# Patient Record
Sex: Female | Born: 1945 | Race: White | Hispanic: No | Smoking: Never smoker
Health system: Southern US, Community
[De-identification: ages and names within clinical notes are randomized; demographics above are authoritative.]

## PROBLEM LIST (undated history)

## (undated) DIAGNOSIS — M810 Age-related osteoporosis without current pathological fracture: Secondary | ICD-10-CM

## (undated) DIAGNOSIS — S2239XA Fracture of one rib, unspecified side, initial encounter for closed fracture: Secondary | ICD-10-CM

## (undated) DIAGNOSIS — N200 Calculus of kidney: Secondary | ICD-10-CM

## (undated) DIAGNOSIS — D649 Anemia, unspecified: Secondary | ICD-10-CM

## (undated) DIAGNOSIS — S82891A Other fracture of right lower leg, initial encounter for closed fracture: Secondary | ICD-10-CM

## (undated) DIAGNOSIS — H8110 Benign paroxysmal vertigo, unspecified ear: Secondary | ICD-10-CM

## (undated) DIAGNOSIS — G8220 Paraplegia, unspecified: Secondary | ICD-10-CM

## (undated) DIAGNOSIS — G35 Multiple sclerosis: Secondary | ICD-10-CM

## (undated) DIAGNOSIS — I341 Nonrheumatic mitral (valve) prolapse: Secondary | ICD-10-CM

## (undated) DIAGNOSIS — S2249XA Multiple fractures of ribs, unspecified side, initial encounter for closed fracture: Secondary | ICD-10-CM

## (undated) DIAGNOSIS — S82892A Other fracture of left lower leg, initial encounter for closed fracture: Secondary | ICD-10-CM

## (undated) DIAGNOSIS — S92919A Unspecified fracture of unspecified toe(s), initial encounter for closed fracture: Secondary | ICD-10-CM

## (undated) DIAGNOSIS — IMO0002 Reserved for concepts with insufficient information to code with codable children: Secondary | ICD-10-CM

## (undated) DIAGNOSIS — Z87442 Personal history of urinary calculi: Secondary | ICD-10-CM

## (undated) DIAGNOSIS — M4802 Spinal stenosis, cervical region: Secondary | ICD-10-CM

## (undated) DIAGNOSIS — E785 Hyperlipidemia, unspecified: Secondary | ICD-10-CM

## (undated) HISTORY — DX: Calculus of kidney: N20.0

## (undated) HISTORY — DX: Fracture of one rib, unspecified side, initial encounter for closed fracture: S22.39XA

## (undated) HISTORY — DX: Other fracture of left lower leg, initial encounter for closed fracture: S82.892A

## (undated) HISTORY — DX: Other fracture of right lower leg, initial encounter for closed fracture: S82.891A

## (undated) HISTORY — DX: Multiple sclerosis: G35

## (undated) HISTORY — PX: OTHER SURGICAL HISTORY: SHX169

## (undated) HISTORY — DX: Reserved for concepts with insufficient information to code with codable children: IMO0002

## (undated) HISTORY — DX: Nonrheumatic mitral (valve) prolapse: I34.1

## (undated) HISTORY — DX: Age-related osteoporosis without current pathological fracture: M81.0

## (undated) HISTORY — DX: Multiple fractures of ribs, unspecified side, initial encounter for closed fracture: S22.49XA

## (undated) HISTORY — DX: Unspecified fracture of unspecified toe(s), initial encounter for closed fracture: S92.919A

---
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
## 1977-12-15 DIAGNOSIS — I341 Nonrheumatic mitral (valve) prolapse: Secondary | ICD-10-CM

## 1977-12-15 HISTORY — DX: Nonrheumatic mitral (valve) prolapse: I34.1

## 1980-12-15 HISTORY — PX: TOTAL ABDOMINAL HYSTERECTOMY W/ BILATERAL SALPINGOOPHORECTOMY: SHX83

## 1981-12-15 HISTORY — PX: SEPTOPLASTY: SUR1290

## 1996-12-15 DIAGNOSIS — S92919A Unspecified fracture of unspecified toe(s), initial encounter for closed fracture: Secondary | ICD-10-CM

## 1996-12-15 HISTORY — DX: Unspecified fracture of unspecified toe(s), initial encounter for closed fracture: S92.919A

## 1998-05-04 ENCOUNTER — Encounter: Payer: Self-pay | Admitting: Family Medicine

## 1998-05-04 ENCOUNTER — Other Ambulatory Visit: Admission: RE | Admit: 1998-05-04 | Discharge: 1998-05-04 | Payer: Self-pay | Admitting: *Deleted

## 1998-05-04 LAB — CONVERTED CEMR LAB: Pap Smear: NORMAL

## 1998-05-07 ENCOUNTER — Encounter: Payer: Self-pay | Admitting: Family Medicine

## 1998-05-07 LAB — CONVERTED CEMR LAB
Blood Glucose, Fasting: 87 mg/dL
TSH: 2.56 microintl units/mL

## 2000-02-27 ENCOUNTER — Encounter: Payer: Self-pay | Admitting: Sports Medicine

## 2000-02-27 ENCOUNTER — Encounter: Admission: RE | Admit: 2000-02-27 | Discharge: 2000-02-27 | Payer: Self-pay | Admitting: Sports Medicine

## 2002-05-25 ENCOUNTER — Encounter: Admission: RE | Admit: 2002-05-25 | Discharge: 2002-05-25 | Payer: Self-pay | Admitting: Family Medicine

## 2002-05-25 ENCOUNTER — Encounter: Payer: Self-pay | Admitting: Family Medicine

## 2002-07-15 ENCOUNTER — Encounter: Payer: Self-pay | Admitting: Family Medicine

## 2002-07-15 LAB — CONVERTED CEMR LAB
Blood Glucose, Fasting: 93 mg/dL
RBC count: 4.68 10*6/uL
TSH: 2.39 microintl units/mL
WBC, blood: 5.7 10*3/uL

## 2002-08-27 ENCOUNTER — Emergency Department (HOSPITAL_COMMUNITY): Admission: EM | Admit: 2002-08-27 | Discharge: 2002-08-27 | Payer: Self-pay | Admitting: Emergency Medicine

## 2002-08-27 ENCOUNTER — Encounter: Payer: Self-pay | Admitting: Emergency Medicine

## 2003-07-03 ENCOUNTER — Encounter: Payer: Self-pay | Admitting: Family Medicine

## 2003-07-03 LAB — CONVERTED CEMR LAB
Blood Glucose, Fasting: 92 mg/dL
RBC count: 4.59 10*6/uL
TSH: 3.12 microintl units/mL
WBC, blood: 5.3 10*3/uL

## 2003-07-05 ENCOUNTER — Encounter: Payer: Self-pay | Admitting: Family Medicine

## 2003-07-05 ENCOUNTER — Other Ambulatory Visit: Admission: RE | Admit: 2003-07-05 | Discharge: 2003-07-05 | Payer: Self-pay | Admitting: Family Medicine

## 2003-07-05 LAB — CONVERTED CEMR LAB: Pap Smear: NORMAL

## 2003-07-12 ENCOUNTER — Encounter: Payer: Self-pay | Admitting: Emergency Medicine

## 2003-07-12 ENCOUNTER — Emergency Department (HOSPITAL_COMMUNITY): Admission: EM | Admit: 2003-07-12 | Discharge: 2003-07-12 | Payer: Self-pay | Admitting: Emergency Medicine

## 2003-07-12 ENCOUNTER — Encounter: Payer: Self-pay | Admitting: Family Medicine

## 2003-07-12 LAB — CONVERTED CEMR LAB: Blood Glucose, Fasting: 89 mg/dL

## 2003-07-14 ENCOUNTER — Encounter: Payer: Self-pay | Admitting: Family Medicine

## 2003-07-14 ENCOUNTER — Encounter: Admission: RE | Admit: 2003-07-14 | Discharge: 2003-07-14 | Payer: Self-pay | Admitting: Family Medicine

## 2003-07-25 LAB — FECAL OCCULT BLOOD, GUAIAC: Fecal Occult Blood: NEGATIVE

## 2004-01-23 ENCOUNTER — Encounter: Payer: Self-pay | Admitting: Family Medicine

## 2004-01-23 LAB — CONVERTED CEMR LAB
RBC count: 4.49 10*6/uL
WBC, blood: 7.7 10*3/uL

## 2005-05-05 ENCOUNTER — Encounter: Admission: RE | Admit: 2005-05-05 | Discharge: 2005-05-05 | Payer: Self-pay | Admitting: Family Medicine

## 2005-05-05 ENCOUNTER — Ambulatory Visit: Payer: Self-pay | Admitting: Family Medicine

## 2005-10-23 ENCOUNTER — Emergency Department (HOSPITAL_COMMUNITY): Admission: EM | Admit: 2005-10-23 | Discharge: 2005-10-24 | Payer: Self-pay | Admitting: Emergency Medicine

## 2005-10-30 ENCOUNTER — Inpatient Hospital Stay (HOSPITAL_COMMUNITY): Admission: EM | Admit: 2005-10-30 | Discharge: 2005-11-05 | Payer: Self-pay | Admitting: Emergency Medicine

## 2005-10-30 ENCOUNTER — Ambulatory Visit: Payer: Self-pay | Admitting: Infectious Diseases

## 2005-11-04 ENCOUNTER — Ambulatory Visit: Payer: Self-pay | Admitting: Internal Medicine

## 2006-07-15 DIAGNOSIS — S82892A Other fracture of left lower leg, initial encounter for closed fracture: Secondary | ICD-10-CM

## 2006-07-15 HISTORY — DX: Other fracture of left lower leg, initial encounter for closed fracture: S82.892A

## 2006-07-19 ENCOUNTER — Emergency Department (HOSPITAL_COMMUNITY): Admission: EM | Admit: 2006-07-19 | Discharge: 2006-07-19 | Payer: Self-pay | Admitting: Family Medicine

## 2006-10-15 ENCOUNTER — Ambulatory Visit: Payer: Self-pay | Admitting: Physical Medicine & Rehabilitation

## 2006-10-15 ENCOUNTER — Encounter
Admission: RE | Admit: 2006-10-15 | Discharge: 2007-01-13 | Payer: Self-pay | Admitting: Physical Medicine & Rehabilitation

## 2006-10-23 ENCOUNTER — Ambulatory Visit: Payer: Self-pay | Admitting: Physical Medicine & Rehabilitation

## 2006-10-30 DIAGNOSIS — S82891A Other fracture of right lower leg, initial encounter for closed fracture: Secondary | ICD-10-CM

## 2006-10-30 HISTORY — DX: Other fracture of right lower leg, initial encounter for closed fracture: S82.891A

## 2006-11-04 ENCOUNTER — Ambulatory Visit: Payer: Self-pay | Admitting: Family Medicine

## 2006-11-04 LAB — CONVERTED CEMR LAB: TSH: 2.53 microintl units/mL

## 2007-02-16 ENCOUNTER — Ambulatory Visit (HOSPITAL_COMMUNITY): Admission: RE | Admit: 2007-02-16 | Discharge: 2007-02-16 | Payer: Self-pay | Admitting: Neurology

## 2007-04-15 HISTORY — PX: LUMBAR DISC SURGERY: SHX700

## 2007-04-23 ENCOUNTER — Inpatient Hospital Stay (HOSPITAL_COMMUNITY): Admission: AD | Admit: 2007-04-23 | Discharge: 2007-04-28 | Payer: Self-pay | Admitting: Neurosurgery

## 2007-04-27 ENCOUNTER — Ambulatory Visit: Payer: Self-pay | Admitting: Vascular Surgery

## 2007-04-27 ENCOUNTER — Ambulatory Visit: Payer: Self-pay | Admitting: Physical Medicine & Rehabilitation

## 2007-04-27 ENCOUNTER — Encounter: Payer: Self-pay | Admitting: Vascular Surgery

## 2007-04-28 ENCOUNTER — Ambulatory Visit: Payer: Self-pay | Admitting: Physical Medicine & Rehabilitation

## 2007-04-28 ENCOUNTER — Inpatient Hospital Stay (HOSPITAL_COMMUNITY)
Admission: RE | Admit: 2007-04-28 | Discharge: 2007-05-12 | Payer: Self-pay | Admitting: Physical Medicine & Rehabilitation

## 2007-05-26 ENCOUNTER — Emergency Department (HOSPITAL_COMMUNITY): Admission: EM | Admit: 2007-05-26 | Discharge: 2007-05-27 | Payer: Self-pay | Admitting: Emergency Medicine

## 2007-06-23 ENCOUNTER — Encounter: Admission: RE | Admit: 2007-06-23 | Discharge: 2007-09-21 | Payer: Self-pay | Admitting: Neurology

## 2007-08-18 ENCOUNTER — Ambulatory Visit: Payer: Self-pay

## 2007-09-01 ENCOUNTER — Ambulatory Visit (HOSPITAL_COMMUNITY): Admission: RE | Admit: 2007-09-01 | Discharge: 2007-09-01 | Payer: Self-pay | Admitting: Neurosurgery

## 2007-09-08 ENCOUNTER — Ambulatory Visit (HOSPITAL_COMMUNITY): Admission: RE | Admit: 2007-09-08 | Discharge: 2007-09-08 | Payer: Self-pay | Admitting: Neurosurgery

## 2007-09-22 ENCOUNTER — Encounter: Admission: RE | Admit: 2007-09-22 | Discharge: 2007-10-28 | Payer: Self-pay | Admitting: Neurology

## 2007-10-12 ENCOUNTER — Ambulatory Visit: Payer: Self-pay | Admitting: Family Medicine

## 2007-10-12 DIAGNOSIS — R5383 Other fatigue: Secondary | ICD-10-CM

## 2007-10-12 DIAGNOSIS — R5381 Other malaise: Secondary | ICD-10-CM | POA: Insufficient documentation

## 2007-10-12 DIAGNOSIS — M25559 Pain in unspecified hip: Secondary | ICD-10-CM | POA: Insufficient documentation

## 2007-10-12 LAB — CONVERTED CEMR LAB
Basophils Absolute: 0.1 10*3/uL (ref 0.0–0.1)
Basophils Relative: 0.9 % (ref 0.0–1.0)
Eosinophils Absolute: 0.1 10*3/uL (ref 0.0–0.6)
Eosinophils Relative: 1.4 % (ref 0.0–5.0)
HCT: 41.5 % (ref 36.0–46.0)
Hemoglobin: 14.3 g/dL (ref 12.0–15.0)
Lymphocytes Relative: 25.5 % (ref 12.0–46.0)
MCHC: 34.3 g/dL (ref 30.0–36.0)
MCV: 91.7 fL (ref 78.0–100.0)
Monocytes Absolute: 0.6 10*3/uL (ref 0.2–0.7)
Monocytes Relative: 8.1 % (ref 3.0–11.0)
Neutro Abs: 4.9 10*3/uL (ref 1.4–7.7)
Neutrophils Relative %: 64.1 % (ref 43.0–77.0)
Platelets: 444 10*3/uL — ABNORMAL HIGH (ref 150–400)
RBC: 4.53 M/uL (ref 3.87–5.11)
RDW: 11.7 % (ref 11.5–14.6)
TSH: 1.75 microintl units/mL (ref 0.35–5.50)
WBC: 7.7 10*3/uL (ref 4.5–10.5)

## 2007-10-13 ENCOUNTER — Telehealth: Payer: Self-pay | Admitting: Family Medicine

## 2007-10-27 ENCOUNTER — Ambulatory Visit: Payer: Self-pay | Admitting: Family Medicine

## 2007-11-16 ENCOUNTER — Ambulatory Visit: Payer: Self-pay | Admitting: Family Medicine

## 2007-11-16 DIAGNOSIS — K6289 Other specified diseases of anus and rectum: Secondary | ICD-10-CM | POA: Insufficient documentation

## 2007-11-29 ENCOUNTER — Encounter: Admission: RE | Admit: 2007-11-29 | Discharge: 2007-11-29 | Payer: Self-pay | Admitting: Orthopedic Surgery

## 2008-05-23 ENCOUNTER — Ambulatory Visit: Payer: Self-pay | Admitting: Family Medicine

## 2008-05-24 ENCOUNTER — Encounter: Payer: Self-pay | Admitting: Family Medicine

## 2008-05-24 DIAGNOSIS — G35 Multiple sclerosis: Secondary | ICD-10-CM | POA: Insufficient documentation

## 2008-05-24 DIAGNOSIS — Z8679 Personal history of other diseases of the circulatory system: Secondary | ICD-10-CM | POA: Insufficient documentation

## 2008-05-25 DIAGNOSIS — E785 Hyperlipidemia, unspecified: Secondary | ICD-10-CM | POA: Insufficient documentation

## 2009-04-14 ENCOUNTER — Emergency Department (HOSPITAL_COMMUNITY): Admission: EM | Admit: 2009-04-14 | Discharge: 2009-04-14 | Payer: Self-pay | Admitting: Emergency Medicine

## 2009-07-24 ENCOUNTER — Encounter: Admission: RE | Admit: 2009-07-24 | Discharge: 2009-07-24 | Payer: Self-pay | Admitting: Neurology

## 2009-11-06 ENCOUNTER — Telehealth: Payer: Self-pay | Admitting: Family Medicine

## 2009-12-15 DIAGNOSIS — IMO0002 Reserved for concepts with insufficient information to code with codable children: Secondary | ICD-10-CM

## 2009-12-15 HISTORY — DX: Reserved for concepts with insufficient information to code with codable children: IMO0002

## 2010-04-16 ENCOUNTER — Ambulatory Visit: Payer: Self-pay | Admitting: Family Medicine

## 2010-04-16 LAB — CONVERTED CEMR LAB
Basophils Relative: 0.5 % (ref 0.0–3.0)
Eosinophils Absolute: 0.1 10*3/uL (ref 0.0–0.7)
Eosinophils Relative: 0.9 % (ref 0.0–5.0)
HCT: 41.7 % (ref 36.0–46.0)
Lymphs Abs: 2 10*3/uL (ref 0.7–4.0)
MCHC: 34.2 g/dL (ref 30.0–36.0)
MCV: 91.9 fL (ref 78.0–100.0)
Monocytes Absolute: 0.4 10*3/uL (ref 0.1–1.0)
Neutrophils Relative %: 64.9 % (ref 43.0–77.0)
Platelets: 396 10*3/uL (ref 150.0–400.0)
WBC: 7.3 10*3/uL (ref 4.5–10.5)

## 2010-05-14 ENCOUNTER — Encounter: Admission: RE | Admit: 2010-05-14 | Discharge: 2010-05-14 | Payer: Self-pay | Admitting: Family Medicine

## 2010-05-14 LAB — HM MAMMOGRAPHY: HM Mammogram: NEGATIVE

## 2010-05-15 ENCOUNTER — Telehealth: Payer: Self-pay | Admitting: Family Medicine

## 2010-06-06 ENCOUNTER — Encounter: Payer: Self-pay | Admitting: Family Medicine

## 2010-06-13 ENCOUNTER — Encounter: Payer: Self-pay | Admitting: Family Medicine

## 2010-06-19 DIAGNOSIS — S22009A Unspecified fracture of unspecified thoracic vertebra, initial encounter for closed fracture: Secondary | ICD-10-CM | POA: Insufficient documentation

## 2010-07-01 ENCOUNTER — Encounter: Payer: Self-pay | Admitting: Family Medicine

## 2010-07-18 ENCOUNTER — Encounter (INDEPENDENT_AMBULATORY_CARE_PROVIDER_SITE_OTHER): Payer: Self-pay | Admitting: *Deleted

## 2010-07-22 ENCOUNTER — Encounter: Payer: Self-pay | Admitting: Family Medicine

## 2010-08-06 ENCOUNTER — Ambulatory Visit: Payer: Self-pay | Admitting: Family Medicine

## 2010-08-06 DIAGNOSIS — N644 Mastodynia: Secondary | ICD-10-CM | POA: Insufficient documentation

## 2010-08-27 ENCOUNTER — Telehealth: Payer: Self-pay | Admitting: Family Medicine

## 2010-09-14 ENCOUNTER — Emergency Department (HOSPITAL_COMMUNITY): Admission: EM | Admit: 2010-09-14 | Discharge: 2010-09-14 | Payer: Self-pay | Admitting: Family Medicine

## 2010-09-17 ENCOUNTER — Encounter: Admission: RE | Admit: 2010-09-17 | Discharge: 2010-09-17 | Payer: Self-pay | Admitting: Sports Medicine

## 2011-01-05 ENCOUNTER — Encounter: Payer: Self-pay | Admitting: Family Medicine

## 2011-01-14 NOTE — Letter (Signed)
Summary: [facility]  [facility]   Imported By: Maryln Gottron 06/19/2010 15:11:37  _____________________________________________________________________  External Attachment:    Type:   Image     Comment:   External Document  Appended Document: [facility]    Clinical Lists Changes  Problems: Added new problem of COMPRESSION FRACTURE, THORACIC VERTEBRA (ICD-805.2) - per Dr. Dion Saucier 2011

## 2011-01-14 NOTE — Letter (Signed)
Summary: [facility]  [facility]   Imported By: Lanelle Bal 07/05/2010 10:56:39  _____________________________________________________________________  External Attachment:    Type:   Image     Comment:   External Document  Appended Document: [facility]    Clinical Lists Changes  Observations: Added new observation of PAST MED HX: compression fx per Dr. Dion Saucier with ortho 2011 (07/05/2010 10:58)       Past History:  Past Medical History: compression fx per Dr. Dion Saucier with ortho 2011

## 2011-01-14 NOTE — Progress Notes (Signed)
Summary: regarding gluten free diet  Phone Note Call from Patient Call back at Home Phone [phone]   Caller: Patient Summary of Call: Pt has MS and eats the food that her neighbor cooks for her, since she is unable to cook for herself.  The neighbor prepares gluten free meals, because that is what her husband has to have.  Pt is asking if this is good for her, with having MS, to only eat gluten free foods.  Dr Neil Crouch, on tv, says no.  Please advise. Initial call taken by: Lowella Petties CMA,  August 27, 2010 11:07 AM  Follow-up for Phone Call        I don't think a gluten free diet would be a problem.  If she had concerns otherwise, I would just take a generic/regular multivitamin daily.  That should cover it.  Follow-up by: Crawford Givens MD,  August 27, 2010 1:38 PM  Additional Follow-up for Phone Call Additional follow up Details #1::        Patient Advised.  Additional Follow-up by: Delilah Shan CMA Duncan Dull),  August 27, 2010 3:04 PM

## 2011-01-14 NOTE — Letter (Signed)
Summary: Dr.Joshual Landau,[facility],Note  Dr.Joshual Landau,[facility],Note   Imported By: Beau Fanny 06/10/2010 11:40:45  _____________________________________________________________________  External Attachment:    Type:   Image     Comment:   External Document

## 2011-01-14 NOTE — Consult Note (Signed)
Summary: [facility]  [facility]   Imported By: Lanelle Bal 07/29/2010 11:12:28  _____________________________________________________________________  External Attachment:    Type:   Image     Comment:   External Document  Appended Document: [facility]    Clinical Lists Changes  Observations: Added new observation of PAST MED HX: compression fx per Dr. Dion Saucier with ortho 2011 MS- prev eval by Dr. Sandria Manly (07/29/2010 13:28)       Past History:  Past Medical History: compression fx per Dr. Dion Saucier with ortho 2011 MS- prev eval by Dr. Sandria Manly

## 2011-01-14 NOTE — Assessment & Plan Note (Signed)
Summary: 30 MIN SEVERAL ISSUES/CLE   Vital Signs:  Patient profile:   65 year old female Temp:     98.2 degrees F oral Pulse rate:   60 / minute Pulse rhythm:   regular BP sitting:   118 / 74  (left arm) Cuff size:   regular  Vitals Entered By: Sydell Axon LPN (Apr 16, 4741 10:51 AM) CC: Wants Mammogram and lab work ordered   History of Present Illness: Pt here with aid for several things after not being here for a while. She is now effectively wheelcahair bound as she had back surgery and then sustained a postsurgical injury to the back being assisted into her wheelchair which has lefty her unable to walk. She is able to weight bear and stand at her sink and in front of her washing machine. She needs a mammo as she hasn't had one in several years. She also would like toget some specific labwork done. Is not interested in knowing her chol profile or kidney/liver fctn. Shwe feels her bloodcount might be low.  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 1 Q Hs 2)  Baclofen 20 Mg  Tabs (Baclofen) .Marland Kitchen.. 1 Qid 3)  Bactroban 2 %  Oint (Mupirocin) .... Apply To Area Two Times A Day 4)  Anusol-Hc 25 Mg  Supp (Hydrocortisone Acetate) .... One Supp Per Recum Tid  Allergies: 1)  ! * Novacaine  Family History: Father: A  68 in [place]   Mother: dec 83 STROKE BROTHER A 75  PERIPH NEUROPATHY CV: + GM MI HBP: NEGATIVE DM: NEGATIVE GOUT/ARTHRITIS: PROSTATE CANCER: GRANDFATHER, + LEUKEMIA// +GF CIRRHOSIS/ CANCER OF THE LIVER BREAST/OVARIAN/UTERINE CANCER: NEGATIVE COLON  CANCER: DEPRESSION: NEGATIVE ETOH/DRUG ABUSE: NEGATIVE OTHER: POSITIVE STROKE OF GF AND GM AND MOTHER  Physical Exam  General:  Well-developed,well-nourished,in no acute distress; alert,appropriate and cooperative throughout examination, in wheelchair. Head:  Normocephalic and atraumatic without obvious abnormalities. No apparent alopecia or balding. Eyes:  Conjunctiva clear bilaterally.  Ears:  External ear exam shows no significant lesions or deformities.  Otoscopic examination reveals clear canals, tympanic membranes are intact bilaterally without bulging, retraction, inflammation or discharge. Hearing is grossly normal bilaterally. Mild cerumen on right. Nose:  External nasal examination shows no deformity or inflammation. Nasal mucosa are pink and moist without lesions or exudates. Mouth:  Oral mucosa and oropharynx without lesions or exudates.  Teeth in good repair. Neck:  No deformities, masses, or tenderness noted. Chest Wall:  No deformities, masses, or tenderness noted. Lungs:  Normal respiratory effort, chest expands symmetrically. Lungs are clear to auscultation, no crackles or wheezes. Heart:  Normal rate and regular rhythm. S1 and S2 normal without gallop, murmur, click, rub or other extra sounds. Abdomen:  Bowel sounds positive,abdomen soft and non-tender without masses, organomegaly or hernias noted.   Impression & Recommendations:  Problem # 1:  OTHER SCREENING MAMMOGRAM (ICD-V76.12) Assessment Unchanged Ordered. Has never had abnormal. Orders: Radiology Referral (Radiology)  Problem # 2:  HYPERLIPIDEMIA (ICD-272.4) Assessment: Unchanged Not interested in checking. Doesn't want to be on medication.  Orders: TLB-TSH (Thyroid Stimulating Hormone) (84443-TSH) Venipuncture (16109)  Problem # 3:  MULTIPLE SCLEROSIS (ICD-340) Assessment: Unchanged Stable if there is such thing.  Problem # 4:  WEAKNESS (ICD-780.79) Assessment: Unchanged R/O Thyrioid disease or  anemia. Orders: TLB-TSH (Thyroid Stimulating Hormone) (84443-TSH) TLB-CBC Platelet - w/Differential (85025-CBCD) Venipuncture (60454)  Complete Medication List: 1)  Nitrofurantoin Macrocrystal 50 Mg Caps (Nitrofurantoin macrocrystal) .... Take one by mouth at bedtime 2)  Baclofen 20 Mg Tabs (Baclofen) .... Take one by mouth three times a day  Patient Instructions: 1)  25 mins spent with pt, half that time in counselling.  Current Allergies (reviewed today): ! * NOVACAINE

## 2011-01-14 NOTE — Assessment & Plan Note (Signed)
Summary: CPX  CYD   Vital Signs:  Patient profile:   65 year old female Height:      63 inches Weight:      125 pounds BMI:     22.22 Temp:     98.3 degrees F oral Pulse rate:   84 / minute Pulse rhythm:   regular BP sitting:   108 / 60  (left arm) Cuff size:   regular  Vitals Entered By: Delilah Shan CMA Canaan Holzer Dull) (August 06, 2010 8:32 AM) CC: To Establish, lab work, L. breast pain   History of Present Illness: Not here for CPE per patietnt.  Asking about breast pain.   L breast pain.  Going on intermittently for months.  Was happening at the time of the last mammogram in 04/2010.  No discharge, no mass noted but patient has not been doing exams.    Fu with Cleophas Dunker pending ZO:[id number].  H/o MS.  Has scooter at home.  Lives alone.  Sees Dr. Sandria Manly with neuro.  Allergies: 1)  ! * Novacaine  Past History:  Past Medical History: compression fx per Dr. Dion Saucier with ortho 2011- will see Dr. Cleophas Dunker NW:GNFA/OZ of bone density MS- prev eval by Dr. Lupita Shutter as of 2011 due to BLE weakness (back pain and MS)  Past Surgical History: Tonsillectomy/Adenoidectomy  1960 HOSP Pyelo Stricture Dilation 1969 TAH BSO for Endomertriosis 1982 Septoplasty due to Deviated Septum 1983 ECHO -- MVP:(1979) MULTIPLE SCLEROSIS :(1982) Fractured 5 toes, 2 ribs 1998 PV EVALUATION (DR. GUPTA) LE- ULTRASOUND:(07/1999) ABD. ULTRASOUND -NORMAL:(05/25/2002) Fractured 2 bones in foot, 2 ribs 08/2002 RIB FRACTURE:(06/2003) EXCHO -MILD MVP /TRACE M.R. TRACE T.R.// TRACE P.R.(:07/17/2003) [facility] RIGHT ANKLE FRACTURE:10/30/2006) ABD. ULTRASOUND NORMAL:(11/01/2005) LEFT ANKLE FRACTURE:(07/2006) MRI LARGE EXTRUTED DISC. / HERNIATION L5/S/1 WITH SEV PERSIST NEGATIVE LE RADIO/:(DR. LOVE) (02/16/2007) [facility] L5/S1 DISKECTOMY AND DECEMPR. :(05/14-05/28/2008)  Family History: Reviewed history from 04/16/2010 and no changes required. Father: A [place]   Mother: dec 83 STROKE BROTHER A 75  PERIPH  NEUROPATHY CV: + GM MI HBP: NEGATIVE DM: NEGATIVE GOUT/ARTHRITIS: PROSTATE CANCER: GRANDFATHER, + LEUKEMIA// +GF CIRRHOSIS/ CANCER OF THE LIVER BREAST/OVARIAN/UTERINE CANCER: NEGATIVE COLON CANCER: DEPRESSION: NEGATIVE ETOH/DRUG ABUSE: NEGATIVE OTHER: POSITIVE STROKE OF GF AND GM AND MOTHER  Social History: Reviewed history from 05/24/2008 and no changes required. Marital Status:WIDOW 1991 Children: 2 CHILDREN, one of whom is in [employer] Occupation: LIVES ALONE  no tob no alcohol played piano  Review of Systems       See HPI.  Otherwise negative.    Physical Exam  General:  A&O in NAD NCAT  MMM RRR CTAB abdominal exam: S/+BS EXT w/o edema diffusely weak in bilateral lower extremities.  Strength symm in bilateral upper extremities.  Sens intact x4 Chaperoned exam of L breast- no mass,discharge, LA in axilla or along clavicle.  she is tender to palpation on medial aspect of breast but I don't feel a mass there.   No skin changes.    Impression & Recommendations:  Problem # 1:  BREAST PAIN, LEFT (ICD-611.71) I d/w patient [id number].  I told here that I cannot give a firm dx.  I offered further work up with repeat mammogram.  She declined but offered to let me know if the pain continued/increase.  She had recent labs drawn at neuro.  We have requested these and will notify her NG:EXBMWUX.  I encouraged her to follow up with Dr. Cleophas Dunker re:her back and bone density.   Complete Medication List: 1)  Nitrofurantoin Macrocrystal  50 Mg Caps (Nitrofurantoin macrocrystal) .... Take one by mouth at bedtime 2)  Baclofen 20 Mg Tabs (Baclofen) .... Take one by mouth three times a day 3)  Halcion 0.25 Mg Tabs (Triazolam) .... One tab by mouth at night as needed insomnia 4)  Oxycodone-acetaminophen 10-325 Mg Tabs (Oxycodone-acetaminophen) .Marland Kitchen.. 1 by mouth three times a day as needed pain 5)  Promethazine Hcl 25 Mg Tabs (Promethazine hcl) .Marland Kitchen.. 1 by mouth three times a day as needed for  nav  Patient Instructions: 1)  Please schedule a follow-up appointment as needed.  Let me know if you notice anything else or if the pain gets worse.  Take care.   Current Allergies (reviewed today): ! * NOVACAINE

## 2011-01-14 NOTE — Letter (Signed)
Summary: Nadara Eaton letter  [place] [facility]  [address] [place], [place] [address]   Phone: [phone]  Fax: [phone]       07/18/2010 MRN: [id number]  [facility] [address] [place], [place]  [address]  Dear Ms. [facility] and [facility] announce the retirement of Arta Silence, M.D., from full-time practice at the [facility] office effective June 13, 2010 and his plans of returning part-time.  It is important to Dr. Hetty Ely and to our practice that you understand that [facility] has seven physicians in our office for your health care needs.  We will continue to offer the same exceptional care that you have today.    Dr. Hetty Ely has spoken to many of you about his plans for retirement and returning part-time in the fall.   We will continue to work with you through the transition to schedule appointments for you in the office and meet the high standards that [facility] is committed to.   Again, it is with great pleasure that we share the news that Dr. Hetty Ely will return to [facility] at [facility] in October of 2011 with a reduced schedule.    If you have any questions, or would like to request an appointment with one of our physicians, please call us at [phone] and press the option for Scheduling an appointment.  We take pleasure in providing you with excellent patient care and look forward to seeing you at your next office visit.  Our [facility] Physicians are:  Tillman Abide, M.D. Laurita Quint, M.D. Roxy Manns, M.D. Kerby Nora, M.D. Hannah Beat, M.D. Ruthe Mannan, M.D. We proudly welcomed Raechel Ache, M.D. and Eustaquio Boyden, M.D. to the practice in July/August 2011.  Sincerely,  [facility]

## 2011-01-14 NOTE — Progress Notes (Signed)
Summary: wants halcion for sleep  Phone Note Call from Patient Call back at Home Phone [phone]   Caller: Patient Call For: Shaune Leeks MD Summary of Call: Pt states she had halcion years ago and this helped her sleep.  She is having problems sleeping now and would like to try this again.  Uses [facility].  She says she tried some other sleep med recently and they didnt help.  This can be addressed tomorrow. Initial call taken by: Lowella Petties CMA,  May 15, 2010 4:43 PM  Follow-up for Phone Call        I do not routinely use Halcion because of the poss side effects which, tho rare, are nothing to sneeze at. Pls have the pt research Halcion before getting the script filled. It is for SHORT TERM (7-10days duration, max one month) trmt of insomnia. Thank you. Follow-up by: Shaune Leeks MD,  May 15, 2010 4:56 PM    New/Updated Medications: HALCION 0.25 MG TABS (TRIAZOLAM) one tab by mouth at night as needed insomnia Prescriptions: HALCION 0.25 MG TABS (TRIAZOLAM) one tab by mouth at night as needed insomnia  #30 x 0   Entered and Authorized by:   Shaune Leeks MD   Signed by:   Shaune Leeks MD on 05/15/2010   Method used:   Print then Give to Patient   RxID:   [id number]   Appended Document: wants halcion for sleep Advised pt, medicine called to [facility].

## 2011-03-04 ENCOUNTER — Telehealth: Payer: Self-pay | Admitting: *Deleted

## 2011-03-05 NOTE — Telephone Encounter (Signed)
Entered in error

## 2011-03-06 ENCOUNTER — Telehealth: Payer: Self-pay | Admitting: *Deleted

## 2011-03-06 NOTE — Telephone Encounter (Signed)
Be glad to take her back.

## 2011-03-06 NOTE — Telephone Encounter (Signed)
Pt has called requesting to continue on as your patient.  She says she switched because she thought you were going to work at another practice and wouldn't be back here.  Please advise.

## 2011-03-29 ENCOUNTER — Encounter: Payer: Self-pay | Admitting: Family Medicine

## 2011-04-02 ENCOUNTER — Encounter: Payer: Self-pay | Admitting: Family Medicine

## 2011-04-02 ENCOUNTER — Ambulatory Visit (INDEPENDENT_AMBULATORY_CARE_PROVIDER_SITE_OTHER): Payer: Medicare Other | Admitting: Family Medicine

## 2011-04-02 DIAGNOSIS — R071 Chest pain on breathing: Secondary | ICD-10-CM

## 2011-04-02 DIAGNOSIS — R0789 Other chest pain: Secondary | ICD-10-CM | POA: Insufficient documentation

## 2011-04-02 NOTE — Patient Instructions (Signed)
Use heat and ice as discussed and add Aleve 2 after brfst and supper for approx one month if needed.

## 2011-04-02 NOTE — Progress Notes (Signed)
  Subjective:    Patient ID: [name], female    DOB: 05/29/1946, 65 y.o.   MRN: [id number]  HPI Pt here for pain in the left breast. It is sore to touch. She had mammo last 10/4 and it was sore occas then but now is all the time. She has pain from the mid to anterior axilla on the left approx T4 lvl around to the medial side of the left breast at a level consistent with T5 anteriorally. She remembers no trauma or other problem but had had 'a broken" back, approx the T5 level. She denies pain on the right or any other level. She has not strained or otherwise fallen. She has MS and is essentially wheelchair bound.    Review of SystemsNoncontributory except as above.       Objective:   Physical Exam WD Thin WF NAD. Lungs CTA Heart RRR Chest wall tender to touch ess approx T5 level from post axill line around anteriorally to the medial expanse of the left breast. No overt swelling or erythema noted.        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

## 2011-04-02 NOTE — Assessment & Plan Note (Signed)
Presume this from complications of her "broken back" which reportedly has now healed as she has had this since approximately the same time frame.  Suggested heat and ice and add Aleve after meals if not adequately handled with H&I.

## 2011-04-08 ENCOUNTER — Other Ambulatory Visit: Payer: Self-pay | Admitting: Family Medicine

## 2011-04-08 MED ORDER — TRIMETHOPRIM 100 MG PO TABS: 100.0000 mg | ORAL_TABLET | Freq: Every day | ORAL | Status: AC

## 2011-04-22 ENCOUNTER — Telehealth: Payer: Self-pay | Admitting: *Deleted

## 2011-04-22 DIAGNOSIS — N644 Mastodynia: Secondary | ICD-10-CM

## 2011-04-22 NOTE — Telephone Encounter (Signed)
Patient says that she needs order for mammogram faxed to the breast center for her mammogram. She says that she told them she has been having pain in her breast

## 2011-04-22 NOTE — Telephone Encounter (Signed)
So since she is having pain in her breast, it can not be just a screening diagnosis.

## 2011-04-28 NOTE — Telephone Encounter (Signed)
MMG set up ath the [facility] on 05/20/2011 at 10:50am. [facility]

## 2011-04-29 NOTE — Discharge Summary (Signed)
[name], [name] NO.:  [id number]   MEDICAL RECORD NO.:  [id number]          PATIENT TYPE:  INP   LOCATION:  5151                         FACILITY:  [facility]   PHYSICIAN:  Hilda Lias, M.D.   DATE OF BIRTH:  [birth year]   DATE OF ADMISSION:  04/23/2007  DATE OF DISCHARGE:  04/28/2007                               DISCHARGE SUMMARY   ADMISSION DIAGNOSES:  1. L5-1 herniated disc.  2. Multiple sclerosis.  3. Fracture of both ankles.   FINAL DIAGNOSES:  1. L5-1 herniated disc.  2. Multiple sclerosis.  3. Fracture of both ankles.   CLINICAL HISTORY:  Patient was admitted because after she fell, she  developed back and leg pain.  She also had fracture of both ankles.  X-  rays show large herniated disc at L5-1.  She got a second opinion and  then she wants to proceed with surgery.  Laboratory was normal.   HOSPITAL COURSE:  The patient was taken to surgery and an L5-1  discectomy was done.  The patient did well but after 72 hours she  developed some tenderness in both legs.  DVT work up was negative.  Today she is feeling much better.  She lives by herself and  because of  history of foot fracture as well as multiple sclerosis, she is going to  be transferred to the rehab unit.   CONDITION ON DISCHARGE:  Improved.   MEDICATIONS:  She will continue her same pain medication as in the  hospital.   DIET:  Regular.   ACTIVITY:  It will be up to rehab.   FOLLOWUP:  I will follow her while she is in the rehab otherwise I will  see her in my office in 4-6 weeks.           ______________________________  Hilda Lias, M.D.     EB/MEDQ  D:  04/28/2007  T:  04/28/2007  Job:  [id number]

## 2011-04-29 NOTE — H&P (Signed)
[name], [name] NO.:  [id number]   MEDICAL RECORD NO.:  [id number]          PATIENT TYPE:  IPS   LOCATION:  4009                         FACILITY:  [facility]   PHYSICIAN:  Ellwood Dense, M.D.   DATE OF BIRTH:  1946-01-05   DATE OF ADMISSION:  04/28/2007  DATE OF DISCHARGE:                              HISTORY & PHYSICAL   Primary care physician:  Arta Silence, MD, at [facility].  Neurosurgeon:  Hilda Lias, MD.  Neurologist:  Genene Churn. Love, MD.   HISTORY OF PRESENT ILLNESS:  Ms. Andrus is a 65 year old female with  history of multiple sclerosis since 1982 along with bilateral ankle  fractures and most recent right ankle fracture in 2006.  She was  discharged to [facility], where she stayed for 1 month's  time.   The patient experienced multiple falls and presented Apr 23, 2007, with  radiating low back pain for several months' duration.  X-rays reportedly  showed a herniated disk at L5-S1.  She underwent an L5-S1 diskectomy and  foraminotomy along with decompression of the L5-S1 nerve roots Apr 23, 2007, performed by Dr. Jeral Fruit.  She has had chronic pain prior to this  admission but refused to take pain medicines.  She was started on subcu  Lovenox for DVT prophylaxis with a venous duplex study being negative  Apr 27, 2007.  She has been placed on a slow Decadron taper.  Pain  control has been using Neurontin and Lyrica.   The patient was evaluated by the rehabilitation physicians and felt to  be an appropriate candidate for inpatient rehabilitation.   REVIEW OF SYSTEMS:  Positive for incontinence, reflux, lumbago, and  weakness.   PAST MEDICAL HISTORY:  1. History of multiple sclerosis diagnosed in 1982 with neurogenic      bladder but no ongoing treatment.  2. Bilateral ankle fractures.  3. Prior hysterectomy.  4. Prior nasal surgery.  5. Prior tonsillectomy.   FAMILY HISTORY:  Positive for coronary artery disease.   SOCIAL  HISTORY:  The patient lives with her daughter and a 79 year old  tenant.  The daughter has schizophrenia and is of limited assistance.  There is not much help from the tenant available.  Her son is in the  area but works.  The patient lives in a 1-level home with a ramp to  enter.  She does not use alcohol or tobacco.   FUNCTIONAL HISTORY PRIOR TO ADMISSION:  Rather sedentary, using either a  walker or a transfer wheelchair, with bilateral ankle stirrup orthoses.   ALLERGIES:  NOVOCAIN.   MEDICATIONS PRIOR TO ADMISSION:  1. Multivitamin daily.  2. Caltrate daily.  3. Macrodantin 50 mg p.o. q.h.s.   LABORATORY DATA:  Recent hemoglobin was 11.6, hematocrit of 34.6,  platelet count of 380,000, and white count of 10.4.  recent BUN was 8  with creatinine of 0.5.  A full basic metabolic panel was not done.   PHYSICAL EXAMINATION:  GENERAL:  A reasonably well-appearing middle-aged  adult female lying in bed, in no acute discomfort.  VITAL SIGNS:  Blood pressure 90/52  with pulse of 53, respiratory rate  18, and temperature 98.0.  HEENT:  Normocephalic, atraumatic.  CARDIOVASCULAR:  Regular rate and rhythm, S1, S2, without murmurs.  ABDOMEN:  Soft, nontender, with positive bowel sounds.  LUNGS:  Clear to auscultation bilaterally.  NEUROLOGIC:  Alert and oriented x3.  Cranial nerves II-XII are intact.  Bilateral upper extremity exam showed 4+/5 strength throughout.  Bulk  and tone were normal.  Reflexes were 2+ and symmetrical.  Bilateral  lower extremity exam showed hip flexion and knee extension at 3-/5 to  3+/5.  Sensation was slightly decreased to light touch in the bilateral  lower extremities.  Reflexes were 1+ at the bilateral knees and ankles.  BACK:  Examination of her back showed Steri-Strips in place without  significant drainage.   IMPRESSION:  1. Status post L5-S1 diskectomy/foraminotomy and decompression for      herniated disk with myelopathy.  2. History of multiple  sclerosis with neurogenic bladder, limited      ambulation.  3. History of chronic left leg pain/back pain.   Presently the patient has deficits in ADLs, transfers, and ambulation  secondary to the above-noted disk herniation and subsequent  decompression surgery.   PLAN:  1. Admit to the rehabilitation unit for daily therapies to include      physical therapy for range of motion, strengthening, bed mobility,      transfers, pre-gait training, gait training, and equipment      evaluation.  2. Occupational therapy for range of motion, strengthening, ADLs,      cognitive/perceptual training, splinting, and equipment evaluation.  3. Rehab nursing for skin care, wound care, and bowel and bladder      training as necessary.  4. Case management to assess home environment, assist with discharge      planning, and arrange for appropriate follow-up care.  5. Social work to assess family and social support, consultation      regarding disability issues as necessary, and assist in discharge      planning.  6. Check initial labs including CBC and CMET Thursday, Apr 29, 2007.  7. Regular diet.  8. Vitals b.i.d.  9. Subcu Lovenox 30 mg q.12h. for DVT prophylaxis.  10.Continue Neurontin 300 mg p.o. t.i.d. and Lyrica 50 mg p.o. b.i.d.      for pain control along with oxycodone 5 mg one to two tablets p.o.      q.4-6h. p.r.n.  11.Continue Macrodantin 50 mg q.h.s. for urinary tract infection      prophylaxis/suppression.  12.Senokot S two tablets p.o. q.h.s.  13.Multivitamin one p.o. daily.  14.Old EKG to chart.  15.Discontinue heparin lock if not already done.  16.Routine turning to prevent skin breakdown.  17.Elevate heels off the bed to prevent heel ulcers.  18.Dulcolax suppository one per rectum daily p.r.n.   PROGNOSIS:  Fair/good.   ESTIMATED LENGTH OF STAY:  10-15 days.   GOALS:  Modified independent, ADLs and transfers, with modified independent to minimum assist for short-distance  ambulation using a  device.           ______________________________  Ellwood Dense, M.D.     DC/MEDQ  D:  04/28/2007  T:  04/28/2007  Job:  [id number]

## 2011-04-29 NOTE — Op Note (Signed)
[name], [name]              ACCOUNT NO.:  [id number]   MEDICAL RECORD NO.:  [id number]          PATIENT TYPE:  OIB   LOCATION:  5151                         FACILITY:  [facility]   PHYSICIAN:  Hilda Lias, M.D.   DATE OF BIRTH:  10/31/1946   DATE OF PROCEDURE:  04/23/2007  DATE OF DISCHARGE:                               OPERATIVE REPORT   PREOPERATIVE DIAGNOSIS:  Left L5-S1 herniated disc, multiple sclerosis.   POSTOPERATIVE DIAGNOSIS:  Left L5-S1 herniated disc, multiple sclerosis.   PROCEDURE:  Left L5-S1 discectomy, foraminotomy, and decompression of  the L5-S1 nerve root, microscope.   SURGEON:  Hilda Lias, M.D.   ASSISTANT:  Danae Orleans. Venetia Maxon, M.D.   CLINICAL HISTORY:  The patient was admitted because of back and left leg  pain.  This problem had been going from least five months and getting  worse.  MRI showed a large herniated disc at the level of L5-S1.  She  got a second opinion who advised surgery.   DESCRIPTION OF PROCEDURE:  The patient was taken to the OR and she was  positioned in a prone manner.  The skin was cleaned with DuraPrep.  A  midline incision from L5 to S1 was made and muscles were retracted  laterally. With the microscope and with the drill, we drilled the lower  lamina of L5 and the upper of S1.  The yellow ligament was also excised.  Immediately we found that the thecal sac as well as the S1 nerve root  was displaced posterior and medially. There was a large herniated disc.  It was difficult to mobilize the S1 nerve root.  Incision in the disc  was made and using the Kerrison punch as well as the pituitary rongeur,  we entered disc space and slowly we were able to do a total gross  discectomy with removal of a large fragment going into the body of S1.  At the end, we had good decompression of the L5 and S1 nerve roots.  Valsalva maneuver was negative.  Then, fentanyl and Depo-Medrol were  left in the epidural space and the wound was closed  with Vicryl and  Steri-Strips.           ______________________________  Hilda Lias, M.D.     EB/MEDQ  D:  04/23/2007  T:  04/24/2007  Job:  [id number]   cc:   Genene Churn. Love, M.D.

## 2011-04-29 NOTE — Discharge Summary (Signed)
[name], [name] NO.:  [id number]   MEDICAL RECORD NO.:  [id number]          PATIENT TYPE:  IPS   LOCATION:  4009                         FACILITY:  [facility]   PHYSICIAN:  Ellwood Dense, M.D.   DATE OF BIRTH:  May 10, 1946   DATE OF ADMISSION:  04/28/2007  DATE OF DISCHARGE:  05/12/2007                               DISCHARGE SUMMARY   DISCHARGE DIAGNOSES:  1. Lumbar L5-S1 herniated disk status post lumbar L5-S1 diskectomy and      decompression Apr 23, 2007.  2. Pain control.  3. Multiple sclerosis with neurogenic bladder.  4. History of bilateral ankle fractures.  5. Subcutaneous Lovenox for deep vein thrombosis prophylaxis.   HISTORY OF PRESENT ILLNESS:  This is a 65 year old female with history  of multiple sclerosis and bilateral ankle fractures admitted after  multiple falls.  She presented on Apr 23, 2007 with radiating low back  pain.  X-rays and imaging showed lumbar L5-S1 herniated disk.  She  underwent L5-S1 diskectomy with decompression on Apr 23, 2007 per Dr.  Jeral Fruit.  Subcutaneous Lovenox for deep vein thrombosis prophylaxis.  Venous Doppler studies May 13 were negative.  She did complete a slow  Decadron taper.  She was admitted for comprehensive rehab program.   PAST MEDICAL HISTORY:  See Discharge Diagnoses.   HABITS:  No alcohol or tobacco.   ALLERGIES:  NOVOCAIN.   SOCIAL HISTORY:  Lives with daughter who has schizophrenia as well as a  65 year old tenant with limited assistance.  Son in the area works.  They live in a one-level home with a ramp.   MEDICATIONS PRIOR TO ADMISSION:  Multivitamin, Caltrate daily and  Macrobid 50 mg at bedtime.   REHABILITATION HOSPITAL COURSE:  The patient was admitted to Inpatient  Rehab Services with therapies initiated on a 3-hour daily basis  consisting of physical therapy, occupational therapy and rehabilitation  nursing.  The following issues were addressed during the patient's  rehabilitation  stay.   Pertaining to [name]'s lumbar L5-L1 herniated disk, she had  undergone diskectomy with decompression on Apr 23, 2007 per Dr. Jeral Fruit.  Surgical site is healing nicely.  No signs of infection.   Pain control ongoing with the use of Neurontin as well as Lyrica which  was increased to 75 mg 3 times daily.  She was using oxycodone for  breakthrough pain.   She remained on subcutaneous Lovenox for deep vein thrombosis  prophylaxis throughout her rehab course.  She has a documented history  of multiple sclerosis followed by Dr. Sandria Manly.  She had refused any  medication for her multiple sclerosis in the past.   Neurogenic bladder with routine toileting noted.  She was supervision  with upper body dressing, minimal assist for lower body.  She was  minimal assist for bed mobility.  Total assist stand.  She will be  discharged to home with ongoing home therapies.   Blood pressures were monitored without the use of antihypertensive  medications.   Latest labs showed a sodium 139, potassium 4.1, BUN 17, creatinine 0.7,  hemoglobin 12.4, and hematocrit 36.7.  DISCHARGE MEDICATIONS:  At the time of dictation include a multivitamin  daily; Macrodantin 50 mg at bedtime; Neurontin 300 mg b.i.d.; Lyrica 75  mg t.i.d.; Tofranil  25 mg b.i.d.; oxycodone immediate release 5 mg 1 or  2 tablets every 4 hours as needed for pain, dispense 60 tablets.   ACTIVITY:  As tolerated.   DIET:  Regular.   SPECIAL INSTRUCTIONS:  Continue therapies as advised per [employer].  Follow up with Dr. Jeral Fruit of Neurosurgery, [phone], as well as Dr.  Hetty Ely for medical management.  much      Mariam Dollar, P.A.    ______________________________  Ellwood Dense, M.D.    DA/MEDQ  D:  05/11/2007  T:  05/11/2007  Job:  [id number]   cc:   Ellwood Dense, M.D.  Hilda Lias, M.D.  Arta Silence, MD  Genene Churn. Love, M.D.

## 2011-04-29 NOTE — H&P (Signed)
NAMEARNELL, Collins NO.:  [id number]   MEDICAL RECORD NO.:  [id number]          PATIENT TYPE:  OIB   LOCATION:  5151                         FACILITY:  [facility]   PHYSICIAN:  Hilda Lias, M.D.   DATE OF BIRTH:  May 01, 1946   DATE OF ADMISSION:  04/23/2007  DATE OF DISCHARGE:                              HISTORY & PHYSICAL   [name] who has a history of multiple sclerosis and back in October  of 2007, she fell and ended up having fracture of both feet.  Since  December, she has been complaining of back pain with radiation down to  the left leg, all the way down to the left foot and every time she sits  she feels like radiation is going to the left leg.  She denies any pain  to the right leg.  She came to my office in a wheelchair.  After I saw  her, I offered back surgery.  She had a second opinion about orthopedic  surgery who fully agreed with surgery.  She is being admitted for a left  L5-1 discectomy.   PAST MEDICAL HISTORY:  1. Tonsillectomy.  2. Hysterectomy.  3. Kidney surgery.   SHE IS ALLERGIC TO NOVOCAIN.   SOCIAL HISTORY:  Negative.   FAMILY HISTORY:  Unremarkable.   REVIEW OF SYSTEMS:  Is positive for multiple sclerosis, UTI,  incontinence, neck pain, anemia, chest pain, osteoporosis.   PHYSICAL EXAMINATION:  GENERAL:  Patient came to my office and she came  in a wheelchair.  HEENT:  Normal.  NECK:  Normal.  LUNGS:  Clear.  HEART:  Sounds normal.  ABDOMEN:  Normal.  EXTREMITIES:  There is a splint in both the legs.  There is edema grade  2.  NEURO:  Mental is without trauma.  She has some pain in the left leg up  to the point when the straight leg raising is possibly about 30 degrees.  She is hyperreflexive with clonus on the left side, most likely to the  multiple sclerosis.   The MRI showed that she has a large herniated disc of the L5-1 center  and to the left.   CLINICAL IMPRESSION:  1. Left L5-S1 herniated disc.  2.  Fracture of both ankles.  3. Multiple sclerosis.   RECOMMENDATIONS:  The patient is being admitted for surgery.  The  procedure was explained to her in my office and it would be L5-S1  discectomy.  She knows about the risks, such as infection, CSF leak,  recurrence of the problem, no improvement whatsoever, need for further  surgery, damage to the vessel of the abdomen and all the risks  associated with her multiple sclerosis.           ______________________________  Hilda Lias, M.D.     EB/MEDQ  D:  04/23/2007  T:  04/24/2007  Job:  [id number]

## 2011-05-02 NOTE — Assessment & Plan Note (Signed)
[name] returns to clinic today for followup evaluation. She does come  back to the clinic today with her rolling walker along with the ankle-foot  orthosis that we had discussed previously but did not have present for  evaluation.   The patient continues to wear the ASO on her right ankle especially when she  is outside the home. She reports that inside the home she frequently goes  without the brace. She has walked as much as 60 feet in the recent past but  for the most part in the past few months she has been mostly wheelchair-  bound.   In the office today we did evaluate the patient's walking using the ASO and  another time using the ankle-foot orthosis, and another type using neither  device. She certainly has external rotation when she places the ankle-foot  orthosis on her lower extremity. She has a flat-footed gait whether she is  using any of the devices, but seems to externally rotate when she has the  ankle-foot orthosis in place. That seems to give her less left lateral  support and make it more likely that she would suffer a fracture if she were  to fall in the future. She also reports that she has weakness of her legs  that is caused by her MS. She reports that in the past when she has been  walking there has been no give-away in the ankle but that her whole entire  leg has given way and she has fallen. That continues to be a problem that we  are not going to be able to address with any bracing.   MEDICATIONS:  None.   PHYSICAL EXAMINATION:  Well-appearing, middle-aged elderly adult female in  mild to no acute discomfort. Blood pressure 112/68 with pulse 62,  respiratory rate 16 and O2 saturation 99% on room air. The patient has a  flat-footed gait with no significant toe drag when she is without any brace  or when she uses the ASO brace. Her gait is essentially the same with either  of those but the ASO does provide her with some medial and lateral support.  I  would recommend that be used for any longer distance ambulation.   In terms of the ankle-foot orthosis I do not find any problem with the  manufacturing of that brace. For some reason she seems to externally rotate  when she has that brace on and it does not give her much medial or lateral  support. She does not have significant toe to heel motion with or without  that brace and I think the brace is probably not going to give [place] any  advantage and unfortunately gives [place] several disadvantages. It also causes  her some pain, she reports, on the under surface of her foot.   IMPRESSION:  1. Status post bilateral bimalleolar ankle fractures with healing of the      right fracture and ongoing healing of the left fracture.  2. History of multiple sclerosis with significant lower extremity weakness      along with upper extremity weakness.   PLAN:  At the present time we have discussed the 2 braces along with  ambulation without any brace at all. At this point she remains at risk for  falls both secondary to her ankle situation and the ongoing weakness that  she has more proximally in her right leg. My recommendation is that if she  is up doing any walking that she definitely have  the ASO brace on. If she is  walking more than a few steps I think she needs someone that can give her  support, and that does not appear to be her daughter at the present time.  Daughter seems to have some health issues herself. Otherwise if she is not  having physical assistance I think she needs to limit her weightbearing and  be very safe with her limited distance ambulation using that ASO brace. I do  not feel there are any significant advantages using the ankle-foot orthosis  although I think it is made properly. It just does not seem to give her the  lateral and medial support and seems to be more painful for her in addition  to the external rotation that she develops when she places it on her leg.   I hope  this information is helpful to you in this patient's case. She seemed  satisfied with the answers supplied in the office today.           ______________________________  Ellwood Dense, M.D.     DC/MedQ  D:  10/26/2006 14:31:32  T:  10/26/2006 15:18:20  Job #:  [id number]

## 2011-05-02 NOTE — Consult Note (Signed)
REFERRING PHYSICIAN:  Molly Maduro A. Thurston Hole, M.D.   PURPOSE OF EVALUATION:  Evaluate and treat left healing ankle fracture.   HISTORY OF PRESENT ILLNESS:  [name] is a 65 year old adult female  referred to this office by Dr. Sherene Sires office for evaluation of orthotic  device for her left ankle.  Medical records accompany the patient and those  were reviewed prior to this office visit.  It appears that the patient  suffered a right ankle injury on October 22, 2005, when she let go of her  walker and fell at her home.  She was subsequently found to have a  bimalleolar ankle fracture on the right side.  She is unable to bear weight  secondary to her weakness in her upper extremities related to multiple  sclerosis.  She was at the [facility] during healing between November and  December 2006.  No surgery was done and she was placed in a Cam walker and  allowed eventual weightbearing and eventually the use of an air cast.  She  did not use an ankle foot arthrosis but instead used an ASO on the right  ankle and that eventually healed.  She returned to see Dr. Thurston Hole in  February 2007, and was doing well and continued in therapy at that time.   The patient subsequently fell on July 19, 2006, at her home when she  tripped in the bathroom and her legs gave out on her.  She suffered a left  ankle injury which was specifically a left bimalleolar ankle fracture which  was nondisplaced.  She was again placed in a Cam walker but remained home at  this time with family providing assistance.  She eventually was put in a Cam  walker and allowed partial weightbearing.  She was treated with antibiotics  for some slight cellulitis and that eventually resolved.   On September 08, 2006, the patient was switched to an ASO brace for her left  foot and she reported pain over the top of her left foot while using an  ankle foot arthrosis.  The patient reports that she is tolerating the ASO  much better than  she was the AFO.  She reports that she is allowed at least  50% partial weightbearing on the left ankle and she is using a walker for  ambulation mostly with supervision to contact guard assistance and therapy  and occasionally with her son at home.  She also reports that she takes 3-4  steps occasionally with the walker without any physical assistance.  She  understands that that has not been the recommendation.  She continues to  attend outpatient therapy at Dr. Sherene Sires office two times per week.   Apparently the referral to this office is to determine if the ankle brace  that she is using presently specifically the ASO is sufficient for her or if  we should seek a different type of ankle foot arthrosis.   PAST MEDICAL HISTORY:  1. History of multiple sclerosis diagnosed in 1982.  2. History of recurrent bladder infection.   ALLERGIES:  NOVOCAIN.   FAMILY HISTORY:  Her mother is deceased from a stroke and a brother has a  peripheral nerve damage.  Her father is age 70 and healthy.   SOCIAL HISTORY:  The patient is widowed and has 2 grown children, one a  daughter along with an elderly female who live with the patient at the  present time.  Neither the daughter nor the elderly female can  assist her  when she is walking and she reports that she has a fear of falling now  secondary to leg and knee weakness.  She reports using a rolling walker at  home with partial weightbearing and usually walks only with her son at the  home.  She does walk with therapy short distances.  She does not use alcohol  or tobacco and reports that she has a ramp in place at home.   MEDICATIONS:  None.   REVIEW OF SYSTEMS:  Positive for weight loss along with constipation and  urine retention.   PHYSICAL EXAMINATION:  GENERAL:  Well-appearing middle-aged elderly adult  female seated in a manual wheelchair.  VITAL SIGNS:  Blood pressure 112/67, with a pulse of 65, respiratory rate  16, and O2 saturation  98% on room air.  EXTREMITIES:  Examination of her left lower extremity  shows that she has an  ASO brace in place.  That brace was removed.  She has minimal redness of the  lateral aspect of her left ankle.  She has ankle dorsiflexion of  approximately 3/5 and ankle dorsiflexion of 3+/5.  Ankle range of motion was  slightly decreased in all directions.  She did not complain of any pain with  her ankle range of motion.  Lower extremity strength was generally 3-/5 in  hip flexion and knee extension on the left side.   IMPRESSION:  1. Status post bilateral bimalleolar ankle fractures with healing of the      right fracture and ongoing healing of the left fracture.  2. History of multiple sclerosis with significant lower extremity weakness      along with upper extremity weakness.   Presently, the patient still remains at risk for falls secondary to her  extreme lower extremity weakness related to the multiple sclerosis.  At this  time, I think she does not tolerate and would not benefit from an ankle foot  arthrosis.  Unfortunately, we were unable to see her actually walk in the  office today as we do not have a rolling walker with hand brakes and she  reports that is what she uses at home.  That walker that she has used at  home was left in the car and she did not volunteer to get it out of the car  to bring it to the appointment.  I think at the present time, use of the ASO  brace is adequate for this individual.  She still remains at risk for falls  in the future secondary to the persistent lower extremity weakness, related  to her MS.  She is willing to at least try walking in the future especially  with continued therapy.  I am hopeful that she would be a safe ambulator at  least with contact guard assistance for some period of time in the future.  She still is at significant risk secondary to the osteoporosis, but she is not willing to take any medicines to try to strengthen her  bones.  She is  concerned about development of kidney stones.   We will plan on seeing the patient in followup on an as-needed basis.  She  will be continuing with the ASO brace on her left lower extremity and will  be following up with Dr. Thurston Hole and continuing therapy.           ______________________________  [name], M.D.     DC/MedQ  D:10/16/2006 11:09:02  T:10/16/2006 16:42:58  Job #:  [id number]

## 2011-05-02 NOTE — Discharge Summary (Signed)
NAMEBRITANIA, [name]              ACCOUNT NO.:  [id number]   MEDICAL RECORD NO.:  [id number]          PATIENT TYPE:  INP   LOCATION:  5017                         FACILITY:  [facility]   PHYSICIAN:  Elana Alm. Thurston Collins, M.D. DATE OF BIRTH:  [birth year]   DATE OF ADMISSION:  10/30/2005  DATE OF DISCHARGE:                                 DISCHARGE SUMMARY   ADMITTING DIAGNOSES:  1.  Right ankle fracture with fracture blisters and cellulitis.  2.  Multiple sclerosis.  3.  Chronic urinary tract infections with an acute urinary tract infection.  4.  Urinary incontinence.   DISCHARGE DIAGNOSES:  1.  Right ankle fracture being treated closed, with fracture blisters      getting daily dressing changes.  2.  Multiple sclerosis.  3.  Urinary tract infection - Klebsiella pneumoniae.  4.  Urinary incontinence.   HISTORY OF PRESENT ILLNESS:  The patient is a 65 year old white female with  a history of MS, chronic UTIs, urinary incontinence, a coccyx fracture, who  fell Wednesday, October 22, 2005, suffering a right ankle fracture,  bimalleolar. This was treated closed due to her MS and significant  osteoporosis.   She returned to the office 1 week status post fracture with a significant  fracture blister, significant distal edema, redness about her foot and  ankle, and inability to take care of herself due to her limited  weightbearing status due to her fracture. She was admitted to [facility],  placed on IV antibiotics for cellulitis of her foot and fracture blisters.  Internal medicine was consulted due to her increased liver functions and  nutrition consult was ordered due to her decreased albumin.   HOSPITAL COURSE:  Hospital day #2, the patient's vital signs were stable,  she was afebrile, hemoglobin was 12.2, liver functions were increased. UA  was positive, her culture was pending. Her leg was in her boot. Hospital day  #3, the patient still struggling with physical therapy, still has  significant redness about her foot. Fracture blisters are still intact. She  is afebrile. Ancef was switched to Zosyn due to the continued redness in her  foot. UTI showed gram negative rods. Hospital day #4, the patient still  continues to do well, still struggles with physical therapy due to her  weakness from MS. Her vital signs are stable, she is afebrile. Liver  functions are still increased. She is continued on Zosyn. Hospital day #5,  the patient had some soft stools. Stool cultures were ordered due to her  diarrhea and being on antibiotics. Stool cultures at this time have been  reincubated for better growth. Urine shows Klebsiella pneumoniae. ID was  consulted due to the continued redness in her foot. At that time, ID does  not feel that she has cellulitis; they feel this is just fracture erythema  to go along with her fracture blisters, but was placed on p.o. Ceftin for  her Klebsiella UTI. I&O catheterization did show UTI as clearing and her  foot is less red. Hepatitis A and hepatitis B studies were both negative.  Hospital day #7, the patient is  in stable condition, is being discharged to  [facility]. Limited weightbearing on her right lower extremity. Due to her  weakness from her MS, she is unable to hold her foot off the ground so she  needs to be limited to transfers to the bed and to the commode. She can also  be taught wheelchair transfers. Her weightbearing status will be limited for  6 more weeks as she is 2 weeks out from her fracture. She will need to  follow up in the office in 1 week with Dr. Thurston Collins. Please call [phone] to  make  that appointment. She is on a regular diet. We will progress her ambulation  as her strength improves and her fracture heals. Please call with  temperature greater than 101, increased redness in her foot, increased  swelling, or increased pain.      [name], P.A.      [name], M.D.  Electronically  Signed    KS/MEDQ  D:  11/05/2005  T:  11/05/2005  Job:  [id number]

## 2011-05-20 ENCOUNTER — Ambulatory Visit
Admission: RE | Admit: 2011-05-20 | Discharge: 2011-05-20 | Disposition: A | Discharge status: Home / Self Care | Payer: Medicare Other | Source: Ambulatory Visit | Attending: Family Medicine | Admitting: Family Medicine

## 2011-05-20 DIAGNOSIS — N644 Mastodynia: Secondary | ICD-10-CM

## 2011-09-25 LAB — BUN: BUN: 8

## 2011-09-25 LAB — CREATININE, SERUM
Creatinine, Ser: 0.69
GFR calc Af Amer: >60
GFR calc non Af Amer: >60

## 2011-10-02 LAB — URINALYSIS, ROUTINE W REFLEX MICROSCOPIC
Bilirubin Urine: NEGATIVE
Glucose, UA: NEGATIVE
Hgb urine dipstick: NEGATIVE
Ketones, ur: NEGATIVE
Nitrite: NEGATIVE
Protein, ur: NEGATIVE
Specific Gravity, Urine: 1.026
Urobilinogen, UA: 0.2
pH: 6

## 2012-06-26 ENCOUNTER — Encounter: Payer: Self-pay | Admitting: Family Medicine

## 2012-09-06 ENCOUNTER — Telehealth: Payer: Self-pay | Admitting: Family Medicine

## 2012-09-06 NOTE — Telephone Encounter (Signed)
See note

## 2012-09-06 NOTE — Telephone Encounter (Signed)
Okay to set up appt with me Best would be Medicare wellness visit to really go over everything

## 2012-09-06 NOTE — Telephone Encounter (Signed)
Pt called and says she's a former Dr. Hetty Ely pt who was est w/Dr. Para March, but would like to switch to Dr. Alphonsus Sias for her PCP.

## 2012-09-06 NOTE — Telephone Encounter (Signed)
Okay with me 

## 2012-09-06 NOTE — Telephone Encounter (Signed)
I spoke with patient and she scheduled appointment on 10/18/12.  Dr. Alphonsus Sias changed her appointment from [facility] to a regular visit.

## 2012-10-18 ENCOUNTER — Ambulatory Visit (INDEPENDENT_AMBULATORY_CARE_PROVIDER_SITE_OTHER): Payer: Medicare Other | Admitting: Internal Medicine

## 2012-10-18 ENCOUNTER — Encounter: Payer: Self-pay | Admitting: Internal Medicine

## 2012-10-18 VITALS — BP 106/60 | HR 56 | Temp 98.0°F

## 2012-10-18 DIAGNOSIS — G35 Multiple sclerosis: Secondary | ICD-10-CM

## 2012-10-18 DIAGNOSIS — N200 Calculus of kidney: Secondary | ICD-10-CM | POA: Insufficient documentation

## 2012-10-18 DIAGNOSIS — M810 Age-related osteoporosis without current pathological fracture: Secondary | ICD-10-CM | POA: Insufficient documentation

## 2012-10-18 DIAGNOSIS — Z1211 Encounter for screening for malignant neoplasm of colon: Secondary | ICD-10-CM

## 2012-10-18 DIAGNOSIS — S22009A Unspecified fracture of unspecified thoracic vertebra, initial encounter for closed fracture: Secondary | ICD-10-CM

## 2012-10-18 DIAGNOSIS — Z0000 Encounter for general adult medical examination without abnormal findings: Secondary | ICD-10-CM | POA: Insufficient documentation

## 2012-10-18 DIAGNOSIS — M6250 Muscle wasting and atrophy, not elsewhere classified, unspecified site: Secondary | ICD-10-CM

## 2012-10-18 NOTE — Progress Notes (Signed)
Subjective:    Patient ID: [name], female    DOB: [birth year], 66 y.o.   MRN: [id number]  HPI Here to establish Had been seeing Dr Renae Fickle is here  Has been wheelchair bound since 2011 Transfers on her own Bed bath independently cammode linked to bed Son gets her groceries. She does other instrumental ADLs  History MS No treatment---she is not interested Not active at this point  Compression fracture in past Stopped vitamin D and calcium due to kidney stones  No pain issues from past compression fractures  No current outpatient prescriptions on file prior to visit.    Allergies  Allergen Reactions  . Procaine Hcl     REACTION: UNSPECIFIED    Past Medical History  Diagnosis Date  . Compression fracture 2011    Dr. Dion Saucier   . MS (multiple sclerosis)     evaluated by Dr. Sandria Manly  . Toe fracture 1998    5  . Rib fractures 1998, [id number]    2  . Ankle fracture, right 10/30/06    [facility]  . Ankle fracture, left 07/2006  . Disc herniation     L5, S1  . MVP (mitral valve prolapse) 1979    ECHO. Trace MR, trace TR, trace PR 07/17/03  . Osteoporosis     Past Surgical History  Procedure Date  . Total abdominal hysterectomy w/ bilateral salpingoophorectomy 1982    for endometriosis  . Septoplasty 1983    deviated septum  . Lumbar disc surgery 04/2007    L5, S1- [facility]  . Tonsillectomy and adenoidectomy 1960    Family History  Problem Relation Age of Onset  . Stroke Mother   . Neuropathy Brother   . Hypertension Neg Hx   . Diabetes Neg Hx   . Depression Neg Hx   . Alcohol abuse Neg Hx   . Drug abuse Neg Hx   . Stroke Father     History   Social History  . Marital Status: Widowed    Spouse Name: N/A    Number of Children: 2  . Years of Education: N/A   Occupational History  . Secretarial work---disabled    Social History Main Topics  . Smoking status: Never Smoker   . Smokeless tobacco: Never Used  . Alcohol Use: No  . Drug Use: No  .  Sexually Active: Not on file   Other Topics Concern  . Not on file   Social History Narrative   Widowed 1991.  Lives alone. Played piano. Non ambulatory as of 2011 due to BLE weakness-from injury after back surgery   Review of Systems Sleeps okay most of the time Appetite is good Weight is fairly stable--though she doesn't weigh herself     Objective:   Physical Exam  Constitutional: She appears well-developed and well-nourished. No distress.       In wheelchair Has motorized chair for at home  Neck: Normal range of motion. Neck supple. No thyromegaly present.  Cardiovascular: Normal rate, regular rhythm and normal heart sounds.  Exam reveals no gallop.   No murmur heard. Pulmonary/Chest: Effort normal and breath sounds normal. No respiratory distress. She has no wheezes. She has no rales.  Abdominal: Soft. There is no tenderness.  Musculoskeletal: She exhibits no edema.  Lymphadenopathy:    She has no cervical adenopathy.  Neurological:       3/5 strength in LE Normal in UE  Skin:       Several small seb keratoses  Psychiatric: She has a normal mood and affect. Her behavior is normal.          Assessment & Plan:

## 2012-10-18 NOTE — Assessment & Plan Note (Signed)
Doesn't want any vaccines Mammogram every other year No Paps Will do stool immunoassay

## 2012-10-18 NOTE — Assessment & Plan Note (Signed)
No Rx due to ongoing kidney stones

## 2012-10-18 NOTE — Assessment & Plan Note (Signed)
Doesn't appear to be active No Rx  Sees Dr Sandria Manly every few years

## 2012-10-18 NOTE — Assessment & Plan Note (Signed)
No major pain issues Has been disabled and wheelchair bound since after 2011 surgery

## 2012-10-19 ENCOUNTER — Encounter: Payer: Self-pay | Admitting: *Deleted

## 2012-10-19 LAB — RENAL FUNCTION PANEL
Albumin: 3.8 g/dL (ref 3.5–5.2)
BUN: 13 mg/dL (ref 6–23)
Calcium: 9.4 mg/dL (ref 8.4–10.5)
Creatinine, Ser: 0.7 mg/dL (ref 0.4–1.2)
Glucose, Bld: 97 mg/dL (ref 70–99)

## 2012-10-19 LAB — CBC WITH DIFFERENTIAL/PLATELET
Basophils Relative: 0.3 % (ref 0.0–3.0)
Eosinophils Absolute: 0.1 10*3/uL (ref 0.0–0.7)
Hemoglobin: 13.2 g/dL (ref 12.0–15.0)
MCHC: 32.4 g/dL (ref 30.0–36.0)
MCV: 95.5 fl (ref 78.0–100.0)
Monocytes Absolute: 0.5 10*3/uL (ref 0.1–1.0)
Neutro Abs: 3.8 10*3/uL (ref 1.4–7.7)
RBC: 4.26 Mil/uL (ref 3.87–5.11)
RDW: 12.4 % (ref 11.5–14.6)

## 2012-10-28 ENCOUNTER — Other Ambulatory Visit (INDEPENDENT_AMBULATORY_CARE_PROVIDER_SITE_OTHER): Payer: Medicare Other

## 2012-10-28 DIAGNOSIS — Z1211 Encounter for screening for malignant neoplasm of colon: Secondary | ICD-10-CM

## 2012-11-01 ENCOUNTER — Encounter: Payer: Self-pay | Admitting: *Deleted

## 2013-05-06 ENCOUNTER — Other Ambulatory Visit: Payer: Self-pay | Admitting: Neurosurgery

## 2013-05-06 DIAGNOSIS — M549 Dorsalgia, unspecified: Secondary | ICD-10-CM

## 2013-05-11 ENCOUNTER — Other Ambulatory Visit: Payer: Self-pay | Admitting: Neurosurgery

## 2013-05-11 DIAGNOSIS — M25552 Pain in left hip: Secondary | ICD-10-CM

## 2013-05-11 DIAGNOSIS — M549 Dorsalgia, unspecified: Secondary | ICD-10-CM

## 2013-05-16 ENCOUNTER — Other Ambulatory Visit: Payer: Medicare Other

## 2013-05-25 ENCOUNTER — Ambulatory Visit
Admission: RE | Admit: 2013-05-25 | Discharge: 2013-05-25 | Disposition: A | Discharge status: Home / Self Care | Payer: Medicare Other | Source: Ambulatory Visit | Attending: Neurosurgery | Admitting: Neurosurgery

## 2013-05-25 DIAGNOSIS — M549 Dorsalgia, unspecified: Secondary | ICD-10-CM

## 2013-05-25 DIAGNOSIS — M25552 Pain in left hip: Secondary | ICD-10-CM

## 2013-05-25 MED ORDER — GADOBENATE DIMEGLUMINE 529 MG/ML IV SOLN: 15.0000 mL | Freq: Once | INTRAVENOUS | Status: AC | PRN

## 2013-08-10 ENCOUNTER — Ambulatory Visit (INDEPENDENT_AMBULATORY_CARE_PROVIDER_SITE_OTHER): Payer: Medicare Other | Admitting: Diagnostic Neuroimaging

## 2013-08-10 ENCOUNTER — Encounter: Payer: Self-pay | Admitting: Diagnostic Neuroimaging

## 2013-08-10 VITALS — BP 112/64 | HR 61 | Temp 97.2°F

## 2013-08-10 DIAGNOSIS — G35 Multiple sclerosis: Secondary | ICD-10-CM

## 2013-08-10 NOTE — Progress Notes (Signed)
[facility]  PATIENT: [name] DOB: 1946/12/15  REFERRING CLINICIAN:  HISTORY FROM: patient and neighbor REASON FOR VISIT: follow up (Dr. Sandria Manly transfer)   HISTORICAL  CHIEF COMPLAINT:  Chief Complaint  Patient presents with  . Follow-up    HISTORY OF PRESENT ILLNESS:   UPDATE 08/10/13: Since last visit, no new issues. I reviewed patient's original diagnosis and disposition to defer immunomodulatory treatment. Apparently in 1982 patient developed gait and balance difficulty. She has never had MRI scan of the brain. Apparently at that time she had a lumbar puncture which showed oligoclonal bands. She had some other friends who had also been diagnosed with multiple sclerosis and treated with some medications at the time who had bad side effects (apparently one friend died and another friend had significant worsening of symptoms). Ever since that time she's been extremely reluctant to try MS medications.  Over the years patient has progressively deteriorated. Around 2005 and 2006 patient was using a walker. She then had multiple falls and fractures. By 2007 she was using a wheelchair according to Dr. Imagene Gurney notes. Patient refutes this observation and thinks that she's been using wheelchair only for last couple of years.   PRIOR HPI (06/24/12, Dr. Sandria Manly):  67 year old right-handed white widowed female with multiple sclerosis first seen in approximately 1982 for gait disorder who has never been on immunomodulation therapy and only rarely sees physicians. She been followed by Dr. Salvatore Marvel because of fractures with falls. She broke her right ankle in 2006 and did not walk from November 2006 to May 2007. In May 2007 she fell fracturing her left ankle. She has been using a wheelchair since that time. At one point she told me that she was being lifted by her son 07/24/2006 and developed an acute onset of pain in her left hip region radiating to the back of her left leg.  She also indicates that her pain occurred  when she was being treated with physical therapy and believes that one of the therapist may have injured her.She did not fall but had severe pain 6/28/2011and developed  a new fracture in the thoracic region.She was seen by Dr. Dion Saucier. She was not treated with a brace or corset. She did not have a surgical procedure, kyphoplasty, or vertebroplasty. Her pain improved. She has bladder incontinence but good bowel control .She can dress, feed,  bath herself, and take care of her toilet needs. She lives alone, drives a car, and  cooks except using the oven,.She denies visual loss,  double vision or swallowing problems, Lhermittes sign ,memory problems,  numbness, or tingling. She does have back pain. She denies depression. 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 Jeral Fruit. Last CT scan of the abdomen and  pelvis without contrast 07/24/2009  showed no acute abnormalities and a nonobstructing right renal calculus.She had advanced L5-S1 DJD and previous hysterectomy. She had a chronic right distal ureteral calculus. She uses an Mining engineer wheelchair at home and used to do exercises in bed for physical therapy.  REVIEW OF SYSTEMS: Full 14 system review of systems performed and notable only for gait and balance difficulty, lower extremity weakness, urinary tract infections.  ALLERGIES: Allergies  Allergen Reactions  . Procaine Hcl     REACTION: UNSPECIFIED    HOME MEDICATIONS: Prior to Admission medications   Medication Sig Start Date End Date Taking? Authorizing Provider  triazolam (HALCION) 0.25 MG tablet Take 0.25  mg by mouth at bedtime as needed.   Yes Historical Provider, MD  trimethoprim (TRIMPEX) 100 MG tablet Take 100 mg by mouth daily.   Yes Historical Provider, MD  amoxicillin (AMOXIL) 500 MG capsule Take 1 capsule by mouth as needed. 07/20/13   Historical Provider, MD  ciprofloxacin (CIPRO) 250  MG tablet Take 1 tablet by mouth as needed. 06/29/13   Historical Provider, MD   Outpatient Prescriptions Prior to Visit  Medication Sig Dispense Refill  . triazolam (HALCION) 0.25 MG tablet Take 0.25 mg by mouth at bedtime as needed.      . trimethoprim (TRIMPEX) 100 MG tablet Take 100 mg by mouth daily.      . Cholecalciferol (VITAMIN D) 2000 UNITS CAPS Take 1 capsule by mouth daily.       No facility-administered medications prior to visit.    PAST MEDICAL HISTORY: Past Medical History  Diagnosis Date  . Compression fracture 2011    Dr. Dion Saucier   . MS (multiple sclerosis)     evaluated by Dr. Sandria Manly  . Toe fracture 1998    5  . Rib fractures 1998, [id number]    2  . Ankle fracture, right 10/30/06    [facility]  . Ankle fracture, left 07/2006  . Disc herniation     L5, S1  . MVP (mitral valve prolapse) 1979    ECHO. Trace MR, trace TR, trace PR 07/17/03  . Osteoporosis   . Nephrolithiasis     PAST SURGICAL HISTORY: Past Surgical History  Procedure Laterality Date  . Total abdominal hysterectomy w/ bilateral salpingoophorectomy  1982    for endometriosis  . Septoplasty  1983    deviated septum  . Lumbar disc surgery  04/2007    L5, S1- [facility]  . Tonsillectomy and adenoidectomy  1960    FAMILY HISTORY: Family History  Problem Relation Age of Onset  . Stroke Mother   . Neuropathy Brother   . Hypertension Neg Hx   . Diabetes Neg Hx   . Depression Neg Hx   . Alcohol abuse Neg Hx   . Drug abuse Neg Hx   . Stroke Father     SOCIAL HISTORY:  History   Social History  . Marital Status: Widowed    Spouse Name: N/A    Number of Children: 2  . Years of Education: HS   Occupational History  . Secretarial work---disabled    Social History Main Topics  . Smoking status: Never Smoker   . Smokeless tobacco: Never Used  . Alcohol Use: No  . Drug Use: No  . Sexual Activity: Not on file   Other Topics Concern  . Not on file   Social History Narrative   Widowed 1991.   Lives alone.    Played piano.    Non ambulatory as of 2011 due to BLE weakness-from injury after back surgery   Caffeine Use: rarely     PHYSICAL EXAM  Filed Vitals:   08/10/13 1441  BP: 112/64  Pulse: 61  Temp: 97.2 F (36.2 C)  TempSrc: Oral    Not recorded    Cannot calculate BMI with a height equal to zero.  GENERAL EXAM: Patient is in no distress  CARDIOVASCULAR: Regular rate and rhythm, no murmurs, no carotid bruits  NEUROLOGIC: MENTAL STATUS: awake, alert, language fluent, comprehension intact, naming intact; EUPHORIC. CRANIAL NERVE: no papilledema on fundoscopic exam, pupils equal and reactive to light, visual fields full to confrontation, extraocular muscles intact, no nystagmus, facial  sensation and strength symmetric, uvula midline, shoulder shrug symmetric, tongue midline. MOTOR: BUE 4. RLE INCR TONE, LLE DECR TONE. BLE (R HF 3, L HF 2-3, KNEE EXT 4, KNEE FLEX 3, DF 2).  SENSORY: ABSENT VIB AT TOES.  COORDINATION: BUE DYSMETRIA. REFLEXES: BUE 3, KNEES 2, ANKLES 2 GAIT/STATION: IN WHEELCHAIR, CANNOT STAND UNASSISTED   DIAGNOSTIC DATA (LABS, IMAGING, TESTING) - I reviewed patient records, labs, notes, testing and imaging myself where available.  Lab Results  Component Value Date   WBC 6.6 10/18/2012   HGB 13.2 10/18/2012   HCT 40.7 10/18/2012   MCV 95.5 10/18/2012   PLT 354.0 10/18/2012      Component Value Date/Time   NA 141 10/18/2012 1731   K 4.6 10/18/2012 1731   CL 104 10/18/2012 1731   CO2 29 10/18/2012 1731   GLUCOSE 97 10/18/2012 1731   BUN 13 10/18/2012 1731   CREATININE 0.7 10/18/2012 1731   CALCIUM 9.4 10/18/2012 1731   ALBUMIN 3.8 10/18/2012 1731   GFRNONAA >60 09/01/2007 1050   GFRAA  Value: >60        The eGFR has been calculated using the MDRD equation. This calculation has not been validated in all clinical 09/01/2007 1050   No results found for this basename: CHOL, HDL, LDLCALC, LDLDIRECT, TRIG, CHOLHDL   No results found for this basename:  HGBA1C   No results found for this basename: VITAMINB12   Lab Results  Component Value Date   TSH 1.83 10/18/2012     ASSESSMENT AND PLAN  67 y.o. year old female here with multiple sclerosis diagnosis since 1982, never on immunomodulatory medications. Patient's diagnosis occurred before widespread availability of MRI. Clinically her symptoms do fit with a central nervous system pathology. I discussed options for confirmatory diagnosis and testing, updated her on MS medications, and reviewed contemporary multiple sclerosis management. She kindly declined any multiple sclerosis medications.   PLAN: Patient feels stable at this time. I suggested she followup with me as needed if she has new neurologic symptoms. Also if she decides she would like to pursue disease modifying therapy for multiple sclerosis, I would be happy to see her sooner and review options for diagnosis and treatment.  Return if symptoms worsen or fail to improve, for return to PCP.   Suanne Marker, MD 08/10/2013, 4:25 PM Certified in Neurology, Neurophysiology and Neuroimaging  [facility] [address] [place], [place] [address] [phone]

## 2013-09-28 ENCOUNTER — Telehealth: Payer: Self-pay | Admitting: Internal Medicine

## 2013-09-28 NOTE — Telephone Encounter (Signed)
Patient Information:  Caller Name: Ajah  Phone: [phone]  Patient: [name], [name]  Gender: Female  DOB: 1946/09/14  Age: 67 Years  PCP: Tillman Abide [facility])  Office Follow Up:  Does the office need to follow up with this patient?: No  Instructions For The Office: N/A  RN Note:  Pain below Left Armpit at Breast level.  Pt had hard time taking deep breath over weekend, breathing has improved since onset.  Pt thinks she may have pulled a muscle. Painful with pressure. Pt has appt at 715 on 10-17.  Appt offered on 10-16, Pt will keep 10-17 appt and call back if sxs worsen.  Symptoms  Reason For Call & Symptoms: Pain below Left Armpit at Breast level  Reviewed Health History In EMR: Yes  Reviewed Medications In EMR: Yes  Reviewed Allergies In EMR: Yes  Reviewed Surgeries / Procedures: Yes  Date of Onset of Symptoms: 09/23/2013  Guideline(s) Used:  Chest Injury  Disposition Per Guideline:   See Today or Tomorrow in Office  Reason For Disposition Reached:   High-risk adult (e.g., age > 60, osteoporosis, chronic steroid use)  Advice Given:  N/A  RN Overrode Recommendation:  Make Appointment  PT has appt prior to traige.

## 2013-09-29 NOTE — Telephone Encounter (Signed)
Will evaluate at the appt tomorrow

## 2013-09-30 ENCOUNTER — Encounter: Payer: Self-pay | Admitting: Internal Medicine

## 2013-09-30 ENCOUNTER — Ambulatory Visit (INDEPENDENT_AMBULATORY_CARE_PROVIDER_SITE_OTHER): Payer: Medicare Other | Admitting: Internal Medicine

## 2013-09-30 VITALS — BP 122/68 | HR 60 | Temp 98.2°F

## 2013-09-30 DIAGNOSIS — R071 Chest pain on breathing: Secondary | ICD-10-CM

## 2013-09-30 DIAGNOSIS — R0781 Pleurodynia: Secondary | ICD-10-CM

## 2013-09-30 MED ORDER — HYDROCODONE-ACETAMINOPHEN 5-325 MG PO TABS: 1.0000 | ORAL_TABLET | Freq: Four times a day (QID) | ORAL | Status: DC | PRN

## 2013-09-30 NOTE — Assessment & Plan Note (Signed)
Some anterior rib tenderness but I don't think she has fracture Probably costochondritis and other soft tissue injury from the cleaning No evidence of infection or pericarditis  Will give Rx for hydrocodone Can try heat or ice

## 2013-09-30 NOTE — Progress Notes (Signed)
Subjective:    Patient ID: [name], female    DOB: 12/13/1946, 67 y.o.   MRN: [id number]  HPI Here with son  Started 6-7 days ago Trouble taking a deep breath and didn't feel right Pain was bad in left axilla and into back Some pain/soreness under left shoulder and occasionally into breast  No fever No cough Some breathing problems---just inability to take deep breath Pain also when she moved her left arm across her body  Did spend a long time cleaning house for guests just before this started (vacuuming, bending over, etc)  Current Outpatient Prescriptions on File Prior to Visit  Medication Sig Dispense Refill  . amoxicillin (AMOXIL) 500 MG capsule Take 4 capsules by mouth as needed. 1 hour before dentist appt      . ciprofloxacin (CIPRO) 250 MG tablet Take 1 tablet by mouth 2 (two) times daily. For UTI      . trimethoprim (TRIMPEX) 100 MG tablet Take 100 mg by mouth daily. UTI preventative       No current facility-administered medications on file prior to visit.    Allergies  Allergen Reactions  . Procaine Hcl     REACTION: UNSPECIFIED    Past Medical History  Diagnosis Date  . Compression fracture 2011    Dr. Dion Saucier   . MS (multiple sclerosis)     evaluated by Dr. Sandria Manly  . Toe fracture 1998    5  . Rib fractures 1998, [id number]    2  . Ankle fracture, right 10/30/06    [facility]  . Ankle fracture, left 07/2006  . Disc herniation     L5, S1  . MVP (mitral valve prolapse) 1979    ECHO. Trace MR, trace TR, trace PR 07/17/03  . Osteoporosis   . Nephrolithiasis     Past Surgical History  Procedure Laterality Date  . Total abdominal hysterectomy w/ bilateral salpingoophorectomy  1982    for endometriosis  . Septoplasty  1983    deviated septum  . Lumbar disc surgery  04/2007    L5, S1- [facility]  . Tonsillectomy and adenoidectomy  1960    Family History  Problem Relation Age of Onset  . Stroke Mother   . Neuropathy Brother   . Hypertension Neg Hx   .  Diabetes Neg Hx   . Depression Neg Hx   . Alcohol abuse Neg Hx   . Drug abuse Neg Hx   . Stroke Father     History   Social History  . Marital Status: Widowed    Spouse Name: N/A    Number of Children: 2  . Years of Education: HS   Occupational History  . Secretarial work---disabled    Social History Main Topics  . Smoking status: Never Smoker   . Smokeless tobacco: Never Used  . Alcohol Use: No  . Drug Use: No  . Sexual Activity: Not on file   Other Topics Concern  . Not on file   Social History Narrative   Widowed 1991.  Lives alone.    Played piano.    Non ambulatory as of 2011 due to BLE weakness-from injury after back surgery   Caffeine Use: rarely   Review of Systems Some sleep problems---couldn't lie down without more pain Did have brief rhinorrhea and sneezing last week    Objective:   Physical Exam  Constitutional: She appears well-developed and well-nourished. No distress.  Neck: Normal range of motion. Neck supple. No thyromegaly present.  Cardiovascular:  Normal rate, regular rhythm and normal heart sounds.  Exam reveals no gallop and no friction rub.   No murmur heard. Pulmonary/Chest: Effort normal and breath sounds normal. No respiratory distress. She has no wheezes. She has no rales.  Some tenderness in left axilla and ~3rd costochondral junction  Musculoskeletal:  Left scoliosis No spine tenderness  Lymphadenopathy:    She has no cervical adenopathy.  Psychiatric: She has a normal mood and affect. Her behavior is normal.          Assessment & Plan:

## 2013-10-03 ENCOUNTER — Telehealth: Payer: Self-pay | Admitting: Internal Medicine

## 2013-10-03 NOTE — Telephone Encounter (Signed)
Pt called and is having some issues when she eats, she is "belching up sour taste at times".  She wonders if this is "going along" with the diagnosis she had on Friday.  Please give her a call, best number [phone].

## 2013-10-03 NOTE — Telephone Encounter (Signed)
If she is having heartburn, it could cause some of the pain she has been having  I would recommend she try omeprazole 20mg  (OTC) daily on an empty stomach for 2 weeks. If she injured her food pipe with acid while doing all that cleaning, that should be enough to heal it up.

## 2013-10-04 NOTE — Telephone Encounter (Signed)
Spoke with patient and advised results   

## 2013-10-27 ENCOUNTER — Other Ambulatory Visit: Payer: Self-pay

## 2013-10-27 DIAGNOSIS — Z1231 Encounter for screening mammogram for malignant neoplasm of breast: Secondary | ICD-10-CM

## 2013-11-25 ENCOUNTER — Encounter: Payer: Self-pay | Admitting: *Deleted

## 2013-11-25 ENCOUNTER — Ambulatory Visit
Admission: RE | Admit: 2013-11-25 | Discharge: 2013-11-25 | Disposition: A | Discharge status: Home / Self Care | Payer: Medicare Other | Source: Ambulatory Visit

## 2013-11-25 DIAGNOSIS — Z1231 Encounter for screening mammogram for malignant neoplasm of breast: Secondary | ICD-10-CM

## 2014-04-10 ENCOUNTER — Telehealth: Payer: Self-pay | Admitting: Internal Medicine

## 2014-04-10 NOTE — Telephone Encounter (Signed)
rx faxed and scanned 

## 2014-04-10 NOTE — Telephone Encounter (Signed)
I wrote the prescription but I suspect she will need a face to face visit

## 2014-04-10 NOTE — Telephone Encounter (Signed)
Pt's power wheelchair is broken and her insurance says if we write her an rx she could get a new one. Please advise.

## 2014-04-10 NOTE — Telephone Encounter (Signed)
Pt needs an appt right?

## 2014-04-10 NOTE — Telephone Encounter (Signed)
Pt left v/m; pt has spoken to medicare and pt said on rx for power chair; needs to include pt has need for power chair and state pt condition and why pt needs chair.

## 2014-04-11 NOTE — Telephone Encounter (Signed)
Form signed Have her schedule face to face after the PT eval

## 2014-04-11 NOTE — Telephone Encounter (Signed)
Tonya with [facility] left v/m; Tonya faxed a form to Dr Alphonsus Sias  That pt needs PT evaluation before pt can get w/c due to medicare guidelines; after PT evaluation pt will need to schedule face to face appt with Dr Alphonsus Sias. Faxed form is in Dr Karle Starch in box.

## 2014-05-01 ENCOUNTER — Ambulatory Visit: Payer: Medicare Other | Attending: Internal Medicine | Admitting: Rehabilitative and Restorative Service Providers"

## 2014-05-01 DIAGNOSIS — G35 Multiple sclerosis: Secondary | ICD-10-CM | POA: Insufficient documentation

## 2014-05-01 DIAGNOSIS — M6281 Muscle weakness (generalized): Secondary | ICD-10-CM | POA: Diagnosis not present

## 2014-05-01 DIAGNOSIS — IMO0001 Reserved for inherently not codable concepts without codable children: Secondary | ICD-10-CM | POA: Diagnosis not present

## 2014-06-12 ENCOUNTER — Ambulatory Visit: Payer: Medicare Other | Admitting: Internal Medicine

## 2014-06-19 ENCOUNTER — Ambulatory Visit (INDEPENDENT_AMBULATORY_CARE_PROVIDER_SITE_OTHER): Payer: Medicare Other | Admitting: Internal Medicine

## 2014-06-19 ENCOUNTER — Encounter: Payer: Self-pay | Admitting: Internal Medicine

## 2014-06-19 VITALS — BP 118/60 | HR 63 | Temp 98.6°F

## 2014-06-19 DIAGNOSIS — G35 Multiple sclerosis: Secondary | ICD-10-CM

## 2014-06-19 NOTE — Progress Notes (Signed)
Pre visit review using our clinic review tool, if applicable. No additional management support is needed unless otherwise documented below in the visit note. 

## 2014-06-19 NOTE — Progress Notes (Signed)
   Subjective:    Patient ID: [name], female    DOB: 29-Jul-1946, 68 y.o.   MRN: [id number]  HPI Here with son   She has been wheelchair bound since 2008 due to her multiple sclerosis Reviewed neurology notes Has had power chair since that time Still lives alone  I have reviewed the PT evaluation for the wheelchair and concur with their results She needs to tilt and recline feature to relieve some of the pressure on her buttocks and sacral area while in the chair.  Current Outpatient Prescriptions on File Prior to Visit  Medication Sig Dispense Refill  . trimethoprim (TRIMPEX) 100 MG tablet Take 100 mg by mouth daily. UTI preventative       No current facility-administered medications on file prior to visit.    Allergies  Allergen Reactions  . Procaine Hcl     REACTION: UNSPECIFIED    Past Medical History  Diagnosis Date  . Compression fracture 2011    Dr. Dion Saucier   . MS (multiple sclerosis)     evaluated by Dr. Sandria Manly  . Toe fracture 1998    5  . Rib fractures 1998, [id number]    2  . Ankle fracture, right 10/30/06    [facility]  . Ankle fracture, left 07/2006  . Disc herniation     L5, S1  . MVP (mitral valve prolapse) 1979    ECHO. Trace MR, trace TR, trace PR 07/17/03  . Osteoporosis   . Nephrolithiasis     Past Surgical History  Procedure Laterality Date  . Total abdominal hysterectomy w/ bilateral salpingoophorectomy  1982    for endometriosis  . Septoplasty  1983    deviated septum  . Lumbar disc surgery  04/2007    L5, S1- [facility]  . Tonsillectomy and adenoidectomy  1960    Family History  Problem Relation Age of Onset  . Stroke Mother   . Neuropathy Brother   . Hypertension Neg Hx   . Diabetes Neg Hx   . Depression Neg Hx   . Alcohol abuse Neg Hx   . Drug abuse Neg Hx   . Stroke Father     History   Social History  . Marital Status: Widowed    Spouse Name: N/A    Number of Children: 2  . Years of Education: HS   Occupational History  .  Secretarial work---disabled    Social History Main Topics  . Smoking status: Never Smoker   . Smokeless tobacco: Never Used  . Alcohol Use: No  . Drug Use: No  . Sexual Activity: Not on file   Other Topics Concern  . Not on file   Social History Narrative   Widowed 1991.  Lives alone.    Played piano.    Non ambulatory as of 2011 due to BLE weakness-from injury after back surgery   Caffeine Use: rarely   Review of Systems     Objective:   Physical Exam        Assessment & Plan:

## 2014-06-19 NOTE — Assessment & Plan Note (Signed)
Power wheelchair bound  Her previous one doesn't work anymore Reviewed PT eval which I agree with  Rx done Entire ~15 minute visit in care planning for this

## 2015-02-14 ENCOUNTER — Telehealth: Payer: Self-pay | Admitting: Family Medicine

## 2015-02-14 NOTE — Telephone Encounter (Signed)
Patient Name: [name]  DOB: 10-18-1946    Initial Comment Caller states she is having pain in her breast and under her arm and into her back and her collarbone area and heartburn.    Nurse Assessment  Nurse: Shelva Majestic, RN, Marchelle Folks Date/Time Lamount Cohen Time): 02/14/2015 3:26:48 PM  Confirm and document reason for call. If symptomatic, describe symptoms. ---Caller states she is having pain in her breast and under her arm and into her back and her collarbone area and heartburn. sx are constant and have been present for weeks; pt states she wants an ultrasound of the breast  Has the patient traveled out of the country within the last 30 days? ---Not Applicable  Does the patient require triage? ---Yes  Related visit to physician within the last 2 weeks? ---No  Does the PT have any chronic conditions? (i.e. diabetes, asthma, etc.) ---Yes  List chronic conditions. ---MS     Guidelines    Guideline Title Affirmed Question Affirmed Notes  Breast Symptoms Change in shape or appearance of breast    Final Disposition User   See PCP When Office is Open (within 3 days) Chad, Charity fundraiser, Marchelle Folks    Comments  Pt stated she does not want to see PCP within 3 days because she is afraid another pt will have the flu and spread it to her since she does not get the flu vaccine. Pt stated she has an appt scheduled for 4/28, but would like to schedule an appt sooner if available; Scheduled pt for an appt on 4/14 at 5pm with Dr. Dallas Schimke

## 2015-02-14 NOTE — Telephone Encounter (Signed)
Spoke with Nicki Reaper NP and pt should not wait until 03/2015 for appt. Nicki Reaper np advised needs to be seen 02/15/15; pt said she cannot come before 4:30 pm due to her son bringing her and pt does not want to sit in waiting rm until can be taken to exam room due to fear of contacting the flu. Pt scheduled appt with Dr Alphonsus Sias on 02/15/15 at 4:30 and pts son will stay in waiting room while pt sits in car until can be taken to exam room. If condition changes or worsens prior to appt pt can call [facility] or if at night go to [facility] for eval. Pt voiced understanding.

## 2015-02-15 ENCOUNTER — Encounter: Payer: Self-pay | Admitting: Internal Medicine

## 2015-02-15 ENCOUNTER — Ambulatory Visit (INDEPENDENT_AMBULATORY_CARE_PROVIDER_SITE_OTHER): Payer: Medicare Other | Admitting: Internal Medicine

## 2015-02-15 VITALS — BP 130/72 | HR 61 | Temp 98.1°F

## 2015-02-15 DIAGNOSIS — R079 Chest pain, unspecified: Secondary | ICD-10-CM | POA: Insufficient documentation

## 2015-02-15 DIAGNOSIS — R0789 Other chest pain: Secondary | ICD-10-CM

## 2015-02-15 NOTE — Telephone Encounter (Signed)
Will eval at [facility]

## 2015-02-15 NOTE — Progress Notes (Signed)
Subjective:    Patient ID: [name], female    DOB: 02-12-1946, 69 y.o.   MRN: [id number]  HPI Ongoing problems with left breast pain Goes back before 11/14 Intermittent but can feel it all the time Pain can be bad if she hits it--but sometimes without any provocation Pain into back as well and axilla Also anterior chest  Achy pain No burning or stabbing Not pleuritic now  Hasn't taken anything for this  MS hasn't changed  Current Outpatient Prescriptions on File Prior to Visit  Medication Sig Dispense Refill  . trimethoprim (TRIMPEX) 100 MG tablet Take 100 mg by mouth daily. UTI preventative     No current facility-administered medications on file prior to visit.    Allergies  Allergen Reactions  . Procaine Hcl     REACTION: UNSPECIFIED    Past Medical History  Diagnosis Date  . Compression fracture 2011    Dr. Dion Saucier   . MS (multiple sclerosis)     evaluated by Dr. Sandria Manly  . Toe fracture 1998    5  . Rib fractures 1998, [id number]    2  . Ankle fracture, right 10/30/06    [facility]  . Ankle fracture, left 07/2006  . Disc herniation     L5, S1  . MVP (mitral valve prolapse) 1979    ECHO. Trace MR, trace TR, trace PR 07/17/03  . Osteoporosis   . Nephrolithiasis     Past Surgical History  Procedure Laterality Date  . Total abdominal hysterectomy w/ bilateral salpingoophorectomy  1982    for endometriosis  . Septoplasty  1983    deviated septum  . Lumbar disc surgery  04/2007    L5, S1- [facility]  . Tonsillectomy and adenoidectomy  1960    Family History  Problem Relation Age of Onset  . Stroke Mother   . Neuropathy Brother   . Hypertension Neg Hx   . Diabetes Neg Hx   . Depression Neg Hx   . Alcohol abuse Neg Hx   . Drug abuse Neg Hx   . Stroke Father     History   Social History  . Marital Status: Widowed    Spouse Name: N/A  . Number of Children: 2  . Years of Education: HS   Occupational History  . Secretarial work---disabled    Social  History Main Topics  . Smoking status: Never Smoker   . Smokeless tobacco: Never Used  . Alcohol Use: No  . Drug Use: No  . Sexual Activity: Not on file   Other Topics Concern  . Not on file   Social History Narrative   Widowed 1991.  Lives alone.    Played piano.    Non ambulatory as of 2011 due to BLE weakness-from injury after back surgery   Caffeine Use: rarely   Review of Systems Heartburn and burping in past 2 months---this is new No cough, SOB or fever    Objective:   Physical Exam  Constitutional: She appears well-nourished. No distress.  Cardiovascular: Normal rate, regular rhythm and normal heart sounds.  Exam reveals no gallop.   No murmur heard. Pulmonary/Chest: Effort normal and breath sounds normal. No respiratory distress. She has no wheezes. She has no rales.  Genitourinary:  Mild cystic changes in both breasts Slight tenderness (vague) on left  Musculoskeletal:  No rib pain  Lymphadenopathy:    She has no axillary adenopathy.       Right: No supraclavicular adenopathy present.  Left: No supraclavicular adenopathy present.  Neurological:  Altered sensation over tender area posterior left shoulder ?on chest also Not really in axilla  Psychiatric: She has a normal mood and affect. Her behavior is normal.          Assessment & Plan:

## 2015-02-15 NOTE — Progress Notes (Signed)
Pre visit review using our clinic review tool, if applicable. No additional management support is needed unless otherwise documented below in the visit note. 

## 2015-02-15 NOTE — Assessment & Plan Note (Signed)
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

## 2015-03-29 ENCOUNTER — Ambulatory Visit: Payer: Self-pay | Admitting: Family Medicine

## 2015-04-12 ENCOUNTER — Ambulatory Visit: Payer: Medicare Other | Admitting: Family Medicine

## 2015-07-24 ENCOUNTER — Telehealth: Payer: Self-pay | Admitting: Internal Medicine

## 2015-07-24 NOTE — Telephone Encounter (Signed)
Pt has appt for f/u UTI on 07/25/15 with Nicki Reaper NP.

## 2015-07-24 NOTE — Telephone Encounter (Signed)
Patient Name: [name] DOB: 1946-07-28 Initial Comment Caller states c/o swollen from breast down to navel area, headaches, blurry vision, decreased urination Nurse Assessment Nurse: Elijah Birk, RN, Stark Bray Date/Time (Eastern Time): 07/24/2015 8:42:24 AM Confirm and document reason for call. If symptomatic, describe symptoms. ---Caller states she is swollen from breast down to navel area, headaches, blurry vision, decreased urination. Is on rx for a bladder infection, an antibiotic for 10 days. Symptoms have been going on for a couple weeks. Can hear her heartbeat in her ears, hard to sleep. Not sure if fever. Has the patient traveled out of the country within the last 30 days? ---No Does the patient require triage? ---Yes Related visit to physician within the last 2 weeks? ---Yes Does the PT have any chronic conditions? (i.e. diabetes, asthma, etc.) ---Yes List chronic conditions. ---?MS Guidelines Guideline Title Affirmed Question Affirmed Notes Urinary Tract Infection on Antibiotic Follow-up Call - Female [1] Taking antibiotic > 72 hours (3 days) for UTI AND [2] flank or lower back pain not improved Final Disposition User See Physician within 24 Hours [place], RN, Stark Bray Comments Caller denies headache, vision issues this am. States she has some swelling/distention in area below breast towards navel, but not specific on that symptom. Triaged for follow-up of UTI. Caller feels she may be dehydrated. Wondering how long it would take for them to know her K+ & Na+ levels if she had blood work done. Can not get transportation until tomorrow afternoon. Referrals REFERRED TO PCP OFFICE Disagree/Comply: Comply

## 2015-07-25 ENCOUNTER — Encounter: Payer: Self-pay | Admitting: Internal Medicine

## 2015-07-25 ENCOUNTER — Ambulatory Visit (INDEPENDENT_AMBULATORY_CARE_PROVIDER_SITE_OTHER): Payer: Medicare Other | Admitting: Internal Medicine

## 2015-07-25 VITALS — BP 124/70 | HR 58 | Temp 98.1°F

## 2015-07-25 DIAGNOSIS — H538 Other visual disturbances: Secondary | ICD-10-CM

## 2015-07-25 DIAGNOSIS — R519 Headache, unspecified: Secondary | ICD-10-CM

## 2015-07-25 DIAGNOSIS — M7989 Other specified soft tissue disorders: Secondary | ICD-10-CM

## 2015-07-25 DIAGNOSIS — R339 Retention of urine, unspecified: Secondary | ICD-10-CM

## 2015-07-25 DIAGNOSIS — R140 Abdominal distension (gaseous): Secondary | ICD-10-CM | POA: Diagnosis not present

## 2015-07-25 DIAGNOSIS — R51 Headache: Secondary | ICD-10-CM

## 2015-07-25 LAB — POCT URINALYSIS DIPSTICK
Bilirubin, UA: NEGATIVE
Blood, UA: NEGATIVE
Glucose, UA: NEGATIVE
KETONES UA: NEGATIVE
LEUKOCYTES UA: NEGATIVE
NITRITE UA: NEGATIVE
PH UA: 6
PROTEIN UA: NEGATIVE
Spec Grav, UA: 1.015
UROBILINOGEN UA: NEGATIVE

## 2015-07-25 NOTE — Patient Instructions (Signed)
Bloating Bloating is the feeling of fullness in your belly. You may feel as though your pants are too tight. Often the cause of bloating is overeating, retaining fluids, or having gas in your bowel. It is also caused by swallowing air and eating foods that cause gas. Irritable bowel syndrome is one of the most common causes of bloating. Constipation is also a common cause. Sometimes more serious problems can cause bloating. SYMPTOMS  Usually there is a feeling of fullness, as though your abdomen is bulged out. There may be mild discomfort.  DIAGNOSIS  Usually no particular testing is necessary for most bloating. If the condition persists and seems to become worse, your caregiver may do additional testing.  TREATMENT   There is no direct treatment for bloating.  Do not put gas into the bowel. Avoid chewing gum and sucking on candy. These tend to make you swallow air. Swallowing air can also be a nervous habit. Try to avoid this.  Avoiding high residue diets will help. Eat foods with soluble fibers (examples include root vegetables, apples, or barley) and substitute dairy products with soy and rice products. This helps irritable bowel syndrome.  If constipation is the cause, then a high residue diet with more fiber will help.  Avoid carbonated beverages.  Over-the-counter preparations are available that help reduce gas. Your pharmacist can help you with this. SEEK MEDICAL CARE IF:   Bloating continues and seems to be getting worse.  You notice a weight gain.  You have a weight loss but the bloating is getting worse.  You have changes in your bowel habits or develop nausea or vomiting. SEEK IMMEDIATE MEDICAL CARE IF:   You develop shortness of breath or swelling in your legs.  You have an increase in abdominal pain or develop chest pain. Document Released: 10/01/2006 Document Revised: 02/23/2012 Document Reviewed: 11/19/2007 Exit[employer] Patient Information 2015 [employer]. This  information is not intended to replace advice given to you by your health care provider. Make sure you discuss any questions you have with your health care provider.  

## 2015-07-25 NOTE — Progress Notes (Signed)
Subjective:    Patient ID: [name], female    DOB: 10-May-1946, 69 y.o.   MRN: [id number]  HPI  Pt presents to the clinic today with multiple complaints.  She has been having headaches and blurred vision which started in June. The headaches are located in her forehead and radaite to the top of her head. She describes the pain as throbbing. The headaches usually last all day long. She denies nausea or vomiting. She denies sensitivity to light or sound. She does not take anything OTC for them, they just go away on their own. Her last eye exam was > 10 years ago. She is very concerned because she never gets headaches. She denies any new stressors.  She developed urinary retention that started in July. She was seen somewhere for this but can not remember where. She was advised to stop her Trimpex and start Macrobid for 5 days. She has 2 days left. She does feel better since starting the Macrobid. She is concerned because she has had kidney stones in the past. She denies low back pain or blood in her urine.  She c/o left lower leg swelling. This occurred for a few days this past week. She denies pain or redness. Her symptoms are improved. She denies numbness or tingling. She has never had swelling like this before and is insistent that it is not related to her MS.  She alsoc/o bloating in her stomach. This has been going on for a few weeks. The bloating is intermittent. She has been constipated but denies nausea or vomiting.Marland Kitchen Her last BM was yesterday which was normal. She denies abdominal pain, nausea or blood in her stool. She has not taken anything OTC for this.  Review of Systems  Past Medical History  Diagnosis Date  . Compression fracture 2011    Dr. Mardelle Matte   . MS (multiple sclerosis)     evaluated by Dr. Erling Cruz  . Toe fracture 1998    5  . Rib fractures 1998, [id number]    2  . Ankle fracture, right 10/30/06    [facility]  . Ankle fracture, left 07/2006  . Disc herniation     L5, S1    . MVP (mitral valve prolapse) 1979    ECHO. Trace MR, trace TR, trace PR 07/17/03  . Osteoporosis   . Nephrolithiasis     Current Outpatient Prescriptions  Medication Sig Dispense Refill  . nitrofurantoin, macrocrystal-monohydrate, (MACROBID) 100 MG capsule Take 100 mg by mouth 2 (two) times daily.     . ciprofloxacin (CIPRO) 250 MG tablet Take 1 tablet by mouth 2 (two) times daily.    Marland Kitchen trimethoprim (TRIMPEX) 100 MG tablet Take 100 mg by mouth daily. UTI preventative     No current facility-administered medications for this visit.    Allergies  Allergen Reactions  . Procaine Hcl     REACTION: UNSPECIFIED    Family History  Problem Relation Age of Onset  . Stroke Mother   . Neuropathy Brother   . Hypertension Neg Hx   . Diabetes Neg Hx   . Depression Neg Hx   . Alcohol abuse Neg Hx   . Drug abuse Neg Hx   . Stroke Father     Social History   Social History  . Marital Status: Widowed    Spouse Name: N/A  . Number of Children: 2  . Years of Education: HS   Occupational History  . Secretarial work---disabled    Social History  Main Topics  . Smoking status: Never Smoker   . Smokeless tobacco: Never Used  . Alcohol Use: No  . Drug Use: No  . Sexual Activity: Not on file   Other Topics Concern  . Not on file   Social History Narrative   Widowed 1991.  Lives alone.    Played piano.    Non ambulatory as of 2011 due to BLE weakness-from injury after back surgery   Caffeine Use: rarely     Constitutional: Pt reports headache. Denies fever, malaise, fatigue,  or abrupt weight changes.  HEENT: Denies eye pain, eye redness, ear pain, ringing in the ears, wax buildup, runny nose, nasal congestion, bloody nose, or sore throat. Respiratory: Denies difficulty breathing, shortness of breath, cough or sputum production.   Cardiovascular: Pt reports left leg swelling. Denies chest pain, chest tightness, palpitations or swelling in the hands.  Gastrointestinal: Pt reports  bloating and constipation. Denies abdominal pain, diarrhea or blood in the stool.  GU: Pt reports urinary retention. Denies urgency, frequency, pain with urination, burning sensation, blood in urine, odor or discharge. Musculoskeletal: Denies decrease in range of motion, difficulty with gait, muscle pain or joint pain and swelling.  Skin: Denies redness, rashes, lesions or ulcercations.  Neurological: Denies dizziness, difficulty with memory, difficulty with speech or problems with balance and coordination.  Psych: Denies anxiety, depression, SI/HI.  No other specific complaints in a complete review of systems (except as listed in HPI above).     Objective:   Physical Exam  BP 124/70 mmHg  Pulse 58  Temp(Src) 98.1 F (36.7 C) (Oral)  Wt   SpO2 98% Wt Readings from Last 3 Encounters:  08/06/10 125 lb (56.7 kg)  05/04/98 125 lb (56.7 kg)    General: Appears her stated age, in NAD. Skin: Warm, dry and intact. No rashes, lesions or ulcerations noted. HEENT: Head: normal shape and size; Eyes: sclera white, no icterus, conjunctiva pink, PERRLA and EOMs intact; Neck: No adenopathy noted. Cardiovascular: Normal rate and rhythm. S1,S2 noted.  No murmur, rubs or gallops noted. No LE swelling noted. Pulmonary/Chest: Normal effort and positive vesicular breath sounds. No respiratory distress. No wheezes, rales or ronchi noted.  Abdomen: Soft and nontender. Normal bowel sounds, no bruits noted. No distention or masses noted.  Musculoskeletal: Unable to assess as she can not get out of the wheelchair onto the table. Neurological: Alert and oriented.   BMET    Component Value Date/Time   NA 141 10/18/2012 1731   K 4.6 10/18/2012 1731   CL 104 10/18/2012 1731   CO2 29 10/18/2012 1731   GLUCOSE 97 10/18/2012 1731   BUN 13 10/18/2012 1731   CREATININE 0.7 10/18/2012 1731   CALCIUM 9.4 10/18/2012 1731   GFRNONAA >60 09/01/2007 1050   GFRAA  09/01/2007 1050    >60        The eGFR has been  calculated using the MDRD equation. This calculation has not been validated in all clinical    Lipid Panel  No results found for: CHOL, TRIG, HDL, CHOLHDL, VLDL, LDLCALC  CBC    Component Value Date/Time   WBC 6.6 10/18/2012 1731   RBC 4.26 10/18/2012 1731   HGB 13.2 10/18/2012 1731   HCT 40.7 10/18/2012 1731   PLT 354.0 10/18/2012 1731   MCV 95.5 10/18/2012 1731   MCHC 32.4 10/18/2012 1731   RDW 12.4 10/18/2012 1731   LYMPHSABS 2.2 10/18/2012 1731   MONOABS 0.5 10/18/2012 1731   EOSABS 0.1  10/18/2012 1731   BASOSABS 0.0 10/18/2012 1731    Hgb A1C No results found for: HGBA1C       Assessment & Plan:   Headache and blurred vision:  BP is fine No dizziness Advised her to make an appt for an eye exam  Bloating:  Will check CBC and CMET today Likely just r/t constipation Advised her to start Mirilax daily  Urinary retention:  Urinalysis today normal She will continue Macrobid at this time  LE swelling:  Resolved  Will continue to monitor at this time  RTC as needed or when due for followup with PCP

## 2015-07-26 LAB — CBC
HCT: 39.5 % (ref 36.0–46.0)
Hemoglobin: 13.1 g/dL (ref 12.0–15.0)
MCHC: 33.2 g/dL (ref 30.0–36.0)
MCV: 93.8 fl (ref 78.0–100.0)
PLATELETS: 355 10*3/uL (ref 150.0–400.0)
RBC: 4.22 Mil/uL (ref 3.87–5.11)
RDW: 12.5 % (ref 11.5–15.5)
WBC: 9.6 10*3/uL (ref 4.0–10.5)

## 2015-07-26 LAB — COMPREHENSIVE METABOLIC PANEL
ALBUMIN: 3.9 g/dL (ref 3.5–5.2)
ALK PHOS: 75 U/L (ref 39–117)
ALT: 16 U/L (ref 0–35)
AST: 22 U/L (ref 0–37)
BUN: 16 mg/dL (ref 6–23)
CALCIUM: 9.6 mg/dL (ref 8.4–10.5)
CHLORIDE: 103 meq/L (ref 96–112)
CO2: 31 mEq/L (ref 19–32)
Creatinine, Ser: 0.6 mg/dL (ref 0.40–1.20)
GFR: 105.26 mL/min (ref >60.00)
GLUCOSE: 94 mg/dL (ref 70–99)
POTASSIUM: 4.8 meq/L (ref 3.5–5.1)
SODIUM: 139 meq/L (ref 135–145)
TOTAL PROTEIN: 6.9 g/dL (ref 6.0–8.3)
Total Bilirubin: 0.2 mg/dL (ref 0.2–1.2)

## 2015-09-12 ENCOUNTER — Telehealth: Payer: Self-pay | Admitting: Internal Medicine

## 2015-09-12 NOTE — Telephone Encounter (Signed)
Pt called stating her urology dr dalstead said she could bring a urine sample here to be test for an UTI if she has symptoms  I told pt we normally don't do that .  She would need an appointment  Pt wanted me to check to see if she could bring sample in  She is hurting in back and right side.  She is unable to control her urine

## 2015-09-13 NOTE — Telephone Encounter (Signed)
Spoke with patient and she is going to see her urologist tomorrow.

## 2016-04-30 ENCOUNTER — Other Ambulatory Visit: Payer: Self-pay | Admitting: Orthopedic Surgery

## 2016-04-30 DIAGNOSIS — M542 Cervicalgia: Secondary | ICD-10-CM

## 2016-05-07 ENCOUNTER — Ambulatory Visit
Admission: RE | Admit: 2016-05-07 | Discharge: 2016-05-07 | Disposition: A | Discharge status: Home / Self Care | Payer: Medicare Other | Source: Ambulatory Visit | Attending: Orthopedic Surgery | Admitting: Orthopedic Surgery

## 2016-05-07 DIAGNOSIS — M542 Cervicalgia: Secondary | ICD-10-CM

## 2016-06-12 ENCOUNTER — Encounter: Payer: Self-pay | Admitting: Internal Medicine

## 2016-06-12 ENCOUNTER — Ambulatory Visit (INDEPENDENT_AMBULATORY_CARE_PROVIDER_SITE_OTHER): Payer: Medicare Other | Admitting: Internal Medicine

## 2016-06-12 VITALS — BP 118/60 | HR 60 | Temp 98.1°F

## 2016-06-12 DIAGNOSIS — G35 Multiple sclerosis: Secondary | ICD-10-CM

## 2016-06-12 DIAGNOSIS — M4802 Spinal stenosis, cervical region: Secondary | ICD-10-CM | POA: Diagnosis not present

## 2016-06-12 DIAGNOSIS — M19012 Primary osteoarthritis, left shoulder: Secondary | ICD-10-CM | POA: Diagnosis not present

## 2016-06-12 NOTE — Assessment & Plan Note (Signed)
Findings separate from the neck

## 2016-06-12 NOTE — Assessment & Plan Note (Signed)
Stable Functions okay independently

## 2016-06-12 NOTE — Assessment & Plan Note (Signed)
Reviewed MRI--has spinal stenosis and not really a disc  No arm weakness Would not recommend surgery but she will go to Dr Jeral Fruit Not excited about taking meds

## 2016-06-12 NOTE — Progress Notes (Signed)
   Subjective:    Patient ID: [name], female    DOB: 1946/10/12, 70 y.o.   MRN: [id number]  HPI Here with son [name]--- due to neck pain  Started in January and went to Dr Eulah Pont last month Found "bulging disc" Pain comes around neck to ear and jaw Radiates up the back of her head Goes to left arm and shoulder  Referred to Dr Jeral Fruit  Does use ibuprofen 200mg  --- but rarely Would help a little No arm weakness Trouble turning head to left  No current outpatient prescriptions on file prior to visit.   No current facility-administered medications on file prior to visit.    Allergies  Allergen Reactions  . Procaine Hcl     REACTION: UNSPECIFIED    Past Medical History  Diagnosis Date  . Compression fracture 2011    Dr. Dion Saucier   . MS (multiple sclerosis) [facility])     evaluated by Dr. Sandria Manly  . Toe fracture 1998    5  . Rib fractures 1998, [id number]    2  . Ankle fracture, right 10/30/06    [facility]  . Ankle fracture, left 07/2006  . Disc herniation     L5, S1  . MVP (mitral valve prolapse) 1979    ECHO. Trace MR, trace TR, trace PR 07/17/03  . Osteoporosis   . Nephrolithiasis     Past Surgical History  Procedure Laterality Date  . Total abdominal hysterectomy w/ bilateral salpingoophorectomy  1982    for endometriosis  . Septoplasty  1983    deviated septum  . Lumbar disc surgery  04/2007    L5, S1- [facility]  . Tonsillectomy and adenoidectomy  1960    Family History  Problem Relation Age of Onset  . Stroke Mother   . Neuropathy Brother   . Hypertension Neg Hx   . Diabetes Neg Hx   . Depression Neg Hx   . Alcohol abuse Neg Hx   . Drug abuse Neg Hx   . Stroke Father     Social History   Social History  . Marital Status: Widowed    Spouse Name: N/A  . Number of Children: 2  . Years of Education: HS   Occupational History  . Secretarial work---disabled    Social History Main Topics  . Smoking status: Never Smoker   . Smokeless tobacco: Never Used  .  Alcohol Use: No  . Drug Use: No  . Sexual Activity: Not on file   Other Topics Concern  . Not on file   Social History Narrative   Widowed 1991.  Lives alone.    Played piano.    Non ambulatory as of 2011 due to BLE weakness-from injury after back surgery   Caffeine Use: rarely   Review of Systems No difference in her leg strength Just in wheelchair Gives herself bed bath Independent with bathroom Family helps with shopping--she does simple cooking    Objective:   Physical Exam  Constitutional: She appears well-developed and well-nourished. No distress.  Neck:  Decreased extension and tilt (bilaterally)  Musculoskeletal:  Pain with external rotation of shoulder Crepitus in shoulder  Neurological:  Normal strength in arms          Assessment & Plan:

## 2016-06-12 NOTE — Progress Notes (Signed)
Pre visit review using our clinic review tool, if applicable. No additional management support is needed unless otherwise documented below in the visit note. 

## 2016-06-12 NOTE — Patient Instructions (Signed)
Please try ibuprofen  twice a day--for a while-- to see how it helps. It would probably be safe to continue this indefinitely

## 2016-06-13 LAB — COMPREHENSIVE METABOLIC PANEL
ALT: 26 U/L (ref 0–35)
AST: 28 U/L (ref 0–37)
Albumin: 4.1 g/dL (ref 3.5–5.2)
Alkaline Phosphatase: 96 U/L (ref 39–117)
BILIRUBIN TOTAL: 0.2 mg/dL (ref 0.2–1.2)
BUN: 19 mg/dL (ref 6–23)
CALCIUM: 10.1 mg/dL (ref 8.4–10.5)
CHLORIDE: 104 meq/L (ref 96–112)
CO2: 32 meq/L (ref 19–32)
CREATININE: 0.63 mg/dL (ref 0.40–1.20)
GFR: 99.24 mL/min (ref >60.00)
Glucose, Bld: 100 mg/dL — ABNORMAL HIGH (ref 70–99)
Potassium: 4.5 mEq/L (ref 3.5–5.1)
SODIUM: 140 meq/L (ref 135–145)
Total Protein: 7.3 g/dL (ref 6.0–8.3)

## 2016-06-13 LAB — CBC WITH DIFFERENTIAL/PLATELET
BASOS ABS: 0 10*3/uL (ref 0.0–0.1)
Basophils Relative: 0.4 % (ref 0.0–3.0)
EOS ABS: 0.1 10*3/uL (ref 0.0–0.7)
Eosinophils Relative: 1.6 % (ref 0.0–5.0)
HEMATOCRIT: 40.2 % (ref 36.0–46.0)
HEMOGLOBIN: 13.4 g/dL (ref 12.0–15.0)
LYMPHS PCT: 31.4 % (ref 12.0–46.0)
Lymphs Abs: 2.5 10*3/uL (ref 0.7–4.0)
MCHC: 33.3 g/dL (ref 30.0–36.0)
MCV: 92.1 fl (ref 78.0–100.0)
MONO ABS: 0.7 10*3/uL (ref 0.1–1.0)
Monocytes Relative: 9.3 % (ref 3.0–12.0)
NEUTROS ABS: 4.6 10*3/uL (ref 1.4–7.7)
Neutrophils Relative %: 57.3 % (ref 43.0–77.0)
PLATELETS: 368 10*3/uL (ref 150.0–400.0)
RBC: 4.37 Mil/uL (ref 3.87–5.11)
RDW: 12.6 % (ref 11.5–15.5)
WBC: 8 10*3/uL (ref 4.0–10.5)

## 2016-06-20 ENCOUNTER — Telehealth: Payer: Self-pay | Admitting: Internal Medicine

## 2016-06-20 NOTE — Telephone Encounter (Signed)
Pt is requesting a cb to discuss glucose levels from prev lab results. She is concerned about poss diabetes and plan of action, if nec. Please call [phone].

## 2016-06-20 NOTE — Telephone Encounter (Signed)
Spoke to pt. She said it is hard to exercise in a wheelchair. We discussed cutting back on carbs.

## 2016-06-20 NOTE — Telephone Encounter (Signed)
The normal range used to be 109---but they lowered it to 99 some years ago. I consider 100 to be a normal variant--- so other than a healthy lifestyle (proper eating and regular exercise), no action is indicated

## 2016-06-30 ENCOUNTER — Other Ambulatory Visit: Payer: Self-pay | Admitting: Neurosurgery

## 2016-06-30 DIAGNOSIS — M4802 Spinal stenosis, cervical region: Secondary | ICD-10-CM

## 2016-07-07 ENCOUNTER — Ambulatory Visit
Admission: RE | Admit: 2016-07-07 | Discharge: 2016-07-07 | Disposition: A | Discharge status: Home / Self Care | Payer: Medicare Other | Source: Ambulatory Visit | Attending: Neurosurgery | Admitting: Neurosurgery

## 2016-07-07 DIAGNOSIS — M4802 Spinal stenosis, cervical region: Secondary | ICD-10-CM

## 2016-07-07 MED ORDER — TRIAMCINOLONE ACETONIDE 40 MG/ML IJ SUSP (RADIOLOGY)
60.0000 mg | Freq: Once | INTRAMUSCULAR | Status: AC
  Administered 2016-07-07: 60 mg via EPIDURAL

## 2016-07-07 MED ORDER — IOPAMIDOL (ISOVUE-M 300) INJECTION 61%
1.0000 mL | Freq: Once | INTRAMUSCULAR | Status: AC | PRN
  Administered 2016-07-07: 1 mL via EPIDURAL

## 2016-07-07 NOTE — Discharge Instructions (Signed)

## 2016-08-12 ENCOUNTER — Other Ambulatory Visit: Payer: Self-pay

## 2016-08-26 ENCOUNTER — Encounter: Payer: Self-pay | Admitting: Family Medicine

## 2016-08-26 ENCOUNTER — Ambulatory Visit (INDEPENDENT_AMBULATORY_CARE_PROVIDER_SITE_OTHER): Payer: Medicare Other | Admitting: Family Medicine

## 2016-08-26 VITALS — BP 114/64 | HR 72 | Temp 97.8°F

## 2016-08-26 DIAGNOSIS — M7989 Other specified soft tissue disorders: Secondary | ICD-10-CM

## 2016-08-26 LAB — COMPREHENSIVE METABOLIC PANEL
ALK PHOS: 87 U/L (ref 33–130)
ALT: 18 U/L (ref 6–29)
AST: 22 U/L (ref 10–35)
Albumin: 4.1 g/dL (ref 3.6–5.1)
BUN: 15 mg/dL (ref 7–25)
CALCIUM: 9.8 mg/dL (ref 8.6–10.4)
CHLORIDE: 104 mmol/L (ref 98–110)
CO2: 32 mmol/L — ABNORMAL HIGH (ref 20–31)
Creat: 0.65 mg/dL (ref 0.60–0.93)
Glucose, Bld: 110 mg/dL — ABNORMAL HIGH (ref 65–99)
POTASSIUM: 4.8 mmol/L (ref 3.5–5.3)
Sodium: 141 mmol/L (ref 135–146)
TOTAL PROTEIN: 6.6 g/dL (ref 6.1–8.1)
Total Bilirubin: 0.3 mg/dL (ref 0.2–1.2)

## 2016-08-26 LAB — D-DIMER, QUANTITATIVE (NOT AT ARMC): D DIMER QUANT: 0.67 ug{FEU}/mL — AB (ref <0.50)

## 2016-08-26 NOTE — Patient Instructions (Signed)
Go to the lab on the way out.  We'll contact you with your lab report. Take care. If you have chest pain or get short of breath, then go to the ER.  Take care.  Glad to see you.

## 2016-08-26 NOTE — Progress Notes (Signed)
H/o MS. L ankle swelling for about 7-10 days.  Steady, day and night, ie minimal change at night.  No trauma. Not painful.    Minimal R ankle swelling, only slightly more than baseline.  No CP, not SOB.  No new meds.    Some abd bloating.    In wheelchair at baseline.    Meds, vitals, and allergies reviewed.   ROS: Per HPI unless specifically indicated in ROS section   nad ncat Neck supple, no LA rrr ctab abd soft Ext with trace R ankle edema.  L ankle and foot 2+ BLE, not red or tender.   L calf not puffy but slightly ttp.  Normal L DP pulse.

## 2016-08-26 NOTE — Assessment & Plan Note (Addendum)
Minimal R ankle edema.  The concern is for L leg DVT.  Relatively immobile. No h/o DVT.  Not anticoagulated yet.  Check labs.  If D dimer pos, then will need u/s.  If CP or SOB, to ER.  No CP or SOB now.  She agrees with plan.   ----------------------------- Addendum positive d-dimer. See notes on labs. Ultrasound ordered. If positive would be reasonable to start Xarelto.

## 2016-08-26 NOTE — Progress Notes (Signed)
Pre visit review using our clinic review tool, if applicable. No additional management support is needed unless otherwise documented below in the visit note. 

## 2016-08-27 ENCOUNTER — Telehealth: Payer: Self-pay

## 2016-08-27 ENCOUNTER — Ambulatory Visit (HOSPITAL_COMMUNITY)
Admission: RE | Admit: 2016-08-27 | Discharge: 2016-08-27 | Disposition: A | Discharge status: Home / Self Care | Payer: Medicare Other | Source: Ambulatory Visit | Attending: Cardiovascular Disease | Admitting: Cardiovascular Disease

## 2016-08-27 DIAGNOSIS — E785 Hyperlipidemia, unspecified: Secondary | ICD-10-CM | POA: Insufficient documentation

## 2016-08-27 DIAGNOSIS — M7989 Other specified soft tissue disorders: Secondary | ICD-10-CM | POA: Diagnosis not present

## 2016-08-27 DIAGNOSIS — R791 Abnormal coagulation profile: Secondary | ICD-10-CM | POA: Insufficient documentation

## 2016-08-27 NOTE — Telephone Encounter (Signed)
Vascular lab called report lower extremity DVT was negative; call transferred to Dr Para March.

## 2016-08-27 NOTE — Telephone Encounter (Signed)
Late entry.  Discussed with patient about negative Doppler. No DVT. Discussed with her about options. Reasonable to try compression stockings in the meantime. If no improvement with that, then she can update [place]. She agrees.

## 2016-08-28 ENCOUNTER — Telehealth: Payer: Self-pay | Admitting: Internal Medicine

## 2016-08-28 ENCOUNTER — Encounter: Payer: Self-pay | Admitting: Internal Medicine

## 2016-08-28 ENCOUNTER — Ambulatory Visit (INDEPENDENT_AMBULATORY_CARE_PROVIDER_SITE_OTHER): Payer: Medicare Other | Admitting: Internal Medicine

## 2016-08-28 VITALS — BP 120/70 | HR 79 | Temp 98.1°F

## 2016-08-28 DIAGNOSIS — M7989 Other specified soft tissue disorders: Secondary | ICD-10-CM | POA: Diagnosis not present

## 2016-08-28 LAB — POC URINALSYSI DIPSTICK (AUTOMATED)
BILIRUBIN UA: NEGATIVE
Blood, UA: NEGATIVE
GLUCOSE UA: NEGATIVE
KETONES UA: NEGATIVE
LEUKOCYTES UA: NEGATIVE
Nitrite, UA: NEGATIVE
SPEC GRAV UA: 1.025
Urobilinogen, UA: 0.2
pH, UA: 7

## 2016-08-28 NOTE — Telephone Encounter (Signed)
Will await the evaluation today

## 2016-08-28 NOTE — Progress Notes (Signed)
Subjective:    Patient ID: [name], female    DOB: Jun 11, 1946, 70 y.o.   MRN: [id number]  HPI  Pt presents to the clinic today to follow up left leg swelling. She was seen by Dr. Damita Dunnings 9/12 for the same. Dr. Damita Dunnings was concerned that she may have a left leg DVT. D dimer was slightly positive so he ordered a doppler of the left leg which was negative. She reports the swelling has continued, no worse and a little better with wearing the TED hose. She reports Dr. Damita Dunnings advised her the next step would be to check her urine for protein. She has given a urine sample in the office today. She is concerned about the swelling because she is going to [facility] next week and she has no one to put her TED hose on.  Review of Systems      Past Medical History:  Diagnosis Date  . Ankle fracture, left 07/2006  . Ankle fracture, right 10/30/06   [facility]  . Compression fracture 2011   Dr. Mardelle Matte   . Disc herniation    L5, S1  . MS (multiple sclerosis) ([place])    evaluated by Dr. Erling Cruz  . MVP (mitral valve prolapse) 1979   ECHO. Trace MR, trace TR, trace PR 07/17/03  . Nephrolithiasis   . Osteoporosis   . Rib fractures 1998, [id number]   2  . Toe fracture 1998   5    No current outpatient prescriptions on file.   No current facility-administered medications for this visit.     Allergies  Allergen Reactions  . Procaine Hcl     REACTION: UNSPECIFIED    Family History  Problem Relation Age of Onset  . Stroke Mother   . Neuropathy Brother   . Stroke Father   . Hypertension Neg Hx   . Diabetes Neg Hx   . Depression Neg Hx   . Alcohol abuse Neg Hx   . Drug abuse Neg Hx     Social History   Social History  . Marital status: Widowed    Spouse name: N/A  . Number of children: 2  . Years of education: HS   Occupational History  . Secretarial work---disabled    Social History Main Topics  . Smoking status: Never Smoker  . Smokeless tobacco: Never Used  . Alcohol use No    . Drug use: No  . Sexual activity: Not on file   Other Topics Concern  . Not on file   Social History Narrative   Widowed 1991.  Lives alone.    Played piano.    Non ambulatory as of 2011 due to BLE weakness-from injury after back surgery   Caffeine Use: rarely     Constitutional: Pt reports fatigue. Denies fever, malaise, headache or abrupt weight changes.  Respiratory: Denies difficulty breathing, shortness of breath, cough or sputum production.   Cardiovascular: Pt reports lower extremity edema. Denies chest pain, chest tightness, palpitations or swelling in the hands.  Musculoskeletal: Pt reports generalized weakness. Denies decrease in range of motion, difficulty with gait, or joint pain and swelling.  Skin: Denies redness, rashes, lesions or ulcercations.    No other specific complaints in a complete review of systems (except as listed in HPI above).  Objective:   Physical Exam   BP 120/70 (BP Location: Right Arm, Patient Position: Sitting, Cuff Size: Normal)   Pulse 79   Temp 98.1 F (36.7 C) (Oral)   SpO2 92%  Wt Readings from Last 3 Encounters:  08/06/10 125 lb (56.7 kg)  05/04/98 125 lb (56.7 kg)    General: Appears her stated age, in NAD. Skin: Warm, dry and intact. No rashes, lesions or ulcerations noted.  Cardiovascular: Normal rate and rhythm. S1,S2 noted.  No murmur, rubs or gallops noted. Trace BLE edema, non pitting. Pulmonary/Chest: Normal effort and positive vesicular breath sounds. No respiratory distress. No wheezes, rales or ronchi noted.  Musculoskeletal: In wheelchair, muscle wasting noted of lower extremities.  Neurological: Alert and oriented.   BMET    Component Value Date/Time   NA 141 08/26/2016 1642   K 4.8 08/26/2016 1642   CL 104 08/26/2016 1642   CO2 32 (H) 08/26/2016 1642   GLUCOSE 110 (H) 08/26/2016 1642   BUN 15 08/26/2016 1642   CREATININE 0.65 08/26/2016 1642   CALCIUM 9.8 08/26/2016 1642   GFRNONAA >60 09/01/2007 1050    GFRAA  09/01/2007 1050    >60        The eGFR has been calculated using the MDRD equation. This calculation has not been validated in all clinical    Lipid Panel  No results found for: CHOL, TRIG, HDL, CHOLHDL, VLDL, LDLCALC  CBC    Component Value Date/Time   WBC 8.0 06/12/2016 1728   RBC 4.37 06/12/2016 1728   HGB 13.4 06/12/2016 1728   HCT 40.2 06/12/2016 1728   PLT 368.0 06/12/2016 1728   MCV 92.1 06/12/2016 1728   MCHC 33.3 06/12/2016 1728   RDW 12.6 06/12/2016 1728   LYMPHSABS 2.5 06/12/2016 1728   MONOABS 0.7 06/12/2016 1728   EOSABS 0.1 06/12/2016 1728   BASOSABS 0.0 06/12/2016 1728    Hgb A1C No results found for: HGBA1C      Assessment & Plan:   BLE edema:  Not severe Ultrasound and labs reviewed with Dr. Silvio Pate Improved with TED hose- advised her to continue this, on during the day, off at night Urinalysis- trace protein, Dr. Silvio Pate not concerned about this No further intervention at this time  Follow up with PCP if worse Webb Silversmith, NP

## 2016-08-28 NOTE — Progress Notes (Signed)
Pre visit review using our clinic review tool, if applicable. No additional management support is needed unless otherwise documented below in the visit note. 

## 2016-08-28 NOTE — Patient Instructions (Signed)
Edema °Edema is an abnormal buildup of fluids in your body tissues. Edema is somewhat dependent on gravity to pull the fluid to the lowest place in your body. That makes the condition more common in the legs and thighs (lower extremities). Painless swelling of the feet and ankles is common and becomes more likely as you get older. It is also common in looser tissues, like around your eyes.  °When the affected area is squeezed, the fluid may move out of that spot and leave a dent for a few moments. This dent is called pitting.  °CAUSES  °There are many possible causes of edema. Eating too much salt and being on your feet or sitting for a long time can cause edema in your legs and ankles. Hot weather may make edema worse. Common medical causes of edema include: °· Heart failure. °· Liver disease. °· Kidney disease. °· Weak blood vessels in your legs. °· Cancer. °· An injury. °· Pregnancy. °· Some medications. °· Obesity.  °SYMPTOMS  °Edema is usually painless. Your skin may look swollen or shiny.  °DIAGNOSIS  °Your health care provider may be able to diagnose edema by asking about your medical history and doing a physical exam. You may need to have tests such as X-rays, an electrocardiogram, or blood tests to check for medical conditions that may cause edema.  °TREATMENT  °Edema treatment depends on the cause. If you have heart, liver, or kidney disease, you need the treatment appropriate for these conditions. General treatment may include: °· Elevation of the affected body part above the level of your heart. °· Compression of the affected body part. Pressure from elastic bandages or support stockings squeezes the tissues and forces fluid back into the blood vessels. This keeps fluid from entering the tissues. °· Restriction of fluid and salt intake. °· Use of a water pill (diuretic). These medications are appropriate only for some types of edema. They pull fluid out of your body and make you urinate more often. This  gets rid of fluid and reduces swelling, but diuretics can have side effects. Only use diuretics as directed by your health care provider. °HOME CARE INSTRUCTIONS  °· Keep the affected body part above the level of your heart when you are lying down.   °· Do not sit still or stand for prolonged periods.   °· Do not put anything directly under your knees when lying down. °· Do not wear constricting clothing or garters on your upper legs.   °· Exercise your legs to work the fluid back into your blood vessels. This may help the swelling go down.   °· Wear elastic bandages or support stockings to reduce ankle swelling as directed by your health care provider.   °· Eat a low-salt diet to reduce fluid if your health care provider recommends it.   °· Only take medicines as directed by your health care provider.  °SEEK MEDICAL CARE IF:  °· Your edema is not responding to treatment. °· You have heart, liver, or kidney disease and notice symptoms of edema. °· You have edema in your legs that does not improve after elevating them.   °· You have sudden and unexplained weight gain. °SEEK IMMEDIATE MEDICAL CARE IF:  °· You develop shortness of breath or chest pain.   °· You cannot breathe when you lie down. °· You develop pain, redness, or warmth in the swollen areas.   °· You have heart, liver, or kidney disease and suddenly get edema. °· You have a fever and your symptoms suddenly get worse. °MAKE SURE YOU:  °·   Understand these instructions. °· Will watch your condition. °· Will get help right away if you are not doing well or get worse. °  °This information is not intended to replace advice given to you by your health care provider. Make sure you discuss any questions you have with your health care provider. °  °Document Released: 12/01/2005 Document Revised: 12/22/2014 Document Reviewed: 09/23/2013 °Elsevier Interactive Patient Education ©2016 [employer] ° °

## 2016-08-28 NOTE — Telephone Encounter (Signed)
Pt has appt with Pamala Hurry NP on 08/28/16 at 4:15.

## 2016-08-28 NOTE — Telephone Encounter (Signed)
I had talked to patient about this prev.  May be reasonable to check for protein, ie if nephrotic.  She didn't have dysuria, I didn't suspect UTI.   If swelling continues, it maybe reasonable to check for proximate cause of swelling, ie checking her thigh/pelvis.  Update me if I can be of service.   Thanks.

## 2016-08-28 NOTE — Telephone Encounter (Signed)
[facility] Day - Client TELEPHONE ADVICE RECORD [facility] Patient Name: [name] DOB: 1946-01-21 Initial Comment Caller says, lab work 2 days ago, thought she would have blood clots, but ultra sound did not disclose anything. She has been wearing compression socks for 2 days for swelling in Lt foot. She wants to know if she can bring in a urine sample to be tested Nurse Assessment Nurse: Debera Lat, RN, Tinnie Gens Date/Time Lamount Cohen Time): 08/28/2016 9:32:22 AM Confirm and document reason for call. If symptomatic, describe symptoms. You must click the next button to save text entered. ---Caller says, lab work 2 days ago, thought she would have blood clots, but ultra sound did not disclose anything. She has been wearing compression socks for 2 days for swelling in Lt foot. She wants to know if she can bring in a urine sample to be tested. No fever. Has the patient traveled out of the country within the last 30 days? ---No Does the patient have any new or worsening symptoms? ---Yes Will a triage be completed? ---Yes Related visit to physician within the last 2 weeks? ---Yes Does the PT have any chronic conditions? (i.e. diabetes, asthma, etc.) ---Yes List chronic conditions. ---Multiple sclerosis Is this a behavioral health or substance abuse call? ---No Guidelines Guideline Title Affirmed Question Affirmed Notes Leg Swelling and Edema [1] Thigh, calf, or ankle swelling AND [2] only 1 side Final Disposition User See Physician within 4 Hours (or PCP triage) Debera Lat, RN, Tinnie Gens Comments Caller could not come for appointment in suggested time frame from outcome due to transportation issues so next available appointment was scheduled when she could come in. Referrals REFERRED TO PCP OFFICE Disagree/Comply: Comply

## 2016-08-29 ENCOUNTER — Encounter: Payer: Self-pay | Admitting: Internal Medicine

## 2016-09-09 ENCOUNTER — Telehealth: Payer: Self-pay | Admitting: Internal Medicine

## 2016-09-09 NOTE — Telephone Encounter (Signed)
Pt has appt 09/10/16 at 6pm with Dr Para Marchuncan.

## 2016-09-09 NOTE — Telephone Encounter (Signed)
Noted. Thanks. Will see at OV.  

## 2016-09-09 NOTE — Telephone Encounter (Signed)
[facility] Day - Client TELEPHONE ADVICE RECORD [facility] Patient Name: [name] DOB: 1946-08-28 Initial Comment Caller states she was seen last week, tested for a blood clot. Her left foot and leg is swelled. Right is starting to swell. Starting to get nosebleeds. Nurse Assessment Nurse: Debera Lat, RN, Tinnie Gens Date/Time Lamount Cohen Time): 09/09/2016 8:59:24 AM Confirm and document reason for call. If symptomatic, describe symptoms. You must click the next button to save text entered. ---Caller states she was seen last week, tested for a blood clot. Her left foot and leg is swelled. Right is starting to swell. Starting to get notice blood when she wipes her nose. Has had leg swelling for 3 weeks. Previous US was negative for DVT. Has the patient traveled out of the country within the last 30 days? ---No Does the patient have any new or worsening symptoms? ---Yes Will a triage be completed? ---Yes Related visit to physician within the last 2 weeks? ---Yes Does the PT have any chronic conditions? (i.e. diabetes, asthma, etc.) ---No Is this a behavioral health or substance abuse call? ---No Guidelines Guideline Title Affirmed Question Affirmed Notes Leg Swelling and Edema [1] MODERATE leg swelling (e.g., swelling extends up to knees) AND [2] new onset or worsening Final Disposition User See Physician within 24 Hours Humphrey, RN, Tinnie Gens Comments Caller reports that appointment already scheduled with Dr. Para March for 6:00 p.m. for 09/10/16. No appointment available for today when caller requested. Due to transportation issues she is unable to be seen until after her son gets off work at 4 p.m. Advised caller that she would need to go to ED if symptoms worsen but otherwise keep scheduled appointment with MD. Referrals REFERRED TO PCP OFFICE Disagree/Comply: Comply

## 2016-09-10 ENCOUNTER — Ambulatory Visit (INDEPENDENT_AMBULATORY_CARE_PROVIDER_SITE_OTHER): Payer: Medicare Other | Admitting: Family Medicine

## 2016-09-10 ENCOUNTER — Encounter: Payer: Self-pay | Admitting: Family Medicine

## 2016-09-10 ENCOUNTER — Ambulatory Visit: Payer: Medicare Other | Admitting: Family Medicine

## 2016-09-10 DIAGNOSIS — M7989 Other specified soft tissue disorders: Secondary | ICD-10-CM

## 2016-09-10 NOTE — Progress Notes (Signed)
H/o L ankle swelling with neg u/s, no DVT seen.  Tried stocking in the meantime.  She was able to use it most days in the meantime.  She would still have some swelling with the stocking, but less in general.  If she doesn't wear it, she'll have return of swelling.  Last night it felt warm after taking the stocking off, locally warm. It is still painful, esp getting the stocking off and on.    She was worried about recurrent sx on the L leg.    She has had occasional scant bleeding per either nostril. No other bleeding noted.  Meds, vitals, and allergies reviewed.   ROS: Per HPI unless specifically indicated in ROS section   GEN: nad, alert and oriented HEENT: mucous membranes moist, nasal exam within normal limits. OP exam within normal limits. NECK: supple w/o LA CV: rrr.  PULM: ctab, no inc wob ABD: soft, +bs EXT:  L>R BLE edema, mainly at the L ankle SKIN: no acute rash

## 2016-09-10 NOTE — Progress Notes (Signed)
Pre visit review using our clinic review tool, if applicable. No additional management support is needed unless otherwise documented below in the visit note. 

## 2016-09-10 NOTE — Assessment & Plan Note (Addendum)
She still has left leg swelling. Previous negative for DVT. Continue compression sock for now. I will defer to PCP about how aggressive to be in her workup otherwise. She does not have symmetric swelling. I doubt that she has an ongoing issue in the pelvis that would be causing her symptoms on the left leg. I would appreciate PCP input. Patient did not want to be on a diuretic.  Patient has minimal symptoms with occasional scant amounts of blood per either nostril. Reassured about her normal exam today.

## 2016-09-10 NOTE — Patient Instructions (Signed)
Try to use the stocking in the meantime and let me talk to Dr. Alphonsus SiasLetvak.  Take care.  Glad to see you.

## 2016-09-15 ENCOUNTER — Telehealth: Payer: Self-pay | Admitting: *Deleted

## 2016-09-15 NOTE — Telephone Encounter (Addendum)
-----   Message from Joaquim Nam, MD sent at 09/14/2016  8:48 PM EDT ----- Please call pt.  I d/w Letvak.  Likely needs to continue compression and elevation. Should schedule with Letvak in next couple of weeks to review progress. Thanks.     Left detailed message on voicemail.

## 2016-10-15 ENCOUNTER — Telehealth: Payer: Self-pay | Admitting: Internal Medicine

## 2016-10-15 NOTE — Telephone Encounter (Signed)
Called pt. No answer. No voicemail.  It is time to schedule Medicare Wellness with Dr. Alphonsus Sias.

## 2017-05-13 ENCOUNTER — Telehealth: Payer: Self-pay | Admitting: Internal Medicine

## 2017-05-13 NOTE — Telephone Encounter (Signed)
[name] dropped off form for disability parking placard. Placed in RX tower. Also wanted to know if there's any way for her to get a permanent one.

## 2017-05-14 NOTE — Telephone Encounter (Signed)
Form done No charge  I could do a permanent but it is actually a different form

## 2017-05-14 NOTE — Telephone Encounter (Signed)
I notified [name] form is ready for pick up and Dr.Letvak would need a different form for permanent disability.

## 2017-05-14 NOTE — Telephone Encounter (Signed)
Form placed in Dr Letvak's inbox on his desk 

## 2017-06-04 ENCOUNTER — Telehealth: Payer: Self-pay | Admitting: Internal Medicine

## 2017-06-04 NOTE — Telephone Encounter (Signed)
Spoke to pt. She has to talk with her son about scheduling Medicare Wellness because he is her transportation. Pt will call back.

## 2017-07-02 ENCOUNTER — Encounter: Payer: Self-pay | Admitting: Internal Medicine

## 2017-07-02 ENCOUNTER — Ambulatory Visit (INDEPENDENT_AMBULATORY_CARE_PROVIDER_SITE_OTHER): Payer: Medicare Other | Admitting: Internal Medicine

## 2017-07-02 VITALS — BP 116/66 | HR 55 | Temp 97.8°F | Wt 113.0 lb

## 2017-07-02 DIAGNOSIS — G8220 Paraplegia, unspecified: Secondary | ICD-10-CM

## 2017-07-02 DIAGNOSIS — Z0000 Encounter for general adult medical examination without abnormal findings: Secondary | ICD-10-CM | POA: Diagnosis not present

## 2017-07-02 DIAGNOSIS — G35 Multiple sclerosis: Secondary | ICD-10-CM | POA: Diagnosis not present

## 2017-07-02 DIAGNOSIS — N390 Urinary tract infection, site not specified: Secondary | ICD-10-CM | POA: Diagnosis not present

## 2017-07-02 NOTE — Assessment & Plan Note (Signed)
Doing well Stable in wheelchair

## 2017-07-02 NOTE — Assessment & Plan Note (Signed)
Wheelchair bound but independent at home

## 2017-07-02 NOTE — Assessment & Plan Note (Signed)
Has done better on the methenamine Will give Rx for antibiotic for when she has recurrences (she will call with what she has had most recently--didn't do well with macrodantin and want to avoid cipro)

## 2017-07-02 NOTE — Progress Notes (Signed)
   Subjective:    Patient ID: [name], female    DOB: [birth year], 71 y.o.   MRN: [id number]  HPI Here for recheck of multiple medical issues Has been on methanamine --to prevent her frequent UTIs Dr [name] has prescribed but he retired-- now going to Dr Marlou Porch Has been on antibiotic prophylaxis--but that was stopped Seems to be doing well on this  No change in MS Doesn't walk--- but can transfer Son gets the groceries-- she cooks and does the instrumental ADLs Some incontinence  No major neck pain No arm weakness Did have a cortisone shot in thoracic back 1-2 years ago--this helped  No current outpatient prescriptions on file prior to visit.   No current facility-administered medications on file prior to visit.     Allergies  Allergen Reactions  . Procaine Hcl     REACTION: UNSPECIFIED    Past Medical History:  Diagnosis Date  . Ankle fracture, left 07/2006  . Ankle fracture, right 10/30/06   [facility]  . Compression fracture 2011   Dr. Dion Saucier   . Disc herniation    L5, S1  . MS (multiple sclerosis) (HCC)    evaluated by Dr. Sandria Manly  . MVP (mitral valve prolapse) 1979   ECHO. Trace MR, trace TR, trace PR 07/17/03  . Nephrolithiasis   . Osteoporosis   . Rib fractures 1998, [id number]   2  . Toe fracture 1998   5    Past Surgical History:  Procedure Laterality Date  . LUMBAR DISC SURGERY  04/2007   L5, S1- [facility]  . SEPTOPLASTY  1983   deviated septum  . TONSILLECTOMY AND ADENOIDECTOMY  1960  . TOTAL ABDOMINAL HYSTERECTOMY W/ BILATERAL SALPINGOOPHORECTOMY  1982   for endometriosis    Family History  Problem Relation Age of Onset  . Stroke Mother   . Neuropathy Brother   . Stroke Father   . Hypertension Neg Hx   . Diabetes Neg Hx   . Depression Neg Hx   . Alcohol abuse Neg Hx   . Drug abuse Neg Hx     Social History   Social History  . Marital status: Widowed    Spouse name: N/A  . Number of children: 2  . Years of education: HS    Occupational History  . Secretarial work---disabled    Social History Main Topics  . Smoking status: Never Smoker  . Smokeless tobacco: Never Used  . Alcohol use No  . Drug use: No  . Sexual activity: Not on file   Other Topics Concern  . Not on file   Social History Narrative   Widowed 1991.  Lives alone.    Played piano.    Non ambulatory as of 2011 due to BLE weakness-from injury after back surgery   Caffeine Use: rarely   Review of Systems Appetite is fine Weight is stable Sleeps okay---nocturia x 2    Objective:   Physical Exam  Constitutional: She appears well-nourished. No distress.  Cardiovascular: Normal rate, regular rhythm and normal heart sounds.  Exam reveals no gallop.   No murmur heard. Pulmonary/Chest: No respiratory distress. She has no wheezes. She has no rales.  Abdominal: Soft. There is no tenderness.  Musculoskeletal: She exhibits no edema.  Psychiatric: She has a normal mood and affect. Her behavior is normal.          Assessment & Plan:

## 2017-07-02 NOTE — Assessment & Plan Note (Signed)
She prefers no vaccines or cancer screening

## 2017-07-02 NOTE — Patient Instructions (Signed)
Please call with the name of the antibiotic you have done well with.

## 2017-07-03 ENCOUNTER — Telehealth: Payer: Self-pay

## 2017-07-03 LAB — COMPREHENSIVE METABOLIC PANEL
ALK PHOS: 76 U/L (ref 39–117)
ALT: 20 U/L (ref 0–35)
AST: 17 U/L (ref 0–37)
Albumin: 4.1 g/dL (ref 3.5–5.2)
BILIRUBIN TOTAL: 0.3 mg/dL (ref 0.2–1.2)
BUN: 18 mg/dL (ref 6–23)
CALCIUM: 9.7 mg/dL (ref 8.4–10.5)
CO2: 31 meq/L (ref 19–32)
Chloride: 103 mEq/L (ref 96–112)
Creatinine, Ser: 0.66 mg/dL (ref 0.40–1.20)
GFR: 93.77 mL/min (ref >60.00)
Glucose, Bld: 91 mg/dL (ref 70–99)
POTASSIUM: 4.9 meq/L (ref 3.5–5.1)
Sodium: 139 mEq/L (ref 135–145)
TOTAL PROTEIN: 7 g/dL (ref 6.0–8.3)

## 2017-07-03 LAB — CBC WITH DIFFERENTIAL/PLATELET
BASOS ABS: 0.1 10*3/uL (ref 0.0–0.1)
Basophils Relative: 1.3 % (ref 0.0–3.0)
EOS PCT: 1.2 % (ref 0.0–5.0)
Eosinophils Absolute: 0.1 10*3/uL (ref 0.0–0.7)
HEMATOCRIT: 41.3 % (ref 36.0–46.0)
Hemoglobin: 13.6 g/dL (ref 12.0–15.0)
LYMPHS PCT: 25.4 % (ref 12.0–46.0)
Lymphs Abs: 2.5 10*3/uL (ref 0.7–4.0)
MCHC: 32.9 g/dL (ref 30.0–36.0)
MCV: 94 fl (ref 78.0–100.0)
MONOS PCT: 7.7 % (ref 3.0–12.0)
Monocytes Absolute: 0.7 10*3/uL (ref 0.1–1.0)
Neutro Abs: 6.2 10*3/uL (ref 1.4–7.7)
Neutrophils Relative %: 64.4 % (ref 43.0–77.0)
Platelets: 409 10*3/uL — ABNORMAL HIGH (ref 150.0–400.0)
RBC: 4.39 Mil/uL (ref 3.87–5.11)
RDW: 12.6 % (ref 11.5–15.5)
WBC: 9.7 10*3/uL (ref 4.0–10.5)

## 2017-07-03 MED ORDER — CEPHALEXIN 500 MG PO CAPS: 500.0000 mg | ORAL_CAPSULE | Freq: Three times a day (TID) | ORAL | 1 refills | Status: AC

## 2017-07-03 NOTE — Telephone Encounter (Signed)
Pt was seen 07/02/17 and was to call back with abx did well with. Pt said she has done well with Keflex before. Pt wants to be sure med is sent to [facility].

## 2017-07-03 NOTE — Telephone Encounter (Signed)
Please send Rx for cephalexin 500mg   1 tid x 5 days for urinary infection #15 x 1

## 2017-07-03 NOTE — Telephone Encounter (Signed)
Spoke to pt. Rx sent in. 

## 2017-07-10 NOTE — Telephone Encounter (Signed)
Left pt message asking to call [name] back directly at [phone] to schedule AWV + labs with Virl Axe and CPE with PCP.  Note* Never had AWV before

## 2017-07-13 ENCOUNTER — Telehealth: Payer: Self-pay | Admitting: Internal Medicine

## 2017-07-13 NOTE — Telephone Encounter (Signed)
Pt is concerned with the large jump in the platelet count even though Dr Alphonsus Sias said he was not too concerned with it only being 9 points above normal. Pt would like an explanation on what could make it go up. Pt aware it will be tomorrow before I get back with her.

## 2017-07-13 NOTE — Telephone Encounter (Signed)
There is day to day variability with all lab results I consider a change of 40K in platelets not a big deal. Platelet count can change with acute illness or fever though (with day to day or even hour to hour changes)  It is REALLY not a concern to me--so it is disconcerting to me that she is spending all this time worrying about it

## 2017-07-13 NOTE — Telephone Encounter (Signed)
Patient received her lab results in the mail.  Patient said she has some questions about her lab results.  Please call patient back,

## 2017-07-14 NOTE — Telephone Encounter (Signed)
Spoke to pt. She said she will do her best not to worry.

## 2018-05-06 ENCOUNTER — Telehealth: Payer: Self-pay | Admitting: Internal Medicine

## 2018-05-06 NOTE — Telephone Encounter (Signed)
Copied from CRM [id number]. Topic: Quick Communication - See Telephone Encounter >> May 06, 2018 10:36 AM Cipriano Bunker wrote: CRM for notification. See Telephone encounter for: 05/06/18.  Pt called and is seeing Dr. Alphonsus Sias on 6/3 and will need lab work,  she wants to let you  methenamine (HIPREX) 1 g tablet And pharmacy told her to let dr know before her labs.

## 2018-05-06 NOTE — Telephone Encounter (Signed)
I could not find anything about it interfering with lab work---it would interfere with urine testing ----which I was not planning. She can always hold it that day just in case

## 2018-05-06 NOTE — Telephone Encounter (Signed)
Spoke to pt. She said she may not take it the day of her appt.

## 2018-05-06 NOTE — Telephone Encounter (Signed)
Spoke to [name]. The pharmacist advised her that this medication can interfere with certain lab results. She was told to remind Dr Alphonsus Sias she was on it and if anything needed to be done before her appt about the medication and her labs for that day.

## 2018-05-06 NOTE — Telephone Encounter (Signed)
I don't understand this message.  Please clarify.

## 2018-05-17 ENCOUNTER — Ambulatory Visit (INDEPENDENT_AMBULATORY_CARE_PROVIDER_SITE_OTHER): Payer: Medicare Other | Admitting: Internal Medicine

## 2018-05-17 ENCOUNTER — Encounter: Payer: Self-pay | Admitting: Internal Medicine

## 2018-05-17 VITALS — BP 118/66 | HR 58 | Temp 97.6°F

## 2018-05-17 DIAGNOSIS — Z7189 Other specified counseling: Secondary | ICD-10-CM | POA: Diagnosis not present

## 2018-05-17 DIAGNOSIS — Z0000 Encounter for general adult medical examination without abnormal findings: Secondary | ICD-10-CM | POA: Diagnosis not present

## 2018-05-17 DIAGNOSIS — G35 Multiple sclerosis: Secondary | ICD-10-CM

## 2018-05-17 DIAGNOSIS — G8220 Paraplegia, unspecified: Secondary | ICD-10-CM

## 2018-05-17 DIAGNOSIS — N390 Urinary tract infection, site not specified: Secondary | ICD-10-CM

## 2018-05-17 NOTE — Addendum Note (Signed)
Addended by: Tillman Abide I on: 05/17/2018 05:15 PM   Modules accepted: Orders

## 2018-05-17 NOTE — Progress Notes (Signed)
Subjective:    Patient ID: [name], female    DOB: September 28, 1946, 72 y.o.   MRN: [id number]  HPI Here for Medicare wellness and follow up of chronic health conditions With son Reviewed form and advanced directives Reviewed other doctors Vision and hearing are fine No alcohol or tobacco No depression or anhedonia Memory seems fine  Can stand to transfer--or moves over (or uses slide board) Moves in wheelchair Does her own bed baths She doesn't drive Son shops for her--- often mostly prepared (she uses microwave) Continent--uses bathroom herself  No pain issues  Mild problems that she is concerned about her urine No dysuria or hematuria Some back pain Occasional incontinence  Current Outpatient Medications on File Prior to Visit  Medication Sig Dispense Refill  . methenamine (HIPREX) 1 g tablet Take 1 g by mouth daily.     No current facility-administered medications on file prior to visit.     Allergies  Allergen Reactions  . Procaine Hcl     REACTION: UNSPECIFIED    Past Medical History:  Diagnosis Date  . Ankle fracture, left 07/2006  . Ankle fracture, right 10/30/06   [facility]  . Compression fracture 2011   Dr. Dion Saucier   . Disc herniation    L5, S1  . MS (multiple sclerosis) (HCC)    evaluated by Dr. Sandria Manly  . MVP (mitral valve prolapse) 1979   ECHO. Trace MR, trace TR, trace PR 07/17/03  . Nephrolithiasis   . Osteoporosis   . Rib fractures 1998, [id number]   2  . Toe fracture 1998   5    Past Surgical History:  Procedure Laterality Date  . LUMBAR DISC SURGERY  04/2007   L5, S1- [facility]  . SEPTOPLASTY  1983   deviated septum  . TONSILLECTOMY AND ADENOIDECTOMY  1960  . TOTAL ABDOMINAL HYSTERECTOMY W/ BILATERAL SALPINGOOPHORECTOMY  1982   for endometriosis    Family History  Problem Relation Age of Onset  . Stroke Mother   . Neuropathy Brother   . Stroke Father   . Hypertension Neg Hx   . Diabetes Neg Hx   . Depression Neg Hx   . Alcohol abuse  Neg Hx   . Drug abuse Neg Hx     Social History   Socioeconomic History  . Marital status: Widowed    Spouse name: Not on file  . Number of children: 2  . Years of education: HS  . Highest education level: Not on file  Occupational History  . Occupation: Secretarial work---disabled  Social Needs  . Financial resource strain: Not on file  . Food insecurity:    Worry: Not on file    Inability: Not on file  . Transportation needs:    Medical: Not on file    Non-medical: Not on file  Tobacco Use  . Smoking status: Never Smoker  . Smokeless tobacco: Never Used  Substance and Sexual Activity  . Alcohol use: No  . Drug use: No  . Sexual activity: Not on file  Lifestyle  . Physical activity:    Days per week: Not on file    Minutes per session: Not on file  . Stress: Not on file  Relationships  . Social connections:    Talks on phone: Not on file    Gets together: Not on file    Attends religious service: Not on file    Active member of club or organization: Not on file    Attends meetings of  clubs or organizations: Not on file    Relationship status: Not on file  . Intimate partner violence:    Fear of current or ex partner: Not on file    Emotionally abused: Not on file    Physically abused: Not on file    Forced sexual activity: Not on file  Other Topics Concern  . Not on file  Social History Narrative   Widowed 1991.  Lives alone.    Played piano.          No living will   Son [name] should make decisions for her prn   Would accept resuscitation attempts   No tube feeds if cognitively unaware   Review of Systems Keeps up with dentist Chronic moles---is considering getting dermatologist appt Appetite is fine Weight stable Sleeps well No heartburn or dysphagia No chest pain No SOB Occasional dizziness if she doesn't eat right. No syncope    Objective:   Physical Exam  Constitutional: She is oriented to person, place, and time. She appears  well-developed. No distress.  Wheelchair bound  HENT:  Mouth/Throat: Oropharynx is clear and moist. No oropharyngeal exudate.  Neck: No thyromegaly present.  Cardiovascular: Normal rate, regular rhythm, normal heart sounds and intact distal pulses. Exam reveals no gallop.  No murmur heard. Respiratory: Effort normal and breath sounds normal. No respiratory distress. She has no wheezes. She has no rales.  GI: Soft. There is no tenderness.  Musculoskeletal:  Trace edema in ankles  Lymphadenopathy:    She has no cervical adenopathy.  Neurological: She is alert and oriented to person, place, and time.  Refuses cognitive testing. Son has no concerns about her memory   Skin: No rash noted.  Psychiatric: She has a normal mood and affect. Her behavior is normal.           Assessment & Plan:

## 2018-05-17 NOTE — Progress Notes (Signed)
Hearing Screening   Method: Audiometry   125Hz  250Hz  500Hz  1000Hz  2000Hz  3000Hz  4000Hz  6000Hz  8000Hz   Right ear:   20 20 25   0    Left ear:   20 20 20   40      Visual Acuity Screening   Right eye Left eye Both eyes  Without correction: 20/25 20/25 20/25   With correction:

## 2018-05-17 NOTE — Assessment & Plan Note (Signed)
None recently Urinalysis was negative today

## 2018-05-17 NOTE — Assessment & Plan Note (Signed)
I have personally reviewed the Medicare Annual Wellness questionnaire and have noted 1. The patient's medical and social history 2. Their use of alcohol, tobacco or illicit drugs 3. Their current medications and supplements 4. The patient's functional ability including ADL's, fall risks, home safety risks and hearing or visual             impairment. 5. Diet and physical activities 6. Evidence for depression or mood disorders  The patients weight, height, BMI and visual acuity have been recorded in the chart I have made referrals, counseling and provided education to the patient based review of the above and I have provided the pt with a written personalized care plan for preventive services.  I have provided you with a copy of your personalized plan for preventive services. Please take the time to review along with your updated medication list.  No immunizations No cancer screening by her choice Discussed safety

## 2018-05-17 NOTE — Assessment & Plan Note (Signed)
See social history 

## 2018-05-17 NOTE — Assessment & Plan Note (Signed)
Is able to manage on her own with help Discussed considering hiring an aide occasionally for a good bath/shower Transfers on her own

## 2018-05-17 NOTE — Assessment & Plan Note (Signed)
Has been stable No Rx

## 2018-05-18 LAB — COMPREHENSIVE METABOLIC PANEL
ALBUMIN: 4 g/dL (ref 3.5–5.2)
ALK PHOS: 83 U/L (ref 39–117)
ALT: 44 U/L — AB (ref 0–35)
AST: 33 U/L (ref 0–37)
BILIRUBIN TOTAL: 0.3 mg/dL (ref 0.2–1.2)
BUN: 18 mg/dL (ref 6–23)
CO2: 29 meq/L (ref 19–32)
CREATININE: 0.61 mg/dL (ref 0.40–1.20)
Calcium: 9.7 mg/dL (ref 8.4–10.5)
Chloride: 104 mEq/L (ref 96–112)
GFR: 102.44 mL/min (ref >60.00)
Glucose, Bld: 94 mg/dL (ref 70–99)
Potassium: 4.9 mEq/L (ref 3.5–5.1)
Sodium: 140 mEq/L (ref 135–145)
TOTAL PROTEIN: 6.8 g/dL (ref 6.0–8.3)

## 2018-05-18 LAB — CBC
HCT: 39.6 % (ref 36.0–46.0)
Hemoglobin: 13.4 g/dL (ref 12.0–15.0)
MCHC: 33.9 g/dL (ref 30.0–36.0)
MCV: 92.3 fl (ref 78.0–100.0)
Platelets: 380 10*3/uL (ref 150.0–400.0)
RBC: 4.28 Mil/uL (ref 3.87–5.11)
RDW: 12.7 % (ref 11.5–15.5)
WBC: 7.9 10*3/uL (ref 4.0–10.5)

## 2018-05-19 ENCOUNTER — Telehealth: Payer: Self-pay | Admitting: Internal Medicine

## 2018-05-19 NOTE — Telephone Encounter (Signed)
We discussed it I forgot to write Rx ---I did now Find out where she wants it faxed

## 2018-05-19 NOTE — Telephone Encounter (Signed)
Pt seen 05/17/18.Please advise.

## 2018-05-19 NOTE — Telephone Encounter (Signed)
Spoke to pt. Rx up front for son, [name], to pickup. She will shop around.

## 2018-05-19 NOTE — Telephone Encounter (Signed)
Copied from CRM [id number]. Topic: Quick Communication - Rx Refill/Question >> May 19, 2018  1:31 PM Darletta Moll L wrote: Medication: Potty Chair  Has the patient contacted their pharmacy? No. (Agent: If no, request that the patient contact the pharmacy for the refill.) (Agent: If yes, when and what did the pharmacy advise?)  Preferred Pharmacy (with phone number or street name): n/a  Agent: Please be advised that RX refills may take up to 3 business days. We ask that you follow-up with your pharmacy.  Patient discussed with Dr. Alphonsus Sias in office on 06/03 about getting an order for a "portable potty chair". She is not very mobile at home.

## 2018-07-19 ENCOUNTER — Telehealth: Payer: Self-pay | Admitting: Internal Medicine

## 2018-07-19 NOTE — Telephone Encounter (Signed)
I spoke with [name]; [name] having frequency of urine with low back pain in middle; no fever and no burning upon urination. [name] did not want to schedule appt. [name] will take urine specimen to urologist. [name] changed her mind and has a dental appt today and cannot be seen today. [name] needs appt after 4 PM on 07/20/18 due to transportation. [name] scheduled appt on 07/20/18 at 4:15 with R Baity NP. If [name] condition changes or worsens prior to appt [name] will either go to UC or urologist.

## 2018-07-19 NOTE — Telephone Encounter (Signed)
Copied from CRM [id number]. Topic: Quick Communication - See Telephone Encounter >> Jul 19, 2018  8:12 AM Burchel, Abbi R wrote: CRM for notification. See Telephone encounter for: 07/19/18.  Pt requesting to bring in u/a for testing.  Pt suspects she may have a UTI.  Please call pt to advise.   Pt: [phone]

## 2018-07-20 ENCOUNTER — Encounter: Payer: Self-pay | Admitting: Internal Medicine

## 2018-07-20 ENCOUNTER — Ambulatory Visit (INDEPENDENT_AMBULATORY_CARE_PROVIDER_SITE_OTHER): Payer: Medicare Other | Admitting: Internal Medicine

## 2018-07-20 VITALS — BP 118/74 | HR 60 | Temp 98.2°F

## 2018-07-20 DIAGNOSIS — R350 Frequency of micturition: Secondary | ICD-10-CM | POA: Diagnosis not present

## 2018-07-20 DIAGNOSIS — G8929 Other chronic pain: Secondary | ICD-10-CM

## 2018-07-20 DIAGNOSIS — M545 Low back pain: Secondary | ICD-10-CM | POA: Diagnosis not present

## 2018-07-20 LAB — POC URINALSYSI DIPSTICK (AUTOMATED)
Bilirubin, UA: NEGATIVE
Blood, UA: NEGATIVE
GLUCOSE UA: NEGATIVE
KETONES UA: NEGATIVE
Leukocytes, UA: NEGATIVE
Nitrite, UA: NEGATIVE
PROTEIN UA: NEGATIVE
Urobilinogen, UA: 0.2 E.U./dL
pH, UA: 6 (ref 5.0–8.0)

## 2018-07-21 ENCOUNTER — Encounter: Payer: Self-pay | Admitting: Internal Medicine

## 2018-07-21 LAB — URINE CULTURE
MICRO NUMBER:: 90928846
SPECIMEN QUALITY:: ADEQUATE

## 2018-07-21 NOTE — Patient Instructions (Signed)

## 2018-07-21 NOTE — Progress Notes (Signed)
HPI  Pt presents to the clinic today with c/o urinary frequency and low back pain. She reports this started 1-2 weeks ago, but seems to be improving. She does have some incontinence but denies urgency, dysuria or blood in her urine. She denies vaginal complaints. She reports chronic low back pain which is not any worse. She has increased her water intake but she has not tried anything OTC.   Review of Systems  Past Medical History:  Diagnosis Date  . Ankle fracture, left 07/2006  . Ankle fracture, right 10/30/06   [facility]  . Compression fracture 2011   Dr. Dion Saucier   . Disc herniation    L5, S1  . MS (multiple sclerosis) (HCC)    evaluated by Dr. Sandria Manly  . MVP (mitral valve prolapse) 1979   ECHO. Trace MR, trace TR, trace PR 07/17/03  . Nephrolithiasis   . Osteoporosis   . Rib fractures 1998, [id number]   2  . Toe fracture 1998   5    Family History  Problem Relation Age of Onset  . Stroke Mother   . Neuropathy Brother   . Stroke Father   . Hypertension Neg Hx   . Diabetes Neg Hx   . Depression Neg Hx   . Alcohol abuse Neg Hx   . Drug abuse Neg Hx     Social History   Socioeconomic History  . Marital status: Widowed    Spouse name: Not on file  . Number of children: 2  . Years of education: HS  . Highest education level: Not on file  Occupational History  . Occupation: Secretarial work---disabled  Social Needs  . Financial resource strain: Not on file  . Food insecurity:    Worry: Not on file    Inability: Not on file  . Transportation needs:    Medical: Not on file    Non-medical: Not on file  Tobacco Use  . Smoking status: Never Smoker  . Smokeless tobacco: Never Used  Substance and Sexual Activity  . Alcohol use: No  . Drug use: No  . Sexual activity: Not on file  Lifestyle  . Physical activity:    Days per week: Not on file    Minutes per session: Not on file  . Stress: Not on file  Relationships  . Social connections:    Talks on phone: Not on file   Gets together: Not on file    Attends religious service: Not on file    Active member of club or organization: Not on file    Attends meetings of clubs or organizations: Not on file    Relationship status: Not on file  . Intimate partner violence:    Fear of current or ex partner: Not on file    Emotionally abused: Not on file    Physically abused: Not on file    Forced sexual activity: Not on file  Other Topics Concern  . Not on file  Social History Narrative   Widowed 1991.  Lives alone.    Played piano.          No living will   Son [name] should make decisions for her prn   Would accept resuscitation attempts   No tube feeds if cognitively unaware    Allergies  Allergen Reactions  . Procaine Hcl     REACTION: UNSPECIFIED     Constitutional: Denies fever, malaise, fatigue, headache or abrupt weight changes.   GU: Pt reports frequency and low back pain.  Denies urgency, burning sensation, blood in urine, odor or discharge.   No other specific complaints in a complete review of systems (except as listed in HPI above).    Objective:   Physical Exam  BP 118/74   Pulse 60   Temp 98.2 F (36.8 C) (Oral)   SpO2 98%  Wt Readings from Last 3 Encounters:  07/02/17 113 lb (51.3 kg)  05/04/98 125 lb (56.7 kg)    General: Appears her stated age, well developed, well nourished in NAD. Abdomen: Soft. Normal bowel sounds. No distention or masses noted.  Tender to palpation over the bladder area. No CVA tenderness.        Assessment & Plan:  Frequency, Low Back Pain:  Urinalysis: normal Will send urine culture No abx unless culture positive OK to take AZO OTC Drink plenty of fluids  RTC as needed or if symptoms persist. Nicki Reaper, NP

## 2018-12-16 ENCOUNTER — Telehealth: Payer: Self-pay

## 2018-12-16 NOTE — Telephone Encounter (Signed)
Pt requesting letter with diagnosis or disability to be excused from jury duty on 02/08/19. Pts juror # is [id number]. Pt wants to know if can have a permanent excuse from jury duty.  Pt request letter mailed to her verified home address.

## 2018-12-17 ENCOUNTER — Encounter: Payer: Self-pay | Admitting: Internal Medicine

## 2018-12-17 DIAGNOSIS — Z0279 Encounter for issue of other medical certificate: Secondary | ICD-10-CM

## 2018-12-17 NOTE — Telephone Encounter (Signed)
I spoke to patient and let her know form is ready and $20 charge.  Letter mailed to patient.

## 2018-12-17 NOTE — Telephone Encounter (Signed)
Letter done $20 charge 

## 2018-12-21 NOTE — Telephone Encounter (Signed)
error 

## 2019-10-03 ENCOUNTER — Telehealth: Payer: Self-pay | Admitting: Internal Medicine

## 2019-10-03 NOTE — Telephone Encounter (Signed)
Spoke to pt. She says her left hand is swollen at the thumb joint. There seems to be a knot on that same side just at the wrist. Also, some pain and swelling from the elbow to the shoulder. Some bruising in that area. No fall or injury. This has been going on for about 2 weeks.   Decided to do a virtual visit on 10-22.

## 2019-10-03 NOTE — Telephone Encounter (Signed)
Patient is asking for a call back from you. She would not give much details other than it is in regards to her arm and hand pain. And she really wanted to speak to you.

## 2019-10-03 NOTE — Telephone Encounter (Signed)
Please try to find out what is going on---I don't have much time today (at least until the end of the day)

## 2019-10-06 ENCOUNTER — Ambulatory Visit (INDEPENDENT_AMBULATORY_CARE_PROVIDER_SITE_OTHER): Payer: Medicare HMO | Admitting: Internal Medicine

## 2019-10-06 ENCOUNTER — Encounter: Payer: Self-pay | Admitting: Internal Medicine

## 2019-10-06 DIAGNOSIS — G35 Multiple sclerosis: Secondary | ICD-10-CM

## 2019-10-06 DIAGNOSIS — G8220 Paraplegia, unspecified: Secondary | ICD-10-CM | POA: Diagnosis not present

## 2019-10-06 DIAGNOSIS — Z0000 Encounter for general adult medical examination without abnormal findings: Secondary | ICD-10-CM

## 2019-10-06 DIAGNOSIS — M25532 Pain in left wrist: Secondary | ICD-10-CM | POA: Diagnosis not present

## 2019-10-06 NOTE — Assessment & Plan Note (Signed)
No active changes in condition

## 2019-10-06 NOTE — Assessment & Plan Note (Signed)
Continues to function largely on her own with some assistance

## 2019-10-06 NOTE — Assessment & Plan Note (Signed)
Appears to have some mild tendonitis Likely from pushing herself around in the wheelchair She wants to try hemp oil that son can get---okay to try Ice when painful Suggested padded gloves for when she is moving her wheelchair Discussed it should be self limited Nothing to suggest bony damage

## 2019-10-06 NOTE — Assessment & Plan Note (Signed)
Prefers to defer AMW for now due to COVID

## 2019-10-06 NOTE — Progress Notes (Signed)
Subjective:    Patient ID: [name], female    DOB: 1946/07/15, 73 y.o.   MRN: [id number]  HPI  Virtual visit due to arm and hand pain Identification done Reviewed billing and she gave consent She is in her home and I am in my office Son is there to facilitate the call  Has pain around left thumb and up forearm a little Started 2-3 weeks ago Had swollen some but this is better No known injury---but she does bump into things at times Same wheelchair she self propels Still transfers herself Slight decreased sensation over thumb. Slight weakness  Tried hemp oil No heat or ice No analgesics Not limiting her  Also with swelling in extensor left arm and a bruise below that  Current Outpatient Medications on File Prior to Visit  Medication Sig Dispense Refill  . methenamine (HIPREX) 1 g tablet Take 1 g by mouth daily.     No current facility-administered medications on file prior to visit.     Allergies  Allergen Reactions  . Procaine Hcl     REACTION: UNSPECIFIED    Past Medical History:  Diagnosis Date  . Ankle fracture, left 07/2006  . Ankle fracture, right 10/30/06   [facility]  . Compression fracture 2011   Dr. Dion Saucier   . Disc herniation    L5, S1  . MS (multiple sclerosis) (HCC)    evaluated by Dr. Sandria Manly  . MVP (mitral valve prolapse) 1979   ECHO. Trace MR, trace TR, trace PR 07/17/03  . Nephrolithiasis   . Osteoporosis   . Rib fractures 1998, [id number]   2  . Toe fracture 1998   5    Past Surgical History:  Procedure Laterality Date  . LUMBAR DISC SURGERY  04/2007   L5, S1- [facility]  . SEPTOPLASTY  1983   deviated septum  . TONSILLECTOMY AND ADENOIDECTOMY  1960  . TOTAL ABDOMINAL HYSTERECTOMY W/ BILATERAL SALPINGOOPHORECTOMY  1982   for endometriosis    Family History  Problem Relation Age of Onset  . Stroke Mother   . Neuropathy Brother   . Stroke Father   . Hypertension Neg Hx   . Diabetes Neg Hx   . Depression Neg Hx   . Alcohol abuse Neg Hx    . Drug abuse Neg Hx     Social History   Socioeconomic History  . Marital status: Widowed    Spouse name: Not on file  . Number of children: 2  . Years of education: HS  . Highest education level: Not on file  Occupational History  . Occupation: Secretarial work---disabled  Social Needs  . Financial resource strain: Not on file  . Food insecurity    Worry: Not on file    Inability: Not on file  . Transportation needs    Medical: Not on file    Non-medical: Not on file  Tobacco Use  . Smoking status: Never Smoker  . Smokeless tobacco: Never Used  Substance and Sexual Activity  . Alcohol use: No  . Drug use: No  . Sexual activity: Not on file  Lifestyle  . Physical activity    Days per week: Not on file    Minutes per session: Not on file  . Stress: Not on file  Relationships  . Social Musician on phone: Not on file    Gets together: Not on file    Attends religious service: Not on file    Active member  of club or organization: Not on file    Attends meetings of clubs or organizations: Not on file    Relationship status: Not on file  . Intimate partner violence    Fear of current or ex partner: Not on file    Emotionally abused: Not on file    Physically abused: Not on file    Forced sexual activity: Not on file  Other Topics Concern  . Not on file  Social History Narrative   Widowed 1991.  Lives alone.    Played piano.          No living will   Son Shanon Brow should make decisions for her prn   Would accept resuscitation attempts   No tube feeds if cognitively unaware   Review of Systems  Hasn't been sick  No fever Did have some daily dizziness since about a month ago---better now     Objective:   Physical Exam  Constitutional: No distress.  Musculoskeletal:     Comments: No left hand or wrist swelling Slight tenderness along the radial side of left wrist Slight nodule Normal ROM in hand and fingers  Neurological:  At most only slight  decreased strength in thumb Hand strength seems fairly normal           Assessment & Plan:

## 2019-11-26 ENCOUNTER — Other Ambulatory Visit: Payer: Self-pay

## 2019-11-26 ENCOUNTER — Emergency Department (HOSPITAL_COMMUNITY): Payer: Medicare HMO

## 2019-11-26 ENCOUNTER — Encounter (HOSPITAL_COMMUNITY): Payer: Self-pay | Admitting: Emergency Medicine

## 2019-11-26 ENCOUNTER — Inpatient Hospital Stay (HOSPITAL_COMMUNITY)
Admission: EM | Admit: 2019-11-26 | Discharge: 2019-12-02 | DRG: 060 | Disposition: A | Discharge status: Rehabilitation Facility | Payer: Medicare HMO | Attending: Family Medicine | Admitting: Family Medicine

## 2019-11-26 DIAGNOSIS — H547 Unspecified visual loss: Secondary | ICD-10-CM | POA: Diagnosis not present

## 2019-11-26 DIAGNOSIS — R001 Bradycardia, unspecified: Secondary | ICD-10-CM | POA: Diagnosis not present

## 2019-11-26 DIAGNOSIS — Z888 Allergy status to other drugs, medicaments and biological substances status: Secondary | ICD-10-CM

## 2019-11-26 DIAGNOSIS — Z9079 Acquired absence of other genital organ(s): Secondary | ICD-10-CM

## 2019-11-26 DIAGNOSIS — Z87440 Personal history of urinary (tract) infections: Secondary | ICD-10-CM

## 2019-11-26 DIAGNOSIS — R531 Weakness: Secondary | ICD-10-CM | POA: Diagnosis not present

## 2019-11-26 DIAGNOSIS — R42 Dizziness and giddiness: Secondary | ICD-10-CM | POA: Diagnosis not present

## 2019-11-26 DIAGNOSIS — L89151 Pressure ulcer of sacral region, stage 1: Secondary | ICD-10-CM | POA: Diagnosis not present

## 2019-11-26 DIAGNOSIS — Z90710 Acquired absence of both cervix and uterus: Secondary | ICD-10-CM | POA: Diagnosis not present

## 2019-11-26 DIAGNOSIS — Z20828 Contact with and (suspected) exposure to other viral communicable diseases: Secondary | ICD-10-CM | POA: Diagnosis not present

## 2019-11-26 DIAGNOSIS — E785 Hyperlipidemia, unspecified: Secondary | ICD-10-CM | POA: Diagnosis present

## 2019-11-26 DIAGNOSIS — G8220 Paraplegia, unspecified: Secondary | ICD-10-CM | POA: Diagnosis present

## 2019-11-26 DIAGNOSIS — R110 Nausea: Secondary | ICD-10-CM | POA: Diagnosis not present

## 2019-11-26 DIAGNOSIS — G35 Multiple sclerosis: Secondary | ICD-10-CM

## 2019-11-26 DIAGNOSIS — D649 Anemia, unspecified: Secondary | ICD-10-CM | POA: Diagnosis not present

## 2019-11-26 DIAGNOSIS — L8990 Pressure ulcer of unspecified site, unspecified stage: Secondary | ICD-10-CM | POA: Insufficient documentation

## 2019-11-26 DIAGNOSIS — Z90722 Acquired absence of ovaries, bilateral: Secondary | ICD-10-CM | POA: Diagnosis not present

## 2019-11-26 DIAGNOSIS — I69398 Other sequelae of cerebral infarction: Secondary | ICD-10-CM

## 2019-11-26 DIAGNOSIS — M810 Age-related osteoporosis without current pathological fracture: Secondary | ICD-10-CM | POA: Diagnosis not present

## 2019-11-26 DIAGNOSIS — K592 Neurogenic bowel, not elsewhere classified: Secondary | ICD-10-CM | POA: Diagnosis not present

## 2019-11-26 DIAGNOSIS — R0989 Other specified symptoms and signs involving the circulatory and respiratory systems: Secondary | ICD-10-CM | POA: Diagnosis not present

## 2019-11-26 DIAGNOSIS — R278 Other lack of coordination: Secondary | ICD-10-CM | POA: Diagnosis not present

## 2019-11-26 DIAGNOSIS — D638 Anemia in other chronic diseases classified elsewhere: Secondary | ICD-10-CM | POA: Diagnosis not present

## 2019-11-26 DIAGNOSIS — R030 Elevated blood-pressure reading, without diagnosis of hypertension: Secondary | ICD-10-CM | POA: Diagnosis not present

## 2019-11-26 DIAGNOSIS — Z79899 Other long term (current) drug therapy: Secondary | ICD-10-CM | POA: Diagnosis not present

## 2019-11-26 DIAGNOSIS — D62 Acute posthemorrhagic anemia: Secondary | ICD-10-CM | POA: Diagnosis not present

## 2019-11-26 DIAGNOSIS — I9589 Other hypotension: Secondary | ICD-10-CM | POA: Diagnosis not present

## 2019-11-26 DIAGNOSIS — G35D Multiple sclerosis, unspecified: Secondary | ICD-10-CM | POA: Diagnosis present

## 2019-11-26 DIAGNOSIS — I341 Nonrheumatic mitral (valve) prolapse: Secondary | ICD-10-CM | POA: Diagnosis present

## 2019-11-26 DIAGNOSIS — Z993 Dependence on wheelchair: Secondary | ICD-10-CM | POA: Diagnosis not present

## 2019-11-26 DIAGNOSIS — N319 Neuromuscular dysfunction of bladder, unspecified: Secondary | ICD-10-CM

## 2019-11-26 DIAGNOSIS — R29818 Other symptoms and signs involving the nervous system: Secondary | ICD-10-CM | POA: Diagnosis not present

## 2019-11-26 DIAGNOSIS — R29810 Facial weakness: Secondary | ICD-10-CM | POA: Diagnosis not present

## 2019-11-26 LAB — COMPREHENSIVE METABOLIC PANEL
ALT: 18 U/L (ref 0–44)
AST: 20 U/L (ref 15–41)
Albumin: 3.5 g/dL (ref 3.5–5.0)
Alkaline Phosphatase: 64 U/L (ref 38–126)
Anion gap: 9 (ref 5–15)
BUN: 13 mg/dL (ref 8–23)
CO2: 23 mmol/L (ref 22–32)
Calcium: 8.4 mg/dL — ABNORMAL LOW (ref 8.9–10.3)
Chloride: 107 mmol/L (ref 98–111)
Creatinine, Ser: 0.6 mg/dL (ref 0.44–1.00)
GFR calc Af Amer: >60 mL/min (ref >60)
GFR calc non Af Amer: >60 mL/min (ref >60)
Glucose, Bld: 126 mg/dL — ABNORMAL HIGH (ref 70–99)
Potassium: 3.9 mmol/L (ref 3.5–5.1)
Sodium: 139 mmol/L (ref 135–145)
Total Bilirubin: 0.8 mg/dL (ref 0.3–1.2)
Total Protein: 6.1 g/dL — ABNORMAL LOW (ref 6.5–8.1)

## 2019-11-26 LAB — CBC WITH DIFFERENTIAL/PLATELET
Abs Immature Granulocytes: 0.07 10*3/uL (ref 0.00–0.07)
Basophils Absolute: 0 10*3/uL (ref 0.0–0.1)
Basophils Relative: 0 %
Eosinophils Absolute: 0 10*3/uL (ref 0.0–0.5)
Eosinophils Relative: 0 %
HCT: 40.2 % (ref 36.0–46.0)
Hemoglobin: 13.1 g/dL (ref 12.0–15.0)
Immature Granulocytes: 1 %
Lymphocytes Relative: 5 %
Lymphs Abs: 0.8 10*3/uL (ref 0.7–4.0)
MCH: 31 pg (ref 26.0–34.0)
MCHC: 32.6 g/dL (ref 30.0–36.0)
MCV: 95.3 fL (ref 80.0–100.0)
Monocytes Absolute: 0.3 10*3/uL (ref 0.1–1.0)
Monocytes Relative: 2 %
Neutro Abs: 13.2 10*3/uL — ABNORMAL HIGH (ref 1.7–7.7)
Neutrophils Relative %: 92 %
Platelets: 344 10*3/uL (ref 150–400)
RBC: 4.22 MIL/uL (ref 3.87–5.11)
RDW: 11.6 % (ref 11.5–15.5)
WBC: 14.3 10*3/uL — ABNORMAL HIGH (ref 4.0–10.5)
nRBC: 0 % (ref 0.0–0.2)

## 2019-11-26 LAB — URINALYSIS, ROUTINE W REFLEX MICROSCOPIC
Bilirubin Urine: NEGATIVE
Glucose, UA: 50 mg/dL — AB
Hgb urine dipstick: NEGATIVE
Ketones, ur: 20 mg/dL — AB
Leukocytes,Ua: NEGATIVE
Nitrite: NEGATIVE
Protein, ur: NEGATIVE mg/dL
Specific Gravity, Urine: 1.01 (ref 1.005–1.030)
pH: 7 (ref 5.0–8.0)

## 2019-11-26 LAB — SARS CORONAVIRUS 2 (TAT 6-24 HRS): SARS Coronavirus 2: NEGATIVE

## 2019-11-26 LAB — TROPONIN I (HIGH SENSITIVITY)
Troponin I (High Sensitivity): <2 ng/L (ref <18)
Troponin I (High Sensitivity): <2 ng/L (ref <18)

## 2019-11-26 MED ORDER — PROMETHAZINE HCL 25 MG PO TABS: 25.0000 mg | ORAL_TABLET | Freq: Four times a day (QID) | ORAL | 0 refills | Status: DC | PRN

## 2019-11-26 MED ORDER — MECLIZINE HCL 25 MG PO TABS
25.0000 mg | ORAL_TABLET | Freq: Once | ORAL | Status: AC
  Administered 2019-11-26: 25 mg via ORAL
  Filled 2019-11-26: qty 1

## 2019-11-26 MED ORDER — PROMETHAZINE HCL 25 MG/ML IJ SOLN
12.5000 mg | Freq: Once | INTRAMUSCULAR | Status: AC
  Administered 2019-11-26: 12.5 mg via INTRAVENOUS
  Filled 2019-11-26: qty 1

## 2019-11-26 MED ORDER — SODIUM CHLORIDE 0.9 % IV BOLUS
1000.0000 mL | Freq: Once | INTRAVENOUS | Status: AC
  Administered 2019-11-26: 1000 mL via INTRAVENOUS

## 2019-11-26 MED ORDER — MECLIZINE HCL 25 MG PO TABS: 25.0000 mg | ORAL_TABLET | Freq: Three times a day (TID) | ORAL | 0 refills | Status: DC | PRN

## 2019-11-26 MED ORDER — LORAZEPAM 2 MG/ML IJ SOLN
1.0000 mg | Freq: Once | INTRAMUSCULAR | Status: AC
  Administered 2019-11-26: 1 mg via INTRAVENOUS
  Filled 2019-11-26: qty 1

## 2019-11-26 NOTE — ED Notes (Signed)
Pt returned from MRI °

## 2019-11-26 NOTE — ED Notes (Signed)
Notified EDP that son had noticed pulse variations from 30's -90's and his concern for her weakness. EDP will visit room.

## 2019-11-26 NOTE — ED Notes (Signed)
Patient transported to MRI by this RN  

## 2019-11-26 NOTE — ED Provider Notes (Addendum)
3:57 PM Care assumed from Dr. Gilford Raid.  At time of transfer of care, patient is awaiting results of MRI to rule out stroke as cause of dizziness.  Patient reportedly was feeling better after Antivert however if MRI shows stroke, she will likely admission.  If MRI is reassuring, dissipate discharge home per previous plan.  Patient's MRI did not show acute stroke.  There was evidence of MS but no prior to compare and no convincing MS flare at this time.  Patient's dizziness did change with positions, suspect peripheral vertigo based on imaging results.  Still had some dizziness and requested further fluids.  She was given a liter of fluids and will be p.o. challenge.  After fluids, she was feeling better and had improvement in dizziness.  Patient will be given prescription for meclizine and follow-up with ENT as well as her neurologist.  We discussed that MRI without is not as good at seeing an acute MS flare however we ruled out stroke today.  If symptoms not improve in the next few days, she was recommended to come back to the emergency department for likely MRI with contrast to look for MS flare.  Patient and family are agreeable this plan and she is feeling better.     10:42 PM Patient initially was going to be discharged but then when she tried to transition to the wheelchair, she started having profound dizziness, nausea, and vomiting.  She reports that she still feels terrible and does not feel better than when she got here.  I consulted neurology who will come see her.  He recommended MRI with contrast to rule out acute MS flare as the MRI previously was more focused on ruling out stroke.  Anticipate following up on MRI results and neurology recommendations for further disposition.  11:48 PM Care transferred to Dr. Jeris Penta while awaiting results of MRI and neurology recommendations.  Anticipate admission if her dizziness does not improve and she is still unable to stop vomiting.     [name], Gwenyth Allegra, MD 11/26/19 [phone]

## 2019-11-26 NOTE — ED Notes (Signed)
Called to follow up on MRI. This pt is next.

## 2019-11-26 NOTE — ED Triage Notes (Signed)
Pt in from home via [facility] with c/o dizziness and weakness x 3 wks. States this am at 0700, it became worse. When EMS arrived, pt pale and nauseous. Given 1LNS en route and 4mg  Zofran. BP 140/70 and CBG 143. Hx of MS, pt is wheelchair bound

## 2019-11-26 NOTE — Discharge Instructions (Signed)
Your work-up today was overall reassuring with no evidence of acute stroke on the MRI.  You do have MS on the imaging, please follow-up with your neurologist for this.  We suspect your dizziness is due to vertigo given the lack of stroke on imaging and your improvement with fluids and meclizine and nausea medicine.  Please follow-up with ENT for this.  Please rest and stay hydrated.  If any symptoms change or worsen or do not get better over the next several days, please consider return to the emergency department for further evaluation.

## 2019-11-26 NOTE — ED Provider Notes (Addendum)
[facility]   CSN: [id number] Arrival date & time: 11/26/19  1035     History Chief Complaint  Patient presents with  . Dizziness  . Weakness    [name] is a 73 y.o. female.  Pt presents to the ED today with dizziness and nausea.  The pt said the dizziness has been going on for a few weeks.  The pt has not taken anything for it.  She is wheelchair bound due to MS.  She denies any vomiting, but feels nauseous.  EMS gave her 4 mg of zofran which did not help.  No sob.  No cp.  No fevers.        Past Medical History:  Diagnosis Date  . Ankle fracture, left 07/2006  . Ankle fracture, right 10/30/06   [facility]  . Compression fracture 2011   Dr. Dion SaucierLandau   . Disc herniation    L5, S1  . MS (multiple sclerosis) (HCC)    evaluated by Dr. Sandria ManlyLove  . MVP (mitral valve prolapse) 1979   ECHO. Trace MR, trace TR, trace PR 07/17/03  . Nephrolithiasis   . Osteoporosis   . Rib fractures 1998, [id number]   2  . Toe fracture 1998   5    Patient Active Problem List   Diagnosis Date Noted  . Acute pain of left wrist 10/06/2019  . Advance directive discussed with patient 05/17/2018  . Paraplegia, unspecified (HCC) 07/02/2017  . Recurrent UTI 07/02/2017  . Cervical spinal stenosis 06/12/2016  . 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 Health care maintenance 10/18/2012  . Osteoporosis   . Nephrolithiasis   . HYPERLIPIDEMIA 05/25/2008  . Multiple sclerosis (HCC) 05/24/2008  . MITRAL VALVE PROLAPSE, HX OF 05/24/2008    Past Surgical History:  Procedure Laterality Date  . LUMBAR DISC SURGERY  04/2007   L5, S1- [facility]  . SEPTOPLASTY  1983   deviated septum  . TONSILLECTOMY AND ADENOIDECTOMY  1960  . TOTAL ABDOMINAL HYSTERECTOMY W/ BILATERAL SALPINGOOPHORECTOMY  1982   for endometriosis     OB History   No obstetric history on file.     Family History  Problem Relation Age of Onset  . Stroke Mother   . Neuropathy  Brother   . Stroke Father   . Hypertension Neg Hx   . Diabetes Neg Hx   . Depression Neg Hx   . Alcohol abuse Neg Hx   . Drug abuse Neg Hx     Social History   Tobacco Use  . Smoking status: Never Smoker  . Smokeless tobacco: Never Used  Substance Use Topics  . Alcohol use: No  . Drug use: No    Home Medications Prior to Admission medications   Medication Sig Start Date End Date Taking? Authorizing Provider  methenamine (HIPREX) 1 g tablet Take 1 g by mouth daily.    [provider]    Allergies    Procaine hcl  Review of Systems   Review of Systems  Gastrointestinal: Positive for nausea.  Neurological: Positive for dizziness.  All other systems reviewed and are negative.   Physical Exam Updated Vital Signs BP 126/70   Pulse 76   Temp 98.7 F (37.1 C)   Resp 15   Wt 51.3 kg   SpO2 100%   BMI 20.03 kg/m   Physical Exam Vitals and nursing note reviewed.  Constitutional:      Appearance: Normal appearance.  HENT:  Head: Normocephalic and atraumatic.     Right Ear: External ear normal.     Left Ear: External ear normal.     Nose: Nose normal.     Mouth/Throat:     Mouth: Mucous membranes are moist.     Pharynx: Oropharynx is clear.  Eyes:     Extraocular Movements: Extraocular movements intact.     Conjunctiva/sclera: Conjunctivae normal.     Pupils: Pupils are equal, round, and reactive to light.  Cardiovascular:     Rate and Rhythm: Normal rate and regular rhythm.     Pulses: Normal pulses.     Heart sounds: Normal heart sounds.  Pulmonary:     Effort: Pulmonary effort is normal.     Breath sounds: Normal breath sounds.  Abdominal:     General: Abdomen is flat. Bowel sounds are normal.     Palpations: Abdomen is soft.  Musculoskeletal:        General: Normal range of motion.     Cervical back: Normal range of motion and neck supple.  Skin:    General: Skin is warm.     Capillary Refill: Capillary refill takes less than 2 seconds.   Neurological:     Mental Status: She is alert and oriented to person, place, and time.     Comments: Chronic weakness bilateral LE  Psychiatric:        Mood and Affect: Mood normal.        Behavior: Behavior normal.     ED Results / Procedures / Treatments   Labs (all labs ordered are listed, but only abnormal results are displayed) Labs Reviewed  COMPREHENSIVE METABOLIC PANEL - Abnormal; Notable for the following components:      Result Value   Glucose, Bld 126 (*)    Calcium 8.4 (*)    Total Protein 6.1 (*)    All other components within normal limits  CBC WITH DIFFERENTIAL/PLATELET - Abnormal; Notable for the following components:   WBC 14.3 (*)    Neutro Abs 13.2 (*)    All other components within normal limits  URINALYSIS, ROUTINE W REFLEX MICROSCOPIC - Abnormal; Notable for the following components:   Color, Urine STRAW (*)    Glucose, UA 50 (*)    Ketones, ur 20 (*)    All other components within normal limits  SARS CORONAVIRUS 2 (TAT 6-24 HRS)  TROPONIN I (HIGH SENSITIVITY)  TROPONIN I (HIGH SENSITIVITY)    EKG EKG Interpretation  Date/Time:  Saturday November 26 2019 10:37:39 EST Ventricular Rate:  61 PR Interval:    QRS Duration: 99 QT Interval:  471 QTC Calculation: 475 R Axis:   77 Text Interpretation: Sinus rhythm No significant change since last tracing Confirmed by Jacalyn Lefevre [phone]) on 11/26/2019 3:40:14 PM   Radiology CT Head Wo Contrast  Result Date: 11/26/2019 CLINICAL DATA:  Dizziness and weakness for 3 weeks EXAM: CT HEAD WITHOUT CONTRAST TECHNIQUE: Contiguous axial images were obtained from the base of the skull through the vertex without intravenous contrast. COMPARISON:  None. FINDINGS: Brain: Brain atrophy noted with white matter microvascular ischemic changes about the lateral ventricle throughout both cerebral hemispheres. No acute intracranial hemorrhage definite new infarction mass lesion, midline shift, herniation, hydrocephalus,  extra-axial fluid collection. No focal mass effect or edema. Cisterns are patent. No cerebellar abnormality. Vascular: Intracranial atherosclerosis noted.  No hyperdense vessel. Skull: Normal. Negative for fracture or focal lesion. Sinuses/Orbits: No acute finding. Other: None. IMPRESSION: Brain atrophy and chronic white matter microvascular  changes. No acute intracranial abnormality by noncontrast CT. Electronically Signed   By: Jerilynn Mages.  Shick M.D.   On: 11/26/2019 11:37   DG Chest Portable 1 View  Result Date: 11/26/2019 CLINICAL DATA:  Pt in from home via [facility] with c/o dizziness and weakness x 3 wks. EXAM: PORTABLE CHEST 1 VIEW COMPARISON:  Chest radiograph 04/23/2007 FINDINGS: The heart size and mediastinal contours are within normal limits. Aortic arch calcification. The lungs are clear bilaterally. No pneumothorax or pleural effusion. No acute finding in the visualized skeleton. IMPRESSION: 1. No acute cardiopulmonary process. 2. Aortic atherosclerosis. Electronically Signed   By: Audie Pinto M.D.   On: 11/26/2019 11:38    Procedures Procedures (including critical care time)  Medications Ordered in ED Medications  LORazepam (ATIVAN) injection 1 mg (has no administration in time range)  sodium chloride 0.9 % bolus 1,000 mL (0 mLs Intravenous Stopped 11/26/19 1345)  meclizine (ANTIVERT) tablet 25 mg (25 mg Oral Given 11/26/19 1154)  promethazine (PHENERGAN) injection 12.5 mg (12.5 mg Intravenous Given 11/26/19 1151)    ED Course  I have reviewed the triage vital signs and the nursing notes.  Pertinent labs & imaging results that were available during my care of the patient were reviewed by me and considered in my medical decision making (see chart for details).    MDM Rules/Calculators/A&P     CHA2DS2/VAS Stroke Risk Points      N/A >= 2 Points: High Risk  1 - 1.99 Points: Medium Risk  0 Points: Low Risk    A final score could not be computed because of missing components.: Last   Change: N/A     This score determines the patient's risk of having a stroke if the  patient has atrial fibrillation.      This score is not applicable to this patient. Components are not  calculated.                  Labs are unremarkable.  Pt still dizzy.  MRI ordered and is pending at shift change.  Pt signed out to Dr. Gustavus Messing at shift change.   Final Clinical Impression(s) / ED Diagnoses Final diagnoses:  Dizziness    Rx / DC Orders ED Discharge Orders    None       Isla Pence, MD 11/26/19 [place]    Isla Pence, MD 11/26/19 1540

## 2019-11-26 NOTE — ED Notes (Signed)
Pt reported a small improvement in N and dizziness with medications.

## 2019-11-27 ENCOUNTER — Other Ambulatory Visit: Payer: Self-pay

## 2019-11-27 DIAGNOSIS — Z79899 Other long term (current) drug therapy: Secondary | ICD-10-CM | POA: Diagnosis not present

## 2019-11-27 DIAGNOSIS — D649 Anemia, unspecified: Secondary | ICD-10-CM | POA: Diagnosis not present

## 2019-11-27 DIAGNOSIS — M810 Age-related osteoporosis without current pathological fracture: Secondary | ICD-10-CM | POA: Diagnosis not present

## 2019-11-27 DIAGNOSIS — R0989 Other specified symptoms and signs involving the circulatory and respiratory systems: Secondary | ICD-10-CM | POA: Diagnosis not present

## 2019-11-27 DIAGNOSIS — R42 Dizziness and giddiness: Secondary | ICD-10-CM | POA: Diagnosis not present

## 2019-11-27 DIAGNOSIS — I341 Nonrheumatic mitral (valve) prolapse: Secondary | ICD-10-CM | POA: Diagnosis present

## 2019-11-27 DIAGNOSIS — Z888 Allergy status to other drugs, medicaments and biological substances status: Secondary | ICD-10-CM | POA: Diagnosis not present

## 2019-11-27 DIAGNOSIS — Z87440 Personal history of urinary (tract) infections: Secondary | ICD-10-CM | POA: Diagnosis not present

## 2019-11-27 DIAGNOSIS — Z90722 Acquired absence of ovaries, bilateral: Secondary | ICD-10-CM | POA: Diagnosis not present

## 2019-11-27 DIAGNOSIS — R030 Elevated blood-pressure reading, without diagnosis of hypertension: Secondary | ICD-10-CM | POA: Diagnosis not present

## 2019-11-27 DIAGNOSIS — Z9079 Acquired absence of other genital organ(s): Secondary | ICD-10-CM | POA: Diagnosis not present

## 2019-11-27 DIAGNOSIS — I9589 Other hypotension: Secondary | ICD-10-CM | POA: Diagnosis not present

## 2019-11-27 DIAGNOSIS — Z90710 Acquired absence of both cervix and uterus: Secondary | ICD-10-CM | POA: Diagnosis not present

## 2019-11-27 DIAGNOSIS — E785 Hyperlipidemia, unspecified: Secondary | ICD-10-CM

## 2019-11-27 DIAGNOSIS — Z20828 Contact with and (suspected) exposure to other viral communicable diseases: Secondary | ICD-10-CM | POA: Diagnosis not present

## 2019-11-27 DIAGNOSIS — H547 Unspecified visual loss: Secondary | ICD-10-CM | POA: Diagnosis not present

## 2019-11-27 DIAGNOSIS — G35 Multiple sclerosis: Secondary | ICD-10-CM | POA: Diagnosis not present

## 2019-11-27 DIAGNOSIS — L8990 Pressure ulcer of unspecified site, unspecified stage: Secondary | ICD-10-CM | POA: Insufficient documentation

## 2019-11-27 DIAGNOSIS — D62 Acute posthemorrhagic anemia: Secondary | ICD-10-CM | POA: Diagnosis not present

## 2019-11-27 DIAGNOSIS — G8220 Paraplegia, unspecified: Secondary | ICD-10-CM | POA: Diagnosis not present

## 2019-11-27 DIAGNOSIS — I69398 Other sequelae of cerebral infarction: Secondary | ICD-10-CM | POA: Diagnosis not present

## 2019-11-27 DIAGNOSIS — D638 Anemia in other chronic diseases classified elsewhere: Secondary | ICD-10-CM | POA: Diagnosis not present

## 2019-11-27 DIAGNOSIS — R278 Other lack of coordination: Secondary | ICD-10-CM | POA: Diagnosis not present

## 2019-11-27 DIAGNOSIS — K592 Neurogenic bowel, not elsewhere classified: Secondary | ICD-10-CM | POA: Diagnosis not present

## 2019-11-27 DIAGNOSIS — Z993 Dependence on wheelchair: Secondary | ICD-10-CM | POA: Diagnosis not present

## 2019-11-27 DIAGNOSIS — N319 Neuromuscular dysfunction of bladder, unspecified: Secondary | ICD-10-CM | POA: Diagnosis not present

## 2019-11-27 DIAGNOSIS — L89151 Pressure ulcer of sacral region, stage 1: Secondary | ICD-10-CM | POA: Diagnosis not present

## 2019-11-27 LAB — CBC WITH DIFFERENTIAL/PLATELET
Abs Immature Granulocytes: 0.02 10*3/uL (ref 0.00–0.07)
Basophils Absolute: 0 10*3/uL (ref 0.0–0.1)
Basophils Relative: 0 %
Eosinophils Absolute: 0 10*3/uL (ref 0.0–0.5)
Eosinophils Relative: 0 %
HCT: 38.3 % (ref 36.0–46.0)
Hemoglobin: 12.8 g/dL (ref 12.0–15.0)
Immature Granulocytes: 0 %
Lymphocytes Relative: 9 %
Lymphs Abs: 0.5 10*3/uL — ABNORMAL LOW (ref 0.7–4.0)
MCH: 31.4 pg (ref 26.0–34.0)
MCHC: 33.4 g/dL (ref 30.0–36.0)
MCV: 93.9 fL (ref 80.0–100.0)
Monocytes Absolute: 0 10*3/uL — ABNORMAL LOW (ref 0.1–1.0)
Monocytes Relative: 1 %
Neutro Abs: 5.2 10*3/uL (ref 1.7–7.7)
Neutrophils Relative %: 90 %
Platelets: 328 10*3/uL (ref 150–400)
RBC: 4.08 MIL/uL (ref 3.87–5.11)
RDW: 11.7 % (ref 11.5–15.5)
WBC: 5.8 10*3/uL (ref 4.0–10.5)
nRBC: 0 % (ref 0.0–0.2)

## 2019-11-27 LAB — RENAL FUNCTION PANEL
Albumin: 3.1 g/dL — ABNORMAL LOW (ref 3.5–5.0)
Anion gap: 8 (ref 5–15)
BUN: 7 mg/dL — ABNORMAL LOW (ref 8–23)
CO2: 21 mmol/L — ABNORMAL LOW (ref 22–32)
Calcium: 8.8 mg/dL — ABNORMAL LOW (ref 8.9–10.3)
Chloride: 114 mmol/L — ABNORMAL HIGH (ref 98–111)
Creatinine, Ser: 0.53 mg/dL (ref 0.44–1.00)
GFR calc Af Amer: >60 mL/min (ref >60)
GFR calc non Af Amer: >60 mL/min (ref >60)
Glucose, Bld: 136 mg/dL — ABNORMAL HIGH (ref 70–99)
Phosphorus: 2.5 mg/dL (ref 2.5–4.6)
Potassium: 4 mmol/L (ref 3.5–5.1)
Sodium: 143 mmol/L (ref 135–145)

## 2019-11-27 MED ORDER — SODIUM CHLORIDE 0.9 % IV SOLN
1000.0000 mg | Freq: Every day | INTRAVENOUS | Status: DC
  Administered 2019-11-27 – 2019-11-30 (×4): 1000 mg via INTRAVENOUS
  Filled 2019-11-27 (×6): qty 8

## 2019-11-27 MED ORDER — METHENAMINE MANDELATE 1 G PO TABS
1.0000 g | ORAL_TABLET | Freq: Every day | ORAL | Status: DC
  Administered 2019-11-27 – 2019-12-02 (×4): 1 g via ORAL
  Filled 2019-11-27 (×7): qty 1

## 2019-11-27 MED ORDER — ACETAMINOPHEN 650 MG RE SUPP: 650.0000 mg | Freq: Four times a day (QID) | RECTAL | Status: DC | PRN

## 2019-11-27 MED ORDER — ONDANSETRON HCL 4 MG PO TABS: 4.0000 mg | ORAL_TABLET | Freq: Four times a day (QID) | ORAL | Status: DC | PRN

## 2019-11-27 MED ORDER — GADOBUTROL 1 MMOL/ML IV SOLN
5.0000 mL | Freq: Once | INTRAVENOUS | Status: AC | PRN
  Administered 2019-11-27: 5 mL via INTRAVENOUS

## 2019-11-27 MED ORDER — ENOXAPARIN SODIUM 40 MG/0.4ML ~~LOC~~ SOLN
40.0000 mg | SUBCUTANEOUS | Status: DC
  Administered 2019-11-27 – 2019-12-02 (×6): 40 mg via SUBCUTANEOUS
  Filled 2019-11-27 (×6): qty 0.4

## 2019-11-27 MED ORDER — ACETAMINOPHEN 325 MG PO TABS: 650.0000 mg | ORAL_TABLET | Freq: Four times a day (QID) | ORAL | Status: DC | PRN

## 2019-11-27 MED ORDER — LORAZEPAM 2 MG/ML IJ SOLN
0.5000 mg | Freq: Once | INTRAMUSCULAR | Status: AC
  Administered 2019-11-27: 0.5 mg via INTRAVENOUS
  Filled 2019-11-27: qty 1

## 2019-11-27 MED ORDER — ONDANSETRON HCL 4 MG/2ML IJ SOLN: 4.0000 mg | Freq: Four times a day (QID) | INTRAMUSCULAR | Status: DC | PRN

## 2019-11-27 MED ORDER — SODIUM CHLORIDE 0.9 % IV BOLUS
500.0000 mL | Freq: Once | INTRAVENOUS | Status: AC
  Administered 2019-11-27: 500 mL via INTRAVENOUS

## 2019-11-27 MED ORDER — POLYETHYLENE GLYCOL 3350 17 G PO PACK: 17.0000 g | PACK | Freq: Every day | ORAL | Status: DC | PRN

## 2019-11-27 NOTE — Progress Notes (Signed)
Spoke with pt son, Shanon Brow, about plan of care. Both pt and son have questions about a "vitimin b-12 shot" for energy, they would like to speak with a provider about this. All other questions answered to satisfaction. Rosina Cressler Elon Spanner, RN 11/27/2019 5:44 PM

## 2019-11-27 NOTE — Plan of Care (Signed)

## 2019-11-27 NOTE — Progress Notes (Signed)
73 yo female presenting with MS flare. See H&P for full details. Continue steroids. Appreciate neuro help No acute events this AM. No changes at this time. Continue as per H&P.   General: 73 y.o. female resting in bed in NAD Cardiovascular: RRR, +S1, S2 Respiratory: CTABL, normal WOB GI: BS+, NDNT, soft MSK: No e/c/c Neuro: somnolent   .Jonnie Finner, DO

## 2019-11-27 NOTE — ED Notes (Signed)
ED TO INPATIENT HANDOFF REPORT  ED Nurse Name and Phone #: Mickeal Skinner [phone]  S Name/Age/Gender [name] 73 y.o. female Room/Bed: 028C/028C  Code Status   Code Status: Not on file  Home/SNF/Other Home Patient oriented to: self, place, time and situation Is this baseline? Yes   Triage Complete: Triage complete  Chief Complaint Multiple sclerosis exacerbation (HCC) [G35]  Triage Note Pt in from home via [facility] with c/o dizziness and weakness x 3 wks. States this am at 0700, it became worse. When EMS arrived, pt pale and nauseous. Given 1LNS en route and 4mg  Zofran. BP 140/70 and CBG 143. Hx of MS, pt is wheelchair bound    Allergies Allergies  Allergen Reactions  . Procaine Hcl     REACTION: UNSPECIFIED    Level of Care/Admitting Diagnosis ED Disposition    ED Disposition Condition Comment   Admit  Hospital Area: [facility] [[id number]]  Level of Care: Med-Surg [16]  Covid Evaluation: Asymptomatic Screening Protocol (No Symptoms)  Diagnosis: Multiple sclerosis exacerbation [facility]) [[id number]]  Admitting Physician: Gery Pray [[id number]]  Attending Physician: Gery Pray [[id number]]  Estimated length of stay: past midnight tomorrow  Certification:: I certify this patient will need inpatient services for at least 2 midnights       B Medical/Surgery History Past Medical History:  Diagnosis Date  . Ankle fracture, left 07/2006  . Ankle fracture, right 10/30/06   [facility]  . Compression fracture 2011   Dr. Dion Saucier   . Disc herniation    L5, S1  . MS (multiple sclerosis) (HCC)    evaluated by Dr. Sandria Manly  . MVP (mitral valve prolapse) 1979   ECHO. Trace MR, trace TR, trace PR 07/17/03  . Nephrolithiasis   . Osteoporosis   . Rib fractures 1998, [id number]   2  . Toe fracture 1998   5   Past Surgical History:  Procedure Laterality Date  . LUMBAR DISC SURGERY  04/2007   L5, S1- [facility]  . SEPTOPLASTY  1983   deviated septum  . TONSILLECTOMY AND ADENOIDECTOMY   1960  . TOTAL ABDOMINAL HYSTERECTOMY W/ BILATERAL SALPINGOOPHORECTOMY  1982   for endometriosis     A IV Location/Drains/Wounds Patient Lines/Drains/Airways Status   Active Line/Drains/Airways    Name:   Placement date:   Placement time:   Site:   Days:   Peripheral IV 11/26/19 Left Antecubital   11/26/19    1016    Antecubital   1   Peripheral IV 11/26/19 Right Forearm   11/26/19    2318    Forearm   1          Intake/Output Last 24 hours  Intake/Output Summary (Last 24 hours) at 11/27/2019 0431 Last data filed at 11/27/2019 0403 Gross per 24 hour  Intake 1050 ml  Output --  Net 1050 ml    Labs/Imaging Results for orders placed or performed during the hospital encounter of 11/26/19 (from the past 48 hour(s))  Urinalysis, Routine w reflex microscopic     Status: Abnormal   Collection Time: 11/26/19 10:46 AM  Result Value Ref Range   Color, Urine STRAW (A) YELLOW   APPearance CLEAR CLEAR   Specific Gravity, Urine 1.010 1.005 - 1.030   pH 7.0 5.0 - 8.0   Glucose, UA 50 (A) NEGATIVE mg/dL   Hgb urine dipstick NEGATIVE NEGATIVE   Bilirubin Urine NEGATIVE NEGATIVE   Ketones, ur 20 (A) NEGATIVE mg/dL   Protein, ur NEGATIVE NEGATIVE mg/dL  Nitrite NEGATIVE NEGATIVE   Leukocytes,Ua NEGATIVE NEGATIVE    Comment: Performed at [facility], [address], [place], [place] [address]  Comprehensive metabolic panel     Status: Abnormal   Collection Time: 11/26/19 10:58 AM  Result Value Ref Range   Sodium 139 135 - 145 mmol/L   Potassium 3.9 3.5 - 5.1 mmol/L   Chloride 107 98 - 111 mmol/L   CO2 23 22 - 32 mmol/L   Glucose, Bld 126 (H) 70 - 99 mg/dL   BUN 13 8 - 23 mg/dL   Creatinine, Ser 0.23 0.44 - 1.00 mg/dL   Calcium 8.4 (L) 8.9 - 10.3 mg/dL   Total Protein 6.1 (L) 6.5 - 8.1 g/dL   Albumin 3.5 3.5 - 5.0 g/dL   AST 20 15 - 41 U/L   ALT 18 0 - 44 U/L   Alkaline Phosphatase 64 38 - 126 U/L   Total Bilirubin 0.8 0.3 - 1.2 mg/dL   GFR calc non Af Amer >60 >60  mL/min   GFR calc Af Amer >60 >60 mL/min   Anion gap 9 5 - 15    Comment: Performed at [facility], [address] [place], [place] [address]  CBC with Differential     Status: Abnormal   Collection Time: 11/26/19 10:58 AM  Result Value Ref Range   WBC 14.3 (H) 4.0 - 10.5 K/uL   RBC 4.22 3.87 - 5.11 MIL/uL   Hemoglobin 13.1 12.0 - 15.0 g/dL   HCT 86.1 68.3 - 72.9 %   MCV 95.3 80.0 - 100.0 fL   MCH 31.0 26.0 - 34.0 pg   MCHC 32.6 30.0 - 36.0 g/dL   RDW 02.1 11.5 - 52.0 %   Platelets 344 150 - 400 K/uL   nRBC 0.0 0.0 - 0.2 %   Neutrophils Relative % 92 %   Neutro Abs 13.2 (H) 1.7 - 7.7 K/uL   Lymphocytes Relative 5 %   Lymphs Abs 0.8 0.7 - 4.0 K/uL   Monocytes Relative 2 %   Monocytes Absolute 0.3 0.1 - 1.0 K/uL   Eosinophils Relative 0 %   Eosinophils Absolute 0.0 0.0 - 0.5 K/uL   Basophils Relative 0 %   Basophils Absolute 0.0 0.0 - 0.1 K/uL   Immature Granulocytes 1 %   Abs Immature Granulocytes 0.07 0.00 - 0.07 K/uL    Comment: Performed at [facility], [address], [place], [place] [address]  Troponin I (High Sensitivity)     Status: None   Collection Time: 11/26/19 10:58 AM  Result Value Ref Range   Troponin I (High Sensitivity) <2 <18 ng/L    Comment: Performed at [facility], [address] [place], [place] [address]  SARS CORONAVIRUS 2 (TAT 6-24 HRS) Nasopharyngeal Nasopharyngeal Swab     Status: None   Collection Time: 11/26/19 11:45 AM   Specimen: Nasopharyngeal Swab  Result Value Ref Range   SARS Coronavirus 2 NEGATIVE NEGATIVE    Comment: (NOTE) SARS-CoV-2 target nucleic acids are NOT DETECTED. The SARS-CoV-2 RNA is generally detectable in upper and lower respiratory specimens during the acute phase of infection. Negative results do not preclude SARS-CoV-2 infection, do not rule out co-infections with other pathogens, and should not be used as the sole basis for treatment or other patient management decisions. Negative results must  be combined with clinical observations, patient history, and epidemiological information. The expected result is Negative. Fact Sheet for Patients: HairSlick.no Fact  Sheet for Healthcare Providers: quierodirigir.comhttps://www.fda.gov/media/138095/download This test is not yet approved or cleared by the [place] FDA and  has been authorized for detection and/or diagnosis of SARS-CoV-2 by FDA under an Emergency Use Authorization (EUA). This EUA will remain  in effect (meaning this test can be used) for the duration of the COVID-19 declaration under Section 56 4(b)(1) of the Act, 21 U.S.C. section 360bbb-3(b)(1), unless the authorization is terminated or revoked sooner. Performed at [facility], [address] [place], [place] [address]   Troponin I (High Sensitivity)     Status: None   Collection Time: 11/26/19  1:50 PM  Result Value Ref Range   Troponin I (High Sensitivity) <2 <18 ng/L    Comment: Performed at [facility], [address] [place], [place] [address]   CT Head Wo Contrast  Result Date: 11/26/2019 CLINICAL DATA:  Dizziness and weakness for 3 weeks EXAM: CT HEAD WITHOUT CONTRAST TECHNIQUE: Contiguous axial images were obtained from the base of the skull through the vertex without intravenous contrast. COMPARISON:  None. FINDINGS: Brain: Brain atrophy noted with white matter microvascular ischemic changes about the lateral ventricle throughout both cerebral hemispheres. No acute intracranial hemorrhage definite new infarction mass lesion, midline shift, herniation, hydrocephalus, extra-axial fluid collection. No focal mass effect or edema. Cisterns are patent. No cerebellar abnormality. Vascular: Intracranial atherosclerosis noted.  No hyperdense vessel. Skull: Normal. Negative for fracture or focal lesion. Sinuses/Orbits: No acute finding. Other: None. IMPRESSION: Brain atrophy and chronic white matter microvascular changes. No acute intracranial  abnormality by noncontrast CT. Electronically Signed   By: Judie PetitM.  Shick M.D.   On: 11/26/2019 11:37   MR BRAIN WO CONTRAST  Result Date: 11/26/2019 CLINICAL DATA:  Focal neurologic deficit. Suspect stroke. History of multiple sclerosis. EXAM: MRI HEAD WITHOUT CONTRAST TECHNIQUE: Multiplanar, multiecho pulse sequences of the brain and surrounding structures were obtained without intravenous contrast. COMPARISON:  CT head 11/26/2019.  No prior MRI for comparison. FINDINGS: Brain: Negative for acute infarct. Numerous periventricular white matter hyperintensities. Pattern consistent with multiple sclerosis. Hyperintensities extend into the deep white matter in the parietal lobe bilaterally. Brainstem and cerebellum intact. Cerebral volume mildly diminished but normal for age. Negative for hydrocephalus. Negative for hemorrhage or mass. Vascular: Normal arterial flow voids Skull and upper cervical spine: Negative Sinuses/Orbits: Negative Other: None IMPRESSION: Negative for acute infarct Periventricular deep white matter hyperintensities compatible with multiple sclerosis. No prior MRI for comparison purposes. Electronically Signed   By: Marlan Palauharles  Clark M.D.   On: 11/26/2019 17:15   MR BRAIN W CONTRAST  Result Date: 11/27/2019 CLINICAL DATA:  Peripheral vertigo EXAM: MRI HEAD WITH CONTRAST TECHNIQUE: Multiplanar, multiecho pulse sequences of the brain and surrounding structures were obtained with intravenous contrast. CONTRAST:  5mL GADAVIST GADOBUTROL 1 MMOL/ML IV SOLN COMPARISON:  Brain MRI 11/26/2019 FINDINGS: There are 2 subtle foci of contrast enhancement within the periventricular white matter. The first is on the right, and the periatrial region (series 12, image 9). The second is on the left, near the superior left lateral ventricle (series 13, image 21). No other abnormal contrast enhancement. IMPRESSION: Two small foci of contrast enhancement consistent with active demyelination within the periventricular  white matter. Electronically Signed   By: Deatra RobinsonKevin  Herman M.D.   On: 11/27/2019 01:04   DG Chest Portable 1 View  Result Date: 11/26/2019 CLINICAL DATA:  Pt in from home via [facility] with c/o dizziness and weakness x 3 wks. EXAM: PORTABLE CHEST 1 VIEW COMPARISON:  Chest radiograph 04/23/2007 FINDINGS: The heart size and mediastinal contours are within normal limits. Aortic arch calcification. The lungs are clear bilaterally. No pneumothorax or pleural effusion. No acute finding in the visualized skeleton. IMPRESSION: 1. No acute cardiopulmonary process. 2. Aortic atherosclerosis. Electronically Signed   By: Audie Pinto M.D.   On: 11/26/2019 11:38    Pending Labs Unresulted Labs (From admission, onward)   None      Vitals/Pain Today's Vitals   11/27/19 0300 11/27/19 0330 11/27/19 0400 11/27/19 0430  BP: (!) 95/58 92/66 (!) 97/57 111/62  Pulse: 62 70 68 64  Resp: 15 16 14 15   Temp:      SpO2: 100% 100% 95% 100%  Weight:      PainSc:        Isolation Precautions No active isolations  Medications Medications  methylPREDNISolone sodium succinate (SOLU-MEDROL) 1,000 mg in sodium chloride 0.9 % 50 mL IVPB (0 mg Intravenous Stopped 11/27/19 0403)  sodium chloride 0.9 % bolus 1,000 mL (0 mLs Intravenous Stopped 11/26/19 1345)  meclizine (ANTIVERT) tablet 25 mg (25 mg Oral Given 11/26/19 1154)  promethazine (PHENERGAN) injection 12.5 mg (12.5 mg Intravenous Given 11/26/19 1151)  LORazepam (ATIVAN) injection 1 mg (1 mg Intravenous Given 11/26/19 1605)  sodium chloride 0.9 % bolus 1,000 mL (0 mLs Intravenous Stopped 11/26/19 2235)  meclizine (ANTIVERT) tablet 25 mg (25 mg Oral Given 11/26/19 2328)  LORazepam (ATIVAN) injection 0.5 mg (0.5 mg Intravenous Given 11/27/19 0028)  gadobutrol (GADAVIST) 1 MMOL/ML injection 5 mL (5 mLs Intravenous Contrast Given 11/27/19 0038)  sodium chloride 0.9 % bolus 500 mL (500 mLs Intravenous New Bag/Given 11/27/19 0404)     Mobility non-ambulatory Moderate fall risk   Focused Assessments Neuro Assessment Handoff:  Swallow screen pass? Yes  Cardiac Rhythm: Normal sinus rhythm       Neuro Assessment:   Neuro Checks:      Last Documented NIHSS Modified Score:   Has TPA been given? No If patient is a Neuro Trauma and patient is going to OR before floor call report to [place] nurse: [phone] or [phone]     R Recommendations: See Admitting Provider Note  Report given to:   Additional Notes:

## 2019-11-27 NOTE — Evaluation (Signed)
Physical Therapy Evaluation Patient Details Name: [name] MRN: [id number] DOB: August 04, 1946 Today's Date: 11/27/2019   History of Present Illness  Admitted with balance deficits, dizziness, MS exacerbation;  has a past medical history of Ankle fractures, Compression fracture (2011), Disc herniation, MS (multiple sclerosis) ([place]), MVP (mitral valve prolapse) (1979),  Osteoporosis, Rib fractures (1998, 5284,1324), and Toe fracture (1998).  Clinical Impression   Pt admitted with above diagnosis. Completely independent prior to admission, with an excellent setup to manage well at a wheelchair-transfer functional level (did not need a sliding board); Son assists with IADLs; Presents to PT with vertigo, effecting sitting balance and tolerance; She is motivated to go home, and will participate and work on therex to help with the vertigo; recommend the more intense therapies on CIR to maximize independence and safety with mobility, and allow her to dc home;  Pt currently with functional limitations due to the deficits listed below (see PT Problem List). Pt will benefit from skilled PT to increase their independence and safety with mobility to allow discharge to the venue listed below.       Follow Up Recommendations CIR    Equipment Recommendations  None recommended by PT(quite well-equipped)    Recommendations for Other Services OT consult(ordered per protocol)     Precautions / Restrictions Precautions Precautions: Fall      Mobility  Bed Mobility Overal bed mobility: Needs Assistance Bed Mobility: Supine to Sit     Supine to sit: Mod assist     General bed mobility comments: She initiated push through elbows and hands to get to long sit; Still, in long sit, with significant posterior pelvic tilt, and required mod assist to come to fully upright sitting; then mod assist to turn and square off hips at EOB  Transfers Overall transfer level: Needs assistance Equipment used:  None Transfers: Lateral/Scoot Transfers          Lateral/Scoot Transfers: Mod assist General transfer comment: Simulated lateral scoot transfers with scooting towards [place]; Good push trhough UEs; with today's work, noted she tended to lean backwards and push through UEs to [employer] hips to scoot  Ambulation/Gait                Stairs            Wheelchair Mobility    Modified Rankin (Stroke Patients Only)       Balance Overall balance assessment: Needs assistance Sitting-balance support: Bilateral upper extremity supported Sitting balance-Leahy Scale: Poor Sitting balance - Comments: tendency to posterior pelvic tilt and required mod assist to prevent fall backwards sitting EOB; REported considerable dizziness sitting up to EOB Postural control: Posterior lean                                   Pertinent Vitals/Pain Pain Assessment: No/denies pain    Home Living Family/patient expects to be discharged to:: Private residence Living Arrangements: Alone Available Help at Discharge: Family;Other (Comment)(Son brings food, grocery shops) Type of Home: House Home Access: [place]: One [place]: Wheelchair - power;Bedside commode Additional Comments: She has quite a setup that allows her to be independent at wheelchair transfer level    Prior Function Level of Independence: Independent with assistive device(s)         Comments: Independent at power chair transfer level; drives to sink to sponge bathe; mostly microwave meals; her [facility]  is welded to her bed so she doesn't worry about it sliding     Hand Dominance        Extremity/Trunk Assessment   Upper Extremity Assessment Upper Extremity Assessment: Defer to OT evaluation    Lower Extremity Assessment Lower Extremity Assessment: RLE deficits/detail;LLE deficits/detail RLE Deficits / Details: Did not observe volitional movement in LEs; adequate ROM for  her to pull her leg up, flex her knee to get to her foot to don socks LLE Deficits / Details: Did not observe volitional movement in LEs; adequate ROM for her to pull her leg up, flex her knee to get to her foot to don socks       Communication   Communication: No difficulties  Cognition Arousal/Alertness: Awake/alert Behavior During Therapy: WFL for tasks assessed/performed Overall Cognitive Status: Within Functional Limits for tasks assessed                                        General Comments      Exercises     Assessment/Plan    PT Assessment Patient needs continued PT services  PT Problem List Decreased strength;Decreased activity tolerance;Decreased balance;Decreased mobility;Decreased coordination;Decreased knowledge of use of DME;Decreased safety awareness;Decreased knowledge of precautions;Impaired sensation;Impaired tone       PT Treatment Interventions DME instruction;Functional mobility training;Therapeutic activities;Therapeutic exercise;Balance training;Neuromuscular re-education;Cognitive remediation;Patient/family education    PT Goals (Current goals can be found in the Care Plan section)  Acute Rehab PT Goals Patient Stated Goal: to go home PT Goal Formulation: With patient Time For Goal Achievement: 12/11/19 Potential to Achieve Goals: Good    Frequency Min 4X/week   Barriers to discharge        Co-evaluation               AM-PAC PT "6 Clicks" Mobility  Outcome Measure Help needed turning from your back to your side while in a flat bed without using bedrails?: A Little Help needed moving from lying on your back to sitting on the side of a flat bed without using bedrails?: A Lot Help needed moving to and from a bed to a chair (including a wheelchair)?: A Lot Help needed standing up from a chair using your arms (e.g., wheelchair or bedside chair)?: Total Help needed to walk in hospital room?: Total Help needed climbing 3-5  steps with a railing? : Total 6 Click Score: 10    End of Session   Activity Tolerance: Patient tolerated treatment well Patient left: in bed;with call bell/phone within reach Nurse Communication: Mobility status PT Visit Diagnosis: Other abnormalities of gait and mobility (R26.89);Dizziness and giddiness (R42)    Time: 1610-9604 PT Time Calculation (min) (ACUTE ONLY): 35 min   Charges:   PT Evaluation $PT Eval Moderate Complexity: 1 Mod PT Treatments $Therapeutic Activity: 8-22 mins        [name], PT  Acute Rehabilitation Services Pager [phone] Office [phone]   [name] 11/27/2019, 4:39 PM

## 2019-11-27 NOTE — H&P (Signed)
PCP:   Venia Carbon, MD   Code status: Full Code  Chief Complaint:  Dizziness   HPI: this is a 73 year old female who is wheelchair bound secondary to multiple sclerosis.  She presents with complaint of dizziness. She got up this morning, went to use the bathroom, on completing her ADLs she found she could not sit up because she became very dizzy. Her dizziness lasted all day.  She was also nauseous and gagging but did not vomit. She denies any tinnitus. She denies headaches. Her dizziness continues in the ER. She denies any increasing weakness.  She denies any fevers or chills. Her son came over around 7 and brought her to the ER.  She has a h/o MS. MRI head with contrast shows MS flare. The hospitalist have been asked to admit. Neuro has seen patient in consult in [facility].  History provided by the patient  Review of Systems:  The patient denies anorexia, fever, weight loss,, vision loss, decreased hearing, hoarseness, chest pain, syncope, dyspnea on exertion, peripheral edema, balance deficits, vertigo, hemoptysis, abdominal pain, melena, hematochezia, severe indigestion/heartburn, hematuria, incontinence, genital sores, muscle weakness, suspicious skin lesions, transient blindness, difficulty walking, depression, unusual weight change, abnormal bleeding, enlarged lymph nodes, angioedema, and breast masses.  Past Medical History: Past Medical History:  Diagnosis Date  . Ankle fracture, left 07/2006  . Ankle fracture, right 10/30/06   [facility]  . Compression fracture 2011   Dr. Mardelle Matte   . Disc herniation    L5, S1  . MS (multiple sclerosis) ([place])    evaluated by Dr. Erling Cruz  . MVP (mitral valve prolapse) 1979   ECHO. Trace MR, trace TR, trace PR 07/17/03  . Nephrolithiasis   . Osteoporosis   . Rib fractures 1998, [id number]   2  . Toe fracture 1998   5   Past Surgical History:  Procedure Laterality Date  . LUMBAR DISC SURGERY  04/2007   L5, S1- [facility]  . SEPTOPLASTY  1983   deviated  septum  . TONSILLECTOMY AND ADENOIDECTOMY  1960  . TOTAL ABDOMINAL HYSTERECTOMY W/ BILATERAL SALPINGOOPHORECTOMY  1982   for endometriosis    Medications: Prior to Admission medications   Medication Sig Start Date End Date Taking? Authorizing Provider  methenamine (HIPREX) 1 g tablet Take 1 g by mouth daily.   Yes [provider]  meclizine (ANTIVERT) 25 MG tablet Take 1 tablet (25 mg total) by mouth 3 (three) times daily as needed for dizziness. 11/26/19   Tegeler, Gwenyth Allegra, MD  promethazine (PHENERGAN) 25 MG tablet Take 1 tablet (25 mg total) by mouth every 6 (six) hours as needed for nausea or vomiting. 11/26/19   Tegeler, Gwenyth Allegra, MD    Allergies:   Allergies  Allergen Reactions  . Procaine Hcl     REACTION: UNSPECIFIED    Social History:  reports that she has never smoked. She has never used smokeless tobacco. She reports that she does not drink alcohol or use drugs.  Family History: Family History  Problem Relation Age of Onset  . Stroke Mother   . Neuropathy Brother   . Stroke Father   . Hypertension Neg Hx   . Diabetes Neg Hx   . Depression Neg Hx   . Alcohol abuse Neg Hx   . Drug abuse Neg Hx     Physical Exam: Vitals:   11/26/19 2330 11/27/19 0200 11/27/19 0230 11/27/19 0300  BP: 108/67 (!) 95/51 (!) 105/52 (!) 95/58  Pulse: 62 63  69 62  Resp: 15 14 15 15   Temp:      SpO2: 98% 95% 90% 100%  Weight:        General:  Alert and oriented times three, well developed and nourished, no acute distress Eyes: PERRLA, pink conjunctiva, no scleral icterus ENT: Moist oral mucosa, neck supple, no thyromegaly Lungs: clear to ascultation, no wheeze, no crackles, no use of accessory muscles Cardiovascular: regular rate and rhythm, no regurgitation, no gallops, no murmurs. No carotid bruits, no JVD Abdomen: soft, positive BS, non-tender, non-distended, no organomegaly, not an acute abdomen GU: not examined Neuro: CN II - XII grossly intact,  sensation intact Musculoskeletal: strength 3/5 B/L LE, atrophy, no clubbing, cyanosis or edema Skin: no rash, no subcutaneous crepitation, no decubitus Psych: appropriate patient   Labs on Admission:  Recent Labs    11/26/19 1058  NA 139  K 3.9  CL 107  CO2 23  GLUCOSE 126*  BUN 13  CREATININE 0.60  CALCIUM 8.4*   Recent Labs    11/26/19 1058  AST 20  ALT 18  ALKPHOS 64  BILITOT 0.8  PROT 6.1*  ALBUMIN 3.5   No results for input(s): LIPASE, AMYLASE in the last 72 hours. Recent Labs    11/26/19 1058  WBC 14.3*  NEUTROABS 13.2*  HGB 13.1  HCT 40.2  MCV 95.3  PLT 344   No results for input(s): CKTOTAL, CKMB, CKMBINDEX, TROPONINI in the last 72 hours. Invalid input(s): POCBNP No results for input(s): DDIMER in the last 72 hours. No results for input(s): HGBA1C in the last 72 hours. No results for input(s): CHOL, HDL, LDLCALC, TRIG, CHOLHDL, LDLDIRECT in the last 72 hours. No results for input(s): TSH, T4TOTAL, T3FREE, THYROIDAB in the last 72 hours.  Invalid input(s): FREET3 No results for input(s): VITAMINB12, FOLATE, FERRITIN, TIBC, IRON, RETICCTPCT in the last 72 hours.  Micro Results: Recent Results (from the past 240 hour(s))  SARS CORONAVIRUS 2 (TAT 6-24 HRS) Nasopharyngeal Nasopharyngeal Swab     Status: None   Collection Time: 11/26/19 11:45 AM   Specimen: Nasopharyngeal Swab  Result Value Ref Range Status   SARS Coronavirus 2 NEGATIVE NEGATIVE Final    Comment: (NOTE) SARS-CoV-2 target nucleic acids are NOT DETECTED. The SARS-CoV-2 RNA is generally detectable in upper and lower respiratory specimens during the acute phase of infection. Negative results do not preclude SARS-CoV-2 infection, do not rule out co-infections with other pathogens, and should not be used as the sole basis for treatment or other patient management decisions. Negative results must be combined with clinical observations, patient history, and epidemiological information. The  expected result is Negative. Fact Sheet for Patients: HairSlick.no Fact Sheet for Healthcare Providers: quierodirigir.com This test is not yet approved or cleared by the [place] FDA and  has been authorized for detection and/or diagnosis of SARS-CoV-2 by FDA under an Emergency Use Authorization (EUA). This EUA will remain  in effect (meaning this test can be used) for the duration of the COVID-19 declaration under Section 56 4(b)(1) of the Act, 21 U.S.C. section 360bbb-3(b)(1), unless the authorization is terminated or revoked sooner. Performed at [facility], [address], [place], [place] [address]      Radiological Exams on Admission: CT Head Wo Contrast  Result Date: 11/26/2019 CLINICAL DATA:  Dizziness and weakness for 3 weeks EXAM: CT HEAD WITHOUT CONTRAST TECHNIQUE: Contiguous axial images were obtained from the base of the skull through the vertex without intravenous contrast. COMPARISON:  None.  FINDINGS: Brain: Brain atrophy noted with white matter microvascular ischemic changes about the lateral ventricle throughout both cerebral hemispheres. No acute intracranial hemorrhage definite new infarction mass lesion, midline shift, herniation, hydrocephalus, extra-axial fluid collection. No focal mass effect or edema. Cisterns are patent. No cerebellar abnormality. Vascular: Intracranial atherosclerosis noted.  No hyperdense vessel. Skull: Normal. Negative for fracture or focal lesion. Sinuses/Orbits: No acute finding. Other: None. IMPRESSION: Brain atrophy and chronic white matter microvascular changes. No acute intracranial abnormality by noncontrast CT. Electronically Signed   By: Judie Petit.  Shick M.D.   On: 11/26/2019 11:37   MR BRAIN WO CONTRAST  Result Date: 11/26/2019 CLINICAL DATA:  Focal neurologic deficit. Suspect stroke. History of multiple sclerosis. EXAM: MRI HEAD WITHOUT CONTRAST TECHNIQUE: Multiplanar,  multiecho pulse sequences of the brain and surrounding structures were obtained without intravenous contrast. COMPARISON:  CT head 11/26/2019.  No prior MRI for comparison. FINDINGS: Brain: Negative for acute infarct. Numerous periventricular white matter hyperintensities. Pattern consistent with multiple sclerosis. Hyperintensities extend into the deep white matter in the parietal lobe bilaterally. Brainstem and cerebellum intact. Cerebral volume mildly diminished but normal for age. Negative for hydrocephalus. Negative for hemorrhage or mass. Vascular: Normal arterial flow voids Skull and upper cervical spine: Negative Sinuses/Orbits: Negative Other: None IMPRESSION: Negative for acute infarct Periventricular deep white matter hyperintensities compatible with multiple sclerosis. No prior MRI for comparison purposes. Electronically Signed   By: Marlan Palau M.D.   On: 11/26/2019 17:15   MR BRAIN W CONTRAST  Result Date: 11/27/2019 CLINICAL DATA:  Peripheral vertigo EXAM: MRI HEAD WITH CONTRAST TECHNIQUE: Multiplanar, multiecho pulse sequences of the brain and surrounding structures were obtained with intravenous contrast. CONTRAST:  3mL GADAVIST GADOBUTROL 1 MMOL/ML IV SOLN COMPARISON:  Brain MRI 11/26/2019 FINDINGS: There are 2 subtle foci of contrast enhancement within the periventricular white matter. The first is on the right, and the periatrial region (series 12, image 9). The second is on the left, near the superior left lateral ventricle (series 13, image 21). No other abnormal contrast enhancement. IMPRESSION: Two small foci of contrast enhancement consistent with active demyelination within the periventricular white matter. Electronically Signed   By: Deatra Robinson M.D.   On: 11/27/2019 01:04   DG Chest Portable 1 View  Result Date: 11/26/2019 CLINICAL DATA:  Pt in from home via [facility] with c/o dizziness and weakness x 3 wks. EXAM: PORTABLE CHEST 1 VIEW COMPARISON:  Chest radiograph 04/23/2007  FINDINGS: The heart size and mediastinal contours are within normal limits. Aortic arch calcification. The lungs are clear bilaterally. No pneumothorax or pleural effusion. No acute finding in the visualized skeleton. IMPRESSION: 1. No acute cardiopulmonary process. 2. Aortic atherosclerosis. Electronically Signed   By: Emmaline Kluver M.D.   On: 11/26/2019 11:38    Assessment/Plan Present on Admission: . Multiple sclerosis exacerbation (HCC) . Paraplegia, unspecified (HCC) -Admit to MedSurg -Solu-Medrol 1 g IV daily -PT consult placed -Neuro has seen patient in the ER, continue to monitor with [place] -Gentle IV fluid hydration  Dyslipidemia -Stable, home meds resumed  Anayely Constantine 11/27/2019, 3:53 AM

## 2019-11-27 NOTE — Plan of Care (Signed)

## 2019-11-27 NOTE — ED Provider Notes (Signed)
I assumed care of this patient from Dr. Sherry Ruffing.  Please see their note for further details of Hx, PE.  Briefly patient is a 73 y.o. female who presented vertigo. H/o MS. Pending MRI.  MRI with MS lesions.  Patient evaluated by neurology who recommended admission and 3-day course of IV Solu-Medrol and physical therapy.  We will admit to medicine.      Fatima Blank, MD 11/27/19 [id number]

## 2019-11-27 NOTE — Consult Note (Signed)
Neurology Consultation Reason for Consult: Dizziness Referring Physician: Tegeler, C  CC: Dizziness  History is obtained from: Patient  HPI: [name] is a 73 y.o. female with a history of multiple sclerosis that was diagnosed in 18.  She progressively deteriorated and was not on immune modulating therapy.  She was last seen by Dr. Leta Baptist in 2014.  At that time she was wheelchair-bound with bilateral upper extremity dysmetria.  She has had occasional dizziness spells for the past few weeks.  It has been getting progressively worse and now she was not coping at home and therefore presented to the emergency department.  In the ER she had an MRI which reveals findings consistent with her MS, with 2 enhancing lesions.   ROS: A 14 point ROS was performed and is negative except as noted in the HPI.   Past Medical History:  Diagnosis Date  . Ankle fracture, left 07/2006  . Ankle fracture, right 10/30/06   [facility]  . Compression fracture 2011   Dr. Mardelle Matte   . Disc herniation    L5, S1  . MS (multiple sclerosis) ([place])    evaluated by Dr. Erling Cruz  . MVP (mitral valve prolapse) 1979   ECHO. Trace MR, trace TR, trace PR 07/17/03  . Nephrolithiasis   . Osteoporosis   . Rib fractures 1998, [id number]   2  . Toe fracture 1998   5     Family History  Problem Relation Age of Onset  . Stroke Mother   . Neuropathy Brother   . Stroke Father   . Hypertension Neg Hx   . Diabetes Neg Hx   . Depression Neg Hx   . Alcohol abuse Neg Hx   . Drug abuse Neg Hx      Social History:  reports that she has never smoked. She has never used smokeless tobacco. She reports that she does not drink alcohol or use drugs.   Exam: Current vital signs: BP 108/67   Pulse 62   Temp 98.7 F (37.1 C)   Resp 15   Wt 51.3 kg   SpO2 98%   BMI 20.03 kg/m  Vital signs in last 24 hours: Temp:  [98.7 F (37.1 C)] 98.7 F (37.1 C) (12/12 1040) Pulse Rate:  [52-76] 62 (12/12 2330) Resp:  [12-20] 15  (12/12 2330) BP: (100-132)/(51-76) 108/67 (12/12 2330) SpO2:  [95 %-100 %] 98 % (12/12 2330) Weight:  [51.3 kg] 51.3 kg (12/12 1052)   Physical Exam  Constitutional: Appears well-developed and well-nourished.  Psych: Affect appropriate to situation Eyes: No scleral injection HENT: No OP obstrucion MSK: no joint deformities.  Cardiovascular: Normal rate and regular rhythm.  Respiratory: Effort normal, non-labored breathing GI: Soft.  No distension. There is no tenderness.  Skin: WDI  Neuro: Mental Status: Patient is awake, alert, oriented to person, place, month, year, and situation. Patient is able to give a clear and coherent history. No signs of aphasia or neglect Cranial Nerves: II: Visual Fields are full. Pupils are equal, round, and reactive to light.   III,IV, VI: EOMI without ptosis or diploplia.  She has left beating nystagmus on leftward gaze, right beating nystagmus on rightward gaze and subtle rotary nystagmus with upper gaze. V: Facial sensation is symmetric to temperature VII: Facial movement is symmetric.  VIII: hearing is intact to voice X: Uvula elevates symmetrically XI: Shoulder shrug is symmetric. XII: tongue is midline without atrophy or fasciculations.  Motor: She has 4/5 strength in bilateral upper extremities, 2/5  strength in bilateral lower extremities Sensory: Sensation is symmetric to light touch and temperature in the arms and legs. Deep Tendon Reflexes: 3+ and symmetric in the biceps and ankles Cerebellar: She has mild endpoint tremor on finger-nose-finger bilaterally.   I have reviewed labs in epic and the results pertinent to this consultation are: CMP-unremarkable  I have reviewed the images obtained: MRI brain-very small enhancing lesions in the periventricular white matter.  Impression: 73 year old female with subacute vertigo in the setting of MS.  Though the location of the lesions is not typical for vertigo, her multidirectional  nystagmus on exam is most consistent with a central cause.  Another possibility would be baseline central finding superimposed on a peripheral vertigo, but I do not have any strong reason to suspect this.  At this time, I would favor steroids and physical therapy.  Even if this was peripheral, steroids would likely be prudent for labyrinthitis.  Recommendations: 1) Solu-Medrol 1 g x 3days 2) physical therapy 3) neurology will follow   Ritta Slot, MD [facility] [phone]  If 7pm- 7am, please page neurology on call as listed in AMION.

## 2019-11-27 NOTE — ED Notes (Signed)
Attempted to call report x2

## 2019-11-27 NOTE — ED Notes (Signed)
Attempted to call report x 1  

## 2019-11-27 NOTE — Plan of Care (Signed)

## 2019-11-28 DIAGNOSIS — D649 Anemia, unspecified: Secondary | ICD-10-CM

## 2019-11-28 LAB — RENAL FUNCTION PANEL
Albumin: 2.7 g/dL — ABNORMAL LOW (ref 3.5–5.0)
Anion gap: 8 (ref 5–15)
BUN: 19 mg/dL (ref 8–23)
CO2: 23 mmol/L (ref 22–32)
Calcium: 8.6 mg/dL — ABNORMAL LOW (ref 8.9–10.3)
Chloride: 109 mmol/L (ref 98–111)
Creatinine, Ser: 0.71 mg/dL (ref 0.44–1.00)
GFR calc Af Amer: >60 mL/min (ref >60)
GFR calc non Af Amer: >60 mL/min (ref >60)
Glucose, Bld: 136 mg/dL — ABNORMAL HIGH (ref 70–99)
Phosphorus: 2.2 mg/dL — ABNORMAL LOW (ref 2.5–4.6)
Potassium: 4.3 mmol/L (ref 3.5–5.1)
Sodium: 140 mmol/L (ref 135–145)

## 2019-11-28 LAB — CBC WITH DIFFERENTIAL/PLATELET
Abs Immature Granulocytes: 0.04 10*3/uL (ref 0.00–0.07)
Basophils Absolute: 0 10*3/uL (ref 0.0–0.1)
Basophils Relative: 0 %
Eosinophils Absolute: 0 10*3/uL (ref 0.0–0.5)
Eosinophils Relative: 0 %
HCT: 34.1 % — ABNORMAL LOW (ref 36.0–46.0)
Hemoglobin: 11.4 g/dL — ABNORMAL LOW (ref 12.0–15.0)
Immature Granulocytes: 0 %
Lymphocytes Relative: 12 %
Lymphs Abs: 1.1 10*3/uL (ref 0.7–4.0)
MCH: 31.3 pg (ref 26.0–34.0)
MCHC: 33.4 g/dL (ref 30.0–36.0)
MCV: 93.7 fL (ref 80.0–100.0)
Monocytes Absolute: 1 10*3/uL (ref 0.1–1.0)
Monocytes Relative: 11 %
Neutro Abs: 7.2 10*3/uL (ref 1.7–7.7)
Neutrophils Relative %: 77 %
Platelets: 331 10*3/uL (ref 150–400)
RBC: 3.64 MIL/uL — ABNORMAL LOW (ref 3.87–5.11)
RDW: 11.6 % (ref 11.5–15.5)
WBC: 9.4 10*3/uL (ref 4.0–10.5)
nRBC: 0 % (ref 0.0–0.2)

## 2019-11-28 LAB — IRON AND TIBC
Iron: 130 ug/dL (ref 28–170)
Saturation Ratios: 51 % — ABNORMAL HIGH (ref 10.4–31.8)
TIBC: 255 ug/dL (ref 250–450)
UIBC: 125 ug/dL

## 2019-11-28 LAB — VITAMIN B12: Vitamin B-12: 278 pg/mL (ref 180–914)

## 2019-11-28 LAB — MAGNESIUM: Magnesium: 2 mg/dL (ref 1.7–2.4)

## 2019-11-28 LAB — FERRITIN: Ferritin: 120 ng/mL (ref 11–307)

## 2019-11-28 MED ORDER — CYANOCOBALAMIN 500 MCG PO TABS
250.0000 ug | ORAL_TABLET | Freq: Every day | ORAL | Status: DC
  Administered 2019-11-28 – 2019-12-02 (×5): 250 ug via ORAL
  Filled 2019-11-28 (×6): qty 1

## 2019-11-28 NOTE — Progress Notes (Signed)
[facility]  PROGRESS NOTE    [name]  HWK:[id number] DOB: Sep 30, 1946 DOA: 11/26/2019 PCP: Karie Schwalbe, MD   Brief Narrative:   this is a 73 year old female who is wheelchair bound secondary to multiple sclerosis.  She presents with complaint of dizziness. She got up this morning, went to use the bathroom, on completing her ADLs she found she could not sit up because she became very dizzy. Her dizziness lasted all day.  She was also nauseous and gagging but did not vomit. She denies any tinnitus. She denies headaches. Her dizziness continues in the ER. She denies any increasing weakness.  She denies any fevers or chills. Her son came over around 7 and brought her to the ER.  11/28/19: Reports feeling better today. Says she is less dizzy.   Assessment & Plan:   Principal Problem:   Multiple sclerosis exacerbation (HCC) Active Problems:   Dyslipidemia   Paraplegia, unspecified (HCC)   Pressure injury of skin  Multiple sclerosis exacerbation Paraplegia, unspecified     - Day 2/3 of solumedrol 1g IV     - Neuro has seen; apprecaite assistance     - PT rec CIR     - symptoms are improved; continue as above  Normocytic anemia     - no evidence of bleed     - check iron studies     - monitor  Mild hypophosphatemia     - encourage diet, monitor  DVT prophylaxis: lovenox Code Status: FULL   Disposition Plan: TBD  Consultants:   Neurology  ROS:  Denies CP, N, V, dyspnea, dizziness, dyspnea . Remainder 10-pt ROS is negative for all not previously mentioned.  Subjective: "I don't know if I would want to go there."  Objective: Vitals:   11/27/19 1618 11/27/19 1909 11/28/19 0334 11/28/19 0737  BP: 123/75 (!) 103/56 (!) 103/55 (!) 114/58  Pulse: 97 84 60 63  Resp: 17 18 18 17   Temp: 98.7 F (37.1 C) 99.2 F (37.3 C) 98.3 F (36.8 C) 98.7 F (37.1 C)  TempSrc: Oral Oral Oral Oral  SpO2: 96% 94% 97% 97%  Weight:      Height:        Intake/Output Summary (Last  24 hours) at 11/28/2019 1430 Last data filed at 11/28/2019 0300 Gross per 24 hour  Intake 0 ml  Output 400 ml  Net -400 ml   Filed Weights   11/26/19 1052 11/27/19 0548  Weight: 51.3 kg 51.3 kg    Examination:  General: 73 y.o. female resting in bed in NAD Cardiovascular: RRR, +S1, S2, no m/g/r, equal pulses throughout Respiratory: CTABL, no w/r/r, normal WOB GI: BS+, NDNT, no masses noted, no organomegaly noted MSK: No e/c/c Neuro: Alert to name, follows commands Psyc: Appropriate interaction and affect, calm/cooperative   Data Reviewed: I have personally reviewed following labs and imaging studies.  CBC: Recent Labs  Lab 11/26/19 1058 11/27/19 0827 11/28/19 0309  WBC 14.3* 5.8 9.4  NEUTROABS 13.2* 5.2 7.2  HGB 13.1 12.8 11.4*  HCT 40.2 38.3 34.1*  MCV 95.3 93.9 93.7  PLT 344 328 331   Basic Metabolic Panel: Recent Labs  Lab 11/26/19 1058 11/27/19 0827 11/28/19 0309  NA 139 143 140  K 3.9 4.0 4.3  CL 107 114* 109  CO2 23 21* 23  GLUCOSE 126* 136* 136*  BUN 13 7* 19  CREATININE 0.60 0.53 0.71  CALCIUM 8.4* 8.8* 8.6*  MG  --   --  2.0  PHOS  --  2.5 2.2*   GFR: Estimated Creatinine Clearance: 50.7 mL/min (by C-G formula based on SCr of 0.71 mg/dL). Liver Function Tests: Recent Labs  Lab 11/26/19 1058 11/27/19 0827 11/28/19 0309  AST 20  --   --   ALT 18  --   --   ALKPHOS 64  --   --   BILITOT 0.8  --   --   PROT 6.1*  --   --   ALBUMIN 3.5 3.1* 2.7*   No results for input(s): LIPASE, AMYLASE in the last 168 hours. No results for input(s): AMMONIA in the last 168 hours. Coagulation Profile: No results for input(s): INR, PROTIME in the last 168 hours. Cardiac Enzymes: No results for input(s): CKTOTAL, CKMB, CKMBINDEX, TROPONINI in the last 168 hours. BNP (last 3 results) No results for input(s): PROBNP in the last 8760 hours. HbA1C: No results for input(s): HGBA1C in the last 72 hours. CBG: No results for input(s): GLUCAP in the last 168  hours. Lipid Profile: No results for input(s): CHOL, HDL, LDLCALC, TRIG, CHOLHDL, LDLDIRECT in the last 72 hours. Thyroid Function Tests: No results for input(s): TSH, T4TOTAL, FREET4, T3FREE, THYROIDAB in the last 72 hours. Anemia Panel: Recent Labs    11/28/19 0814  VITAMINB12 278   Sepsis Labs: No results for input(s): PROCALCITON, LATICACIDVEN in the last 168 hours.  Recent Results (from the past 240 hour(s))  SARS CORONAVIRUS 2 (TAT 6-24 HRS) Nasopharyngeal Nasopharyngeal Swab     Status: None   Collection Time: 11/26/19 11:45 AM   Specimen: Nasopharyngeal Swab  Result Value Ref Range Status   SARS Coronavirus 2 NEGATIVE NEGATIVE Final    Comment: (NOTE) SARS-CoV-2 target nucleic acids are NOT DETECTED. The SARS-CoV-2 RNA is generally detectable in upper and lower respiratory specimens during the acute phase of infection. Negative results do not preclude SARS-CoV-2 infection, do not rule out co-infections with other pathogens, and should not be used as the sole basis for treatment or other patient management decisions. Negative results must be combined with clinical observations, patient history, and epidemiological information. The expected result is Negative. Fact Sheet for Patients: HairSlick.no Fact Sheet for Healthcare Providers: quierodirigir.com This test is not yet approved or cleared by the [place] FDA and  has been authorized for detection and/or diagnosis of SARS-CoV-2 by FDA under an Emergency Use Authorization (EUA). This EUA will remain  in effect (meaning this test can be used) for the duration of the COVID-19 declaration under Section 56 4(b)(1) of the Act, 21 U.S.C. section 360bbb-3(b)(1), unless the authorization is terminated or revoked sooner. Performed at [facility], [address] [place], [place] [address]       Radiology Studies: MR BRAIN WO CONTRAST  Result Date:  11/26/2019 CLINICAL DATA:  Focal neurologic deficit. Suspect stroke. History of multiple sclerosis. EXAM: MRI HEAD WITHOUT CONTRAST TECHNIQUE: Multiplanar, multiecho pulse sequences of the brain and surrounding structures were obtained without intravenous contrast. COMPARISON:  CT head 11/26/2019.  No prior MRI for comparison. FINDINGS: Brain: Negative for acute infarct. Numerous periventricular white matter hyperintensities. Pattern consistent with multiple sclerosis. Hyperintensities extend into the deep white matter in the parietal lobe bilaterally. Brainstem and cerebellum intact. Cerebral volume mildly diminished but normal for age. Negative for hydrocephalus. Negative for hemorrhage or mass. Vascular: Normal arterial flow voids Skull and upper cervical spine: Negative Sinuses/Orbits: Negative Other: None IMPRESSION: Negative for acute infarct Periventricular deep white matter hyperintensities compatible with multiple sclerosis. No prior MRI  for comparison purposes. Electronically Signed   By: Franchot Gallo M.D.   On: 11/26/2019 17:15   MR BRAIN W CONTRAST  Result Date: 11/27/2019 CLINICAL DATA:  Peripheral vertigo EXAM: MRI HEAD WITH CONTRAST TECHNIQUE: Multiplanar, multiecho pulse sequences of the brain and surrounding structures were obtained with intravenous contrast. CONTRAST:  62mL GADAVIST GADOBUTROL 1 MMOL/ML IV SOLN COMPARISON:  Brain MRI 11/26/2019 FINDINGS: There are 2 subtle foci of contrast enhancement within the periventricular white matter. The first is on the right, and the periatrial region (series 12, image 9). The second is on the left, near the superior left lateral ventricle (series 13, image 21). No other abnormal contrast enhancement. IMPRESSION: Two small foci of contrast enhancement consistent with active demyelination within the periventricular white matter. Electronically Signed   By: Ulyses Jarred M.D.   On: 11/27/2019 01:04     Scheduled Meds: . enoxaparin (LOVENOX)  injection  40 mg Subcutaneous Q24H  . methenamine  1 g Oral Daily  . vitamin B-12  250 mcg Oral Daily   Continuous Infusions: . methylPREDNISolone (SOLU-MEDROL) injection 1,000 mg (11/28/19 1009)     LOS: 1 day    Time spent: 25 minutes spent in the coordination of care today.   Jonnie Finner, DO [facility] Pager [phone]  If 7PM-7AM, please contact night-coverage [email or web address] Password [email or web address] 11/28/2019, 2:30 PM

## 2019-11-28 NOTE — Progress Notes (Signed)
Physical Therapy Treatment Patient Details Name: [name] MRN: [id number] DOB: 12-28-1945 Today's Date: 11/28/2019    History of Present Illness Admitted with balance deficits, dizziness, MS exacerbation;  has a past medical history of Ankle fractures, Compression fracture (2011), Disc herniation, MS (multiple sclerosis) ([place]), MVP (mitral valve prolapse) (1979),  Osteoporosis, Rib fractures (1998, 6378,5885), and Toe fracture (1998).    PT Comments    Pt admitted with above diagnosis. Pt was able to tolerate vestibular assessment. Has some oculomotor issues as well left BPPV.  Treated with canalith repositioning maneuver for left posterior canal BPPV.  Left pt resting in bed. Pt was able to sit EOB with min guard assist during vestibular assessment for up to 10 minutes.  Difficulty sitting after the canalith repositioning. Hopeful that treatment will decr symptoms and pt can progress to OOB next treatment.   Pt currently with functional limitations due to balance and endurance deficits. Pt will benefit from skilled PT to increase their independence and safety with mobility to allow discharge to the venue listed below.    Follow Up Recommendations  CIR     Equipment Recommendations  None recommended by PT(quite well-equipped)    Recommendations for Other Services OT consult(ordered per protocol)     Precautions / Restrictions Precautions Precautions: Fall Restrictions Weight Bearing Restrictions: No    Mobility  Bed Mobility Overal bed mobility: Needs Assistance Bed Mobility: Supine to Sit     Supine to sit: Mod assist;+2 for physical assistance     General bed mobility comments: used rails, increased time and effort due to dizziness.   Transfers Overall transfer level: Needs assistance Equipment used: None Transfers: Lateral/Scoot Transfers          Lateral/Scoot Transfers: Mod assist General transfer comment: NT due to canalith repositioning  maneuver  Ambulation/Gait             General Gait Details: Pt does not ambulate at baseline   Stairs             Wheelchair Mobility    Modified Rankin (Stroke Patients Only)       Balance Overall balance assessment: Needs assistance Sitting-balance support: Bilateral upper extremity supported Sitting balance-Leahy Scale: Poor Sitting balance - Comments: demos  posterior pelvic tilt and required mod A to prevent fall backwards sitting EOB with worsening balance initially after the canalith repositioning manuever.  can sit with min guard assist when not dizzy but needed max assist when dizzy.  Postural control: Posterior lean                                  Cognition Arousal/Alertness: Awake/alert Behavior During Therapy: WFL for tasks assessed/performed Overall Cognitive Status: Within Functional Limits for tasks assessed                                        Exercises      General Comments General comments (skin integrity, edema, etc.): Pt tested for all canals for BPPV and tested positive for left BPPV.  Treated with canalith repositioning maneuver.     11/28/19 0001  Symptom Behavior  Type of Dizziness  Blurred vision;Unsteady with head/body turns;Vertigo;Imbalance  Frequency of Dizziness intermittent  Duration of Dizziness seconds  Symptom Nature Positional;Motion provoked;Intermittent  Aggravating Factors Turning body quickly;Turning head quickly;Sitting with head tilted back;Supine  to sit  Relieving Factors Head stationary;Lying supine;Rest;Slow movements  Progression of Symptoms Better  History of similar episodes Has hx of vertigo in family  Oculomotor Exam  Oculomotor Alignment Abnormal  Ocular ROM left eye lags behind  Spontaneous Absent  Gaze-induced  Absent  Head shaking Horizontal Absent  Head Shaking Vertical Absent  Smooth Pursuits Saccades  Saccades Undershoots;Dysmetria  Vestibulo-Ocular Reflex  VOR  1 Head Only (x 1 viewing) No nystagmus or dizziness  Positional Testing  Dix-Hallpike Dix-Hallpike Right;Dix-Hallpike Left  Sidelying Test Sidelying Right;Sidelying Left  Dix-Hallpike Right  Dix-Hallpike Right Symptoms No nystagmus  Dix-Hallpike Left  Dix-Hallpike Left Duration seconds  Dix-Hallpike Left Symptoms Upbeat, left rotatory nystagmus  Sidelying Right  Sidelying Right Symptoms No nystagmus  Sidelying Left  Sidelying Left Symptoms No nystagmus  Cognition  Cognition Orientation Level Oriented x 4  Positional Sensitivities  Supine to Right Side 4  Up from Left Hallpike 3  Rolling Left 3       Pertinent Vitals/Pain Pain Assessment: No/denies pain    Home Living Family/patient expects to be discharged to:: Private residence Living Arrangements: Alone Available Help at Discharge: Family;Other (Comment)(son brings groceries) Type of Home: House Home Access: Ramped entrance   Home Layout: One level Home Equipment: Wheelchair - power;Bedside commode Additional Comments: She has quite a setup that allows her to be independent at wheelchair transfer level    Prior Function Level of Independence: Independent with assistive device(s)      Comments: Independent at power chair transfer level; propels to sink to sponge bathe; mostly microwave meals; her BSC is welded to her bed so she doesn't worry about it sliding   PT Goals (current goals can now be found in the care plan section) Acute Rehab PT Goals Patient Stated Goal: to go home Progress towards PT goals: Progressing toward goals    Frequency    Min 4X/week      PT Plan Current plan remains appropriate    Co-evaluation              AM-PAC PT "6 Clicks" Mobility   Outcome Measure  Help needed turning from your back to your side while in a flat bed without using bedrails?: A Little Help needed moving from lying on your back to sitting on the side of a flat bed without using bedrails?: A Lot Help needed  moving to and from a bed to a chair (including a wheelchair)?: A Lot Help needed standing up from a chair using your arms (e.g., wheelchair or bedside chair)?: Total Help needed to walk in hospital room?: Total Help needed climbing 3-5 steps with a railing? : Total 6 Click Score: 10    End of Session   Activity Tolerance: Patient tolerated treatment well Patient left: in bed;with call bell/phone within reach;with bed alarm set Nurse Communication: Mobility status PT Visit Diagnosis: Other abnormalities of gait and mobility (R26.89);Dizziness and giddiness (R42);BPPV BPPV - Right/Left : Left     Time: 7619-5093 PT Time Calculation (min) (ACUTE ONLY): 27 min  Charges:  $Therapeutic Activity: 8-22 mins $Canalith Rep Proc: 8-22 mins                     Yvett Rossel W,PT Acute Rehabilitation Services Pager:  [phone]  Office:  [phone]     Berline Lopes 11/28/2019, 3:09 PM

## 2019-11-28 NOTE — Evaluation (Signed)
Occupational Therapy Evaluation Patient Details Name: [name] MRN: [id number] DOB: 1946-02-02 Today's Date: 11/28/2019    History of Present Illness Admitted with balance deficits, dizziness, MS exacerbation;  has a past medical history of Ankle fractures, Compression fracture (2011), Disc herniation, MS (multiple sclerosis) ([facility]), MVP (mitral valve prolapse) (1979),  Osteoporosis, Rib fractures (1998, 8676,7209), and Toe fracture (1998).   Clinical Impression   Pt with decline in function and safety with ADLs and ADL mobility with impaired strength, balance and endurance. Pt with hx of MS, but lives at home aline, has a power w/c, BSC and  quite a setup that allows her to be independent at wheelchair transfer level. Pt currently requires min A with bed mobility,  mod A with UB ADLs (poor sitting balance with posterior lean), max - total A with LB ADLs/selfcare, total A with toileting and is able to transfer via lateral scoot. Pt would benefit from acute OT services to address impairments to maximize level of function and safety    Follow Up Recommendations  CIR    Equipment Recommendations  Other (comment)(TBD at next venue of care)    Recommendations for Other Services       Precautions / Restrictions Precautions Precautions: Fall Restrictions Weight Bearing Restrictions: No      Mobility Bed Mobility Overal bed mobility: Needs Assistance Bed Mobility: Supine to Sit     Supine to sit: Min assist     General bed mobility comments: used rails, increased time and effort. Was able to scoot sideways toward rail to [facility] with minn guard A  Transfers Overall transfer level: Needs assistance Equipment used: None Transfers: Lateral/Scoot Transfers          Lateral/Scoot Transfers: Mod assist      Balance Overall balance assessment: Needs assistance Sitting-balance support: Bilateral upper extremity supported Sitting balance-Leahy Scale: Poor Sitting balance -  Comments: demos  posterior pelvic tilt and required min A to prevent fall backwards sitting EOB Postural control: Posterior lean                                 ADL either performed or assessed with clinical judgement   ADL Overall ADL's : Needs assistance/impaired Eating/Feeding: Independent;Sitting;Bed level Eating/Feeding Details (indicate cue type and reason): HOB raised Grooming: Wash/dry hands;Wash/dry face;Min guard;Sitting   Upper Body Bathing: Moderate assistance;Sitting Upper Body Bathing Details (indicate cue type and reason): Poor sitting balance Lower Body Bathing: Maximal assistance   Upper Body Dressing : Moderate assistance   Lower Body Dressing: Total assistance   Toilet Transfer: Moderate assistance Toilet Transfer Details (indicate cue type and reason): simulated by lateral scoot Toileting- Clothing Manipulation and Hygiene: Maximal assistance;Sitting/lateral lean               Vision Patient Visual Report: No change from baseline       Perception     Praxis      Pertinent Vitals/Pain Pain Assessment: No/denies pain     Hand Dominance Right   Extremity/Trunk Assessment Upper Extremity Assessment Upper Extremity Assessment: Generalized weakness   Lower Extremity Assessment Lower Extremity Assessment: Defer to PT evaluation       Communication Communication Communication: No difficulties   Cognition Arousal/Alertness: Awake/alert Behavior During Therapy: WFL for tasks assessed/performed Overall Cognitive Status: Within Functional Limits for tasks assessed  General Comments       Exercises     Shoulder Instructions      Home Living Family/patient expects to be discharged to:: Private residence Living Arrangements: Alone Available Help at Discharge: Family;Other (Comment)(son brings groceries) Type of Home: House Home Access: Ramped entrance     Home Layout: One  level     Bathroom Shower/Tub: Other (comment)(sponge bathes)         Home Equipment: Wheelchair - power;Bedside commode   Additional Comments: She has quite a setup that allows her to be independent at wheelchair transfer level      Prior Functioning/Environment Level of Independence: Independent with assistive device(s)        Comments: Independent at power chair transfer level; propels to sink to sponge bathe; mostly microwave meals; her BSC is welded to her bed so she doesn't worry about it sliding        OT Problem List: Decreased strength;Impaired balance (sitting and/or standing);Impaired tone;Decreased range of motion;Decreased activity tolerance;Decreased coordination      OT Treatment/Interventions: Self-care/ADL training;DME and/or AE instruction;Therapeutic activities;Balance training;Therapeutic exercise;Patient/family education    OT Goals(Current goals can be found in the care plan section) Acute Rehab OT Goals Patient Stated Goal: to go home OT Goal Formulation: With patient Time For Goal Achievement: 12/12/19 Potential to Achieve Goals: Good ADL Goals Pt Will Perform Grooming: with set-up;with supervision;sitting Pt Will Perform Upper Body Bathing: with min assist;sitting Pt Will Perform Lower Body Bathing: with mod assist;sitting/lateral leans Pt Will Perform Upper Body Dressing: with min assist;sitting Pt Will Transfer to Toilet: with min assist;bedside commode(lateral scoot) Additional ADL Goal #1: Pt will complete bed mobility min guard A to sit EOB for ADLs and functiona tassk  OT Frequency: Min 2X/week   Barriers to D/C: Decreased caregiver support          Co-evaluation              AM-PAC OT "6 Clicks" Daily Activity     Outcome Measure Help from another person eating meals?: None Help from another person taking care of personal grooming?: A Little Help from another person toileting, which includes using toliet, bedpan, or urinal?:  Total Help from another person bathing (including washing, rinsing, drying)?: A Lot Help from another person to put on and taking off regular upper body clothing?: A Lot Help from another person to put on and taking off regular lower body clothing?: Total 6 Click Score: 2   End of Session    Activity Tolerance: Patient tolerated treatment well Patient left: in bed;with call bell/phone within reach  OT Visit Diagnosis: Other abnormalities of gait and mobility (R26.89);Muscle weakness (generalized) (M62.81)                Time: 1020-1059 OT Time Calculation (min): 39 min Charges:  OT Evaluation $OT Eval Moderate Complexity: 1 Mod OT Treatments $Self Care/Home Management : 8-22 mins    Britt Bottom 11/28/2019, 1:21 PM

## 2019-11-28 NOTE — Progress Notes (Signed)
Rehab Admissions Coordinator Note:  Per PT recommendation, patient was screened by Michel Santee, PT, DPT for appropriateness for an Inpatient Acute Rehab Consult.  I would like to see how pt progresses with therapy today before determining whether, or not, she meets criteria for a consult order.   Michel Santee, PT, DPT 11/28/2019, 8:48 AM  I can be reached at [phone].

## 2019-11-29 DIAGNOSIS — G35 Multiple sclerosis: Principal | ICD-10-CM

## 2019-11-29 MED ORDER — MECLIZINE HCL 25 MG PO TABS
25.0000 mg | ORAL_TABLET | Freq: Three times a day (TID) | ORAL | Status: DC | PRN
  Administered 2019-12-01: 25 mg via ORAL
  Filled 2019-11-29 (×2): qty 1

## 2019-11-29 MED ORDER — K PHOS MONO-SOD PHOS DI & MONO 155-852-130 MG PO TABS
250.0000 mg | ORAL_TABLET | Freq: Three times a day (TID) | ORAL | Status: AC
  Administered 2019-11-29 – 2019-12-01 (×6): 250 mg via ORAL
  Filled 2019-11-29 (×7): qty 1

## 2019-11-29 NOTE — Consult Note (Signed)
Physical Medicine and Rehabilitation Consult Reason for Consult: Decreased functional mobility Referring Physician: [facility]   HPI: [name] is a 73 y.o. right-handed female with supposed history of multiple sclerosis diagnosed 1982/wheelchair-bound (per pt, also had bad reaction to flu shot prior and also got a "Pan-myelogram" that abruptly afterwards was unable to walk) and last seen by neurology service Dr. Leta Baptist, lumbar disc surgery May 2008 mitral valve prolapse.  Per chart review lives alone.  Her son brings her groceries.  1 level home with ramped entrance.  Reportedly independent wheelchair level.  She propels to sink to sponge bathe and mostly microwaves her meals.  Patient presented 11/27/2019 with increasing dizziness over the past few weeks.  Admission chemistries with urinalysis negative nitrite, WBC 14,300, SARS coronavirus negative.  Cranial CT scan/MRI showed negative for acute infarct.  Periventricular deep white matter hyperintensities compatible multiple sclerosis.  There were no prior MRIs for comparison purposes.  Neurology follow-up placed on Solu-Medrol 1 g x 3 days for suspected MS exacerbation.  Subcutaneous Lovenox for DVT prophylaxis.  Tolerating a regular diet.  Therapy evaluation completed with recommendations of physical medicine rehab consult.   Pt feels the only reason she needs to come to rehab  Is to "get rid of the dizziness"- she doesn't want to come if either we try to send her to nursing home, say she can't d/c home on her own, or cannot "fix" her dizziness. If we think we CAN fix her dizziness, she's willing to come, but only with those concerns in mind. Says she denies constipation- goes "regularly"- Usually gets UTI ~ 1x/year- on Methamine or something like that o prevent UTIs.  Slides over for transfer without SB- but uses SB for car transfers lviing by self for 10-12 years minimum in w/c- has power w/c-  Said not sure if the vestibular  therapy they did yesterday was helpful, however less vertigo today. Also notes vision blurry close up and really far away- hasn't seen eye doctor since 1970s.    Review of Systems  Constitutional: Negative for chills and fever.  HENT: Negative for hearing loss.   Eyes: Negative for blurred vision and double vision.  Respiratory: Negative for cough and shortness of breath.   Cardiovascular: Positive for leg swelling. Negative for chest pain and palpitations.  Gastrointestinal: Positive for constipation. Negative for heartburn, nausea and vomiting.  Genitourinary:       Neurogenic bladder as well as bowel  Skin: Negative for rash.  Neurological: Positive for dizziness and weakness.       Spasticity  All other systems reviewed and are negative.  Past Medical History:  Diagnosis Date  . Ankle fracture, left 07/2006  . Ankle fracture, right 10/30/06   [facility]  . Compression fracture 2011   Dr. Mardelle Matte   . Disc herniation    L5, S1  . MS (multiple sclerosis) ([place])    evaluated by Dr. Erling Cruz  . MVP (mitral valve prolapse) 1979   ECHO. Trace MR, trace TR, trace PR 07/17/03  . Nephrolithiasis   . Osteoporosis   . Rib fractures 1998, [id number]   2  . Toe fracture 1998   5   Past Surgical History:  Procedure Laterality Date  . LUMBAR DISC SURGERY  04/2007   L5, S1- [facility]  . SEPTOPLASTY  1983   deviated septum  . TONSILLECTOMY AND ADENOIDECTOMY  1960  . TOTAL ABDOMINAL HYSTERECTOMY W/ BILATERAL SALPINGOOPHORECTOMY  1982   for endometriosis  Family History  Problem Relation Age of Onset  . Stroke Mother   . Neuropathy Brother   . Stroke Father   . Hypertension Neg Hx   . Diabetes Neg Hx   . Depression Neg Hx   . Alcohol abuse Neg Hx   . Drug abuse Neg Hx    Social History:  reports that she has never smoked. She has never used smokeless tobacco. She reports that she does not drink alcohol or use drugs. Allergies:  Allergies  Allergen Reactions  . Procaine Hcl     REACTION:  UNSPECIFIED   Medications Prior to Admission  Medication Sig Dispense Refill  . methenamine (HIPREX) 1 g tablet Take 1 g by mouth daily.      Home: Home Living Family/patient expects to be discharged to:: Private residence Living Arrangements: Alone Available Help at Discharge: Family, Other (Comment)(son brings groceries) Type of Home: House Home Access: Ramped entrance Home Layout: One level Bathroom Shower/Tub: Other (comment)(sponge bathes) Home Equipment: Wheelchair - power, Bedside commode Additional Comments: She has quite a setup that allows her to be independent at wheelchair transfer level  Functional History: Prior Function Level of Independence: Independent with assistive device(s) Comments: Independent at power chair transfer level; propels to sink to sponge bathe; mostly microwave meals; her BSC is welded to her bed so she doesn't worry about it sliding Functional Status:  Mobility: Bed Mobility Overal bed mobility: Needs Assistance Bed Mobility: Supine to Sit Supine to sit: Mod assist, +2 for physical assistance General bed mobility comments: used rails, increased time and effort due to dizziness.  Transfers Overall transfer level: Needs assistance Equipment used: None Transfers: Counselling psychologistAnterior-Posterior Transfer Anterior-Posterior transfers: Total assist, +2 physical assistance  Lateral/Scoot Transfers: Mod assist General transfer comment: Pt states she does not put weight on LEs and scoots to chair and pt does not have a drop arm chair therefore performed Anterior posterior transfer into chair today so that pt could sit up.  Ambulation/Gait General Gait Details: Pt does not ambulate at baseline    ADL: ADL Overall ADL's : Needs assistance/impaired Eating/Feeding: Independent, Sitting, Bed level Eating/Feeding Details (indicate cue type and reason): HOB raised Grooming: Wash/dry hands, Wash/dry face, Min guard, Sitting Upper Body Bathing: Moderate assistance,  Sitting Upper Body Bathing Details (indicate cue type and reason): Poor sitting balance Lower Body Bathing: Maximal assistance Upper Body Dressing : Moderate assistance Lower Body Dressing: Total assistance Toilet Transfer: Moderate assistance Toilet Transfer Details (indicate cue type and reason): simulated by lateral scoot Toileting- Clothing Manipulation and Hygiene: Maximal assistance, Sitting/lateral lean  Cognition: Cognition Overall Cognitive Status: Within Functional Limits for tasks assessed Orientation Level: Oriented X4 Cognition Arousal/Alertness: Awake/alert Behavior During Therapy: WFL for tasks assessed/performed Overall Cognitive Status: Within Functional Limits for tasks assessed  Blood pressure 129/66, pulse (!) 59, temperature 97.8 F (36.6 C), temperature source Oral, resp. rate 15, height 5\' 3"  (1.6 m), weight 51.3 kg, SpO2 99 %. Physical Exam  Nursing note and vitals reviewed. Constitutional: She appears well-developed and well-nourished.  Sitting up in bed in chair at bedside; small elderly female but with big spirit, has purewick, NAD  Notes that she needs to go to bathroom at home every 1 hour- and literally transfers to bed/BSC q 1 HOUR.   HENT:  Head: Normocephalic and atraumatic.  Mouth/Throat: No oropharyngeal exudate.  No facial asymmetry and facial sensation intact  Eyes: Conjunctivae and EOM are normal. No scleral icterus.  Grossly, no nystagmus, however didn't move her around to  trigger it  Neck: No tracheal deviation present.  Cardiovascular:  RRR  Respiratory:  CTA B/L no W/R/R  GI:  Soft, NT, ND,  hypoactive BS  Genitourinary:    Genitourinary Comments: Has purewick with dark amber urine in container.    Musculoskeletal:     Cervical back: Normal range of motion and neck supple.     Comments: UEs at least 4+/5 in deltoids, biceps, triceps, WE, grip and finger abd B/L  LEs- RLE HF- 2-/5, KE 1/5- otherwise was not able to see ANY movement  in LEs B/L- and pt attempted for significant period of time, per her.   Neurological:  Patient is alert in no acute distress oriented x3.  Cooperative with exam.  Follows commands.  Fair medical historian. Foot drop B/L No significant spasticity seen in LEs B/L- which doesn't match most MS patients  Skin: Skin is warm and dry.  IV in R forearm  Psychiatric: She has a normal mood and affect.    No results found for this or any previous visit (from the past 24 hour(s)). No results found.     Assessment/Plan: Diagnosis: severe vertigo in w/c bound patient 1. Does the need for close, 24 hr/day medical supervision in concert with the patient's rehab needs make it unreasonable for this patient to be served in a less intensive setting? Potentially 2. Co-Morbidities requiring supervision/potential complications: w/c bound- questionable MS as cause, no neurogenic bowel and bladder per pt; mild constipation, otolith needs repositioning, voids q1 hour Due to bladder management, safety, skin/wound care, disease management and patient education, does the patient require 24 hr/day rehab nursing? Potentially 3. Does the patient require coordinated care of a physician, rehab nurse, therapy disciplines of PT and OT to address physical and functional deficits in the context of the above medical diagnosis(es)? Yes Addressing deficits in the following areas: strength, transferring, bowel/bladder control, bathing, dressing, grooming and toileting 4. Can the patient actively participate in an intensive therapy program of at least 3 hrs of therapy per day at least 5 days per week? Yes 5. The potential for patient to make measurable gains while on inpatient rehab is good 6. Anticipated functional outcomes upon discharge from inpatient rehab are modified independent  with PT, modified independent with OT, n/a with SLP. 7. Estimated rehab length of stay to reach the above functional goals is: 5-7 days 8. Anticipated  discharge destination: Home 9. Overall Rehab/Functional Prognosis: good  RECOMMENDATIONS: This patient's condition is appropriate for continued rehabilitative care in the following setting: CIR vs home Patient has agreed to participate in recommended program. Potentially Note that insurance prior authorization may be required for reimbursement for recommended care.  Comment:  1. Patient is EMPHATIC that if we cannot help her dizziness/vertigo in rehab, she doesn't want to come to CIR and wants the option to be discharged when [facility] thinks she's ready, not before or after that. I explained the rules and regs of rehab- she wasn't happy with that- will have to come to compromise if she comes to rehab- she's THINKING about it at this time.  2. Suggest having vestibular PT reassess pt and attempt to fix otoliths- pt might be able to go home from acute- she's EMPHATIC that's if she was at home, she's at the level of function that she could do everything herself, just needs her own set-up.  Also, she doesn't want to leave the hospital until "Fixed" since she rarely uses medical care, per pt.   Spent a total  of 50 minutes with pt in direct care, in addition to type up consult.  Mcarthur RossettiDaniel J Angiulli, PA-C 11/29/2019

## 2019-11-29 NOTE — Progress Notes (Deleted)
NEUROLOGY PROGRESS NOTE  Subjective: Patient states that her vertigo is 85% better.  She notes that the vertigo is brought on by movements and does stop when she is not moving.  Currently she is not having any vertigo.  Patient does state that she felt worse vertigo yesterday then she does today.   Exam: Vitals:   11/29/19 0416 11/29/19 0800  BP: 112/65 129/66  Pulse: 60 (!) 59  Resp:  15  Temp: 98.4 F (36.9 C) 97.8 F (36.6 C)  SpO2: 96% 99%    ROS General ROS: negative for - chills, fatigue, fever, night sweats, weight gain or weight loss Psychological ROS: negative for - behavioral disorder, hallucinations, memory difficulties, mood swings or suicidal ideation Ophthalmic ROS: negative for - blurry vision, double vision, eye pain or loss of vision ENT ROS: Positive for -  vertigo Respiratory ROS: negative for - cough, hemoptysis, shortness of breath or wheezing Cardiovascular ROS: negative for - chest pain, dyspnea on exertion, edema or irregular heartbeat Gastrointestinal ROS: negative for - abdominal pain, diarrhea, hematemesis, nausea/vomiting or stool incontinence Genito-Urinary ROS: negative for - dysuria, hematuria, incontinence or urinary frequency/urgency Musculoskeletal ROS: Positive for - muscular weakness Neurological ROS: as noted in HPI Dermatological ROS: negative for rash and skin lesion changes        Physical Exam  Constitutional: Appears well-developed and well-nourished.  Psych: Affect appropriate to situation Eyes: No scleral injection HENT: No OP obstrucion Head: Normocephalic.  Cardiovascular: Normal rate and regular rhythm.  Respiratory: Effort normal, non-labored breathing GI: Soft.  No distension. There is no tenderness.  Skin: WDI   Neuro:  Mental Status: Alert, oriented, thought content appropriate.  Speech fluent without evidence of aphasia.  Able to follow 3 step commands without difficulty. Cranial Nerves: II:  Visual fields grossly  normal,  III,IV, VI: ptosis not present, extra-ocular motions intact with saccadic movements when looking left and right, bilaterally pupils equal, round, reactive to light and accommodation V,VII: smile symmetric, facial light touch sensation normal bilaterally VIII: hearing normal bilaterally  Motor: Right : Upper extremity   5/5    Left:     Upper extremity   5/5  Lower extremity   2/5     Lower extremity   1/5 -Titubation the patient has had Tone and bulk:normal tone throughout; no atrophy noted Sensory: Pinprick and light touch intact throughout, bilaterally Deep Tendon Reflexes: 2+ and symmetric throughout Cerebellar: normal finger-to-nose with action tremor most notable at endpoint,     Medications:  Scheduled: . enoxaparin (LOVENOX) injection  40 mg Subcutaneous Q24H  . methenamine  1 g Oral Daily  . vitamin B-12  250 mcg Oral Daily   Continuous: . methylPREDNISolone (SOLU-MEDROL) injection 1,000 mg (11/29/19 0951)   ION:[id number] **OR** acetaminophen, meclizine, ondansetron **OR** ondansetron (ZOFRAN) IV, polyethylene glycol  Pertinent Labs/Diagnostics:  None    Assessment:  73 year old female with subacute vertigo in the setting of MS.  As noted prior location of lesions is not typical to cause vertigo.  Patient at this point describing vertigo with head motion only.  On exam from looking at previous note patient's nystagmus has improved significantly.  Is still unclear another possibility would be baseline central finding superimposed on peripheral vertigo.  Given that her vertigo is significantly improved and is only with movement at this point there is a good possibility it is peripheral vertigo superimposed on central findings.    Impression: -Small enhancing lesions in the periventricular white matter consistent with MS -Vertigo which  is brought on by motion but resolves when not moving. -Prolonged history of lower extremity weakness bilaterally and  dysmetria.  Since 2014 when she saw Dr. Marjory Lies she was wheelchair-bound at that time.  Recommendations: -At this point today would be the last dose of Solu-Medrol -Physical therapy -Patient has not seen a neurologist in multiple years.  Her last visit was Dr. Marjory Lies in 2014.  Patient will need a follow-up for her MS.  It would be best for her to follow-up with Dr. Epimenio Foot of [facility] neurology as he is a MS specialist.  Neurology will S/O  Felicie Morn PA-C [facility] [phone]  11/29/2019, 11:34 AM

## 2019-11-29 NOTE — Progress Notes (Signed)
NEUROLOGY PROGRESS NOTE  Subjective: Patient states that her vertigo is 85% better.  She notes that the vertigo is brought on by movements and does stop when she is not moving.  Currently she is not having any vertigo.  Patient does state that she felt worse vertigo yesterday then she does today.   Exam: Vitals:   11/29/19 0416 11/29/19 0800  BP: 112/65 129/66  Pulse: 60 (!) 59  Resp:  15  Temp: 98.4 F (36.9 C) 97.8 F (36.6 C)  SpO2: 96% 99%    ROS General ROS: negative for - chills, fatigue, fever, night sweats, weight gain or weight loss Psychological ROS: negative for - behavioral disorder, hallucinations, memory difficulties, mood swings or suicidal ideation Ophthalmic ROS: negative for - blurry vision, double vision, eye pain or loss of vision ENT ROS: Positive for -  vertigo Respiratory ROS: negative for - cough, hemoptysis, shortness of breath or wheezing Cardiovascular ROS: negative for - chest pain, dyspnea on exertion, edema or irregular heartbeat Gastrointestinal ROS: negative for - abdominal pain, diarrhea, hematemesis, nausea/vomiting or stool incontinence Genito-Urinary ROS: negative for - dysuria, hematuria, incontinence or urinary frequency/urgency Musculoskeletal ROS: Positive for - muscular weakness Neurological ROS: as noted in HPI Dermatological ROS: negative for rash and skin lesion changes        Physical Exam  Constitutional: Appears well-developed and well-nourished.  Psych: Affect appropriate to situation Eyes: No scleral injection HENT: No OP obstrucion Head: Normocephalic.  Cardiovascular: Normal rate and regular rhythm.  Respiratory: Effort normal, non-labored breathing GI: Soft.  No distension. There is no tenderness.  Skin: WDI   Neuro:  Mental Status: Alert, oriented, thought content appropriate.  Speech fluent without evidence of aphasia.  Able to follow 3 step commands without difficulty. Cranial Nerves: II:  Visual fields grossly  normal,  III,IV, VI: ptosis not present, extra-ocular motions intact with saccadic movements when looking left and right, bilaterally pupils equal, round, reactive to light and accommodation V,VII: smile symmetric, facial light touch sensation normal bilaterally VIII: hearing normal bilaterally  Motor: Right : Upper extremity   5/5    Left:     Upper extremity   5/5  Lower extremity   2/5     Lower extremity   1/5 -Titubation the patient has had Tone and bulk:normal tone throughout; no atrophy noted Sensory: Pinprick and light touch intact throughout, bilaterally Deep Tendon Reflexes: 2+ and symmetric throughout Cerebellar: normal finger-to-nose with action tremor most notable at endpoint,     Medications:  Scheduled: . enoxaparin (LOVENOX) injection  40 mg Subcutaneous Q24H  . methenamine  1 g Oral Daily  . vitamin B-12  250 mcg Oral Daily   Continuous: . methylPREDNISolone (SOLU-MEDROL) injection 1,000 mg (11/29/19 0951)   ION:[id number] **OR** acetaminophen, meclizine, ondansetron **OR** ondansetron (ZOFRAN) IV, polyethylene glycol  Pertinent Labs/Diagnostics:  None    Assessment:  73 year old female with subacute vertigo in the setting of MS.  As noted prior location of lesions is not typical to cause vertigo.  Patient at this point describing vertigo with head motion only.  On exam from looking at previous note patient's nystagmus has improved significantly.  Is still unclear another possibility would be baseline central finding superimposed on peripheral vertigo.  Given that her vertigo is significantly improved and is only with movement at this point there is a good possibility it is peripheral vertigo superimposed on central findings.    Impression: -Small enhancing lesions in the periventricular white matter consistent with MS -Vertigo which  is brought on by motion but resolves when not moving. -Prolonged history of lower extremity weakness bilaterally and  dysmetria.  Since 2014 when she saw Dr. Marjory Lies she was wheelchair-bound at that time.  Recommendations: -At this point today would be the last dose of Solu-Medrol -Physical therapy -Patient has not seen a neurologist in multiple years.  Her last visit was Dr. Marjory Lies in 2014.  Patient will need a follow-up for her MS.  It would be best for her to follow-up with Dr. Epimenio Foot of [facility] neurology as he is a MS specialist.  Neurology will S/O  Felicie Morn PA-C [facility] [phone]  11/29/2019, 11:34 AM

## 2019-11-29 NOTE — Progress Notes (Addendum)
PROGRESS NOTE    Arsenio LoaderDenzel D Braun  ZOX:[id number] DOB: 02-08-1946 DOA: 11/26/2019 PCP: Karie SchwalbeLetvak, Richard I, MD   Brief Narrative: 73 year old female with past medical history of multiple sclerosis wheelchair-bound, dyslipidemia, anemia presented with complaint of dizziness.  This patient got up in the morning went to the bathroom on completing her ADLs found she could not sit up because she became very dizzy, lasting all day came to the ER also was complaining of nausea and gagging but did not vomit.  subacute vertigo in the setting of MS. Haze JustinLujan was admitted, MRI brain showed 2 small foci of contrast-enhancement consistent with active demyelination within the periventricular white matter. Patient is scheduled to have 3 doses of solumedrol 1gm  Subjective:  On bedside chair, on ra, c/o vertigo/dizziness when moving her head up and down. reports she talked to her regular doctor this am Interested in CIR  Assessment & Plan:   Acute Multiple sclerosis exacerbation: Imaging showed a small enhancing lesions in the periventricular white matter consistent with MS. seen by neurology will be getting her last dose of Solu-Medrol today 3/3 doses.  Neurology input appreciated. Patient is not seen by a neurologist in multiple years and advised to follow-up outpatient-we will need follow-up with Dr. Epimenio FootSater of [facility].  Hypophosphatemia we will replete with K-Phos.  Dizziness/vertigo: Vertigo brought on by motion but resolves when not moving.  Added meclizine.  Anemia of chronic disease: hemoglobin appears stable at 8.4 g.  Ferritin level normal, B12- 278  Dyslipidemia-not on meds  Paraplegia, unspecified -continue PT OT looking into CIR placement  Pressure Ulcer:POA Pressure Injury 11/27/19 Sacrum Right;Posterior Stage I -  Intact skin with non-blanchable redness of a localized area usually over a bony prominence. (Active)  11/27/19 0550  Location: Sacrum  Location Orientation: Right;Posterior    Staging: Stage I -  Intact skin with non-blanchable redness of a localized area usually over a bony prominence.  Wound Description (Comments):   Present on Admission: Yes    Body mass index is 20.03 kg/m.    DVT prophylaxis:lovenox Code Status:FULL  Family Communication: plan of care discussed with patient at bedside.  Declined request to call her son- reports he comes in afternoon and is aware.  Disposition Plan: Remains inpatient, plan on CIR   Consultants: NEUROLOGY Procedures: MRI Brain: Two small foci of contrast enhancement consistent with active demyelination within the periventricular white matter.  Microbiology:NONE  Antimicrobials: Anti-infectives (From admission, onward)    Start     Dose/Rate Route Frequency Ordered Stop   11/27/19 1000  methenamine (MANDELAMINE) tablet 1 g     1 g Oral Daily 11/27/19 0545          Objective: Vitals:   11/28/19 1558 11/28/19 2026 11/29/19 0416 11/29/19 0800  BP: 130/66 125/74 112/65 129/66  Pulse: 69 68 60 (!) 59  Resp: 17   15  Temp: 98.4 F (36.9 C) 98.6 F (37 C) 98.4 F (36.9 C) 97.8 F (36.6 C)  TempSrc: Oral Oral Oral Oral  SpO2: 95% 94% 96% 99%  Weight:      Height:        Intake/Output Summary (Last 24 hours) at 11/29/2019 1033 Last data filed at 11/29/2019 0900 Gross per 24 hour  Intake 100 ml  Output 1200 ml  Net -1100 ml   Filed Weights   11/26/19 1052 11/27/19 0548  Weight: 51.3 kg 51.3 kg   Weight change:   Body mass index is 20.03 kg/m.  Intake/Output from previous day:  12/14 0701 - 12/15 0700 In: -  Out: 1200 [Urine:1200] Intake/Output this shift: Total I/O In: 100 [P.O.:100] Out: -   Examination:  General exam: AAOx3, RA, NAD, Weak appearing. HEENT:Oral mucosa moist, Ear/Nose WNL grossly, dentition normal. Respiratory system: Diminished at the base,no wheezing or crackles,no use of accessory muscle Cardiovascular system: S1 & S2 +, No JVD,. Gastrointestinal system: Abdomen  soft, NT,ND, BS+ Nervous System:Alert, awake, moving extremities and grossly nonfocal Extremities: No edema, distal peripheral pulses palpable.  Skin: No rashes,no icterus. MSK: Normal muscle bulk,tone, power  Medications:  Scheduled Meds:  enoxaparin (LOVENOX) injection  40 mg Subcutaneous Q24H   methenamine  1 g Oral Daily   vitamin B-12  250 mcg Oral Daily   Continuous Infusions:  methylPREDNISolone (SOLU-MEDROL) injection 1,000 mg (11/29/19 0951)    Data Reviewed: I have personally reviewed following labs and imaging studies  CBC: Recent Labs  Lab 11/26/19 1058 11/27/19 0827 11/28/19 0309  WBC 14.3* 5.8 9.4  NEUTROABS 13.2* 5.2 7.2  HGB 13.1 12.8 11.4*  HCT 40.2 38.3 34.1*  MCV 95.3 93.9 93.7  PLT 344 328 127   Basic Metabolic Panel: Recent Labs  Lab 11/26/19 1058 11/27/19 0827 11/28/19 0309  NA 139 143 140  K 3.9 4.0 4.3  CL 107 114* 109  CO2 23 21* 23  GLUCOSE 126* 136* 136*  BUN 13 7* 19  CREATININE 0.60 0.53 0.71  CALCIUM 8.4* 8.8* 8.6*  MG  --   --  2.0  PHOS  --  2.5 2.2*   GFR: Estimated Creatinine Clearance: 50.7 mL/min (by C-G formula based on SCr of 0.71 mg/dL). Liver Function Tests: Recent Labs  Lab 11/26/19 1058 11/27/19 0827 11/28/19 0309  AST 20  --   --   ALT 18  --   --   ALKPHOS 64  --   --   BILITOT 0.8  --   --   PROT 6.1*  --   --   ALBUMIN 3.5 3.1* 2.7*   No results for input(s): LIPASE, AMYLASE in the last 168 hours. No results for input(s): AMMONIA in the last 168 hours. Coagulation Profile: No results for input(s): INR, PROTIME in the last 168 hours. Cardiac Enzymes: No results for input(s): CKTOTAL, CKMB, CKMBINDEX, TROPONINI in the last 168 hours. BNP (last 3 results) No results for input(s): PROBNP in the last 8760 hours. HbA1C: No results for input(s): HGBA1C in the last 72 hours. CBG: No results for input(s): GLUCAP in the last 168 hours. Lipid Profile: No results for input(s): CHOL, HDL, LDLCALC, TRIG,  CHOLHDL, LDLDIRECT in the last 72 hours. Thyroid Function Tests: No results for input(s): TSH, T4TOTAL, FREET4, T3FREE, THYROIDAB in the last 72 hours. Anemia Panel: Recent Labs    11/28/19 0814  VITAMINB12 278  FERRITIN 120  TIBC 255  IRON 130   Sepsis Labs: No results for input(s): PROCALCITON, LATICACIDVEN in the last 168 hours.  Recent Results (from the past 240 hour(s))  SARS CORONAVIRUS 2 (TAT 6-24 HRS) Nasopharyngeal Nasopharyngeal Swab     Status: None   Collection Time: 11/26/19 11:45 AM   Specimen: Nasopharyngeal Swab  Result Value Ref Range Status   SARS Coronavirus 2 NEGATIVE NEGATIVE Final    Comment: (NOTE) SARS-CoV-2 target nucleic acids are NOT DETECTED. The SARS-CoV-2 RNA is generally detectable in upper and lower respiratory specimens during the acute phase of infection. Negative results do not preclude SARS-CoV-2 infection, do not rule out co-infections with other pathogens, and should not be  used as the sole basis for treatment or other patient management decisions. Negative results must be combined with clinical observations, patient history, and epidemiological information. The expected result is Negative. Fact Sheet for Patients: HairSlick.no Fact Sheet for Healthcare Providers: quierodirigir.com This test is not yet approved or cleared by the [place] FDA and  has been authorized for detection and/or diagnosis of SARS-CoV-2 by FDA under an Emergency Use Authorization (EUA). This EUA will remain  in effect (meaning this test can be used) for the duration of the COVID-19 declaration under Section 56 4(b)(1) of the Act, 21 U.S.C. section 360bbb-3(b)(1), unless the authorization is terminated or revoked sooner. Performed at [facility], [address], [place], [place] [address]       Radiology Studies: No results found.    LOS: 2 days   Time spent: More than 50% of that time was  spent in counseling and/or coordination of care.  Lanae Boast, MD [facility]  11/29/2019, 10:33 AM

## 2019-11-29 NOTE — Progress Notes (Signed)
Inpatient Rehab Admissions Coordinator:   At this time we are recommending a CIR consult.  I will place the order.   Shann Medal, PT, DPT Admissions Coordinator [phone] 11/29/19  10:38 AM

## 2019-11-29 NOTE — Progress Notes (Signed)
Pt stable. No c/o pain or nausea throughout the night. Will continue to monitor.

## 2019-11-29 NOTE — Plan of Care (Signed)

## 2019-11-29 NOTE — Progress Notes (Signed)
Physical Therapy Treatment Patient Details Name: [name] MRN: [id number] DOB: 28-Apr-1946 Today's Date: 11/29/2019    History of Present Illness Admitted with balance deficits, dizziness, MS exacerbation;  has a past medical history of Ankle fractures, Compression fracture (2011), Disc herniation, MS (multiple sclerosis) ([facility]), MVP (mitral valve prolapse) (1979),  Osteoporosis, Rib fractures (1998, 8841,6606), and Toe fracture (1998).    PT Comments    Pt admitted with above diagnosis. Overall pt was better today with less dizziness reported.  Checked all canals again and pt more symptomatic when turning to right but pt did not have nystagmus.  Went ahead and treated for right BPPV. Pt very dizzy at end of the canalith repositioning maneuver. Did note saccadic eye movements of left eye as well as pt with direction changing nystagmus with gaze holding.  This PT does feel that pt had some BPPV however may have other reasons for her dizziness as she has other symptoms with her vision and eye movements.   Pt needed total assist of 2 for anterior posterior transfer into recliner. Will continue to progress pt as able.  Pt currently with functional limitations due to balance and endurance deficits. Pt would really benefit from CIR as she cannot go home at current state.  She is very motivated to get better and return to prior functional level. Pt will benefit from skilled PT to increase their independence and safety with mobility to allow discharge to the venue listed below.     Follow Up Recommendations  CIR     Equipment Recommendations  None recommended by PT(quite well-equipped)    Recommendations for Other Services OT consult(ordered per protocol)     Precautions / Restrictions Precautions Precautions: Fall Restrictions Weight Bearing Restrictions: No    Mobility  Bed Mobility Overal bed mobility: Needs Assistance Bed Mobility: Supine to Sit     Supine to sit: Mod assist;+2  for physical assistance     General bed mobility comments: used rails, increased time and effort due to dizziness.   Transfers Overall transfer level: Needs assistance Equipment used: None Transfers: Comptroller transfers: Total assist;+2 physical assistance   General transfer comment: Pt states she does not put weight on LEs and scoots to chair and pt does not have a drop arm chair therefore performed Anterior posterior transfer into chair today so that pt could sit up.   Ambulation/Gait             General Gait Details: Pt does not ambulate at baseline   Stairs             Wheelchair Mobility    Modified Rankin (Stroke Patients Only)       Balance Overall balance assessment: Needs assistance Sitting-balance support: Bilateral upper extremity supported Sitting balance-Leahy Scale: Poor Sitting balance - Comments: demos  posterior pelvic tilt and required mod A to prevent fall backwards sitting EOB with worsening balance initially after the canalith repositioning manuever.  can sit with min guard assist when not dizzy but needed max assist when dizzy.  Postural control: Posterior lean                                  Cognition Arousal/Alertness: Awake/alert Behavior During Therapy: WFL for tasks assessed/performed Overall Cognitive Status: Within Functional Limits for tasks assessed  Exercises      General Comments General comments (skin integrity, edema, etc.): Pt tested for all canals for BPPV and difficult to assess as pt dizzy with right hallpike but could not see nystagmus.   Treated with canalith repositioning maneuver for right BPPV just to be sure that she did not have that side as well as pt stated after treatment yesterdya that she is no longer dizzy when rolling left but she was when rolling right.  Upon sitting, noted pt with saccadic eye  movement of left eye as well as it appeared she had direction changing nystagmus with gaze holding.  Pt does appear to have had BPPV but also possibly more going on.        Pertinent Vitals/Pain Pain Assessment: No/denies pain    Home Living                      Prior Function            PT Goals (current goals can now be found in the care plan section) Acute Rehab PT Goals Patient Stated Goal: to go home Progress towards PT goals: Progressing toward goals    Frequency    Min 4X/week      PT Plan Current plan remains appropriate    Co-evaluation              AM-PAC PT "6 Clicks" Mobility   Outcome Measure  Help needed turning from your back to your side while in a flat bed without using bedrails?: A Lot Help needed moving from lying on your back to sitting on the side of a flat bed without using bedrails?: A Lot Help needed moving to and from a bed to a chair (including a wheelchair)?: Total Help needed standing up from a chair using your arms (e.g., wheelchair or bedside chair)?: Total Help needed to walk in hospital room?: Total Help needed climbing 3-5 steps with a railing? : Total 6 Click Score: 8    End of Session   Activity Tolerance: Patient tolerated treatment well;Patient limited by fatigue Patient left: with call bell/phone within reach;in chair;with chair alarm set Nurse Communication: Mobility status;Need for lift equipment(use Maxi move to get pt back to bed) PT Visit Diagnosis: Other abnormalities of gait and mobility (R26.89);Dizziness and giddiness (R42);BPPV BPPV - Right/Left : Left     Time: 2683-4196 PT Time Calculation (min) (ACUTE ONLY): 20 min  Charges:  $Therapeutic Activity: 8-22 mins                     Blanche Gallien W,PT Acute Rehabilitation Services Pager:  [phone]  Office:  [phone]     Berline Lopes 11/29/2019, 10:32 AM

## 2019-11-30 NOTE — H&P (Signed)
Physical Medicine and Rehabilitation Admission H&P    Chief Complaint  Patient presents with  . Dizziness  . Weakness  : HPI: [name] is a 73 year old right-handed female with history of multiple sclerosis diagnosed 1982 and was last seen by neurology services Dr. Levada Dy in 2014, lumbar disc surgery, mitral valve prolapse.  History taken from chart review and patient.  Patient is maintained only on Mandelamine for recurrent UTIs.  Per chart review patient lives alone.  Her son brings her groceries.  She is essentially wheelchair-bound.  1 level home with ramped entrance.  She propels to sink to sponge bathe and mostly microwaves her meals.  Presented on 11/27/2019 with creasing dizziness.  Admission chemistries with urinalysis negative nitrite, WBC 14,300, SARS coronavirus negative.  Cranial CT scan and MRI were unremarkable for acute intracranial process.  Periventricular deep white matter hyperintensities compatible with multiple sclerosis.  There were no prior MRIs for comparison purposes.  Neurology follow-up placed on Solu-Medrol 1 g x 3 days for suspected MS exacerbation.  Subcutaneous Lovenox for DVT prophylaxis.  Tolerating regular diet.  Patient was admitted for a comprehensive rehab program.  Please see preadmission assessment from earlier today as well.  Review of Systems  Constitutional: Negative for chills and fever.  HENT: Negative for hearing loss.   Eyes: Negative for blurred vision and double vision.  Respiratory: Negative for cough and shortness of breath.   Cardiovascular: Negative for chest pain and palpitations.  Gastrointestinal: Positive for constipation. Negative for heartburn, nausea and vomiting.  Genitourinary: Negative for dysuria and hematuria.       Reported stress incontinence with urgency  Musculoskeletal: Positive for myalgias.  Skin: Negative for rash.  Neurological: Positive for dizziness, focal weakness and weakness.       Spasticity    Past Medical History:  Diagnosis Date  . Ankle fracture, left 07/2006  . Ankle fracture, right 10/30/06   [facility]  . Compression fracture 2011   Dr. Dion Saucier   . Disc herniation    L5, S1  . MS (multiple sclerosis) ([facility])    evaluated by Dr. Sandria Manly  . MVP (mitral valve prolapse) 1979   ECHO. Trace MR, trace TR, trace PR 07/17/03  . Nephrolithiasis   . Osteoporosis   . Rib fractures 1998, [id number]   2  . Toe fracture 1998   5   Past Surgical History:  Procedure Laterality Date  . LUMBAR DISC SURGERY  04/2007   L5, S1- [facility]  . SEPTOPLASTY  1983   deviated septum  . TONSILLECTOMY AND ADENOIDECTOMY  1960  . TOTAL ABDOMINAL HYSTERECTOMY W/ BILATERAL SALPINGOOPHORECTOMY  1982   for endometriosis   Family History  Problem Relation Age of Onset  . Stroke Mother   . Neuropathy Brother   . Stroke Father   . Hypertension Neg Hx   . Diabetes Neg Hx   . Depression Neg Hx   . Alcohol abuse Neg Hx   . Drug abuse Neg Hx    Social History:  reports that she has never smoked. She has never used smokeless tobacco. She reports that she does not drink alcohol or use drugs. Allergies:  Allergies  Allergen Reactions  . Procaine Hcl     REACTION: UNSPECIFIED   Medications Prior to Admission  Medication Sig Dispense Refill  . methenamine (HIPREX) 1 g tablet Take 1 g by mouth daily.      Drug Regimen Review Drug regimen was reviewed and remains appropriate with no significant  issues identified  Home: Home Living Family/patient expects to be discharged to:: Private residence Living Arrangements: Alone Available Help at Discharge: Family, Other (Comment)(son brings groceries) Type of Home: House Home Access: [place]: One level Bathroom Shower/Tub: Other (comment)(sponge bathes) Home Equipment: Wheelchair - power, Bedside commode Additional Comments: She has quite a setup that allows her to be independent at wheelchair transfer level   Functional History: Prior  Function Level of Independence: Independent with assistive device(s) Comments: Independent at power chair transfer level; propels to sink to sponge bathe; mostly microwave meals; her BSC is welded to her bed so she doesn't worry about it sliding  Functional Status:  Mobility: Bed Mobility Overal bed mobility: Needs Assistance Bed Mobility: Supine to Sit Supine to sit: Mod assist, +2 for physical assistance General bed mobility comments: used rails, increased time and effort due to dizziness.  Transfers Overall transfer level: Needs assistance Equipment used: None Transfers: Government social research officer transfers: Total assist, +2 physical assistance  Lateral/Scoot Transfers: Mod assist General transfer comment: Pt states she does not put weight on LEs and scoots to chair and pt does not have a drop arm chair therefore performed Anterior posterior transfer into chair today so that pt could sit up.  Ambulation/Gait General Gait Details: Pt does not ambulate at baseline    ADL: ADL Overall ADL's : Needs assistance/impaired Eating/Feeding: Independent, Sitting, Bed level Eating/Feeding Details (indicate cue type and reason): HOB raised Grooming: Wash/dry hands, Wash/dry face, Min guard, Sitting Upper Body Bathing: Moderate assistance, Sitting Upper Body Bathing Details (indicate cue type and reason): Poor sitting balance Lower Body Bathing: Maximal assistance Upper Body Dressing : Moderate assistance Lower Body Dressing: Total assistance Toilet Transfer: Moderate assistance Toilet Transfer Details (indicate cue type and reason): simulated by lateral scoot Toileting- Clothing Manipulation and Hygiene: Maximal assistance, Sitting/lateral lean  Cognition: Cognition Overall Cognitive Status: Within Functional Limits for tasks assessed Orientation Level: Oriented X4 Cognition Arousal/Alertness: Awake/alert Behavior During Therapy: WFL for tasks  assessed/performed Overall Cognitive Status: Within Functional Limits for tasks assessed  Physical Exam: Blood pressure (!) 102/58, pulse 62, temperature 98.1 F (36.7 C), temperature source Oral, resp. rate 16, height 5\' 3"  (1.6 m), weight 51.3 kg, SpO2 95 %. Physical Exam  Vitals reviewed. Constitutional: She appears well-developed and well-nourished.  HENT:  Head: Normocephalic and atraumatic.  Eyes: EOM are normal. Right eye exhibits no discharge. Left eye exhibits no discharge.  Neck: No tracheal deviation present. No thyromegaly present.  Respiratory: Effort normal. No stridor. No respiratory distress.  GI: She exhibits no distension.  Musculoskeletal:     Comments: No edema or tenderness in extremities  Neurological: She is alert.  Makes good eye contact.   Follows full commands.   [place]. Motor: Bilateral upper extremities: 5/5 proximal distal Right lower extremity: Hip flexion, knee extension 2+/5, ankle dorsiflexion 0/5, wiggles toes Left lower extremity: Hip flexion, knee extension 1/5, ankle dorsiflexion 0/5, wiggles toes Sensation intact light touch  Skin: Skin is warm and dry.  Psychiatric: She has a normal mood and affect. Her behavior is normal.    No results found for this or any previous visit (from the past 48 hour(s)). No results found.     Medical Problem List and Plan: 1.  Decreased functional mobility with severe vertigo secondary to MS exacerbation.  Patient completed 3-day course of Solu-Medrol.  Patient will need to be established with neurology on outpatient as she has not received follow-up since 2014  -patient may  may shower  -ELOS/Goals: Mod I/supervision at wheelchair level/7-12 days.  Admit to CIR 2.  Antithrombotics: -DVT/anticoagulation: Lovenox  -antiplatelet therapy: N/A 3. Pain Management: Tylenol as needed 4. Mood: Provide emotional support  -antipsychotic agents: N/A 5. Neuropsych: This patient is capable of making  decisions on her own behalf. 6. Skin/Wound Care: Routine skin checks 7. Fluids/Electrolytes/Nutrition: Routine in and outs.  CMP ordered. 8.  Vertigo.  Trial of Antivert 9.  Neurogenic bowel/ bladder.  Continue Mandelamine for now.  Adjust bowel program.  PVRs ordered  Charlton Amor, PA-C 12/02/2019  I have personally performed a face to face diagnostic evaluation, including, but not limited to relevant history and physical exam findings, of this patient and developed relevant assessment and plan.  Additionally, I have reviewed and concur with the physician assistant's documentation above.  Maryla Morrow, MD, ABPMR

## 2019-11-30 NOTE — Plan of Care (Signed)

## 2019-11-30 NOTE — Progress Notes (Signed)
PROGRESS NOTE  [name] ZOX:[id number] DOB: [birth year] DOA: 11/26/2019 PCP: Karie Schwalbe, MD  HPI/Recap of past 66 hours: 73 year old female with past medical history of multiple sclerosis wheelchair-bound, dyslipidemia, anemia presented with complaint of dizziness.  This patient got up in the morning went to the bathroom on completing her ADLs found she could not sit up because she became very dizzy, lasting all day came to the ER also was complaining of nausea and gagging but did not vomit.  subacute vertigo in the setting of MS. [name] was admitted, MRI brain showed 2 small foci of contrast-enhancement consistent with active demyelination within the periventricular white matter. Patient is scheduled to have 3 doses of solumedrol 1 gm  11/30/19: Seen and examined. Dizziness is intermittent. No nausea,  Receptive to placement in CIR.   Assessment/Plan: Principal Problem:   Multiple sclerosis exacerbation (HCC) Active Problems:   Dyslipidemia   Paraplegia, unspecified (HCC)   Pressure injury of skin  Multiple sclerosis exacerbation MRI brain 11/27/2019. Seen by neurology Completed 3 doses of IV Solu-Medrol Advised to follow-up with neurology outpatient.  Dr. Epimenio Foot of [facility]  Motion related dizziness/vertigo Continue meclizine Patient is receptive of CIR. CIR consulted for possible admission  Anemia of chronic disease Hemoglobin stable  Paraplegia, wheelchair-bound No acute issues  Recurrent UTI On methenamine, continue  Pressure ulcer, present on admission Continue local wound care  DVT prophylaxis: Lovenox sq daily Code Status:FULL  Family Communication: None at bedside.    Disposition Plan: Plan on CIR   Consultants: NEUROLOGY  Procedures: None   MRI Brain: Two small foci of contrast enhancement consistent with active demyelination within the periventricular white matter.  Microbiology:NONE  Antimicrobials: Anti-infectives (From  admission, onward)      Start     Dose/Rate Route Frequency Ordered Stop   11/27/19 1000  methenamine (MANDELAMINE) tablet 1 g     1 g Oral Daily 11/27/19 0545             Objective: Vitals:   11/29/19 1303 11/29/19 1936 11/30/19 0330 11/30/19 0733  BP: 122/69 117/70 113/62 134/71  Pulse: 68 66 64 (!) 55  Resp: 16   14  Temp: 98.1 F (36.7 C) 98.2 F (36.8 C) 97.9 F (36.6 C) 97.8 F (36.6 C)  TempSrc: Oral Oral Oral Oral  SpO2: 99% 95% 94% 97%  Weight:      Height:        Intake/Output Summary (Last 24 hours) at 11/30/2019 1040 Last data filed at 11/30/2019 0900 Gross per 24 hour  Intake 480 ml  Output 1400 ml  Net -920 ml   Filed Weights   11/26/19 1052 11/27/19 0548  Weight: 51.3 kg 51.3 kg    Exam:  . General: 73 y.o. year-old female well developed well nourished in no acute distress.  Alert and oriented x3. . Cardiovascular: Regular rate and rhythm with no rubs or gallops.  No thyromegaly or JVD noted.   Marland Kitchen Respiratory: Clear to auscultation with no wheezes or rales. Good inspiratory effort. . Abdomen: Soft nontender nondistended with normal bowel sounds x4 quadrants. . Musculoskeletal: Trace lower extremity edema. 2/4 pulses in all 4 extremities. Marland Kitchen Psychiatry: Mood is appropriate for condition and setting   Data Reviewed: CBC: Recent Labs  Lab 11/26/19 1058 11/27/19 0827 11/28/19 0309  WBC 14.3* 5.8 9.4  NEUTROABS 13.2* 5.2 7.2  HGB 13.1 12.8 11.4*  HCT 40.2 38.3 34.1*  MCV 95.3 93.9 93.7  PLT 344 328 331  Basic Metabolic Panel: Recent Labs  Lab 11/26/19 1058 11/27/19 0827 11/28/19 0309  NA 139 143 140  K 3.9 4.0 4.3  CL 107 114* 109  CO2 23 21* 23  GLUCOSE 126* 136* 136*  BUN 13 7* 19  CREATININE 0.60 0.53 0.71  CALCIUM 8.4* 8.8* 8.6*  MG  --   --  2.0  PHOS  --  2.5 2.2*   GFR: Estimated Creatinine Clearance: 50.7 mL/min (by C-G formula based on SCr of 0.71 mg/dL). Liver Function Tests: Recent Labs  Lab 11/26/19  1058 11/27/19 0827 11/28/19 0309  AST 20  --   --   ALT 18  --   --   ALKPHOS 64  --   --   BILITOT 0.8  --   --   PROT 6.1*  --   --   ALBUMIN 3.5 3.1* 2.7*   No results for input(s): LIPASE, AMYLASE in the last 168 hours. No results for input(s): AMMONIA in the last 168 hours. Coagulation Profile: No results for input(s): INR, PROTIME in the last 168 hours. Cardiac Enzymes: No results for input(s): CKTOTAL, CKMB, CKMBINDEX, TROPONINI in the last 168 hours. BNP (last 3 results) No results for input(s): PROBNP in the last 8760 hours. HbA1C: No results for input(s): HGBA1C in the last 72 hours. CBG: No results for input(s): GLUCAP in the last 168 hours. Lipid Profile: No results for input(s): CHOL, HDL, LDLCALC, TRIG, CHOLHDL, LDLDIRECT in the last 72 hours. Thyroid Function Tests: No results for input(s): TSH, T4TOTAL, FREET4, T3FREE, THYROIDAB in the last 72 hours. Anemia Panel: Recent Labs    11/28/19 0814  VITAMINB12 278  FERRITIN 120  TIBC 255  IRON 130   Urine analysis:    Component Value Date/Time   COLORURINE STRAW (A) 11/26/2019 1046   APPEARANCEUR CLEAR 11/26/2019 1046   LABSPEC 1.010 11/26/2019 1046   PHURINE 7.0 11/26/2019 1046   GLUCOSEU 50 (A) 11/26/2019 1046   HGBUR NEGATIVE 11/26/2019 1046   BILIRUBINUR NEGATIVE 11/26/2019 1046   BILIRUBINUR neg 07/20/2018 1608   KETONESUR 20 (A) 11/26/2019 1046   PROTEINUR NEGATIVE 11/26/2019 1046   UROBILINOGEN 0.2 07/20/2018 1608   UROBILINOGEN 0.2 05/26/2007 2307   NITRITE NEGATIVE 11/26/2019 1046   LEUKOCYTESUR NEGATIVE 11/26/2019 1046   Sepsis Labs: @LABRCNTIP (procalcitonin:4,lacticidven:4)  ) Recent Results (from the past 240 hour(s))  SARS CORONAVIRUS 2 (TAT 6-24 HRS) Nasopharyngeal Nasopharyngeal Swab     Status: None   Collection Time: 11/26/19 11:45 AM   Specimen: Nasopharyngeal Swab  Result Value Ref Range Status   SARS Coronavirus 2 NEGATIVE NEGATIVE Final    Comment: (NOTE) SARS-CoV-2  target nucleic acids are NOT DETECTED. The SARS-CoV-2 RNA is generally detectable in upper and lower respiratory specimens during the acute phase of infection. Negative results do not preclude SARS-CoV-2 infection, do not rule out co-infections with other pathogens, and should not be used as the sole basis for treatment or other patient management decisions. Negative results must be combined with clinical observations, patient history, and epidemiological information. The expected result is Negative. Fact Sheet for Patients: HairSlick.no Fact Sheet for Healthcare Providers: quierodirigir.com This test is not yet approved or cleared by the [place] FDA and  has been authorized for detection and/or diagnosis of SARS-CoV-2 by FDA under an Emergency Use Authorization (EUA). This EUA will remain  in effect (meaning this test can be used) for the duration of the COVID-19 declaration under Section 56 4(b)(1) of the Act, 21 U.S.C. section 360bbb-3(b)(1), unless  the authorization is terminated or revoked sooner. Performed at [facility], [place] [address], [place], [place] [address]       Studies: No results found.  Scheduled Meds: . enoxaparin (LOVENOX) injection  40 mg Subcutaneous Q24H  . methenamine  1 g Oral Daily  . phosphorus  250 mg Oral TID  . vitamin B-12  250 mcg Oral Daily    Continuous Infusions: . methylPREDNISolone (SOLU-MEDROL) injection 1,000 mg (11/29/19 0951)     LOS: 3 days     Kayleen Memos, MD [facility] Pager [phone]  If 7PM-7AM, please contact night-coverage [email or web address] Password [email or web address] 11/30/2019, 10:41 AM

## 2019-11-30 NOTE — Progress Notes (Signed)
Inpatient Rehabilitation Admissions Coordinator  I met with patient at bedside to discuss goals and expectations of a possible inpt rehab admit. She is in agreement to CIR, but will not agree to SNF if insurance denies admission to CIR. I will begin insurance approval for a possible inpt rehab admit pending insurance approval.  Danne Baxter, RN, MSN Rehab Admissions Coordinator [phone] 11/30/2019 11:08 AM

## 2019-11-30 NOTE — Plan of Care (Signed)

## 2019-12-01 NOTE — Progress Notes (Signed)
Physical Therapy Treatment Patient Details Name: [name] MRN: [id number] DOB: 06-Feb-1946 Today's Date: 12/01/2019    History of Present Illness Admitted with balance deficits, dizziness, MS exacerbation;  has a past medical history of Ankle fractures, Compression fracture (2011), Disc herniation, MS (multiple sclerosis) ([place]), MVP (mitral valve prolapse) (1979),  Osteoporosis, Rib fractures (1998, 4034,7425), and Toe fracture (1998).    PT Comments    Pt admitted with above diagnosis. Pt still slightly dizzy at times.  Pt retested for BPPV and did appear to have left horizontal cupulolithiasis.   Treated with BBQ roll done as quickly as possible.  PT then assisted pt to the chair via anterior posterior transfer with total assist of 2.  Pt enjoys sitting up.  She did state she has a red area on buttocks per nurse therefore placed a pillow under pt. Also asked Nurse to ask MD for an air mattress overlay for pt.  Pt does urinate frequently and this was premorbid. Pt currently with functional limitations due to the deficits listed below (see PT Problem List). Pt will benefit from skilled PT to increase their independence and safety with mobility to allow discharge to the venue listed below.     Follow Up Recommendations  CIR     Equipment Recommendations  None recommended by PT(quite well-equipped)    Recommendations for Other Services OT consult(ordered per protocol)     Precautions / Restrictions Precautions Precautions: Fall Restrictions Weight Bearing Restrictions: No    Mobility  Bed Mobility Overal bed mobility: Needs Assistance Bed Mobility: Supine to Sit     Supine to sit: Mod assist;+2 for physical assistance     General bed mobility comments: used rails, increased time and effort due to dizziness.   Transfers Overall transfer level: Needs assistance Equipment used: None Transfers: Comptroller transfers: Total  assist;+2 physical assistance   General transfer comment: Pt states she does not put weight on LEs and scoots to chair and pt does not have a drop arm chair therefore performed Anterior posterior transfer into chair today so that pt could sit up.   Ambulation/Gait             General Gait Details: Pt does not ambulate at baseline   Stairs             Wheelchair Mobility    Modified Rankin (Stroke Patients Only)       Balance Overall balance assessment: Needs assistance Sitting-balance support: Bilateral upper extremity supported Sitting balance-Leahy Scale: Poor Sitting balance - Comments: demos  posterior pelvic tilt and required mod A to prevent fall backwards sitting EOB with worsening balance initially after the canalith repositioning manuever.  can sit with min guard assist when not dizzy but needed max assist when dizzy.  Postural control: Posterior lean                                  Cognition Arousal/Alertness: Awake/alert Behavior During Therapy: WFL for tasks assessed/performed Overall Cognitive Status: Within Functional Limits for tasks assessed                                        Exercises      General Comments General comments (skin integrity, edema, etc.): Pt appeared to have some residual nystagmus with  a positive supine head roll for horizontal cupulolithiasis. Treated with left BBQ roll.  Pt reports feeling better once in chair at end of treatment.  Previous sessions have improved pts symptoms therefore PT is hopeful that this will continue.       Pertinent Vitals/Pain Pain Assessment: No/denies pain    Home Living                      Prior Function            PT Goals (current goals can now be found in the care plan section) Acute Rehab PT Goals Patient Stated Goal: to go home Progress towards PT goals: Progressing toward goals    Frequency    Min 4X/week      PT Plan Current plan  remains appropriate    Co-evaluation              AM-PAC PT "6 Clicks" Mobility   Outcome Measure  Help needed turning from your back to your side while in a flat bed without using bedrails?: A Lot Help needed moving from lying on your back to sitting on the side of a flat bed without using bedrails?: A Lot Help needed moving to and from a bed to a chair (including a wheelchair)?: Total Help needed standing up from a chair using your arms (e.g., wheelchair or bedside chair)?: Total Help needed to walk in hospital room?: Total Help needed climbing 3-5 steps with a railing? : Total 6 Click Score: 8    End of Session   Activity Tolerance: Patient tolerated treatment well;Patient limited by fatigue Patient left: with call bell/phone within reach;in chair;with chair alarm set Nurse Communication: Mobility status;Need for lift equipment(use Maxi move to get pt back to bed) PT Visit Diagnosis: Other abnormalities of gait and mobility (R26.89);Dizziness and giddiness (R42);BPPV BPPV - Right/Left : Left     Time: 8099-8338 PT Time Calculation (min) (ACUTE ONLY): 28 min  Charges:  $Therapeutic Activity: 8-22 mins $Canalith Rep Proc: 8-22 mins                     Merie Wulf W,PT Acute Rehabilitation Services Pager:  [phone]  Office:  [phone]     Berline Lopes 12/01/2019, 10:36 AM

## 2019-12-01 NOTE — Progress Notes (Signed)
Inpatient Rehabilitation Admissions Coordinator  I have insurance approval to admit pt to inpt rehab but no bed available today. I met with patient at bedside and she is aware. I will follow up tomorrow to clarify if we will have a bed Friday, Saturday or Sunday to admit. Pt wishes to discuss with her son, for she is torn between admit to CIR vs high risk to develop breakdown on her buttocks .  Danne Baxter, RN, MSN Rehab Admissions Coordinator [phone] 12/01/2019 1:09 PM

## 2019-12-01 NOTE — Plan of Care (Addendum)
Pt requesting alternative to Antivert reporting it is ineffective.   Problem: Education: Goal: Knowledge of General Education information will improve Description: Including pain rating scale, medication(s)/side effects and non-pharmacologic comfort measures Outcome: Progressing   Problem: Safety: Goal: Ability to remain free from injury will improve Outcome: Progressing

## 2019-12-01 NOTE — Plan of Care (Signed)

## 2019-12-01 NOTE — Progress Notes (Signed)
Patient and son had manyquestions regarding CIR, called Danne Baxter and spoke to her whom in turn called patient's room and spoke to son.  Patient had concern that she would develop a PU if she had to stay in that mattress/bed, patient was ordered an air mattress for comfort, also discussed with aptient and son that he could bring in her gel donut for patient to use while here.  Discussed and educated patient on importance of turning side to side as well as allowing staff to use pillows to move her from side to side and for pressure relief on her bottom.  Patient did not like to be side to side because of the purewick but patient relented and allowed me to use pillows to support her.

## 2019-12-01 NOTE — Progress Notes (Signed)
Inpatient Rehabilitation Admissions Coordinator  I spoke with pt's son by phone to answer his questions concerning goals and expectations of an inpt rehab admit. I will follow up tomorrow morning to clarify if bed will be available for pt to admit to over next 2 days and if pt prefers CIR admit vs d/c home. Son in agreement to plan.  Danne Baxter, RN, MSN Rehab Admissions Coordinator [phone] 12/01/2019 8:17 PM

## 2019-12-01 NOTE — Progress Notes (Signed)
PROGRESS NOTE  [name]:[id number] DOB: July 02, 1946 DOA: 11/26/2019 PCP: Karie Schwalbe, MD  HPI/Recap of past 86 hours: 73 year old female with past medical history of multiple sclerosis wheelchair-bound, dyslipidemia, anemia presented with complaint of dizziness.  This patient got up in the morning went to the bathroom on completing her ADLs found she could not sit up because she became very dizzy, lasting all day came to the ER also was complaining of nausea and gagging but did not vomit.  subacute vertigo in the setting of MS. [name] was admitted, MRI brain showed 2 small foci of contrast-enhancement consistent with active demyelination within the periventricular white matter. Patient is scheduled to have 3 doses of solumedrol 1 gm  12/01/19: Continues to have vertigo intermittently. Appetite good. No pain, SOB, CP. Awaiting approval from insurance for CIR.  Assessment/Plan: Principal Problem:   Multiple sclerosis exacerbation (HCC) Active Problems:   Dyslipidemia   Paraplegia, unspecified (HCC)   Pressure injury of skin  Multiple sclerosis exacerbation MRI brain 11/27/2019. Seen by neurology Completed 3 doses of IV Solu-Medrol Advised to follow-up with neurology outpatient.  Dr. Epimenio Foot of [facility]  Motion related dizziness/vertigo Continue meclizine Patient is receptive of CIR. CIR consulted for possible admission Pending approval.  Anemia of chronic disease Hemoglobin stable  Paraplegia, wheelchair-bound No acute issues  Recurrent UTI On methenamine, continue  Pressure ulcer, present on admission Continue local wound care  DVT prophylaxis: Lovenox sq daily Code Status:FULL  Family Communication: None at bedside.    Disposition Plan: Plan on CIR   Consultants: NEUROLOGY  Procedures: None   MRI Brain: Two small foci of contrast enhancement consistent with active demyelination within the periventricular white  matter.  Microbiology:NONE  Antimicrobials: Anti-infectives (From admission, onward)      Start     Dose/Rate Route Frequency Ordered Stop   11/27/19 1000  methenamine (MANDELAMINE) tablet 1 g     1 g Oral Daily 11/27/19 0545             Objective: Vitals:   11/30/19 1339 11/30/19 1900 12/01/19 0406 12/01/19 0903  BP: 120/66 120/66 122/68 119/64  Pulse: 70 70 61 79  Resp: 15 16 18 15   Temp: 98.1 F (36.7 C) 98.3 F (36.8 C) 98.5 F (36.9 C) 97.9 F (36.6 C)  TempSrc: Oral Oral Oral Oral  SpO2: 98% 100% 96% 99%  Weight:      Height:        Intake/Output Summary (Last 24 hours) at 12/01/2019 12/03/2019 Last data filed at 12/01/2019 0900 Gross per 24 hour  Intake 120 ml  Output 1700 ml  Net -1580 ml   Filed Weights   11/26/19 1052 11/27/19 0548  Weight: 51.3 kg 51.3 kg    Exam:  . General: 73 y.o. year-old female well developed well nourished in no acute distress.  Alert and oriented x3. . Cardiovascular: Regular rates. No murmurs. No JVD. 65 Respiratory: Clear to auscultation bilaterally. No wheezes, rales, rhonchi. Normal respiratory effort. . Abdomen: Soft, nontender, nondistended. Appropriate bowel sounds. No organosplenomegaly. . Musculoskeletal: No edema. 2+ pulses. Extremities warm to touch . Psychiatry: Mood is appropriate for condition and setting   Data Reviewed: CBC: Recent Labs  Lab 11/26/19 1058 11/27/19 0827 11/28/19 0309  WBC 14.3* 5.8 9.4  NEUTROABS 13.2* 5.2 7.2  HGB 13.1 12.8 11.4*  HCT 40.2 38.3 34.1*  MCV 95.3 93.9 93.7  PLT 344 328 331   Basic Metabolic Panel: Recent Labs  Lab 11/26/19 1058 11/27/19 0827  11/28/19 0309  NA 139 143 140  K 3.9 4.0 4.3  CL 107 114* 109  CO2 23 21* 23  GLUCOSE 126* 136* 136*  BUN 13 7* 19  CREATININE 0.60 0.53 0.71  CALCIUM 8.4* 8.8* 8.6*  MG  --   --  2.0  PHOS  --  2.5 2.2*   GFR: Estimated Creatinine Clearance: 50.7 mL/min (by C-G formula based on SCr of 0.71 mg/dL). Liver  Function Tests: Recent Labs  Lab 11/26/19 1058 11/27/19 0827 11/28/19 0309  AST 20  --   --   ALT 18  --   --   ALKPHOS 64  --   --   BILITOT 0.8  --   --   PROT 6.1*  --   --   ALBUMIN 3.5 3.1* 2.7*   No results for input(s): LIPASE, AMYLASE in the last 168 hours. No results for input(s): AMMONIA in the last 168 hours. Coagulation Profile: No results for input(s): INR, PROTIME in the last 168 hours. Cardiac Enzymes: No results for input(s): CKTOTAL, CKMB, CKMBINDEX, TROPONINI in the last 168 hours. BNP (last 3 results) No results for input(s): PROBNP in the last 8760 hours. HbA1C: No results for input(s): HGBA1C in the last 72 hours. CBG: No results for input(s): GLUCAP in the last 168 hours. Lipid Profile: No results for input(s): CHOL, HDL, LDLCALC, TRIG, CHOLHDL, LDLDIRECT in the last 72 hours. Thyroid Function Tests: No results for input(s): TSH, T4TOTAL, FREET4, T3FREE, THYROIDAB in the last 72 hours. Anemia Panel: No results for input(s): VITAMINB12, FOLATE, FERRITIN, TIBC, IRON, RETICCTPCT in the last 72 hours. Urine analysis:    Component Value Date/Time   COLORURINE STRAW (A) 11/26/2019 1046   APPEARANCEUR CLEAR 11/26/2019 1046   LABSPEC 1.010 11/26/2019 1046   PHURINE 7.0 11/26/2019 1046   GLUCOSEU 50 (A) 11/26/2019 1046   HGBUR NEGATIVE 11/26/2019 1046   BILIRUBINUR NEGATIVE 11/26/2019 1046   BILIRUBINUR neg 07/20/2018 1608   KETONESUR 20 (A) 11/26/2019 1046   PROTEINUR NEGATIVE 11/26/2019 1046   UROBILINOGEN 0.2 07/20/2018 1608   UROBILINOGEN 0.2 05/26/2007 2307   NITRITE NEGATIVE 11/26/2019 1046   LEUKOCYTESUR NEGATIVE 11/26/2019 1046   Sepsis Labs: @LABRCNTIP (procalcitonin:4,lacticidven:4)  ) Recent Results (from the past 240 hour(s))  SARS CORONAVIRUS 2 (TAT 6-24 HRS) Nasopharyngeal Nasopharyngeal Swab     Status: None   Collection Time: 11/26/19 11:45 AM   Specimen: Nasopharyngeal Swab  Result Value Ref Range Status   SARS Coronavirus 2  NEGATIVE NEGATIVE Final    Comment: (NOTE) SARS-CoV-2 target nucleic acids are NOT DETECTED. The SARS-CoV-2 RNA is generally detectable in upper and lower respiratory specimens during the acute phase of infection. Negative results do not preclude SARS-CoV-2 infection, do not rule out co-infections with other pathogens, and should not be used as the sole basis for treatment or other patient management decisions. Negative results must be combined with clinical observations, patient history, and epidemiological information. The expected result is Negative. Fact Sheet for Patients: HairSlick.no Fact Sheet for Healthcare Providers: quierodirigir.com This test is not yet approved or cleared by the [place] FDA and  has been authorized for detection and/or diagnosis of SARS-CoV-2 by FDA under an Emergency Use Authorization (EUA). This EUA will remain  in effect (meaning this test can be used) for the duration of the COVID-19 declaration under Section 56 4(b)(1) of the Act, 21 U.S.C. section 360bbb-3(b)(1), unless the authorization is terminated or revoked sooner. Performed at [facility], [address],  [place], [place] [address]       Studies: No results found.  Scheduled Meds: . enoxaparin (LOVENOX) injection  40 mg Subcutaneous Q24H  . methenamine  1 g Oral Daily  . phosphorus  250 mg Oral TID  . vitamin B-12  250 mcg Oral Daily    Continuous Infusions: . methylPREDNISolone (SOLU-MEDROL) injection 1,000 mg (11/30/19 1334)     LOS: 4 days     Truett Mainland, DO 12/01/2019, 9:29 AM

## 2019-12-02 ENCOUNTER — Encounter (HOSPITAL_COMMUNITY): Payer: Self-pay | Admitting: Physical Medicine and Rehabilitation

## 2019-12-02 ENCOUNTER — Inpatient Hospital Stay (HOSPITAL_COMMUNITY)
Admission: RE | Admit: 2019-12-02 | Discharge: 2019-12-16 | DRG: 059 | Disposition: A | Discharge status: Home / Self Care | Payer: Medicare HMO | Source: Intra-hospital | Attending: Physical Medicine and Rehabilitation | Admitting: Physical Medicine and Rehabilitation

## 2019-12-02 ENCOUNTER — Other Ambulatory Visit: Payer: Self-pay

## 2019-12-02 DIAGNOSIS — K592 Neurogenic bowel, not elsewhere classified: Secondary | ICD-10-CM | POA: Diagnosis present

## 2019-12-02 DIAGNOSIS — I9589 Other hypotension: Secondary | ICD-10-CM | POA: Diagnosis not present

## 2019-12-02 DIAGNOSIS — R42 Dizziness and giddiness: Secondary | ICD-10-CM

## 2019-12-02 DIAGNOSIS — D62 Acute posthemorrhagic anemia: Secondary | ICD-10-CM | POA: Diagnosis not present

## 2019-12-02 DIAGNOSIS — G8220 Paraplegia, unspecified: Secondary | ICD-10-CM | POA: Diagnosis present

## 2019-12-02 DIAGNOSIS — Z90710 Acquired absence of both cervix and uterus: Secondary | ICD-10-CM | POA: Diagnosis not present

## 2019-12-02 DIAGNOSIS — N319 Neuromuscular dysfunction of bladder, unspecified: Secondary | ICD-10-CM | POA: Diagnosis not present

## 2019-12-02 DIAGNOSIS — R030 Elevated blood-pressure reading, without diagnosis of hypertension: Secondary | ICD-10-CM | POA: Diagnosis not present

## 2019-12-02 DIAGNOSIS — Z888 Allergy status to other drugs, medicaments and biological substances status: Secondary | ICD-10-CM

## 2019-12-02 DIAGNOSIS — D72829 Elevated white blood cell count, unspecified: Secondary | ICD-10-CM | POA: Diagnosis present

## 2019-12-02 DIAGNOSIS — I341 Nonrheumatic mitral (valve) prolapse: Secondary | ICD-10-CM | POA: Diagnosis present

## 2019-12-02 DIAGNOSIS — E875 Hyperkalemia: Secondary | ICD-10-CM | POA: Diagnosis not present

## 2019-12-02 DIAGNOSIS — R0989 Other specified symptoms and signs involving the circulatory and respiratory systems: Secondary | ICD-10-CM | POA: Diagnosis not present

## 2019-12-02 DIAGNOSIS — I69398 Other sequelae of cerebral infarction: Secondary | ICD-10-CM

## 2019-12-02 DIAGNOSIS — I959 Hypotension, unspecified: Secondary | ICD-10-CM | POA: Diagnosis present

## 2019-12-02 DIAGNOSIS — Z87440 Personal history of urinary (tract) infections: Secondary | ICD-10-CM

## 2019-12-02 DIAGNOSIS — Z79899 Other long term (current) drug therapy: Secondary | ICD-10-CM | POA: Diagnosis not present

## 2019-12-02 DIAGNOSIS — Z90722 Acquired absence of ovaries, bilateral: Secondary | ICD-10-CM

## 2019-12-02 DIAGNOSIS — G35 Multiple sclerosis: Secondary | ICD-10-CM | POA: Diagnosis not present

## 2019-12-02 DIAGNOSIS — Z993 Dependence on wheelchair: Secondary | ICD-10-CM | POA: Diagnosis not present

## 2019-12-02 DIAGNOSIS — Z9079 Acquired absence of other genital organ(s): Secondary | ICD-10-CM | POA: Diagnosis not present

## 2019-12-02 MED ORDER — METHENAMINE MANDELATE 1 G PO TABS
1.0000 g | ORAL_TABLET | Freq: Every day | ORAL | Status: DC
  Administered 2019-12-04 – 2019-12-16 (×7): 1 g via ORAL
  Filled 2019-12-02 (×14): qty 1

## 2019-12-02 MED ORDER — ENOXAPARIN SODIUM 40 MG/0.4ML ~~LOC~~ SOLN: 40.0000 mg | SUBCUTANEOUS | Status: DC

## 2019-12-02 MED ORDER — SORBITOL 70 % SOLN: 30.0000 mL | Freq: Every day | Status: DC | PRN

## 2019-12-02 MED ORDER — ACETAMINOPHEN 650 MG RE SUPP: 650.0000 mg | Freq: Four times a day (QID) | RECTAL | Status: DC | PRN

## 2019-12-02 MED ORDER — ACETAMINOPHEN 325 MG PO TABS: 650.0000 mg | ORAL_TABLET | Freq: Four times a day (QID) | ORAL | Status: DC | PRN

## 2019-12-02 MED ORDER — ENOXAPARIN SODIUM 40 MG/0.4ML ~~LOC~~ SOLN
40.0000 mg | SUBCUTANEOUS | Status: DC
  Administered 2019-12-03 – 2019-12-16 (×14): 40 mg via SUBCUTANEOUS
  Filled 2019-12-02 (×14): qty 0.4

## 2019-12-02 MED ORDER — MECLIZINE HCL 25 MG PO TABS
25.0000 mg | ORAL_TABLET | Freq: Three times a day (TID) | ORAL | Status: DC | PRN
  Administered 2019-12-04: 04:00:00 25 mg via ORAL
  Filled 2019-12-02: qty 1

## 2019-12-02 MED ORDER — CYANOCOBALAMIN 500 MCG PO TABS
250.0000 ug | ORAL_TABLET | Freq: Every day | ORAL | Status: DC
  Administered 2019-12-03 – 2019-12-16 (×14): 250 ug via ORAL
  Filled 2019-12-02 (×14): qty 1

## 2019-12-02 MED ORDER — ONDANSETRON HCL 4 MG PO TABS: 4.0000 mg | ORAL_TABLET | Freq: Four times a day (QID) | ORAL | Status: DC | PRN

## 2019-12-02 MED ORDER — ONDANSETRON HCL 4 MG/2ML IJ SOLN: 4.0000 mg | Freq: Four times a day (QID) | INTRAMUSCULAR | Status: DC | PRN

## 2019-12-02 MED ORDER — POLYETHYLENE GLYCOL 3350 17 G PO PACK
17.0000 g | PACK | Freq: Every day | ORAL | Status: DC | PRN
  Filled 2019-12-02: qty 1

## 2019-12-02 NOTE — Progress Notes (Signed)
Inpatient Rehabilitation Admissions Coordinator  Inpatient rehab bed is available for pt to admit to today. I met with patient at bedside with Dr. Nehemiah Settle and pt is in agreement. I will make the arrangements to admit today. SW, [place] made aware.   Danne Baxter, RN, MSN Rehab Admissions Coordinator [phone] 12/02/2019 10:29 AM

## 2019-12-02 NOTE — Progress Notes (Signed)
Physical Therapy Treatment Patient Details Name: [name] MRN: [id number] DOB: [birth year] Today's Date: 12/02/2019    History of Present Illness Admitted with balance deficits, dizziness, MS exacerbation;  has a past medical history of Ankle fractures, Compression fracture (2011), Disc herniation, MS (multiple sclerosis) (HCC), MVP (mitral valve prolapse) (1979),  Osteoporosis, Rib fractures (1998, 1791,5056), and Toe fracture (1998).    PT Comments    Pt admitted with above diagnosis. Pt tested for BPPV with decr symptoms each day after each treatment performed.  Pt with symptoms suggesting left horizontal canalithiasis and treated with left BBQ roll.  Noted pt with BM and notified nursing after PT cleaned pt up as pt appears to be impacted as the stool was very hard and there was some still noted to be in rectum. Pt also reported that she hasnt had a BM in a week.  Notified NT to replace purewick as well.  Pt positioned well in bed.   Pt currently with functional limitations due to the deficits listed below (see PT Problem List). Pt will benefit from skilled PT to increase their independence and safety with mobility to allow discharge to the venue listed below.     Follow Up Recommendations  CIR     Equipment Recommendations  None recommended by PT(quite well-equipped)    Recommendations for Other Services OT consult(ordered per protocol)     Precautions / Restrictions Precautions Precautions: Fall Restrictions Weight Bearing Restrictions: No    Mobility  Bed Mobility Overal bed mobility: Needs Assistance Bed Mobility: Supine to Sit     Supine to sit: Mod assist;+2 for physical assistance     General bed mobility comments: used rails, increased time and effort due to dizziness.   Transfers                    Ambulation/Gait             General Gait Details: Pt does not ambulate at baseline   Stairs             Wheelchair Mobility     Modified Rankin (Stroke Patients Only)       Balance Overall balance assessment: Needs assistance Sitting-balance support: Bilateral upper extremity supported Sitting balance-Leahy Scale: Poor Sitting balance - Comments: demos  posterior pelvic tilt and required mod A to prevent fall backwards sitting EOB with worsening balance initially after the canalith repositioning manuever.  can sit with min guard assist when not dizzy but needed max assist when dizzy.  Postural control: Posterior lean                                  Cognition Arousal/Alertness: Awake/alert Behavior During Therapy: WFL for tasks assessed/performed Overall Cognitive Status: Within Functional Limits for tasks assessed                                        Exercises      General Comments General comments (skin integrity, edema, etc.): Pt appeared to have some residual nystagmus with a positive supine head roll for left horizontal canalithiasis. Treated with left BBQ roll.       Pertinent Vitals/Pain Pain Assessment: No/denies pain    Home Living  Prior Function            PT Goals (current goals can now be found in the care plan section) Acute Rehab PT Goals Patient Stated Goal: to go home Progress towards PT goals: Progressing toward goals    Frequency    Min 4X/week      PT Plan Current plan remains appropriate    Co-evaluation              AM-PAC PT "6 Clicks" Mobility   Outcome Measure  Help needed turning from your back to your side while in a flat bed without using bedrails?: A Lot Help needed moving from lying on your back to sitting on the side of a flat bed without using bedrails?: A Lot Help needed moving to and from a bed to a chair (including a wheelchair)?: Total Help needed standing up from a chair using your arms (e.g., wheelchair or bedside chair)?: Total Help needed to walk in hospital room?:  Total Help needed climbing 3-5 steps with a railing? : Total 6 Click Score: 8    End of Session   Activity Tolerance: Patient tolerated treatment well;Patient limited by fatigue Patient left: with call bell/phone within reach;in bed Nurse Communication: Mobility status;Need for lift equipment(use Maxi move to get pt back to bed) PT Visit Diagnosis: Other abnormalities of gait and mobility (R26.89);Dizziness and giddiness (R42);BPPV BPPV - Right/Left : Left     Time: 1350-1408 PT Time Calculation (min) (ACUTE ONLY): 18 min  Charges:  $Canalith Rep Proc: 8-22 mins                     Daisi Kentner W,PT Acute Rehabilitation Services Pager:  [phone]  Office:  [place] 12/02/2019, 3:12 PM

## 2019-12-02 NOTE — Progress Notes (Signed)
Pt admitted to 4M12. Son at bedside. Pt verablized concerns that she would not be able to manage her bladder as she did at home. RN set up purewik that was previously present and explained that we would re-evaluate with the assistance of OT. Pt alert and oriented and vitals are within normal range. Pt in bed with bed alarm in place and call bell within reach. Continue plan of care.

## 2019-12-02 NOTE — PMR Pre-admission (Addendum)
PMR Admission Coordinator Pre-Admission Assessment  Patient: [name] is an 73 y.o., female MRN: [id number] DOB: 14-Oct-1946 Height: 5\' 3"  (160 cm) Weight: 51.3 kg              Insurance Information HMO:     PPO: yes     PCP:      IPA:      80/20:      OTHER:  PRIMARY: [employer] Medicare      Policy#: [id number]      Subscriber: pt CM Name: Hinton Dyer      Phone#: [phone]     Fax#: portal Pre-Cert#: [id number]      Employer:  Benefits:  Phone #: [phone]     Name: 12/16 Eff. Date: 12/15/2018     Deduct: none      Out of Pocket Max: $4200      Life Max: none CIR: $250 co pay per day days 1 until 6      SNF: no co pay per days days 1 until 20; $178 co pay per day days 21 until 100 Outpatient: $35 per visit     Co-Pay: visits per medical neccesity Home Health: 100%      Co-Pay: visits per medical neccesity DME: 80%     Co-Pay: 20% Providers: in network  SECONDARY: none        Medicaid Application Date:       Case Manager:  Disability Application Date:       Case Worker:   The "Data Collection Information Summary" for patients in Inpatient Rehabilitation Facilities with attached "Privacy Act [place] Records" was provided and verbally reviewed with: Patient  Emergency Contact Information Contact Information    Name Relation Home Work Mobile   [place] Son   [phone]     Current Medical History  Patient Admitting Diagnosis: debility with multiple sclerosis and vertigo  History of Present Illness:72 years old female with history of multiple sclerosis diagnosed 1982 and was last seen by neurology services Dr. Corwin Levins in 2014, lumbar disc surgery, mitral valve prolapse.  Patient is maintained only on Mandelamine for recurrent UTIs. Presented 11/27/2019 with increasing dizziness over the past few days.  Admission chemistries with urinalysis negative nitrite, WBC 14,300, SARS coronavirus negative.  Cranial CT scan and MRI showed negative for acute  infarction.  Periventricular deep white matter hyper intensities compatible with multiple sclerosis.  There were no prior MRIs for comparison purposes.  Neurology follow-up placed on Solu-Medrol 1 g x 3 days for suspected MS exacerbation.  Subcutaneous Lovenox for DVT prophylaxis.  Tolerating regular diet.  Past Medical History  Past Medical History:  Diagnosis Date  . Ankle fracture, left 07/2006  . Ankle fracture, right 10/30/06   [facility]  . Compression fracture 2011   Dr. Mardelle Matte   . Disc herniation    L5, S1  . MS (multiple sclerosis) ([place])    evaluated by Dr. Erling Cruz  . MVP (mitral valve prolapse) 1979   ECHO. Trace MR, trace TR, trace PR 07/17/03  . Nephrolithiasis   . Osteoporosis   . Rib fractures 1998, [id number]   2  . Toe fracture 1998   5    Family History  family history includes Neuropathy in her brother; Stroke in her father and mother.  Prior Rehab/Hospitalizations:  Has the patient had prior rehab or hospitalizations prior to admission? Yes  Has the patient had major surgery during 100 days prior to admission? No  Current Medications   Current Facility-Administered Medications:  .  acetaminophen (TYLENOL) tablet 650 mg, 650 mg, Oral, Q6H PRN **OR** acetaminophen (TYLENOL) suppository 650 mg, 650 mg, Rectal, Q6H PRN, Crosley, Debby, MD .  enoxaparin (LOVENOX) injection 40 mg, 40 mg, Subcutaneous, Q24H, Crosley, Debby, MD, 40 mg at 12/02/19 0511 .  meclizine (ANTIVERT) tablet 25 mg, 25 mg, Oral, TID PRN, Lanae Boast, MD, 25 mg at 12/01/19 0956 .  methenamine (MANDELAMINE) tablet 1 g, 1 g, Oral, Daily, Crosley, Debby, MD, 1 g at 12/02/19 0910 .  ondansetron (ZOFRAN) tablet 4 mg, 4 mg, Oral, Q6H PRN **OR** ondansetron (ZOFRAN) injection 4 mg, 4 mg, Intravenous, Q6H PRN, Crosley, Debby, MD .  polyethylene glycol (MIRALAX / GLYCOLAX) packet 17 g, 17 g, Oral, Daily PRN, Crosley, Debby, MD .  vitamin B-12 (CYANOCOBALAMIN) tablet 250 mcg, 250 mcg, Oral, Daily, Kyle, Tyrone A, DO,  250 mcg at 12/02/19 0909  Patients Current Diet:  Diet Order            Diet - low sodium heart healthy        Diet Heart Room service appropriate? Yes; Fluid consistency: Thin  Diet effective now              Precautions / Restrictions Precautions Precautions: Fall Restrictions Weight Bearing Restrictions: No   Has the patient had 2 or more falls or a fall with injury in the past year?No  Prior Activity Level Limited Community (1-2x/wk): Mod I at wheelchair level; does not drive  Prior Functional Level Prior Function Level of Independence: Independent with assistive device(s) Comments: Independent at power chair transfer level; propels to sink to sponge bathe; mostly microwave meals; her BSC is welded to her bed so she doesn't worry about it sliding  Self Care: Did the patient need help bathing, dressing, using the toilet or eating?  Independent  Indoor Mobility: Did the patient need assistance with walking from room to room (with or without device)? Independent  Stairs: Did the patient need assistance with internal or external stairs (with or without device)? Independent  Functional Cognition: Did the patient need help planning regular tasks such as shopping or remembering to take medications? Independent  Home Banker / Equipment Home Assistive Devices/Equipment: Wheelchair Home Equipment: Wheelchair - power, Bedside commode  Prior Device Use: Indicate devices/aids used by the patient prior to current illness, exacerbation or injury? Motorized wheelchair or scooter  Current Functional Level Cognition  Overall Cognitive Status: Within Functional Limits for tasks assessed Orientation Level: Oriented X4    Extremity Assessment (includes Sensation/Coordination)  Upper Extremity Assessment: Generalized weakness  Lower Extremity Assessment: Defer to PT evaluation RLE Deficits / Details: Did not observe volitional movement in LEs; adequate ROM for her to  pull her leg up, flex her knee to get to her foot to don socks LLE Deficits / Details: Did not observe volitional movement in LEs; adequate ROM for her to pull her leg up, flex her knee to get to her foot to don socks    ADLs  Overall ADL's : Needs assistance/impaired Eating/Feeding: Independent, Sitting, Bed level Eating/Feeding Details (indicate cue type and reason): HOB raised Grooming: Wash/dry hands, Wash/dry face, Min guard, Sitting Upper Body Bathing: Moderate assistance, Sitting Upper Body Bathing Details (indicate cue type and reason): Poor sitting balance Lower Body Bathing: Maximal assistance Upper Body Dressing : Moderate assistance Lower Body Dressing: Total assistance Toilet Transfer: Moderate assistance Toilet Transfer Details (indicate cue type and reason): simulated by lateral scoot Toileting- Clothing Manipulation and Hygiene: Maximal assistance, Sitting/lateral lean  Mobility  Overal bed mobility: Needs Assistance Bed Mobility: Supine to Sit Supine to sit: Mod assist, +2 for physical assistance General bed mobility comments: used rails, increased time and effort due to dizziness.     Transfers  Overall transfer level: Needs assistance Equipment used: None Transfers: Counselling psychologistAnterior-Posterior Transfer Anterior-Posterior transfers: Total assist, +2 physical assistance  Lateral/Scoot Transfers: Mod assist General transfer comment: Pt states she does not put weight on LEs and scoots to chair and pt does not have a drop arm chair therefore performed Anterior posterior transfer into chair today so that pt could sit up.     Ambulation / Gait / Stairs / Wheelchair Mobility  Ambulation/Gait General Gait Details: Pt does not ambulate at baseline    Posture / Balance Dynamic Sitting Balance Sitting balance - Comments: demos  posterior pelvic tilt and required mod A to prevent fall backwards sitting EOB with worsening balance initially after the canalith repositioning manuever.   can sit with min guard assist when not dizzy but needed max assist when dizzy.  Balance Overall balance assessment: Needs assistance Sitting-balance support: Bilateral upper extremity supported Sitting balance-Leahy Scale: Poor Sitting balance - Comments: demos  posterior pelvic tilt and required mod A to prevent fall backwards sitting EOB with worsening balance initially after the canalith repositioning manuever.  can sit with min guard assist when not dizzy but needed max assist when dizzy.  Postural control: Posterior lean    Special needs/care consideration BiPAP/CPAP CPM Continuous Drip IV Dialysis Life Vest Oxygen Special Bed air overlay mattress Trach Size Wound Vac  Skin right sacrum stage 1  Bowel mgmt: LBM 12/14 continent Bladder mgmt: external catheter; urgency at baseline and baseline with [facility] to [facility] hourly at home Diabetic mgmt Behavioral consideration  Chemo/radiation  Designated visitor is son, Onalee HuaDavid   Previous Home Environment  Living Arrangements: Alone  Lives With: Alone Available Help at Discharge: Available PRN/intermittently(son, Onalee HuaDavid intermittent; he gets groceries, runs errands, et) Type of Home: D[employer]House Home Layout: One level Home Access: Ramped entrance Bathroom Shower/Tub: Other (comment) Bathroom Toilet: (BSC welded to her bed which she uses hourly due to urgency) Bathroom Accessibility: No(does not use shower, tub) Home Care Services: No Additional Comments: She has quite a setup that allows her to be independent at wheelchair transfer level  Discharge Living Setting Plans for Discharge Living Setting: Patient's home, Alone Type of Home at Discharge: House Discharge Home Layout: One level Discharge Home Access: Ramped entrance Discharge Bathroom Shower/Tub: (she does not use her bathroom; BSC welded to her bed; sponge) Discharge Bathroom Accessibility: Yes How Accessible: Accessible via wheelchair Does the patient have any problems  obtaining your medications?: No  Social/Family/Support Systems Patient Roles: Parent Contact Information: son, Onalee HuaDavid Anticipated Caregiver: son intermittent only Anticipated Caregiver's Contact Information: see above Ability/Limitations of Caregiver: son prn only; patient has lived alone in this set up for 10 years Caregiver Availability: Intermittent Discharge Plan Discussed with Primary Caregiver: Yes Is Caregiver In Agreement with Plan?: Yes Does Caregiver/Family have Issues with Lodging/Transportation while Pt is in Rehab?: No  Goals/Additional Needs Patient/Family Goal for Rehab: Mod I with PT and OT at wheelchair level Expected length of stay: ELOS 10-14 days Equipment Needs: patietn has very specific DME set up at home Pt/Family Agrees to Admission and willing to participate: Yes Program Orientation Provided & Reviewed with Pt/Caregiver Including Roles  & Responsibilities: Yes  Decrease burden of Care through IP rehab admission:   Possible need for SNF placement upon discharge: patient refuses  Patient Condition: This patient's medical and functional status has changed since the consult dated: 11/29/2019 in which the Rehabilitation Physician determined and documented that the patient's condition is appropriate for intensive rehabilitative care in an inpatient rehabilitation facility. See "History of Present Illness" (above) for medical update. Functional changes are: mod to total assist. Patient's medical and functional status update has been discussed with the Rehabilitation physician and patient remains appropriate for inpatient rehabilitation. Will admit to inpatient rehab today.  Preadmission Screen Completed By:  Clois Dupes, RN, 12/02/2019 10:38 AM ______________________________________________________________________   Discussed status with Dr. Allena Katz on 12/02/2019 at  1050 and received approval for admission today.  Admission Coordinator:  Clois Dupes, time 3846 Date 12/02/2019

## 2019-12-02 NOTE — Plan of Care (Signed)

## 2019-12-02 NOTE — Progress Notes (Signed)
Genice Rouge, MD  Physician  Physical Medicine and Rehabilitation  Consult Note  Signed  Date of Service:  11/29/2019 10:51 AM      Related encounter: ED to Hosp-Admission (Current) from 11/26/2019 in [facility]      Signed      Expand AllCollapse All   Show:Clear all [x] Manual[x] Template[x] Copied  Added by: [x] Angiulli, Mcarthur Rossetti, PA-C[x] Lovorn, Aundra Millet, MD  [] Hover for details          Physical Medicine and Rehabilitation Consult Reason for Consult: Decreased functional mobility Referring Physician: [facility]     HPI: [name] is a 73 y.o. right-handed female with supposed history of multiple sclerosis diagnosed 1982/wheelchair-bound (per pt, also had bad reaction to flu shot prior and also got a "Pan-myelogram" that abruptly afterwards was unable to walk) and last seen by neurology service Dr. Marjory Lies, lumbar disc surgery May 2008 mitral valve prolapse.  Per chart review lives alone.  Her son brings her groceries.  1 level home with ramped entrance.  Reportedly independent wheelchair level.  She propels to sink to sponge bathe and mostly microwaves her meals.  Patient presented 11/27/2019 with increasing dizziness over the past few weeks.  Admission chemistries with urinalysis negative nitrite, WBC 14,300, SARS coronavirus negative.  Cranial CT scan/MRI showed negative for acute infarct.  Periventricular deep white matter hyperintensities compatible multiple sclerosis.  There were no prior MRIs for comparison purposes.  Neurology follow-up placed on Solu-Medrol 1 g x 3 days for suspected MS exacerbation.  Subcutaneous Lovenox for DVT prophylaxis.  Tolerating a regular diet.  Therapy evaluation completed with recommendations of physical medicine rehab consult.     Pt feels the only reason she needs to come to rehab  Is to "get rid of the dizziness"- she doesn't want to come if either we try to send her to nursing home, say she can't d/c  home on her own, or cannot "fix" her dizziness. If we think we CAN fix her dizziness, she's willing to come, but only with those concerns in mind. Says she denies constipation- goes "regularly"- Usually gets UTI ~ 1x/year- on Methamine or something like that o prevent UTIs.  Slides over for transfer without SB- but uses SB for car transfers lviing by self for 10-12 years minimum in w/c- has power w/c-  Said not sure if the vestibular therapy they did yesterday was helpful, however less vertigo today. Also notes vision blurry close up and really far away- hasn't seen eye doctor since 1970s.       Review of Systems  Constitutional: Negative for chills and fever.  HENT: Negative for hearing loss.   Eyes: Negative for blurred vision and double vision.  Respiratory: Negative for cough and shortness of breath.   Cardiovascular: Positive for leg swelling. Negative for chest pain and palpitations.  Gastrointestinal: Positive for constipation. Negative for heartburn, nausea and vomiting.  Genitourinary:       Neurogenic bladder as well as bowel  Skin: Negative for rash.  Neurological: Positive for dizziness and weakness.       Spasticity  All other systems reviewed and are negative.       Past Medical History:  Diagnosis Date  . Ankle fracture, left 07/2006  . Ankle fracture, right 10/30/06    [facility]  . Compression fracture 2011    Dr. Dion Saucier   . Disc herniation      L5, S1  . MS (multiple sclerosis) (HCC)  evaluated by Dr. Erling Cruz  . MVP (mitral valve prolapse) 1979    ECHO. Trace MR, trace TR, trace PR 07/17/03  . Nephrolithiasis    . Osteoporosis    . Rib fractures 1998, [id number]    2  . Toe fracture 1998    5         Past Surgical History:  Procedure Laterality Date  . LUMBAR DISC SURGERY   04/2007    L5, S1- [facility]  . SEPTOPLASTY   1983    deviated septum  . TONSILLECTOMY AND ADENOIDECTOMY   1960  . TOTAL ABDOMINAL HYSTERECTOMY W/ BILATERAL SALPINGOOPHORECTOMY   1982    for  endometriosis         Family History  Problem Relation Age of Onset  . Stroke Mother    . Neuropathy Brother    . Stroke Father    . Hypertension Neg Hx    . Diabetes Neg Hx    . Depression Neg Hx    . Alcohol abuse Neg Hx    . Drug abuse Neg Hx      Social History:  reports that she has never smoked. She has never used smokeless tobacco. She reports that she does not drink alcohol or use drugs. Allergies:       Allergies  Allergen Reactions  . Procaine Hcl        REACTION: UNSPECIFIED          Medications Prior to Admission  Medication Sig Dispense Refill  . methenamine (HIPREX) 1 g tablet Take 1 g by mouth daily.          Home: Home Living Family/patient expects to be discharged to:: Private residence Living Arrangements: Alone Available Help at Discharge: Family, Other (Comment)(son brings groceries) Type of Home: House Home Access: [place]: One level Bathroom Shower/Tub: Other (comment)(sponge bathes) Home Equipment: Wheelchair - power, Bedside commode Additional Comments: She has quite a setup that allows her to be independent at wheelchair transfer level  Functional History: Prior Function Level of Independence: Independent with assistive device(s) Comments: Independent at power chair transfer level; propels to sink to sponge bathe; mostly microwave meals; her BSC is welded to her bed so she doesn't worry about it sliding Functional Status:  Mobility: Bed Mobility Overal bed mobility: Needs Assistance Bed Mobility: Supine to Sit Supine to sit: Mod assist, +2 for physical assistance General bed mobility comments: used rails, increased time and effort due to dizziness.  Transfers Overall transfer level: Needs assistance Equipment used: None Transfers: Government social research officer transfers: Total assist, +2 physical assistance  Lateral/Scoot Transfers: Mod assist General transfer comment: Pt states she does not put  weight on LEs and scoots to chair and pt does not have a drop arm chair therefore performed Anterior posterior transfer into chair today so that pt could sit up.  Ambulation/Gait General Gait Details: Pt does not ambulate at baseline   ADL: ADL Overall ADL's : Needs assistance/impaired Eating/Feeding: Independent, Sitting, Bed level Eating/Feeding Details (indicate cue type and reason): HOB raised Grooming: Wash/dry hands, Wash/dry face, Min guard, Sitting Upper Body Bathing: Moderate assistance, Sitting Upper Body Bathing Details (indicate cue type and reason): Poor sitting balance Lower Body Bathing: Maximal assistance Upper Body Dressing : Moderate assistance Lower Body Dressing: Total assistance Toilet Transfer: Moderate assistance Toilet Transfer Details (indicate cue type and reason): simulated by lateral scoot Toileting- Clothing Manipulation and Hygiene: Maximal assistance, Sitting/lateral lean   Cognition: Cognition Overall Cognitive Status: Within Functional Limits  for tasks assessed Orientation Level: Oriented X4 Cognition Arousal/Alertness: Awake/alert Behavior During Therapy: WFL for tasks assessed/performed Overall Cognitive Status: Within Functional Limits for tasks assessed   Blood pressure 129/66, pulse (!) 59, temperature 97.8 F (36.6 C), temperature source Oral, resp. rate 15, height 5\' 3"  (1.6 m), weight 51.3 kg, SpO2 99 %. Physical Exam  Nursing note and vitals reviewed. Constitutional: She appears well-developed and well-nourished.  Sitting up in bed in chair at bedside; small elderly female but with big spirit, has purewick, NAD  Notes that she needs to go to bathroom at home every 1 hour- and literally transfers to bed/BSC q 1 HOUR.   HENT:  Head: Normocephalic and atraumatic.  Mouth/Throat: No oropharyngeal exudate.  No facial asymmetry and facial sensation intact  Eyes: Conjunctivae and EOM are normal. No scleral icterus.  Grossly, no nystagmus,  however didn't move her around to trigger it  Neck: No tracheal deviation present.  Cardiovascular:  RRR  Respiratory:  CTA B/L no W/R/R  GI:  Soft, NT, ND,  hypoactive BS  Genitourinary:    Genitourinary Comments: Has purewick with dark amber urine in container.    Musculoskeletal:     Cervical back: Normal range of motion and neck supple.     Comments: UEs at least 4+/5 in deltoids, biceps, triceps, WE, grip and finger abd B/L  LEs- RLE HF- 2-/5, KE 1/5- otherwise was not able to see ANY movement in LEs B/L- and pt attempted for significant period of time, per her.   Neurological:  Patient is alert in no acute distress oriented x3.  Cooperative with exam.  Follows commands.  Fair medical historian. Foot drop B/L No significant spasticity seen in LEs B/L- which doesn't match most MS patients  Skin: Skin is warm and dry.  IV in R forearm  Psychiatric: She has a normal mood and affect.      Lab Results Last 24 Hours  No results found for this or any previous visit (from the past 24 hour(s)).   Imaging Results (Last 48 hours)  No results found.           Assessment/Plan: Diagnosis: severe vertigo in w/c bound patient 1. Does the need for close, 24 hr/day medical supervision in concert with the patient's rehab needs make it unreasonable for this patient to be served in a less intensive setting? Potentially 2. Co-Morbidities requiring supervision/potential complications: w/c bound- questionable MS as cause, no neurogenic bowel and bladder per pt; mild constipation, otolith needs repositioning, voids q1 hour Due to bladder management, safety, skin/wound care, disease management and patient education, does the patient require 24 hr/day rehab nursing? Potentially 3. Does the patient require coordinated care of a physician, rehab nurse, therapy disciplines of PT and OT to address physical and functional deficits in the context of the above medical diagnosis(es)? Yes Addressing deficits  in the following areas: strength, transferring, bowel/bladder control, bathing, dressing, grooming and toileting 4. Can the patient actively participate in an intensive therapy program of at least 3 hrs of therapy per day at least 5 days per week? Yes 5. The potential for patient to make measurable gains while on inpatient rehab is good 6. Anticipated functional outcomes upon discharge from inpatient rehab are modified independent  with PT, modified independent with OT, n/a with SLP. 7. Estimated rehab length of stay to reach the above functional goals is: 5-7 days 8. Anticipated discharge destination: Home 9. Overall Rehab/Functional Prognosis: good   RECOMMENDATIONS: This patient's condition is appropriate  for continued rehabilitative care in the following setting: CIR vs home Patient has agreed to participate in recommended program. Potentially Note that insurance prior authorization may be required for reimbursement for recommended care.   Comment:  1. Patient is EMPHATIC that if we cannot help her dizziness/vertigo in rehab, she doesn't want to come to CIR and wants the [facility], not before or after that. I explained the rules and regs of rehab- she wasn't happy with that- will have to come to compromise if she comes to rehab- she's THINKING about it at this time.   2. Suggest having vestibular PT reassess pt and attempt to fix otoliths- pt might be able to go home from acute- she's EMPHATIC that's if she was at home, she's at the level of function that she could do everything herself, just needs her own set-up.  Also, she doesn't want to leave the hospital until "Fixed" since she rarely uses medical care, per pt.    Spent a total of 50 minutes with pt in direct care, in addition to type up consult.  Mcarthur RossettiDaniel J Angiulli, PA-C 11/29/2019        Revision History      Routing History

## 2019-12-02 NOTE — Progress Notes (Signed)
Marcello Fennel, MD  Physician  Physical Medicine and Rehabilitation  PMR Pre-admission  Addendum  Date of Service:  12/02/2019 10:38 AM      Related encounter: ED to Hosp-Admission (Current) from 11/26/2019 in [facility]        Show:Clear all [x] Manual[x] Template[x] Copied  Added by: [x] , RN[x] , MD  [] Hover for details PMR Admission Coordinator Pre-Admission Assessment   Patient: [name] is an 73 y.o., female MRN: DOB: 1946-11-14 Height: 5\' 3"  (160 cm) Weight: 51.3 kg                                                                                                                                                  Insurance Information HMO:     PPO: yes     PCP:      IPA:      80/20:      OTHER:  PRIMARY: [employer] Medicare      Policy#: [id number]      Subscriber: pt CM Name: 65      Phone#: [phone]     Fax#: portal Pre-Cert#: 04/06/1946      Employer:  Benefits:  Phone #: [phone]     Name: 12/16 Eff. Date: 12/15/2018     Deduct: none      Out of Pocket Max: $4200      Life Max: none CIR: $250 co pay per day days 1 until 6      SNF: no co pay per days days 1 until 20; $178 co pay per day days 21 until 100 Outpatient: $35 per visit     Co-Pay: visits per medical neccesity Home Health: 100%      Co-Pay: visits per medical neccesity DME: 80%     Co-Pay: 20% Providers: in network  SECONDARY: none         Medicaid Application Date:       Case Manager:  Disability Application Date:       Case Worker:    The "Data Collection Information Summary" for patients in Inpatient Rehabilitation Facilities with attached "Privacy Act Statement-Health Care Records" was provided and verbally reviewed with: Patient   Emergency Contact Information         Contact Information     Name Relation Home Work Mobile    Argo Son     [phone]       Current Medical History    Patient Admitting Diagnosis: debility with multiple sclerosis and vertigo   History of Present Illness:73 years old female with history of multiple sclerosis diagnosed 1982 and was last seen by neurology services Dr. Marindia in 2014, lumbar disc surgery, mitral valve prolapse.  Patient is maintained only on Mandelamine for recurrent UTIs. Presented 11/27/2019 with increasing dizziness over the past few days.  Admission chemistries with  urinalysis negative nitrite, WBC 14,300, SARS coronavirus negative.  Cranial CT scan and MRI showed negative for acute infarction.  Periventricular deep white matter hyper intensities compatible with multiple sclerosis.  There were no prior MRIs for comparison purposes.  Neurology follow-up placed on Solu-Medrol 1 g x 3 days for suspected MS exacerbation.  Subcutaneous Lovenox for DVT prophylaxis.  Tolerating regular diet.   Past Medical History      Past Medical History:  Diagnosis Date  . Ankle fracture, left 07/2006  . Ankle fracture, right 10/30/06    [facility]  . Compression fracture 2011    Dr. Dion SaucierLandau   . Disc herniation      L5, S1  . MS (multiple sclerosis) (HCC)      evaluated by Dr. Sandria ManlyLove  . MVP (mitral valve prolapse) 1979    ECHO. Trace MR, trace TR, trace PR 07/17/03  . Nephrolithiasis    . Osteoporosis    . Rib fractures 1998, [id number]    2  . Toe fracture 1998    5      Family History  family history includes Neuropathy in her brother; Stroke in her father and mother.   Prior Rehab/Hospitalizations:  Has the patient had prior rehab or hospitalizations prior to admission? Yes   Has the patient had major surgery during 100 days prior to admission? No   Current Medications    Current Facility-Administered Medications:  .  acetaminophen (TYLENOL) tablet 650 mg, 650 mg, Oral, Q6H PRN **OR** acetaminophen (TYLENOL) suppository 650 mg, 650 mg, Rectal, Q6H PRN, Crosley, Debby, MD .  enoxaparin (LOVENOX) injection 40 mg, 40 mg, Subcutaneous,  Q24H, Crosley, Debby, MD, 40 mg at 12/02/19 0511 .  meclizine (ANTIVERT) tablet 25 mg, 25 mg, Oral, TID PRN, Lanae BoastKc, Ramesh, MD, 25 mg at 12/01/19 0956 .  methenamine (MANDELAMINE) tablet 1 g, 1 g, Oral, Daily, Crosley, Debby, MD, 1 g at 12/02/19 0910 .  ondansetron (ZOFRAN) tablet 4 mg, 4 mg, Oral, Q6H PRN **OR** ondansetron (ZOFRAN) injection 4 mg, 4 mg, Intravenous, Q6H PRN, Crosley, Debby, MD .  polyethylene glycol (MIRALAX / GLYCOLAX) packet 17 g, 17 g, Oral, Daily PRN, Crosley, Debby, MD .  vitamin B-12 (CYANOCOBALAMIN) tablet 250 mcg, 250 mcg, Oral, Daily, Kyle, Tyrone A, DO, 250 mcg at 12/02/19 0909   Patients Current Diet:     Diet Order                      Diet - low sodium heart healthy           Diet Heart Room service appropriate? Yes; Fluid consistency: Thin  Diet effective now                   Precautions / Restrictions Precautions Precautions: Fall Restrictions Weight Bearing Restrictions: No    Has the patient had 2 or more falls or a fall with injury in the past year?No   Prior Activity Level Limited Community (1-2x/wk): Mod I at wheelchair level; does not drive   Prior Functional Level Prior Function Level of Independence: Independent with assistive device(s) Comments: Independent at power chair transfer level; propels to sink to sponge bathe; mostly microwave meals; her BSC is welded to her bed so she doesn't worry about it sliding   Self Care: Did the patient need help bathing, dressing, using the toilet or eating?  Independent   Indoor Mobility: Did the patient need assistance with walking from room to room (with or without  device)? Independent   Stairs: Did the patient need assistance with internal or external stairs (with or without device)? Independent   Functional Cognition: Did the patient need help planning regular tasks such as shopping or remembering to take medications? Independent   Home BankerAssistive Devices / Equipment Home Assistive  Devices/Equipment: Wheelchair Home Equipment: Wheelchair - power, Bedside commode   Prior Device Use: Indicate devices/aids used by the patient prior to current illness, exacerbation or injury? Motorized wheelchair or scooter   Current Functional Level Cognition   Overall Cognitive Status: Within Functional Limits for tasks assessed Orientation Level: Oriented X4    Extremity Assessment (includes Sensation/Coordination)   Upper Extremity Assessment: Generalized weakness  Lower Extremity Assessment: Defer to PT evaluation RLE Deficits / Details: Did not observe volitional movement in LEs; adequate ROM for her to pull her leg up, flex her knee to get to her foot to don socks LLE Deficits / Details: Did not observe volitional movement in LEs; adequate ROM for her to pull her leg up, flex her knee to get to her foot to don socks     ADLs   Overall ADL's : Needs assistance/impaired Eating/Feeding: Independent, Sitting, Bed level Eating/Feeding Details (indicate cue type and reason): HOB raised Grooming: Wash/dry hands, Wash/dry face, Min guard, Sitting Upper Body Bathing: Moderate assistance, Sitting Upper Body Bathing Details (indicate cue type and reason): Poor sitting balance Lower Body Bathing: Maximal assistance Upper Body Dressing : Moderate assistance Lower Body Dressing: Total assistance Toilet Transfer: Moderate assistance Toilet Transfer Details (indicate cue type and reason): simulated by lateral scoot Toileting- Clothing Manipulation and Hygiene: Maximal assistance, Sitting/lateral lean     Mobility   Overal bed mobility: Needs Assistance Bed Mobility: Supine to Sit Supine to sit: Mod assist, +2 for physical assistance General bed mobility comments: used rails, increased time and effort due to dizziness.      Transfers   Overall transfer level: Needs assistance Equipment used: None Transfers: Counselling psychologistAnterior-Posterior Transfer Anterior-Posterior transfers: Total assist, +2  physical assistance  Lateral/Scoot Transfers: Mod assist General transfer comment: Pt states she does not put weight on LEs and scoots to chair and pt does not have a drop arm chair therefore performed Anterior posterior transfer into chair today so that pt could sit up.      Ambulation / Gait / Stairs / Wheelchair Mobility   Ambulation/Gait General Gait Details: Pt does not ambulate at baseline     Posture / Balance Dynamic Sitting Balance Sitting balance - Comments: demos  posterior pelvic tilt and required mod A to prevent fall backwards sitting EOB with worsening balance initially after the canalith repositioning manuever.  can sit with min guard assist when not dizzy but needed max assist when dizzy.  Balance Overall balance assessment: Needs assistance Sitting-balance support: Bilateral upper extremity supported Sitting balance-Leahy Scale: Poor Sitting balance - Comments: demos  posterior pelvic tilt and required mod A to prevent fall backwards sitting EOB with worsening balance initially after the canalith repositioning manuever.  can sit with min guard assist when not dizzy but needed max assist when dizzy.  Postural control: Posterior lean     Special needs/care consideration BiPAP/CPAP CPM Continuous Drip IV Dialysis Life Vest Oxygen Special Bed air overlay mattress Trach Size Wound Vac  Skin right sacrum stage 1  Bowel mgmt: LBM 12/14 continent Bladder mgmt: external catheter; urgency at baseline and baseline with transfers to [facility] hourly at home Diabetic mgmt Behavioral consideration  Chemo/radiation  Designated visitor is son, Onalee HuaDavid  Previous Home Environment  Living Arrangements: Alone  Lives With: Alone Available Help at Discharge: Available PRN/intermittently(son, Shanon Brow intermittent; he gets groceries, runs errands, et) Type of Home: [employer] Home Layout: One level Home Access: Ramped entrance Bathroom Shower/Tub: Other (comment) Bathroom Toilet: (BSC welded  to her bed which she uses hourly due to urgency) Bathroom Accessibility: No(does not use shower, tub) Home Care Services: No Additional Comments: She has quite a setup that allows her to be independent at wheelchair transfer level   Discharge Living Setting Plans for Discharge Living Setting: Patient's home, Alone Type of Home at Discharge: House Discharge Home Layout: One level Discharge Home Access: [place] entrance Discharge Bathroom Shower/Tub: (she does not use her bathroom; BSC welded to her bed; sponge) Discharge Bathroom Accessibility: Yes How Accessible: Accessible via wheelchair Does the patient have any problems obtaining your medications?: No   Social/Family/Support Systems Patient Roles: Parent Contact Information: son, Shanon Brow Anticipated Caregiver: son intermittent only Anticipated Caregiver's Contact Information: see above Ability/Limitations of Caregiver: son prn only; patient has lived alone in this set up for 10 years Caregiver Availability: Intermittent Discharge Plan Discussed with Primary Caregiver: Yes Is Caregiver In Agreement with Plan?: Yes Does Caregiver/Family have Issues with Lodging/Transportation while Pt is in Rehab?: No   Goals/Additional Needs Patient/Family Goal for Rehab: Mod I with PT and OT at wheelchair level Expected length of stay: ELOS 10-14 days Equipment Needs: patietn has very specific DME set up at home Pt/Family Agrees to Admission and willing to participate: Yes Program Orientation Provided & Reviewed with Pt/Caregiver Including Roles  & Responsibilities: Yes   Decrease burden of Care through IP rehab admission:    Possible need for SNF placement upon discharge: patient refuses   Patient Condition: This patient's medical and functional status has changed since the consult dated: 11/29/2019 in which the Rehabilitation Physician determined and documented that the patient's condition is appropriate for intensive rehabilitative care in an  inpatient rehabilitation facility. See "History of Present Illness" (above) for medical update. Functional changes are: mod to total assist. Patient's medical and functional status update has been discussed with the Rehabilitation physician and patient remains appropriate for inpatient rehabilitation. Will admit to inpatient rehab today.   Preadmission Screen Completed By:  Cleatrice Burke, RN, 12/02/2019 10:38 AM ______________________________________________________________________   Discussed status with Dr. Posey Pronto on 12/02/2019 at  53 and received approval for admission today.   Admission Coordinator:  Cleatrice Burke, time 2409 Date 12/02/2019     Revision History

## 2019-12-02 NOTE — Discharge Summary (Signed)
Physician Discharge Summary  Arsenio LoaderDenzel D Nicotra OZH:[id number]:[id number] DOB: 1945-12-21 DOA: 11/26/2019  PCP: Karie SchwalbeLetvak, Richard I, MD  Admit date: 11/26/2019 Discharge date: 12/02/2019  Admitted From: home Disposition:  Inpatient rehab  Recommendations for Outpatient Follow-up:  1. Follow up with PCP in 1-2 weeks 2. Please obtain BMP/CBC in one week 3. Please follow up on the following pending results:  Home Health: n/a  Equipment/Devices:n/a   Discharge Condition:stable  CODE STATUS:full code  Diet recommendation: no restrictions  Brief/Interim Summary: HPI/Recap of past 7824 hours: 73 year old female with past medical history of multiple sclerosis wheelchair-bound, dyslipidemia,anemia presented with complaint of dizziness. This patient got up in the morning went to the bathroom on completing her ADLs found she could not sit up because she became very dizzy, lasting all day came to the ER also was complaining of nausea and gagging but did not vomit. subacute vertigo in the setting of MS. Haze JustinLujan was admitted, MRI brain showed 2 small foci of contrast-enhancement consistent with active demyelination within the periventricular white matter. Patient is scheduled to have 3doses of solumedrol 1 gm  12/02/19:  Continues with intermittent vertigo.  Approved for inpatient rehab -patient stable for discharge to inpatient rehab today.  She does have impaired vision and will need appointment with ophthalmologist or optometrist in the outpatient setting.   Multiple sclerosis exacerbation MRI brain 11/27/2019. Seen by neurology Completed 3 doses of IV Solu-Medrol Advised to follow-up with neurology outpatient with Dr. Epimenio FootSater of [facility]  Motion related dizziness/vertigo Continue meclizine To inpatient rehab today  Anemia of chronic disease Hemoglobin stable  Paraplegia, wheelchair-bound No acute issues  Recurrent UTI On methenamine, continue  Pressure ulcer, present on admission Continue  local wound care  Discharge Diagnoses:  Principal Problem:   Multiple sclerosis exacerbation (HCC) Active Problems:   Dyslipidemia   Paraplegia, unspecified (HCC)   Pressure injury of skin    Discharge Instructions  Discharge Instructions    Ambulatory referral to Neurology   Complete by: As directed    An appointment is requested in approximately: 2 weeks   Call MD for:  difficulty breathing, headache or visual disturbances   Complete by: As directed    Call MD for:  extreme fatigue   Complete by: As directed    Call MD for:  hives   Complete by: As directed    Call MD for:  persistant dizziness or light-headedness   Complete by: As directed    Call MD for:  persistant nausea and vomiting   Complete by: As directed    Call MD for:  redness, tenderness, or signs of infection (pain, swelling, redness, odor or green/yellow discharge around incision site)   Complete by: As directed    Call MD for:  severe uncontrolled pain   Complete by: As directed    Call MD for:  temperature >100.4   Complete by: As directed    Diet - low sodium heart healthy   Complete by: As directed    Increase activity slowly   Complete by: As directed      Allergies as of 12/02/2019      Reactions   Procaine Hcl    REACTION: UNSPECIFIED      Medication List    TAKE these medications   meclizine 25 MG tablet Commonly known as: ANTIVERT Take 1 tablet (25 mg total) by mouth 3 (three) times daily as needed for dizziness.   methenamine 1 g tablet Commonly known as: HIPREX Take 1 g by mouth daily.  promethazine 25 MG tablet Commonly known as: PHENERGAN Take 1 tablet (25 mg total) by mouth every 6 (six) hours as needed for nausea or vomiting.            Durable Medical Equipment  (From admission, onward)         Start     Ordered   12/01/19 1103  For home use only DME Air overlay mattress  Once     12/01/19 1102         Follow-up Information    Venia Carbon, MD.    Specialties: Internal Medicine, Pediatrics Contact information: [place] [address] [phone]        [facility].   Specialty: Neurology Why: with neurology for MS Contact information: [address] [place]       Melida Quitter, MD.   Specialty: Otolaryngology Why: with ENT for vertigo Contact information: [address] [place] [address] [phone]          Allergies  Allergen Reactions  . Procaine Hcl     REACTION: UNSPECIFIED    Consultations:  Neurology   Procedures/Studies: CT Head Wo Contrast  Result Date: 11/26/2019 CLINICAL DATA:  Dizziness and weakness for 3 weeks EXAM: CT HEAD WITHOUT CONTRAST TECHNIQUE: Contiguous axial images were obtained from the base of the skull through the vertex without intravenous contrast. COMPARISON:  None. FINDINGS: Brain: Brain atrophy noted with white matter microvascular ischemic changes about the lateral ventricle throughout both cerebral hemispheres. No acute intracranial hemorrhage definite new infarction mass lesion, midline shift, herniation, hydrocephalus, extra-axial fluid collection. No focal mass effect or edema. Cisterns are patent. No cerebellar abnormality. Vascular: Intracranial atherosclerosis noted.  No hyperdense vessel. Skull: Normal. Negative for fracture or focal lesion. Sinuses/Orbits: No acute finding. Other: None. IMPRESSION: Brain atrophy and chronic white matter microvascular changes. No acute intracranial abnormality by noncontrast CT. Electronically Signed   By: Jerilynn Mages.  Shick M.D.   On: 11/26/2019 11:37   MR BRAIN WO CONTRAST  Result Date: 11/26/2019 CLINICAL DATA:  Focal neurologic deficit. Suspect stroke. History of multiple sclerosis. EXAM: MRI HEAD WITHOUT CONTRAST TECHNIQUE: Multiplanar, multiecho pulse sequences of the brain and surrounding structures were obtained without  intravenous contrast. COMPARISON:  CT head 11/26/2019.  No prior MRI for comparison. FINDINGS: Brain: Negative for acute infarct. Numerous periventricular white matter hyperintensities. Pattern consistent with multiple sclerosis. Hyperintensities extend into the deep white matter in the parietal lobe bilaterally. Brainstem and cerebellum intact. Cerebral volume mildly diminished but normal for age. Negative for hydrocephalus. Negative for hemorrhage or mass. Vascular: Normal arterial flow voids Skull and upper cervical spine: Negative Sinuses/Orbits: Negative Other: None IMPRESSION: Negative for acute infarct Periventricular deep white matter hyperintensities compatible with multiple sclerosis. No prior MRI for comparison purposes. Electronically Signed   By: Franchot Gallo M.D.   On: 11/26/2019 17:15   MR BRAIN W CONTRAST  Result Date: 11/27/2019 CLINICAL DATA:  Peripheral vertigo EXAM: MRI HEAD WITH CONTRAST TECHNIQUE: Multiplanar, multiecho pulse sequences of the brain and surrounding structures were obtained with intravenous contrast. CONTRAST:  48mL GADAVIST GADOBUTROL 1 MMOL/ML IV SOLN COMPARISON:  Brain MRI 11/26/2019 FINDINGS: There are 2 subtle foci of contrast enhancement within the periventricular white matter. The first is on the right, and the periatrial region (series 12, image 9). The second is on the left, near the superior left lateral ventricle (series 13, image 21). No other abnormal contrast enhancement.  IMPRESSION: Two small foci of contrast enhancement consistent with active demyelination within the periventricular white matter. Electronically Signed   By: Deatra RobinsonKevin  Herman M.D.   On: 11/27/2019 01:04   DG Chest Portable 1 View  Result Date: 11/26/2019 CLINICAL DATA:  Pt in from home via [facility] with c/o dizziness and weakness x 3 wks. EXAM: PORTABLE CHEST 1 VIEW COMPARISON:  Chest radiograph 04/23/2007 FINDINGS: The heart size and mediastinal contours are within normal limits. Aortic arch  calcification. The lungs are clear bilaterally. No pneumothorax or pleural effusion. No acute finding in the visualized skeleton. IMPRESSION: 1. No acute cardiopulmonary process. 2. Aortic atherosclerosis. Electronically Signed   By: Emmaline KluverNancy  Ballantyne M.D.   On: 11/26/2019 11:38       Subjective:   Discharge Exam: Vitals:   12/02/19 0307 12/02/19 0823  BP: (!) 102/58 114/62  Pulse: 62 66  Resp: 16 18  Temp: 98.1 F (36.7 C) 98.5 F (36.9 C)  SpO2: 95% 97%   Vitals:   12/01/19 1444 12/01/19 1954 12/02/19 0307 12/02/19 0823  BP: 122/66 113/62 (!) 102/58 114/62  Pulse: 64 70 62 66  Resp: 14 17 16 18   Temp: 98.3 F (36.8 C) 98.2 F (36.8 C) 98.1 F (36.7 C) 98.5 F (36.9 C)  TempSrc: Oral Oral Oral Oral  SpO2: 98% 95% 95% 97%  Weight:      Height:        General: Pt is alert, awake, not in acute distress Cardiovascular: RRR, S1/S2 +, no rubs, no gallops Respiratory: CTA bilaterally, no wheezing, no rhonchi Abdominal: Soft, NT, ND, bowel sounds + Extremities: no edema, no cyanosis    The results of significant diagnostics from this hospitalization (including imaging, microbiology, ancillary and laboratory) are listed below for reference.     Microbiology: Recent Results (from the past 240 hour(s))  SARS CORONAVIRUS 2 (TAT 6-24 HRS) Nasopharyngeal Nasopharyngeal Swab     Status: None   Collection Time: 11/26/19 11:45 AM   Specimen: Nasopharyngeal Swab  Result Value Ref Range Status   SARS Coronavirus 2 NEGATIVE NEGATIVE Final    Comment: (NOTE) SARS-CoV-2 target nucleic acids are NOT DETECTED. The SARS-CoV-2 RNA is generally detectable in upper and lower respiratory specimens during the acute phase of infection. Negative results do not preclude SARS-CoV-2 infection, do not rule out co-infections with other pathogens, and should not be used as the sole basis for treatment or other patient management decisions. Negative results must be combined with clinical  observations, patient history, and epidemiological information. The expected result is Negative. Fact Sheet for Patients: HairSlick.nohttps://www.fda.gov/media/138098/download Fact Sheet for Healthcare Providers: quierodirigir.comhttps://www.fda.gov/media/138095/download This test is not yet approved or cleared by the [place] FDA and  has been authorized for detection and/or diagnosis of SARS-CoV-2 by FDA under an Emergency Use Authorization (EUA). This EUA will remain  in effect (meaning this test can be used) for the duration of the COVID-19 declaration under Section 56 4(b)(1) of the Act, 21 U.S.C. section 360bbb-3(b)(1), unless the authorization is terminated or revoked sooner. Performed at [facility], [address] [place], [place] [address]      Labs: BNP (last 3 results) No results for input(s): BNP in the last 8760 hours. Basic Metabolic Panel: Recent Labs  Lab 11/26/19 1058 11/27/19 0827 11/28/19 0309  NA 139 143 140  K 3.9 4.0 4.3  CL 107 114* 109  CO2 23 21* 23  GLUCOSE 126* 136* 136*  BUN 13 7* 19  CREATININE 0.60 0.53 0.71  CALCIUM 8.4* 8.8* 8.6*  MG  --   --  2.0  PHOS  --  2.5 2.2*   Liver Function Tests: Recent Labs  Lab 11/26/19 1058 11/27/19 0827 11/28/19 0309  AST 20  --   --   ALT 18  --   --   ALKPHOS 64  --   --   BILITOT 0.8  --   --   PROT 6.1*  --   --   ALBUMIN 3.5 3.1* 2.7*   No results for input(s): LIPASE, AMYLASE in the last 168 hours. No results for input(s): AMMONIA in the last 168 hours. CBC: Recent Labs  Lab 11/26/19 1058 11/27/19 0827 11/28/19 0309  WBC 14.3* 5.8 9.4  NEUTROABS 13.2* 5.2 7.2  HGB 13.1 12.8 11.4*  HCT 40.2 38.3 34.1*  MCV 95.3 93.9 93.7  PLT 344 328 331   Cardiac Enzymes: No results for input(s): CKTOTAL, CKMB, CKMBINDEX, TROPONINI in the last 168 hours. BNP: Invalid input(s): POCBNP CBG: No results for input(s): GLUCAP in the last 168 hours. D-Dimer No results for input(s): DDIMER in the last 72  hours. Hgb A1c No results for input(s): HGBA1C in the last 72 hours. Lipid Profile No results for input(s): CHOL, HDL, LDLCALC, TRIG, CHOLHDL, LDLDIRECT in the last 72 hours. Thyroid function studies No results for input(s): TSH, T4TOTAL, T3FREE, THYROIDAB in the last 72 hours.  Invalid input(s): FREET3 Anemia work up No results for input(s): VITAMINB12, FOLATE, FERRITIN, TIBC, IRON, RETICCTPCT in the last 72 hours. Urinalysis    Component Value Date/Time   COLORURINE STRAW (A) 11/26/2019 1046   APPEARANCEUR CLEAR 11/26/2019 1046   LABSPEC 1.010 11/26/2019 1046   PHURINE 7.0 11/26/2019 1046   GLUCOSEU 50 (A) 11/26/2019 1046   HGBUR NEGATIVE 11/26/2019 1046   BILIRUBINUR NEGATIVE 11/26/2019 1046   BILIRUBINUR neg 07/20/2018 1608   KETONESUR 20 (A) 11/26/2019 1046   PROTEINUR NEGATIVE 11/26/2019 1046   UROBILINOGEN 0.2 07/20/2018 1608   UROBILINOGEN 0.2 05/26/2007 2307   NITRITE NEGATIVE 11/26/2019 1046   LEUKOCYTESUR NEGATIVE 11/26/2019 1046   Sepsis Labs Invalid input(s): PROCALCITONIN,  WBC,  LACTICIDVEN Microbiology Recent Results (from the past 240 hour(s))  SARS CORONAVIRUS 2 (TAT 6-24 HRS) Nasopharyngeal Nasopharyngeal Swab     Status: None   Collection Time: 11/26/19 11:45 AM   Specimen: Nasopharyngeal Swab  Result Value Ref Range Status   SARS Coronavirus 2 NEGATIVE NEGATIVE Final    Comment: (NOTE) SARS-CoV-2 target nucleic acids are NOT DETECTED. The SARS-CoV-2 RNA is generally detectable in upper and lower respiratory specimens during the acute phase of infection. Negative results do not preclude SARS-CoV-2 infection, do not rule out co-infections with other pathogens, and should not be used as the sole basis for treatment or other patient management decisions. Negative results must be combined with clinical observations, patient history, and epidemiological information. The expected result is Negative. Fact Sheet for  Patients: HairSlick.no Fact Sheet for Healthcare Providers: quierodirigir.com This test is not yet approved or cleared by the [place] FDA and  has been authorized for detection and/or diagnosis of SARS-CoV-2 by FDA under an Emergency Use Authorization (EUA). This EUA will remain  in effect (meaning this test can be used) for the duration of the COVID-19 declaration under Section 56 4(b)(1) of the Act, 21 U.S.C. section 360bbb-3(b)(1), unless the authorization is terminated or revoked sooner. Performed at [facility], [address], [place], [place] [address]      Time coordinating discharge: Over 30  minutes  SIGNED:   Levie Heritage, DO [facility] 12/02/2019, 10:31 AM

## 2019-12-02 NOTE — Care Management Important Message (Signed)
Important Message  Patient Details  Name: [name] MRN: [id number] Date of Birth: 1946-02-05   Medicare Important Message Given:  Yes     Memory Argue 12/02/2019, 12:55 PM

## 2019-12-02 NOTE — Progress Notes (Signed)
Report given to [facility], RN, Pt not in distress, to transfer to 4MW rm#[id number].

## 2019-12-02 NOTE — H&P (Signed)
Physical Medicine and Rehabilitation Admission H&P    Chief Complaint  Patient presents with  . Dizziness  . Weakness  : HPI: [name] is a 73 year old right-handed female with history of multiple sclerosis diagnosed 1982 and was last seen by neurology services Dr. Levada Dy in 2014, lumbar disc surgery, mitral valve prolapse.  History taken from chart review and patient.  Patient is maintained only on Mandelamine for recurrent UTIs.  Per chart review patient lives alone.  Her son brings her groceries.  She is essentially wheelchair-bound.  1 level home with ramped entrance.  She propels to sink to sponge bathe and mostly microwaves her meals.  Presented on 11/27/2019 with creasing dizziness.  Admission chemistries with urinalysis negative nitrite, WBC 14,300, SARS coronavirus negative.  Cranial CT scan and MRI were unremarkable for acute intracranial process.  Periventricular deep white matter hyperintensities compatible with multiple sclerosis.  There were no prior MRIs for comparison purposes.  Neurology follow-up placed on Solu-Medrol 1 g x 3 days for suspected MS exacerbation.  Subcutaneous Lovenox for DVT prophylaxis.  Tolerating regular diet.  Patient was admitted for a comprehensive rehab program.  Please see preadmission assessment from earlier today as well.  Review of Systems  Constitutional: Negative for chills and fever.  HENT: Negative for hearing loss.   Eyes: Negative for blurred vision and double vision.  Respiratory: Negative for cough and shortness of breath.   Cardiovascular: Negative for chest pain and palpitations.  Gastrointestinal: Positive for constipation. Negative for heartburn, nausea and vomiting.  Genitourinary: Negative for dysuria and hematuria.       Reported stress incontinence with urgency  Musculoskeletal: Positive for myalgias.  Skin: Negative for rash.  Neurological: Positive for dizziness, focal weakness and weakness.       Spasticity    Past Medical History:  Diagnosis Date  . Ankle fracture, left 07/2006  . Ankle fracture, right 10/30/06   [facility]  . Compression fracture 2011   Dr. Dion Saucier   . Disc herniation    L5, S1  . MS (multiple sclerosis) (HCC)    evaluated by Dr. Sandria Manly  . MVP (mitral valve prolapse) 1979   ECHO. Trace MR, trace TR, trace PR 07/17/03  . Nephrolithiasis   . Osteoporosis   . Rib fractures 1998, [id number]   2  . Toe fracture 1998   5   Past Surgical History:  Procedure Laterality Date  . LUMBAR DISC SURGERY  04/2007   L5, S1- [facility]  . SEPTOPLASTY  1983   deviated septum  . TONSILLECTOMY AND ADENOIDECTOMY  1960  . TOTAL ABDOMINAL HYSTERECTOMY W/ BILATERAL SALPINGOOPHORECTOMY  1982   for endometriosis   Family History  Problem Relation Age of Onset  . Stroke Mother   . Neuropathy Brother   . Stroke Father   . Hypertension Neg Hx   . Diabetes Neg Hx   . Depression Neg Hx   . Alcohol abuse Neg Hx   . Drug abuse Neg Hx    Social History:  reports that she has never smoked. She has never used smokeless tobacco. She reports that she does not drink alcohol or use drugs. Allergies:  Allergies  Allergen Reactions  . Procaine Hcl     REACTION: UNSPECIFIED   Medications Prior to Admission  Medication Sig Dispense Refill  . methenamine (HIPREX) 1 g tablet Take 1 g by mouth daily.      Drug Regimen Review Drug regimen was reviewed and remains appropriate with no significant  issues identified  Home: Home Living Family/patient expects to be discharged to:: Private residence Living Arrangements: Alone Available Help at Discharge: Family, Other (Comment)(son brings groceries) Type of Home: House Home Access: [place]: One level Bathroom Shower/Tub: Other (comment)(sponge bathes) Home Equipment: Wheelchair - power, Bedside commode Additional Comments: She has quite a setup that allows her to be independent at wheelchair transfer level   Functional History: Prior  Function Level of Independence: Independent with assistive device(s) Comments: Independent at power chair transfer level; propels to sink to sponge bathe; mostly microwave meals; her BSC is welded to her bed so she doesn't worry about it sliding  Functional Status:  Mobility: Bed Mobility Overal bed mobility: Needs Assistance Bed Mobility: Supine to Sit Supine to sit: Mod assist, +2 for physical assistance General bed mobility comments: used rails, increased time and effort due to dizziness.  Transfers Overall transfer level: Needs assistance Equipment used: None Transfers: Government social research officer transfers: Total assist, +2 physical assistance  Lateral/Scoot Transfers: Mod assist General transfer comment: Pt states she does not put weight on LEs and scoots to chair and pt does not have a drop arm chair therefore performed Anterior posterior transfer into chair today so that pt could sit up.  Ambulation/Gait General Gait Details: Pt does not ambulate at baseline    ADL: ADL Overall ADL's : Needs assistance/impaired Eating/Feeding: Independent, Sitting, Bed level Eating/Feeding Details (indicate cue type and reason): HOB raised Grooming: Wash/dry hands, Wash/dry face, Min guard, Sitting Upper Body Bathing: Moderate assistance, Sitting Upper Body Bathing Details (indicate cue type and reason): Poor sitting balance Lower Body Bathing: Maximal assistance Upper Body Dressing : Moderate assistance Lower Body Dressing: Total assistance Toilet Transfer: Moderate assistance Toilet Transfer Details (indicate cue type and reason): simulated by lateral scoot Toileting- Clothing Manipulation and Hygiene: Maximal assistance, Sitting/lateral lean  Cognition: Cognition Overall Cognitive Status: Within Functional Limits for tasks assessed Orientation Level: Oriented X4 Cognition Arousal/Alertness: Awake/alert Behavior During Therapy: WFL for tasks  assessed/performed Overall Cognitive Status: Within Functional Limits for tasks assessed  Physical Exam: Blood pressure (!) 102/58, pulse 62, temperature 98.1 F (36.7 C), temperature source Oral, resp. rate 16, height 5\' 3"  (1.6 m), weight 51.3 kg, SpO2 95 %. Physical Exam  Vitals reviewed. Constitutional: She appears well-developed and well-nourished.  HENT:  Head: Normocephalic and atraumatic.  Eyes: EOM are normal. Right eye exhibits no discharge. Left eye exhibits no discharge.  Neck: No tracheal deviation present. No thyromegaly present.  Respiratory: Effort normal. No stridor. No respiratory distress.  GI: She exhibits no distension.  Musculoskeletal:     Comments: No edema or tenderness in extremities  Neurological: She is alert.  Makes good eye contact.   Follows full commands.   [place]. Motor: Bilateral upper extremities: 5/5 proximal distal Right lower extremity: Hip flexion, knee extension 2+/5, ankle dorsiflexion 0/5, wiggles toes Left lower extremity: Hip flexion, knee extension 1/5, ankle dorsiflexion 0/5, wiggles toes Sensation intact light touch  Skin: Skin is warm and dry.  Psychiatric: She has a normal mood and affect. Her behavior is normal.    No results found for this or any previous visit (from the past 48 hour(s)). No results found.     Medical Problem List and Plan: 1.  Decreased functional mobility with severe vertigo secondary to MS exacerbation.  Patient completed 3-day course of Solu-Medrol.  Patient will need to be established with neurology on outpatient as she has not received follow-up since 2014  -patient may  may shower  -ELOS/Goals: Mod I/supervision at wheelchair level/7-12 days.  Admit to CIR 2.  Antithrombotics: -DVT/anticoagulation: Lovenox  -antiplatelet therapy: N/A 3. Pain Management: Tylenol as needed 4. Mood: Provide emotional support  -antipsychotic agents: N/A 5. Neuropsych: This patient is capable of making  decisions on her own behalf. 6. Skin/Wound Care: Routine skin checks 7. Fluids/Electrolytes/Nutrition: Routine in and outs.  CMP ordered. 8.  Vertigo.  Trial of Antivert 9.  Neurogenic bowel/ bladder.  Continue Mandelamine for now.  Adjust bowel program.  PVRs ordered  Kimberly Parsons, PA-C 12/02/2019  I have personally performed a face to face diagnostic evaluation, including, but not limited to relevant history and physical exam findings, of this patient and developed relevant assessment and plan.  Additionally, I have reviewed and concur with the physician assistant's documentation above.  Delice Lesch, MD, ABPMR  The patient's status has not changed. The original post admission physician evaluation remains appropriate, and any changes from the pre-admission screening or documentation from the acute chart are noted above.   Delice Lesch, MD, ABPMR

## 2019-12-03 ENCOUNTER — Inpatient Hospital Stay (HOSPITAL_COMMUNITY): Payer: Medicare HMO | Admitting: Speech Pathology

## 2019-12-03 ENCOUNTER — Inpatient Hospital Stay (HOSPITAL_COMMUNITY): Payer: Medicare HMO | Admitting: Physical Therapy

## 2019-12-03 ENCOUNTER — Inpatient Hospital Stay (HOSPITAL_COMMUNITY): Payer: Medicare HMO

## 2019-12-03 DIAGNOSIS — D62 Acute posthemorrhagic anemia: Secondary | ICD-10-CM | POA: Diagnosis present

## 2019-12-03 DIAGNOSIS — R42 Dizziness and giddiness: Secondary | ICD-10-CM

## 2019-12-03 DIAGNOSIS — G35 Multiple sclerosis: Principal | ICD-10-CM

## 2019-12-03 NOTE — Progress Notes (Signed)
Pt slept well during night. Still having some dizziness with movement. Feels her debility is related to the dizziness and not muscular.  It is noted this am that her SBP was 91 while laying on her left side and when rolled to flat moved to 108SBP

## 2019-12-03 NOTE — Evaluation (Signed)
Occupational Therapy Assessment and Plan  Patient Details  Name: [name] MRN: [id number] Date of Birth: [birth year]  OT Diagnosis: abnormal posture and muscle weakness (generalized) Rehab Potential: Rehab Potential (ACUTE ONLY): Fair ELOS: 14-18 days   Today's Date: 12/03/2019 OT Individual Time: 0900-1010 OT Individual Time Calculation (min): 70 min     Problem List:  Patient Active Problem List   Diagnosis Date Noted  . Vertigo as late effect of stroke   . Neurogenic bowel   . Neurogenic bladder   . Multiple sclerosis exacerbation ([place]) 11/27/2019  . Pressure injury of skin 11/27/2019  . Acute pain of left wrist 10/06/2019  . Advance directive discussed with patient 05/17/2018  . Paraplegia, unspecified ([place]) 07/02/2017  . Recurrent UTI 07/02/2017  . Cervical spinal stenosis 06/12/2016  . 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 Health care maintenance 10/18/2012  . Osteoporosis   . Nephrolithiasis   . Dyslipidemia 05/25/2008  . Multiple sclerosis ([place]) 05/24/2008  . MITRAL VALVE PROLAPSE, HX OF 05/24/2008    Past Medical History:  Past Medical History:  Diagnosis Date  . Ankle fracture, left 07/2006  . Ankle fracture, right 10/30/06   [facility]  . Compression fracture 2011   Dr. Mardelle Matte   . Disc herniation    L5, S1  . MS (multiple sclerosis) ([place])    evaluated by Dr. Erling Cruz  . MVP (mitral valve prolapse) 1979   ECHO. Trace MR, trace TR, trace PR 07/17/03  . Nephrolithiasis   . Osteoporosis   . Rib fractures 1998, [id number]   2  . Toe fracture 1998   5   Past Surgical History:  Past Surgical History:  Procedure Laterality Date  . LUMBAR DISC SURGERY  04/2007   L5, S1- [facility]  . SEPTOPLASTY  1983   deviated septum  . TONSILLECTOMY AND ADENOIDECTOMY  1960  . TOTAL ABDOMINAL HYSTERECTOMY W/ BILATERAL SALPINGOOPHORECTOMY  1982   for endometriosis    Assessment & Plan Clinical Impression: [name] is a 73 year old right-handed female with  history of multiple sclerosis diagnosed 1982 and was last seen by neurology services Dr. Corwin Levins in 2014, lumbar disc surgery, mitral valve prolapse.  History taken from chart review and patient.  Patient is maintained only on Mandelamine for recurrent UTIs.  Per chart review patient lives alone.  Her son brings her groceries.  She is essentially wheelchair-bound.  1 level home with ramped entrance.  She propels to sink to sponge bathe and mostly microwaves her meals.  Presented on 11/27/2019 with creasing dizziness.  Admission chemistries with urinalysis negative nitrite, WBC 14,300, SARS coronavirus negative.  Cranial CT scan and MRI were unremarkable for acute intracranial process.  Periventricular deep white matter hyperintensities compatible with multiple sclerosis.  There were no prior MRIs for comparison purposes.  Neurology follow-up placed on Solu-Medrol 1 g x 3 days for suspected MS exacerbation.  Subcutaneous Lovenox for DVT prophylaxis.  Tolerating regular diet.  Patient was admitted for a comprehensive rehab program.  Please see preadmission assessment from earlier today as well. .  Patient transferred to CIR on 12/02/2019 .    Patient currently requires mod with basic self-care skills secondary to muscle weakness, decreased awareness and decreased sitting balance and decreased postural control.  Prior to hospitalization, patient could complete ADLs with modified independent .  Patient will benefit from skilled intervention to decrease level of assist with basic self-care skills and increase level of independence with iADL prior to discharge home independently.  Anticipate patient  will require intermittent supervision and no further OT follow recommended.  OT - End of Session Activity Tolerance: Tolerates 10 - 20 min activity with multiple rests Endurance Deficit: Yes Endurance Deficit Description: frequent rest breaks during functional activity OT Assessment Rehab Potential (ACUTE  ONLY): Fair OT Barriers to Discharge: Incontinence OT Patient demonstrates impairments in the following area(s): Balance;Safety;Perception;Endurance;Motor;Pain OT Basic ADL's Functional Problem(s): Bathing;Dressing;Toileting OT Transfers Functional Problem(s): Toilet OT Additional Impairment(s): None OT Plan OT Intensity: Minimum of 1-2 x/day, 45 to 90 minutes OT Frequency: 5 out of 7 days OT Duration/Estimated Length of Stay: 14-18 days OT Treatment/Interventions: Balance/vestibular training;Discharge planning;Self Care/advanced ADL retraining;Pain management;Therapeutic Activities;UE/LE Coordination activities;Therapeutic Exercise;Skin care/wound managment;Patient/family education;Functional mobility training;DME/adaptive equipment instruction;Psychosocial support;UE/LE Strength taining/ROM OT Basic Self-Care Anticipated Outcome(s): mod I OT Toileting Anticipated Outcome(s): mod I OT Bathroom Transfers Anticipated Outcome(s): mod I OT Recommendation Patient destination: Home Follow Up Recommendations: None Equipment Recommended: None recommended by OT   Skilled Therapeutic Intervention Skilled OT evaluation completed. Pt received supine with no c/o pain. Extensive discussion re PLOF and home set up. Encouraged pt participation in creation of goals to ensure individualized care. Pt with poor sitting balance, frequently losing balance posteriorly and to the L when completing UB bathing/dressing. Pt able to complete ADLs as described below. Extensive discussion and problem solving completed re pt frequent and urgent urination needs. Pt was given female urinal to trial and assisted in using to ensure proper positioning. Pt able to complete scooting EOB with mod A. Attempted transfer to [facility] but pt unable to clear bottom, stating it was because the [facility] was on the wrong side of the bed. Pt was left supine with all needs met.   OT Evaluation Precautions/Restrictions  Precautions Precautions:  Fall Restrictions Weight Bearing Restrictions: No General Chart Reviewed: Yes Family/Caregiver Present: No Vital Signs Therapy Vitals Pulse Rate: 75 BP: 103/64 Patient Position (if appropriate): Lying Pain Pain Assessment Pain Scale: 0-10 Pain Score: 0-No pain Home Living/Prior Functioning Home Living Family/patient expects to be discharged to:: Private residence Living Arrangements: Alone Available Help at Discharge: Available PRN/intermittently(son manages grocery shopping) Type of Home: House Home Access: Ramped entrance Home Layout: One level Bathroom Shower/Tub: Other (comment)(does not shower- sink baths only) Bathroom Toilet: (BSC rigged to the bed) Bathroom Accessibility: No Additional Comments: Pt setup for independence from a w/c level at home  Lives With: Alone IADL History Homemaking Responsibilities: Yes Meal Prep Responsibility: Secondary Laundry Responsibility: Primary Cleaning Responsibility: Primary Bill Paying/Finance Responsibility: Primary Shopping Responsibility: No Child Care Responsibility: No Current License: No Occupation: Retired Prior Function Level of Independence: Independent with transfers, Requires assistive device for independence, Needs assistance with homemaking, Independent with basic ADLs Shopping: Maximal(son performed)  Able to Take Stairs?: No Driving: No Comments: Independent at power chair transfer level; propels to sink to sponge bathe; mostly microwave meals; her BSC is welded to her bed so she doesn't worry about it sliding ADL ADL Eating: Modified independent Where Assessed-Eating: Chair Grooming: Supervision/safety Where Assessed-Grooming: Chair Upper Body Bathing: Minimal assistance Where Assessed-Upper Body Bathing: Edge of bed Lower Body Bathing: Minimal assistance Where Assessed-Lower Body Bathing: Bed level Upper Body Dressing: Minimal assistance Where Assessed-Upper Body Dressing: Edge of bed Lower Body  Dressing: Moderate assistance Where Assessed-Lower Body Dressing: Bed level Toileting: Unable to assess Toilet Transfer: Moderate assistance Toilet Transfer Method: Theatre manager: Radiographer, therapeutic: Not assessed Vision Baseline Vision/History: No visual deficits Patient Visual Report: Blurring of vision Vision Assessment?: No apparent visual deficits(Reports  her vision is worse because she is overdue on an eye exam and needs new glasses) Perception  Perception: Within Functional Limits Praxis Praxis: Intact Cognition Overall Cognitive Status: Within Functional Limits for tasks assessed Arousal/Alertness: Awake/alert Orientation Level: Person;Place;Situation Person: Oriented Place: Oriented Situation: Oriented Year: 2020 Month: December Day of Week: Correct Memory: Appears intact Immediate Memory Recall: Sock;Blue;Bed Memory Recall Sock: Without Cue Memory Recall Blue: Without Cue Memory Recall Bed: With Cue Attention: Sustained Focused Attention: Appears intact Sustained Attention: Appears intact Awareness: Impaired Awareness Impairment: Emergent impairment Problem Solving: Impaired Problem Solving Impairment: Functional complex Safety/Judgment: Appears intact Sensation Sensation Light Touch: Appears Intact Proprioception: Appears Intact Coordination Gross Motor Movements are Fluid and Coordinated: No Fine Motor Movements are Fluid and Coordinated: Yes Coordination and Movement Description: impaired 2/2 generalized weakness LE>UE and L>R Motor  Motor Motor: Other (comment) Motor - Skilled Clinical Observations: impaired 2/2 generalized weakness due to MS exacerbation Mobility  Bed Mobility Bed Mobility: Rolling Right;Rolling Left;Supine to Sit;Sit to Supine Rolling Right: Minimal Assistance - Patient > 75% Rolling Left: Minimal Assistance - Patient > 75% Supine to Sit: Moderate Assistance - Patient 50-74% Sit to  Supine: Moderate Assistance - Patient 50-74%  Trunk/Postural Assessment  Cervical Assessment Cervical Assessment: Exceptions to WFL(forward head) Thoracic Assessment Thoracic Assessment: Exceptions to WFL(rounded shoulders, kyphotic posture) Lumbar Assessment Lumbar Assessment: Exceptions to WFL(posterior pelvic tilt to compensate for core instability) Postural Control Postural Control: Deficits on evaluation Trunk Control: posterior lean when sitting EOB  Balance Balance Balance Assessed: Yes Static Sitting Balance Static Sitting - Balance Support: Bilateral upper extremity supported;Feet supported Static Sitting - Level of Assistance: 4: Min assist Dynamic Sitting Balance Dynamic Sitting - Balance Support: Bilateral upper extremity supported;Feet supported;During functional activity Dynamic Sitting - Level of Assistance: 3: Mod assist Extremity/Trunk Assessment RUE Assessment RUE Assessment: Exceptions to [facility] General Strength Comments: generalized weakness LUE Assessment LUE Assessment: Exceptions to [facility] General Strength Comments: generalized weakness     Refer to Care Plan for Long Term Goals  Recommendations for other services: None    Discharge Criteria: Patient will be discharged from OT if patient refuses treatment 3 consecutive times without medical reason, if treatment goals not met, if there is a change in medical status, if patient makes no progress towards goals or if patient is discharged from hospital.  The above assessment, treatment plan, treatment alternatives and goals were discussed and mutually agreed upon: by patient  Curtis Sites 12/03/2019, 12:47 PM

## 2019-12-03 NOTE — Evaluation (Signed)
Physical Therapy Assessment and Plan  Patient Details  Name: NYKOLE MATOS MRN: [id number] Date of Birth: 09-Oct-1946  PT Diagnosis: Muscle weakness and Vertigo Rehab Potential: Good ELOS: 14-16 days   Today's Date: 12/03/2019 PT Individual Time: 1100-1200 PT Individual Time Calculation (min): 60 min    Problem List:  Patient Active Problem List   Diagnosis Date Noted  . Vertigo as late effect of stroke   . Neurogenic bowel   . Neurogenic bladder   . Multiple sclerosis exacerbation ([place]) 11/27/2019  . Pressure injury of skin 11/27/2019  . Acute pain of left wrist 10/06/2019  . Advance directive discussed with patient 05/17/2018  . Paraplegia, unspecified ([place]) 07/02/2017  . Recurrent UTI 07/02/2017  . Cervical spinal stenosis 06/12/2016  . 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 Health care maintenance 10/18/2012  . Osteoporosis   . Nephrolithiasis   . Dyslipidemia 05/25/2008  . Multiple sclerosis ([place]) 05/24/2008  . MITRAL VALVE PROLAPSE, HX OF 05/24/2008    Past Medical History:  Past Medical History:  Diagnosis Date  . Ankle fracture, left 07/2006  . Ankle fracture, right 10/30/06   [facility]  . Compression fracture 2011   Dr. Mardelle Matte   . Disc herniation    L5, S1  . MS (multiple sclerosis) ([place])    evaluated by Dr. Erling Cruz  . MVP (mitral valve prolapse) 1979   ECHO. Trace MR, trace TR, trace PR 07/17/03  . Nephrolithiasis   . Osteoporosis   . Rib fractures 1998, [id number]   2  . Toe fracture 1998   5   Past Surgical History:  Past Surgical History:  Procedure Laterality Date  . LUMBAR DISC SURGERY  04/2007   L5, S1- [facility]  . SEPTOPLASTY  1983   deviated septum  . TONSILLECTOMY AND ADENOIDECTOMY  1960  . TOTAL ABDOMINAL HYSTERECTOMY W/ BILATERAL SALPINGOOPHORECTOMY  1982   for endometriosis    Assessment & Plan Clinical Impression:  Denzele D. Mclaine is a 73 year old right-handed female with history of multiple sclerosis diagnosed 1982 and was last  seen by neurology services Dr. Corwin Levins in 2014, lumbar disc surgery, mitral valve prolapse.  History taken from chart review and patient.  Patient is maintained only on Mandelamine for recurrent UTIs.  Per chart review patient lives alone.  Her son brings her groceries.  She is essentially wheelchair-bound.  1 level home with ramped entrance.  She propels to sink to sponge bathe and mostly microwaves her meals.  Presented on 11/27/2019 with creasing dizziness.  Admission chemistries with urinalysis negative nitrite, WBC 14,300, SARS coronavirus negative.  Cranial CT scan and MRI were unremarkable for acute intracranial process.  Periventricular deep white matter hyperintensities compatible with multiple sclerosis.  There were no prior MRIs for comparison purposes.  Neurology follow-up placed on Solu-Medrol 1 g x 3 days for suspected MS exacerbation.  Subcutaneous Lovenox for DVT prophylaxis.  Tolerating regular diet.  Patient was admitted for a comprehensive rehab program. Patient transferred to CIR on 12/02/2019 .   Patient currently requires mod to max A with mobility secondary to muscle weakness, abnormal tone and unbalanced muscle activation, peripheral and decreased sitting balance, decreased postural control and decreased balance strategies.  Prior to hospitalization, patient was modified independent  with mobility and lived with Alone in a House home.  Home access is  Ramped entrance.  Patient will benefit from skilled PT intervention to maximize safe functional mobility, minimize fall risk and decrease caregiver burden for planned discharge home with intermittent assist.  Anticipate patient will benefit from follow up [facility] at discharge.  PT - End of Session Activity Tolerance: Tolerates 30+ min activity with multiple rests Endurance Deficit: Yes Endurance Deficit Description: frequent rest breaks during functional activity PT Assessment Rehab Potential (ACUTE/IP ONLY): Good PT Barriers to  Discharge: Decreased caregiver support;Medical stability;Incontinence PT Patient demonstrates impairments in the following area(s): Balance;Endurance;Motor;Safety PT Transfers Functional Problem(s): Bed Mobility;Bed to Chair;Car;Furniture;Floor PT Locomotion Functional Problem(s): Ambulation;Wheelchair Mobility;Stairs PT Plan PT Intensity: Minimum of 1-2 x/day ,45 to 90 minutes PT Frequency: 5 out of 7 days PT Duration Estimated Length of Stay: 14-16 days PT Treatment/Interventions: Balance/vestibular training;Discharge planning;DME/adaptive equipment instruction;Functional mobility training;Neuromuscular re-education;Patient/family education;Psychosocial support;Therapeutic Activities;Therapeutic Exercise;UE/LE Strength taining/ROM;UE/LE Coordination activities;Wheelchair propulsion/positioning PT Transfers Anticipated Outcome(s): mod I PT Locomotion Anticipated Outcome(s): mod I at power w/c level PT Recommendation Recommendations for Other Services: Therapeutic Recreation consult Therapeutic Recreation Interventions: Stress management Follow Up Recommendations: Home health PT Patient destination: Home Equipment Recommended: None recommended by PT Equipment Details: pt already owns all necessary equipment  Skilled Therapeutic Intervention Evaluation completed (see details above and below) with education on PT POC and goals and individual treatment initiated with focus on bed mobility and functional transfer assessment. Pt received seated in bed, agreeable to PT evaluation. No complaints of pain. Pt has had incontinence of urine in bed, setup A for pericare and dependent to don brief. Rolling L/R with min A and skilled cueing. Pt is setup A to don shoes and socks in long-sitting position in bed. Supine to sit with mod A for trunk control. Pt requires min to mod A for sitting balance EOB, leans posteriorly. Pt reports onset of dizziness once in sitting, no further complaints of dizziness during  session. Slide board transfer bed to [facility] with mod A. Pt initially agreeable to attempt to void on [facility] but then reports she does not have urge to void. Pt is able to lift up buttocks on BSC with use of BUE in order to don pants dependently. Lateral scoot transfer back to bed with max A, LOB posteriorly onto bed. Pt able to bring BLE back into bed with mod A. Pt left semi-reclined in bed with needs in reach at end of session. Set pt up with power chair to attempt transfer to and trial w/c mobility with next session.  PT Evaluation Precautions/Restrictions Precautions Precautions: Fall Restrictions Weight Bearing Restrictions: No Pain Pain Assessment Pain Scale: 0-10 Pain Score: 0-No pain Home Living/Prior Functioning Home Living Available Help at Discharge: Available PRN/intermittently(son manages grocery shopping) Type of Home: House Home Access: Ramped entrance Home Layout: One level Bathroom Shower/Tub: Other (comment)(does not shower- sink baths only) Bathroom Toilet: (BSC rigged to the bed) Bathroom Accessibility: No Additional Comments: Pt setup for independence from a w/c level at home  Lives With: Alone Prior Function Level of Independence: Independent with transfers;Requires assistive device for independence;Needs assistance with homemaking;Independent with basic ADLs Shopping: Maximal(son performed)  Able to Take Stairs?: No Driving: No Comments: Independent at power chair transfer level; propels to sink to sponge bathe; mostly microwave meals; her BSC is welded to her bed so she doesn't worry about it sliding Vision/Perception  Perception Perception: Within Functional Limits Praxis Praxis: Intact  Cognition Overall Cognitive Status: Within Functional Limits for tasks assessed Arousal/Alertness: Awake/alert Orientation Level: Oriented X4 Attention: Sustained Focused Attention: Appears intact Sustained Attention: Appears intact Memory: Appears intact Immediate Memory  Recall: Sock;Blue;Bed Memory Recall Sock: Without Cue Memory Recall Blue: Without Cue Memory Recall Bed: With Cue Awareness: Impaired  Awareness Impairment: Emergent impairment Problem Solving: Impaired Problem Solving Impairment: Functional complex Safety/Judgment: Appears intact Sensation Sensation Light Touch: Appears Intact Proprioception: Appears Intact Coordination Gross Motor Movements are Fluid and Coordinated: No Fine Motor Movements are Fluid and Coordinated: Yes Coordination and Movement Description: impaired 2/2 generalized weakness LE>UE and L>R Motor  Motor Motor: Other (comment) Motor - Skilled Clinical Observations: impaired 2/2 generalized weakness due to MS exacerbation  Mobility Bed Mobility Bed Mobility: Rolling Right;Rolling Left;Supine to Sit;Sit to Supine Rolling Right: Minimal Assistance - Patient > 75% Rolling Left: Minimal Assistance - Patient > 75% Supine to Sit: Moderate Assistance - Patient 50-74% Sit to Supine: Moderate Assistance - Patient 50-74% Transfers Transfers: Lateral/Scoot Transfers Lateral/Scoot Transfers: Maximal Assistance - Patient 25-49% Transfer (Assistive device): (slideboard) Artist / Additional Locomotion Stairs: No Wheelchair Mobility Wheelchair Mobility: No  Trunk/Postural Assessment  Cervical Assessment Cervical Assessment: Exceptions to WFL(forward head) Thoracic Assessment Thoracic Assessment: Exceptions to WFL(rounded shoulders, kyphotic posture) Lumbar Assessment Lumbar Assessment: Exceptions to WFL(posterior pelvic tilt to compensate for core instability) Postural Control Postural Control: Deficits on evaluation Trunk Control: posterior lean when sitting EOB  Balance Balance Balance Assessed: Yes Static Sitting Balance Static Sitting - Balance Support: Bilateral upper extremity supported;Feet supported Static Sitting - Level of Assistance: 4: Min assist Dynamic Sitting Balance Dynamic Sitting -  Balance Support: Bilateral upper extremity supported;Feet supported;During functional activity Dynamic Sitting - Level of Assistance: 3: Mod assist Extremity Assessment  RUE Assessment RUE Assessment: Exceptions to [facility] General Strength Comments: generalized weakness LUE Assessment LUE Assessment: Exceptions to [facility] General Strength Comments: generalized weakness RLE Assessment RLE Assessment: Exceptions to [facility] General Strength Comments: impaired RLE Strength RLE Overall Strength: Deficits RLE Overall Strength Comments: grossly 2+/5 hip and knee, ankle 0/5 LLE Assessment LLE Assessment: Exceptions to [facility] General Strength Comments: impaired LLE Strength LLE Overall Strength: Deficits LLE Overall Strength Comments: grossly 1/5 hip and knee, ankle 0/5    Refer to Care Plan for Long Term Goals  Recommendations for other services: Therapeutic Recreation  Stress management  Discharge Criteria: Patient will be discharged from PT if patient refuses treatment 3 consecutive times without medical reason, if treatment goals not met, if there is a change in medical status, if patient makes no progress towards goals or if patient is discharged from hospital.  The above assessment, treatment plan, treatment alternatives and goals were discussed and mutually agreed upon: by patient   Excell Seltzer, PT, DPT 12/03/2019, 12:50 PM

## 2019-12-03 NOTE — Progress Notes (Signed)
[place] PHYSICAL MEDICINE & REHABILITATION PROGRESS NOTE  Subjective/Complaints: Patient seen laying in bed this morning.  She states she slept well overnight.  She states she is ready begin therapies today.  ROS: + Dizziness.  Denies CP, shortness of breath, nausea, vomiting, diarrhea.  Objective: Vital Signs: Blood pressure (!) 109/55, pulse 80, temperature 98.1 F (36.7 C), temperature source Oral, resp. rate 17, SpO2 98 %. No results found. No results for input(s): WBC, HGB, HCT, PLT in the last 72 hours. No results for input(s): NA, K, CL, CO2, GLUCOSE, BUN, CREATININE, CALCIUM in the last 72 hours.  Physical Exam: BP (!) 109/55 (BP Location: Left Arm)   Pulse 80   Temp 98.1 F (36.7 C) (Oral)   Resp 17   SpO2 98%  Constitutional: No distress . Vital signs reviewed. HENT: Normocephalic.  Atraumatic. Eyes: EOMI. No discharge. Cardiovascular: No JVD. Respiratory: Normal effort.  No stridor. GI: Non-distended. Skin: Warm and dry.  Intact. Psych: Normal mood.  Normal behavior. Musc: No edema in extremities.  No tenderness in extremities. Neurological: Alert Makes good eye contact.   Follows full commands.   [place]. Motor: Bilateral upper extremities: 5 /5 proximal distal Right lower extremity: Hip flexion, knee extension 2+/5, ankle dorsiflexion 0/5, wiggles toes, unchanged Left lower extremity: Hip flexion, knee extension 1/5, ankle dorsiflexion 0/5, wiggles toes, unchanged  Assessment/Plan: 1. Functional deficits secondary to multiple sclerosis exacerbation which require 3+ hours per day of interdisciplinary therapy in a comprehensive inpatient rehab setting.  Physiatrist is providing close team supervision and 24 hour management of active medical problems listed below.  Physiatrist and rehab team continue to assess barriers to discharge/monitor patient progress toward functional and medical goals  Care Tool:  Bathing    Body parts bathed by  patient: Right arm, Left arm, Chest, Abdomen, Front perineal area, Buttocks, Right upper leg, Left upper leg, Face, Right lower leg, Left lower leg         Bathing assist Assist Level: Moderate Assistance - Patient 50 - 74%(bed level and EOB- same as she did PTA)     Upper Body Dressing/Undressing Upper body dressing   What is the patient wearing?: Pull over shirt    Upper body assist Assist Level: Minimal Assistance - Patient > 75%    Lower Body Dressing/Undressing Lower body dressing      What is the patient wearing?: Underwear/pull up     Lower body assist Assist for lower body dressing: Moderate Assistance - Patient 50 - 74%     Toileting Toileting    Toileting assist Assist for toileting: Moderate Assistance - Patient 50 - 74%     Transfers Chair/bed transfer  Transfers assist     Chair/bed transfer assist level: Maximal Assistance - Patient 25 - 49%     Locomotion Ambulation   Ambulation assist   Ambulation activity did not occur: N/A(did not perform prior to admission)          Walk 10 feet activity   Assist  Walk 10 feet activity did not occur: N/A        Walk 50 feet activity   Assist Walk 50 feet with 2 turns activity did not occur: N/A         Walk 150 feet activity   Assist Walk 150 feet activity did not occur: N/A         Walk 10 feet on uneven surface  activity   Assist Walk 10 feet on uneven surfaces activity  did not occur: N/A         Wheelchair     Assist Will patient use wheelchair at discharge?: Yes Type of Wheelchair: Power Wheelchair activity did not occur: Safety/medical concerns         Wheelchair 50 feet with 2 turns activity    Assist    Wheelchair 50 feet with 2 turns activity did not occur: Safety/medical concerns       Wheelchair 150 feet activity     Assist Wheelchair 150 feet activity did not occur: Safety/medical concerns          Medical Problem List and  Plan: 1.  Decreased functional mobility with severe vertigo secondary to MS exacerbation.  Patient completed 3-day course of Solu-Medrol.  Patient will need to be established with neurology on outpatient as she has not received follow-up since 2014             Begin CIR evaluations 2.  Antithrombotics: -DVT/anticoagulation: Lovenox             -antiplatelet therapy: N/A 3. Pain Management: Tylenol as needed 4. Mood: Provide emotional support             -antipsychotic agents: N/A 5. Neuropsych: This patient is capable of making decisions on her own behalf. 6. Skin/Wound Care: Routine skin checks 7. Fluids/Electrolytes/Nutrition: Routine in and outs.  CMP ordered for Monday. 8.  Vertigo.  Trial of Antivert  Monitor with increased mobility 9.  Neurogenic bowel/ bladder.  Continue Mandelamine for now.  Adjust bowel program.    PVRs ordered 10.  Hypophosphatemia  Phosphorus 2.2 on 12/14, labs ordered for Monday 12.  Acute blood loss anemia  Hemoglobin 11.4 on 12/14  Labs ordered for Monday  LOS: 1 days A FACE TO FACE EVALUATION WAS PERFORMED  Clete Kuch Karis Juba 12/03/2019, 2:40 PM

## 2019-12-04 ENCOUNTER — Inpatient Hospital Stay (HOSPITAL_COMMUNITY): Payer: Medicare HMO

## 2019-12-04 ENCOUNTER — Inpatient Hospital Stay (HOSPITAL_COMMUNITY): Payer: Medicare HMO | Admitting: Speech Pathology

## 2019-12-04 DIAGNOSIS — I959 Hypotension, unspecified: Secondary | ICD-10-CM | POA: Diagnosis present

## 2019-12-04 DIAGNOSIS — R0989 Other specified symptoms and signs involving the circulatory and respiratory systems: Secondary | ICD-10-CM | POA: Diagnosis present

## 2019-12-04 DIAGNOSIS — I9589 Other hypotension: Secondary | ICD-10-CM

## 2019-12-04 NOTE — Progress Notes (Signed)
Pt slept well throughout night, rounded on frequently for toileting due to her not calling, needs enc to call doesn't want to "bother anyone". continue to have dizziness with movement started on prn Antivert this am

## 2019-12-04 NOTE — Progress Notes (Signed)
Physical Therapy Session Note  Patient Details  Name: [name] MRN: [id number] Date of Birth: 09-08-1946  Today's Date: 12/04/2019 PT Individual Time: 1315-1430 PT Individual Time Calculation (min): 75 min    Short Term Goals: Week 1:  PT Short Term Goal 1 (Week 1): Pt will perform bed mobility with min A  PT Short Term Goal 2 (Week 1): Pt will perform least restrictive transfer with min A PT Short Term Goal 3 (Week 1): Pt will initiate power w/c mobility  Skilled Therapeutic Interventions/Progress Updates:    Pt supine in bed upon PT arrival, agreeable to therapy tx and denies pain at rest. Pt reports that her biggest complaint is still her dizziness, she reports "otherwise I could just go home." Pt transferred to sitting EOB with mod assist, slideboard transfer to power w/c with mod assist. Pt performed power w/c mobility throughout the session with supervision and cues for use of controls 2 x 150 ft. Pt participated in vestibular testing today since this is her biggest complaint. Performed saccades [facility]), smooth pursuits (WFL) and VOR testing WFL (negative for head thrust test and head shaking nystagmus test). Given these, pt negative for central vestibular testing. Pt manuevered power chair to park next to the mat, performed slideboard transfer from w/c<>mat this session with min assist and cues for techniques. Pt agreeable to participate in positional vestibular testing, max assist to transfer to long sitting and to get into dix-hall pike testing position. Therapist tested pt's L side, pt positive on Left for posterior canal with upbeating torsional nystagmus lasting <60 sec. Pt returned to sitting position edge of mat with max assist. Pt agreeable to go through the Surveyor, minerals ([place]). Pt requiring mod-max assist to go through the positions, maintained each position until nystagmus stopped + 30 seconds. Education provided throughout on findings and treatments,  discussed need for reassessment tomorrow and possibly continued treatment with this maneuver. Pt transferred back to w/c with min assist and slideboard, performed power mobility back to room. Slideboard transfer to bed with min assist. Pt reports incontinence of bladder. Sit>supine mod assist, rolling with CGA in each direction while therapist changed brief, pt performed pericare. Pt left supine with needs in reach and bed alarm set at end of session.   Therapy Documentation Precautions:  Precautions Precautions: Fall Restrictions Weight Bearing Restrictions: No    Therapy/Group: Individual Therapy  Netta Corrigan, PT, DPT, CSRS 12/04/2019, 1:00 PM

## 2019-12-04 NOTE — Progress Notes (Signed)
[place] PHYSICAL MEDICINE & REHABILITATION PROGRESS NOTE  Subjective/Complaints: Patient seen sitting up working with therapy this morning.  She states she slept well overnight.  ROS: + Dizziness, unchanged.  Denies CP, shortness of breath, nausea, vomiting, diarrhea.  Objective: Vital Signs: Blood pressure (!) 91/59, pulse 77, temperature 98 F (36.7 C), resp. rate 19, SpO2 95 %. No results found. No results for input(s): WBC, HGB, HCT, PLT in the last 72 hours. No results for input(s): NA, K, CL, CO2, GLUCOSE, BUN, CREATININE, CALCIUM in the last 72 hours.  Physical Exam: BP (!) 91/59 (BP Location: Left Arm)   Pulse 77   Temp 98 F (36.7 C)   Resp 19   SpO2 95%  Constitutional: No distress . Vital signs reviewed. HENT: Normocephalic.  Atraumatic. Eyes: EOMI. No discharge. Cardiovascular: No JVD. Respiratory: Normal effort.  No stridor. GI: Non-distended. Skin: Warm and dry.  Intact. Psych: Normal mood.  Normal behavior. Musc: No edema in extremities.  No tenderness in extremities. Neurological: Alert Makes good eye contact.   Follows full commands.   [place]. Motor: Bilateral upper extremities: 5 /5 proximal distal Right lower extremity: Hip flexion, knee extension 2+/5, ankle dorsiflexion 0/5, wiggles toes (baseline) Left lower extremity: Hip flexion, knee extension 1/5, ankle dorsiflexion 0/5, wiggles toes (baseline)  Assessment/Plan: 1. Functional deficits secondary to multiple sclerosis exacerbation which require 3+ hours per day of interdisciplinary therapy in a comprehensive inpatient rehab setting.  Physiatrist is providing close team supervision and 24 hour management of active medical problems listed below.  Physiatrist and rehab team continue to assess barriers to discharge/monitor patient progress toward functional and medical goals  Care Tool:  Bathing    Body parts bathed by patient: Front perineal area, Buttocks         Bathing  assist Assist Level: Minimal Assistance - Patient > 75%     Upper Body Dressing/Undressing Upper body dressing   What is the patient wearing?: Pull over shirt    Upper body assist Assist Level: Minimal Assistance - Patient > 75%    Lower Body Dressing/Undressing Lower body dressing      What is the patient wearing?: Pants     Lower body assist Assist for lower body dressing: Minimal Assistance - Patient > 75%     Toileting Toileting    Toileting assist Assist for toileting: Moderate Assistance - Patient 50 - 74%     Transfers Chair/bed transfer  Transfers assist     Chair/bed transfer assist level: Minimal Assistance - Patient > 75%     Locomotion Ambulation   Ambulation assist   Ambulation activity did not occur: N/A(did not perform prior to admission)          Walk 10 feet activity   Assist  Walk 10 feet activity did not occur: N/A        Walk 50 feet activity   Assist Walk 50 feet with 2 turns activity did not occur: N/A         Walk 150 feet activity   Assist Walk 150 feet activity did not occur: N/A         Walk 10 feet on uneven surface  activity   Assist Walk 10 feet on uneven surfaces activity did not occur: N/A         Wheelchair     Assist Will patient use wheelchair at discharge?: Yes Type of Wheelchair: Power Wheelchair activity did not occur: Safety/medical concerns  Wheelchair assist level: Supervision/Verbal  cueing Max wheelchair distance: 150 ft    Wheelchair 50 feet with 2 turns activity    Assist    Wheelchair 50 feet with 2 turns activity did not occur: Safety/medical concerns   Assist Level: Supervision/Verbal cueing   Wheelchair 150 feet activity     Assist Wheelchair 150 feet activity did not occur: Safety/medical concerns   Assist Level: Supervision/Verbal cueing      Medical Problem List and Plan: 1.  Decreased functional mobility with severe vertigo secondary to MS  exacerbation.  Patient completed 3-day course of Solu-Medrol.  Patient will need to be established with neurology on outpatient as she has not received follow-up since 2014             Continue CIR 2.  Antithrombotics: -DVT/anticoagulation: Lovenox             -antiplatelet therapy: N/A 3. Pain Management: Tylenol as needed 4. Mood: Provide emotional support             -antipsychotic agents: N/A 5. Neuropsych: This patient is capable of making decisions on her own behalf. 6. Skin/Wound Care: Routine skin checks 7. Fluids/Electrolytes/Nutrition: Routine in and outs.  CMP ordered for tomorrow. 8.  Vertigo.  Antivert PRN, monitor for efficacy  Discussed Scopolamine patch, however, will hold today given BP 9.  Neurogenic bowel/ bladder.  Continue Mandelamine for now.  Adjust bowel program.    PVRs ordered, pending 10.  Hypophosphatemia  Phosphorus 2.2 on 12/14, labs ordered for tomorrow 12.  Acute blood loss anemia  Hemoglobin 11.4 on 12/14, labs ordered for tomorrow 13.  Labile/soft blood pressure  Monitor with activity, asymptomatic at present  LOS: 2 days A FACE TO FACE EVALUATION WAS PERFORMED  Jannah Guardiola Karis Juba 12/04/2019, 3:49 PM

## 2019-12-04 NOTE — Progress Notes (Signed)
Patient has a blood pressure of  78/56  Pulse 61. [place] is yellow. Patient shows no s/s of distress. Patient encouraged to drink more fluids. MD on-call notified no new orders given but to monitor patient closely. Patient sitting up in chair, no pain, will continue to assess and monitor throughout remainder of shift.

## 2019-12-04 NOTE — Progress Notes (Signed)
Occupational Therapy Session Note  Patient Details  Name: [name] MRN: [id number] Date of Birth: 03-02-1946  Today's Date: 12/04/2019 OT Individual Time: 5834-6219 OT Individual Time Calculation (min): 56 min    Short Term Goals: Week 1:  OT Short Term Goal 1 (Week 1): Pt will don LB clothing with min A (bed level or EOB) OT Short Term Goal 2 (Week 1): Pt will maintain static sitting balance with no more than 1 LOB during grooming task OT Short Term Goal 3 (Week 1): Pt will complete lateral scoot transfer with LRAD to [facility] with min A  Skilled Therapeutic Interventions/Progress Updates:    Pt received eating breakfast, requesting to finish before starting therapy. Discussed vertigo and dizziness with pt. Pt reporting urinary incontinence. Pt able to roll R and L for new brief placement with CGA. Pt able to complete all peri hygiene bed level with set up assist. With use of bed features pt able to don pants bed level with min A. Pt completed bed mobility to EOB with min A. Air mattress makes it more difficult for pt to move vs at home, states pt. Power chair set up for lateral scoot transfer. Pt able to complete with min A and increased time for problem solving 2/2 chair being different than hers at home. Reviewed chair features and use. Pt completed oral care at the sink with set up assist. Pt was assisted in setting up power chair to aid in transfer for nursing. Discussed transfer with nursing as well to ensure ease and safety. Pt left sitting up with all needs met.   Therapy Documentation Precautions:  Precautions Precautions: Fall Restrictions Weight Bearing Restrictions: No Therapy/Group: Individual Therapy  Curtis Sites 12/04/2019, 6:28 AM

## 2019-12-05 ENCOUNTER — Inpatient Hospital Stay (HOSPITAL_COMMUNITY): Payer: Medicare HMO | Admitting: Occupational Therapy

## 2019-12-05 ENCOUNTER — Inpatient Hospital Stay (HOSPITAL_COMMUNITY): Payer: Medicare HMO | Admitting: Physical Therapy

## 2019-12-05 ENCOUNTER — Telehealth: Payer: Self-pay

## 2019-12-05 LAB — COMPREHENSIVE METABOLIC PANEL
ALT: 107 U/L — ABNORMAL HIGH (ref 0–44)
AST: 60 U/L — ABNORMAL HIGH (ref 15–41)
Albumin: 2.6 g/dL — ABNORMAL LOW (ref 3.5–5.0)
Alkaline Phosphatase: 54 U/L (ref 38–126)
Anion gap: 8 (ref 5–15)
BUN: 15 mg/dL (ref 8–23)
CO2: 29 mmol/L (ref 22–32)
Calcium: 8.8 mg/dL — ABNORMAL LOW (ref 8.9–10.3)
Chloride: 105 mmol/L (ref 98–111)
Creatinine, Ser: 0.59 mg/dL (ref 0.44–1.00)
GFR calc Af Amer: >60 mL/min (ref >60)
GFR calc non Af Amer: >60 mL/min (ref >60)
Glucose, Bld: 89 mg/dL (ref 70–99)
Potassium: 5.1 mmol/L (ref 3.5–5.1)
Sodium: 142 mmol/L (ref 135–145)
Total Bilirubin: 0.5 mg/dL (ref 0.3–1.2)
Total Protein: 5.2 g/dL — ABNORMAL LOW (ref 6.5–8.1)

## 2019-12-05 LAB — CBC WITH DIFFERENTIAL/PLATELET
Abs Immature Granulocytes: 0.15 10*3/uL — ABNORMAL HIGH (ref 0.00–0.07)
Basophils Absolute: 0 10*3/uL (ref 0.0–0.1)
Basophils Relative: 0 %
Eosinophils Absolute: 0.3 10*3/uL (ref 0.0–0.5)
Eosinophils Relative: 3 %
HCT: 39.7 % (ref 36.0–46.0)
Hemoglobin: 13 g/dL (ref 12.0–15.0)
Immature Granulocytes: 1 %
Lymphocytes Relative: 29 %
Lymphs Abs: 3.2 10*3/uL (ref 0.7–4.0)
MCH: 31.5 pg (ref 26.0–34.0)
MCHC: 32.7 g/dL (ref 30.0–36.0)
MCV: 96.1 fL (ref 80.0–100.0)
Monocytes Absolute: 1.2 10*3/uL — ABNORMAL HIGH (ref 0.1–1.0)
Monocytes Relative: 11 %
Neutro Abs: 6.2 10*3/uL (ref 1.7–7.7)
Neutrophils Relative %: 56 %
Platelets: 376 10*3/uL (ref 150–400)
RBC: 4.13 MIL/uL (ref 3.87–5.11)
RDW: 12.4 % (ref 11.5–15.5)
WBC: 11 10*3/uL — ABNORMAL HIGH (ref 4.0–10.5)
nRBC: 0 % (ref 0.0–0.2)

## 2019-12-05 LAB — PHOSPHORUS: Phosphorus: 3.8 mg/dL (ref 2.5–4.6)

## 2019-12-05 NOTE — Telephone Encounter (Signed)
I spoke with David;the B 12 shots in the past have done better to build pts energy level than taking the B 12 orally. Shanon Brow said he knows a couple of RN's that could give the pt her shots at home. David request cb after Dr Silvio Pate reviews this note. [employer] [place].

## 2019-12-05 NOTE — IPOC Note (Signed)
Overall Plan of Care [facility]) Patient Details Name: [name] MRN: [id number] DOB: [birth year]  Admitting Diagnosis: Multiple sclerosis [facility])  Hospital Problems: Principal Problem:   Multiple sclerosis ([place]) Active Problems:   Paraplegia, unspecified ([place])   Multiple sclerosis exacerbation ([facility])   Vertigo as late effect of stroke   Vertigo   Hypophosphatemia   Acute blood loss anemia   Labile blood pressure   Arterial hypotension     Functional Problem List: Nursing Bladder, Endurance, Motor  PT Balance, Endurance, Motor, Safety  OT Balance, Safety, Perception, Endurance, Motor, Pain  SLP    TR         Basic ADL's: OT Bathing, Dressing, Toileting     Advanced  ADL's: OT       Transfers: PT Bed Mobility, Bed to Chair, Car, [employer], Floor  OT Toilet     Locomotion: PT Ambulation, Emergency planning/management officer, Stairs     Additional Impairments: OT None  SLP        TR      Anticipated Outcomes Item Anticipated Outcome  Self Feeding    Swallowing      Basic self-care  mod I  Toileting  mod I   Bathroom Transfers mod I  Bowel/Bladder  Pt will manage bowel and bladder with mod I assist  Transfers  mod I  Locomotion  mod I at power w/c level  Communication     Cognition     Pain  Pt will manage pain at 3 or less on a scale of 0-10.  Safety/Judgment  Pt will remain free of falls with injury while in rehab with min assist   Therapy Plan: PT Intensity: Minimum of 1-2 x/day ,45 to 90 minutes PT Frequency: 5 out of 7 days PT Duration Estimated Length of Stay: 14-16 days OT Intensity: Minimum of 1-2 x/day, 45 to 90 minutes OT Frequency: 5 out of 7 days OT Duration/Estimated Length of Stay: 14-18 days     Due to the current state of emergency, patients may not be receiving their 3-hours of Medicare-mandated therapy.   Team Interventions: Nursing Interventions Patient/Family Education, Bladder Management, Disease Management/Prevention, Discharge  Planning  PT interventions Balance/vestibular training, Discharge planning, DME/adaptive equipment instruction, Functional mobility training, Neuromuscular re-education, Patient/family education, Psychosocial support, Therapeutic Activities, Therapeutic Exercise, UE/LE Strength taining/ROM, UE/LE Coordination activities, Wheelchair propulsion/positioning  OT Interventions Balance/vestibular training, Discharge planning, Self Care/advanced ADL retraining, Pain management, Therapeutic Activities, UE/LE Coordination activities, Therapeutic Exercise, Skin care/wound managment, Patient/family education, Functional mobility training, DME/adaptive equipment instruction, Psychosocial support, UE/LE Strength taining/ROM  SLP Interventions    TR Interventions    SW/CM Interventions Discharge Planning, Psychosocial Support, Patient/Family Education   Barriers to Discharge MD  Medical stability, Neurogenic bowel and bladder, Weight, Behavior and severe vertigo  Nursing Lack of/limited family support, Medical stability    PT Decreased caregiver support, Medical stability, Incontinence    OT Incontinence    SLP      SW       Team Discharge Planning: Destination: PT-Home ,OT- Home , SLP-  Projected Follow-up: PT-Home health PT, OT-  None, SLP-  Projected Equipment Needs: PT-None recommended by PT, OT- None recommended by OT, SLP-  Equipment Details: PT-pt already owns all necessary equipment, OT-  Patient/family involved in discharge planning: PT- Patient,  OT-Patient, SLP-   MD ELOS: 14-18 days Medical Rehab Prognosis:  Good Assessment: Pt is a 73 yr old female with paraplegia since 68s with a questionable dx of MS and admission for severe  vertigo- it's impacting her ability to do her own ADLs/transfers- will get her vestibular rehab evalaution, and discuss treating low BP since that could also be impacting her vertigo- also has neurogenic bowel and bladder to some extent- and hyperkalemia and low  grade leukocytisis.  Goals Mod I in 14-18 days    See Team Conference Notes for weekly updates to the plan of care

## 2019-12-05 NOTE — Progress Notes (Signed)
Physical Therapy Session Note  Patient Details  Name: [name] MRN: [id number] Date of Birth: 1946-10-04  Today's Date: 12/05/2019 PT Individual Time: 1100-1200 PT Individual Time Calculation (min): 60 min   Short Term Goals: Week 1:  PT Short Term Goal 1 (Week 1): Pt will perform bed mobility with min A  PT Short Term Goal 2 (Week 1): Pt will perform least restrictive transfer with min A PT Short Term Goal 3 (Week 1): Pt will initiate power w/c mobility  Skilled Therapeutic Interventions/Progress Updates:    Pt received seated in power w/c in room, agreeable to PT session. No complaints of pain. Power w/c mobility 2 x 150 ft at Supervision level with cueing for changing speeds according to environment. Pt requires min cueing to set w/c up next to mat table for transfer. Slide board transfer w/c to mat table with min A. Pt exhibits posterior trunk lean during transfer and needs verbal and tactile cues for anterior weight shift. Seated balance EOM progressing from BUE and LE support with SBA to no UE support with close SBA to ball toss with close SBA and occasional min A needed to recover balance. Sit-ups x 10 reps from red therapy wedge to EOM with min A progressing to close SBA with intermittent use of BUE support. Lateral scoot transfer mat table to/from w/c with min A with 6" step under BLE for support. Pt does well clearing buttocks off of mat table when performing scoot transfer but continues to exhibit posterior lean with her trunk. Lateral scoot transfer back to bed at end of session with mod A due to instability of air mattress. Sit to supine mod A for BLE management. No complaints of dizziness during session. Pt left semi-reclined in bed with needs in reach at end of session.  Therapy Documentation Precautions:   Precautions Precautions: Fall Restrictions Weight Bearing Restrictions: No    Therapy/Group: Individual Therapy   Excell Seltzer, PT, DPT  12/05/2019, 12:55  PM

## 2019-12-05 NOTE — Progress Notes (Signed)
Patient information reviewed and entered into [facility] System by Becky Jourdyn Hasler, PPS coordinator. Information including medical coding, function ability, and quality indicators will be reviewed and updated through discharge.   

## 2019-12-05 NOTE — Telephone Encounter (Signed)
Spoke to [employer]. He will call us when she is ready to come home and we will send the rx then.

## 2019-12-05 NOTE — Telephone Encounter (Signed)
She is currently at inpatient rehab at [facility]. I am okay with sending Rx for the B12 for injections and having nurse friend administer them at home--once she gets back Okay to send Rx if that is what they want

## 2019-12-05 NOTE — Plan of Care (Signed)
  Problem: Consults Goal: RH GENERAL PATIENT EDUCATION Description: See Patient Education module for education specifics. Outcome: Progressing   Problem: RH SKIN INTEGRITY Goal: RH STG MAINTAIN SKIN INTEGRITY WITH ASSISTANCE Description: STG Maintain Skin Integrity With Mod I Assistance. Outcome: Progressing

## 2019-12-05 NOTE — Progress Notes (Signed)
Occupational Therapy Session Note  Patient Details  Name: [name] MRN: [id number] Date of Birth: 1946-08-22  Today's Date: 12/05/2019 OT Individual Time: 3419-3790 OT Individual Time Calculation (min): 75 min    Short Term Goals: Week 1:  OT Short Term Goal 1 (Week 1): Pt will don LB clothing with min A (bed level or EOB) OT Short Term Goal 2 (Week 1): Pt will maintain static sitting balance with no more than 1 LOB during grooming task OT Short Term Goal 3 (Week 1): Pt will complete lateral scoot transfer with LRAD to [facility] with min A  Skilled Therapeutic Interventions/Progress Updates:    Pt seen for follow-up vestibular treatment.  Noted therapist performed Marye Round test yesterday with positive left posterior canal BPPV and completed Epley Maneuver for treatment.  Pt in supine to start session and agreed to testing.  Therapist completed horizontal canal testing with both sides being negative.  Therapist then tested anterior and posterior canals on both sides multiple times using the bed in Trendelenberg and all times were negative.  Had pt roll in supine to both sides with min guard assist with pt reporting no dizziness and no nystagmus noted.  She transitioned to sitting with report of slight dizziness lasting less than 2 seconds.  She did not report dizziness with sitting and transition to the right side or for transition to the left side, only with supine to sit.  The intensity was much less than in previous days.  Once supported in bed, she was re-tested on visual tracking, VOR, saccadic movements, and head thrust. All were WFLS.   It was noted that at rest pt demonstrates some very sporadic and slow occurring horizontal nystagmus with fast beat to the right.  One to 2 beats would occur every 5-10 seconds, with pt reporting no feeling of dizziness at all.  This nystagmus was present with all holding of gaze in all directions.  Pt has also stated some slight ringing in her ears as  well, but no loss of hearing.  Feel that the majority of her dizziness was likely secondary to BPPV, which was treated in yesterday's PT session and seems to be successful.  She does exhibit some blurry vision, and some resting nystagmus which might be attributable to MS changes but I cannot confirm this.  She demonstrated no impairment with testing for  central vestibular disorder at this time.  Would recommend neurology input to determine if the slow resting nystagmus can be the result of her MS, as she did not demonstrate any vestibular hypofunction and the symptoms do not seem to be consistent or severe enough to consider Miniere's.  Will continue to monitor for changes in her dizziness but feel like it was mostly attributable to BPPV which was treated successfully.     Therapy Documentation Precautions:  Precautions Precautions: Fall Restrictions Weight Bearing Restrictions: No   Pain: Pain Assessment Pain Scale: Faces Pain Score: 0-No pain ADL: See Care Tool Section for some details of mobility and selfcare  Therapy/Group: Individual Therapy  Marshall Kampf OTR/L 12/05/2019, 4:06 PM

## 2019-12-05 NOTE — Progress Notes (Signed)
[facility] PHYSICAL MEDICINE & REHABILITATION PROGRESS NOTE  Subjective/Complaints:   Pt reports dizziness with nausea was bad yesterday during therapy- hasn't had this AM yet, but "hasn't really moved yet" either.    ROS: + Dizziness, unchanged.  Denies CP, shortness of breath, nausea, vomiting, diarrhea.  Objective: Vital Signs: Blood pressure 96/60, pulse 67, temperature 98.2 F (36.8 C), temperature source Oral, resp. rate 17, SpO2 96 %. No results found. Recent Labs    12/05/19 0544  WBC 11.0*  HGB 13.0  HCT 39.7  PLT 376   Recent Labs    12/05/19 0544  NA 142  K 5.1  CL 105  CO2 29  GLUCOSE 89  BUN 15  CREATININE 0.59  CALCIUM 8.8*    Physical Exam: BP 96/60   Pulse 67   Temp 98.2 F (36.8 C) (Oral)   Resp 17   SpO2 96%  Constitutional: No distress . Vital signs reviewed. Laying in bed; appears comfortable, supine, NAD HENT: Normocephalic.  Atraumatic. Eyes: EOMI. No discharge. Cardiovascular: RRR; No JVD. Respiratory: Normal effort.  No stridor. CTA B/L GI: Non-distended.soft, NT, ND Skin: Warm and dry.  Intact. Psych: Normal mood.  Normal behavior. Musc: No edema in extremities.  No tenderness in extremities. Neurological: Alert Motor: Bilateral upper extremities: 5 /5 proximal distal Right lower extremity: Hip flexion, knee extension 2+/5, ankle dorsiflexion 0/5, wiggles toes (baseline) Left lower extremity: Hip flexion, knee extension 1/5, ankle dorsiflexion 0/5, wiggles toes (baseline)  Assessment/Plan: 1. Functional deficits secondary to multiple sclerosis? exacerbation which require 3+ hours per day of interdisciplinary therapy in a comprehensive inpatient rehab setting.  Physiatrist is providing close team supervision and 24 hour management of active medical problems listed below.  Physiatrist and rehab team continue to assess barriers to discharge/monitor patient progress toward functional and medical goals  Care Tool:  Bathing     Body parts bathed by patient: Front perineal area, Buttocks         Bathing assist Assist Level: Minimal Assistance - Patient > 75%     Upper Body Dressing/Undressing Upper body dressing   What is the patient wearing?: Pull over shirt    Upper body assist Assist Level: Minimal Assistance - Patient > 75%    Lower Body Dressing/Undressing Lower body dressing      What is the patient wearing?: Pants, Incontinence brief     Lower body assist Assist for lower body dressing: Minimal Assistance - Patient > 75%     Toileting Toileting    Toileting assist Assist for toileting: Total Assistance - Patient < 25%     Transfers Chair/bed transfer  Transfers assist     Chair/bed transfer assist level: Minimal Assistance - Patient > 75%     Locomotion Ambulation   Ambulation assist   Ambulation activity did not occur: N/A(did not perform prior to admission)          Walk 10 feet activity   Assist  Walk 10 feet activity did not occur: N/A        Walk 50 feet activity   Assist Walk 50 feet with 2 turns activity did not occur: N/A         Walk 150 feet activity   Assist Walk 150 feet activity did not occur: N/A         Walk 10 feet on uneven surface  activity   Assist Walk 10 feet on uneven surfaces activity did not occur: N/A  Wheelchair     Assist Will patient use wheelchair at discharge?: Yes Type of Wheelchair: Power Wheelchair activity did not occur: Safety/medical concerns  Wheelchair assist level: Supervision/Verbal cueing Max wheelchair distance: 150 ft    Wheelchair 50 feet with 2 turns activity    Assist    Wheelchair 50 feet with 2 turns activity did not occur: Safety/medical concerns   Assist Level: Supervision/Verbal cueing   Wheelchair 150 feet activity     Assist Wheelchair 150 feet activity did not occur: Safety/medical concerns   Assist Level: Supervision/Verbal cueing      Medical  Problem List and Plan: 1.  Decreased functional mobility with severe vertigo secondary to MS exacerbation.  Patient completed 3-day course of Solu-Medrol.  Patient will need to be established with neurology on outpatient as she has not received follow-up since 2014             Continue CIR  12/21- will make sure gets vestibular rehab 2.  Antithrombotics: -DVT/anticoagulation: Lovenox             -antiplatelet therapy: N/A 3. Pain Management: Tylenol as needed 4. Mood: Provide emotional support             -antipsychotic agents: N/A 5. Neuropsych: This patient is capable of making decisions on her own behalf. 6. Skin/Wound Care: Routine skin checks 7. Fluids/Electrolytes/Nutrition: Routine in and outs.  CMP ordered for tomorrow. 8.  Vertigo.  Antivert PRN, monitor for efficacy  Discussed Scopolamine patch, however, will hold today given BP 9.  Neurogenic bowel/ bladder.  Continue Mandelamine for now.  Adjust bowel program.    PVRs ordered, pending 10.  Hypophosphatemia  Phosphorus 2.2 on 12/14, labs ordered for tomorrow 12.  Acute blood loss anemia  Hemoglobin 11.4 on 12/14, labs ordered for tomorrow 13.  Labile/soft blood pressure  Monitor with activity, asymptomatic at present  12/21- since here for vertigo, might be related- will discuss midodrine with pt.  14. Hyperkalemia  12/21- K+ 5.1- could be hemolysis which isn't noted because was running ~ 4.0 previously- will recheck this week. 15. Mild leukocytosis-  12/21- WBC is 11k, up from 9.4/ up from 5.6- no left shift at all, and afebrile- will con't to monitor and recheck later this week  LOS: 3 days A FACE TO FACE EVALUATION WAS PERFORMED  Jsean Taussig 12/05/2019, 10:14 AM

## 2019-12-05 NOTE — Progress Notes (Signed)
Occupational Therapy Session Note  Patient Details  Name: [name] MRN: [id number] Date of Birth: 18-Oct-1946  Today's Date: 12/05/2019 OT Individual Time: 7829-5621 OT Individual Time Calculation (min): 60 min    Short Term Goals: Week 1:  OT Short Term Goal 1 (Week 1): Pt will don LB clothing with min A (bed level or EOB) OT Short Term Goal 2 (Week 1): Pt will maintain static sitting balance with no more than 1 LOB during grooming task OT Short Term Goal 3 (Week 1): Pt will complete lateral scoot transfer with LRAD to [facility] with min A  Skilled Therapeutic Interventions/Progress Updates:    Patient in bed, alert and agreeable to therapy session.  LB bathing and dressing completed bed level.  She is unable to make it in time to commode to void - dependent to change wet brief.  LB bathing with min A for buttocks, LB dressing (to include socks and shoes) min A.  Clothing management mod A difficulty with reaching - may be due to air mattress.  Supine to SSP with min a.  SB transfer bed to power w/c with min A.  She is able to drive power chair after set up with CS and occ cues.  UB bathing and dressing at sink with set up.  Grooming tasks mod I at w/c level.  She remained in the w/c at close of session, call bell and tray table in reach.    Therapy Documentation Precautions:  Precautions Precautions: Fall Restrictions Weight Bearing Restrictions: No General:   Vital Signs:   Pain: Pain Assessment Pain Scale: 0-10 Pain Score: 0-No pain Other Treatments:     Therapy/Group: Individual Therapy  Carlos Levering 12/05/2019, 12:15 PM

## 2019-12-05 NOTE — Progress Notes (Signed)
Pt slept well during night. SBP remains above 95 at baseline. Continue to push po fluids. Round for toileting q 3 hours

## 2019-12-06 ENCOUNTER — Inpatient Hospital Stay (HOSPITAL_COMMUNITY): Payer: Medicare HMO | Admitting: Occupational Therapy

## 2019-12-06 ENCOUNTER — Inpatient Hospital Stay (HOSPITAL_COMMUNITY): Payer: Medicare HMO | Admitting: Physical Therapy

## 2019-12-06 MED ORDER — MIDODRINE HCL 5 MG PO TABS: 5.0000 mg | ORAL_TABLET | Freq: Two times a day (BID) | ORAL | Status: DC | PRN

## 2019-12-06 NOTE — Progress Notes (Signed)
Occupational Therapy Session Note  Patient Details  Name: [name] MRN: [id number] Date of Birth: February 16, 1946  Today's Date: 12/06/2019 OT Individual Time: 1000-1100 OT Individual Time Calculation (min): 60 min    Short Term Goals: Week 1:  OT Short Term Goal 1 (Week 1): Pt will don LB clothing with min A (bed level or EOB) OT Short Term Goal 2 (Week 1): Pt will maintain static sitting balance with no more than 1 LOB during grooming task OT Short Term Goal 3 (Week 1): Pt will complete lateral scoot transfer with LRAD to [facility] with min A  Skilled Therapeutic Interventions/Progress Updates:    Pt seen for OT session focusing on functional transfers and ADL re-training. Pt sitting up in power w/c upon arrival, agreeable to tx session and denying pain. Pt reports she had incontinent episode of urine and needing to return back to bed to change. Pt set-up w/c in prep for transfer to bed, supervision for management of power w/c and VCs for proper set-up of w/c in prep for transfer. CGA sliding board transfer to EOB with max cuing for head/hip relationship and anterior weight shift, though pt stating "I don't do it that way at home". Educated provided regarding high risk of sliding off board with posterior lean.  She completed bed mobility rolling R/L with use of bed rails and supervision to pull pants down. Pt with incontinent bowel movement at bed level, assist for thoroughness of hygiene and donning of new brief. She pulled up pants at bed level with supervision. She transferred to sitting EOB with CGA using hospital bed functions. Sliding board transfer back to w/c in same manner as described above.  Completed grooming tasks from w/c level at sink with set-up. Extensive education/discussion regarding BSC toilet transfer. Pt refusing as "not the same as home". Trialed 2 different drop arm BSC (standard and heavy duty) and pt cont to refuse. Cont to educate and encourage pt to get to [facility] for  continent toileting tasks with encouragement for timed toileting. Pt left seated in power w/c at end of session,  and all needs in reach.   Therapy Documentation Precautions:  Precautions Precautions: Fall Restrictions Weight Bearing Restrictions: No   Therapy/Group: Individual Therapy  Marleena Shubert L 12/06/2019, 6:49 AM

## 2019-12-06 NOTE — Plan of Care (Signed)
  Problem: Consults Goal: RH GENERAL PATIENT EDUCATION Description: See Patient Education module for education specifics. Outcome: Progressing   Problem: RH BLADDER ELIMINATION Goal: RH STG MANAGE BLADDER WITH ASSISTANCE Description: STG Manage Bladder With Mod I Assistance Outcome: Progressing Goal: RH STG MANAGE BLADDER WITH MEDICATION WITH ASSISTANCE Description: STG Manage Bladder With Medication With Mod I Assistance. Outcome: Progressing   Problem: RH SKIN INTEGRITY Goal: RH STG MAINTAIN SKIN INTEGRITY WITH ASSISTANCE Description: STG Maintain Skin Integrity With Mod I Assistance. Outcome: Progressing   

## 2019-12-06 NOTE — Patient Care Conference (Signed)
Inpatient RehabilitationTeam Conference and Plan of Care Update Date: 12/06/2019   Time: 11:10 AM    Patient Name: [name]      Medical Record Number: [id number]  Date of Birth: September 02, 1946 Sex: Female         Room/Bed: 4M12C/4M12C-01 Payor Info: Payor: [employer] MEDICARE / Plan: [employer] MEDICARE HMO/PPO / Product Type: *No Product type* /    Admit Date/Time:  12/02/2019  4:44 PM  Primary Diagnosis:  Multiple sclerosis ([place])  Patient Active Problem List   Diagnosis Date Noted  . Labile blood pressure   . Arterial hypotension   . Vertigo   . Hypophosphatemia   . Acute blood loss anemia   . Vertigo as late effect of stroke   . Neurogenic bowel   . Neurogenic bladder   . Multiple sclerosis exacerbation ([place]) 11/27/2019  . Pressure injury of skin 11/27/2019  . Acute pain of left wrist 10/06/2019  . Advance directive discussed with patient 05/17/2018  . Paraplegia, unspecified ([place]) 07/02/2017  . Recurrent UTI 07/02/2017  . Cervical spinal stenosis 06/12/2016  . 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 Health care maintenance 10/18/2012  . Osteoporosis   . Nephrolithiasis   . Dyslipidemia 05/25/2008  . Multiple sclerosis ([place]) 05/24/2008  . MITRAL VALVE PROLAPSE, HX OF 05/24/2008    Expected Discharge Date: Expected Discharge Date: 11/15/19  Team Members Present: Physician leading conference: Dr. Courtney Heys Social Worker Present: Lennart Pall, LCSW Nurse Present: Other (comment)(Susan Truman Hayward) Case Manager: Karene Fry, RN PT Present: Excell Seltzer, PT OT Present: Amy Rounds, OT SLP Present: Jettie Booze, CF-SLP PPS Coordinator present : Gunnar Fusi, SLP     Current Status/Progress Goal Weekly Team Focus  Bowel/Bladder   pt urinates frequently, incontinent. needs increased rounds q 2-3 hours  improved continence   monitor q shift and prn   Swallow/Nutrition/ Hydration             ADL's   Min A sliding board transfers, min A LB dressing bed level, set-up UB  bathing/dressing and grooming from w/c level, toileting bed level with min A, toileting on BSC total A  Mod I overall  ADL re-training, functional transfers,functional activity tolerance and balance   Mobility   min A bed mobility, min to mod A SB vs lateral scoot transfer, Supervision power w/c mobility  mod I overall at w/c level  sitting balance, bed mobility, transfers, power w/c mobility   Communication             Safety/Cognition/ Behavioral Observations            Pain   no c/o pain or distress  pain less than 3  maintain pain control   Skin   skin CDI, aquacel for sacral protecton, on air bed  maintain skin integrity, no pressure areas  assess skin q shift and prn    Rehab Goals Patient on target to meet rehab goals: Yes *See Care Plan and progress notes for long and short-term goals.     Barriers to Discharge  Current Status/Progress Possible Resolutions Date Resolved   Nursing                  PT  Decreased caregiver support;Incontinence  lives alone with intermittent assist with errands from her son; frequent urination              OT Incontinence                SLP  SW                Discharge Planning/Teaching Needs:  Pt to d/c home with intermittent assist from son (if she allows this)  teaching needs TBD   Team Discussion: Paraplegia due to test done in the 1980's, low BP, vertigo better, added mididrine, monitoring labs, mild leurocytosis.  RN can DC IV per MD, inc B/B, LLE weakness.  OT bottom looks good, CGA slide board, set up/min A ADLs, total toileting, goals mod I, recommend S.  PT min a bed, slideboard min to mod, S power chair, recommend S at home.   Revisions to Treatment Plan: N/A     Medical Summary Current Status: wearing TEDs- incontient of Bowel and bladder- Weekly Focus/Goal: no redness, issues on backside; try to get out of air mattress- hard to transfer  Barriers to Discharge: Decreased family/caregiver support;Home  enviroment access/layout;Medical stability;Weight;Other (comments);Incontinence;Neurogenic Bowel & Bladder  Barriers to Discharge Comments: paraplegia due to MS??? vertigo Possible Resolutions to Barriers: vertigo evaluation/otolith moving; supervision to mod I goals- S for w/c   Continued Need for Acute Rehabilitation Level of Care: The patient requires daily medical management by a physician with specialized training in physical medicine and rehabilitation for the following reasons: Direction of a multidisciplinary physical rehabilitation program to maximize functional independence : Yes Medical management of patient stability for increased activity during participation in an intensive rehabilitation regime.: Yes Analysis of laboratory values and/or radiology reports with any subsequent need for medication adjustment and/or medical intervention. : Yes   I attest that I was present, lead the team conference, and concur with the assessment and plan of the team.   Trish Mage 12/06/2019, 8:54 PM  Team conference was held via web/ teleconference due to COVID - 19

## 2019-12-06 NOTE — Progress Notes (Addendum)
Pt states she only slept 2 hours overnight due to her bed being flat, when inquired why she didn't call for assistance or ask the staff to assist her while they were in there during the night she stated "well I called for an hour and a half at beginning of shift but no one ever came" she states no one came to her room between 530pm and 830pm......Marland Kitchenhowever I explained to Mrs. Lenker a tech has been in her room to change her every 2 hours as we had previously planned. She was changed at 545p, 730 and 820 which was charted in realtime and verified by the nurse techs.  We have continued to round on Ms Heiner q 2-3 hours throughout the night and she has called a few times to assist her in changing. We discussed how we can make her feel more comfortable as well as sticking to the q2 hour rounding for her toileting as she does urinate quite frequently. I have also encouraged her that if she feels like she is not getting attention in a timely manner to recall and ask for her nurse.

## 2019-12-06 NOTE — Progress Notes (Addendum)
Physical Therapy Session Note  Patient Details  Name: [name] MRN: [id number] Date of Birth: 10-15-1946  Today's Date: 12/06/2019 PT Individual Time: 3419-6222 PT Individual Time Calculation (min): 31 min   Short Term Goals: Week 1:  PT Short Term Goal 1 (Week 1): Pt will perform bed mobility with min A  PT Short Term Goal 2 (Week 1): Pt will perform least restrictive transfer with min A PT Short Term Goal 3 (Week 1): Pt will initiate power w/c mobility  Skilled Therapeutic Interventions/Progress Updates:  Pt received in power w/c & agreeable to tx. Pt maneuvers poewr w/c around unit with supervision. Pt reporting she does not like using the slide board and is fearful of getting a pressure sore - educated pt that she should not get a pressure sore from using board and if she's wearing clothing while transferring with board this should minimize shearing forces. Pt still adamant she does not like using board - will notify primary PT. Pt reports she either uses a slide board at home to transfer to her son's car or he will lift her into his truck. Pt completes slide board transfer w/c<>low sedan simulated car with mod assist with poor demonstration of head/hips relationship and requiring assistance to place BLE in/out of car. Back in room pt transfers w/c>bed with min assist with use of slide board. Therapist provides cuing before all transfers for management of w/c armrest & seat belt. Pt transfers sit>supine with CGA with pt managing BLE with BUE. Pt then physically presses on bladder to empty it. Pt rolls L<>R with supervision & bed rails to allow therapist to change brief 2/2 voiding bladder. Therapist assists with clothing management and pt performs frontal peri hygiene. Pt with small red area to R buttocks & RN made aware & assessed area. Pt left in bed with alarm set & all needs at hand.  Therapy Documentation Precautions:  Precautions Precautions: Fall Restrictions Weight Bearing  Restrictions: No  Pain: Pt c/o buttocks pain when transferring w/c>bed with slide board - repositioning provided for pain relief.  Therapy/Group: Individual Therapy  Waunita Schooner 12/06/2019, 3:45 PM

## 2019-12-06 NOTE — Progress Notes (Signed)
Physical Therapy Session Note  Patient Details  Name: [name] MRN: [id number] Date of Birth: 06/29/1946  Today's Date: 12/06/2019 PT Individual Time: 0800-0915; 1130-1200; 1300-1400 PT Individual Time Calculation (min): 75 min and 30 min and 60 min  Short Term Goals: Week 1:  PT Short Term Goal 1 (Week 1): Pt will perform bed mobility with min A  PT Short Term Goal 2 (Week 1): Pt will perform least restrictive transfer with min A PT Short Term Goal 3 (Week 1): Pt will initiate power w/c mobility  Skilled Therapeutic Interventions/Progress Updates:    Session 1: Pt received seated in bed, agreeable to PT session. No complaints of pain but pt does report not sleeping well due to positioning in the bed. Encouraged pt to call for assist and to make sure bed controls left within her reach so she can position bed independently. Pt is able to change shirt, pants, socks and shoes at bed level with setup A with assist for positioning bed. Pt is able to bathe her UB with setup A. Supine to sit with min A with HOB elevated. Pt is able to maintain sitting balance EOB with close SBA with air mattress deflated. Slide board transfer bed to power w/c with min A with increased time needed, max cueing for anterior trunk lean and head/hips relationship during transfer. Once seated in w/c discussed pressure relief techniques including tilt and recline feature on power w/c and lateral leans. Pt is able to perform both techniques with cueing. Also discussed pressure relief schedule (every 30 min for 2 min). Reviewed power w/c controls with min cueing. Pt left seated in power w/c in room with needs in reach at end of session.  Session 2: Pt received seated in w/c in room, agreeable to PT session. No complaints of pain. Slide board transfer w/c to bed with min A. Slide board transfer bed to heavy duty BSC with slide board and min A. Pt is able to perform push-up with BUE on commode armrests to clear buttocks for  safe slide board removal. Lateral scoot transfer back to bed with min A. Sit to supine CGA. Pt reports urinary incontinence. Rolling L/R with Supervision, for dependent brief change, setup A for pericare. Pt left semi-reclined in bed with needs at end of session.  Session 3: Pt received seated in bed, agreeable to PT session. No complaints of pain. Supine to sit with min A. Slide board transfer to heavy duty BSC. Pt has incontinence of urine during transfer. Pt is max A to doff pants and brief while seated on commode with use of lateral leans and BUE press-ups. Per pt report she normally performs clothing management and pericare at bed level but agreeable to attempt on commode. Pt is able to continently void as well once seated on commode. Pt is setup A for pericare, dependent for donning new brief and pulling up pants. Lateral scoot transfer back to bed with min A. Lateral scoot transfer to w/c with min A, increased time and cues needed. Power w/c mobility x 150 ft with Supervision assist. Lateral scoot transfer w/c to/from mat table with min A. Seated balance EOM performing ball circle pass around trunk on mat table front to back x 5 reps each direction. Lateral scoot transfer back to w/c with min A. Pt left seated in w/c in room with needs in reach at end of session.  Therapy Documentation Precautions:  Precautions Precautions: Fall Restrictions Weight Bearing Restrictions: No    Therapy/Group: Individual  Therapy   Excell Seltzer, PT, DPT  12/06/2019, 12:47 PM

## 2019-12-06 NOTE — Progress Notes (Signed)
[facility] PHYSICAL MEDICINE & REHABILITATION PROGRESS NOTE  Subjective/Complaints:   Pt reports no dizziness yet this AM- PT thinks it's been "fixed" since got maneuver done with PT and then had vertigo evaluation yesterday which was (-)- and no dizziness since then.  Pt wants to go home- willing ot take Midodrine at home as NEEDED if has lightheadedness or BP <90/60  ROS: + Dizziness, vertigo much improved.  Denies CP, shortness of breath, nausea, vomiting, diarrhea.  Objective: Vital Signs: Blood pressure (!) 99/59, pulse 67, temperature 98.3 F (36.8 C), resp. rate 19, SpO2 95 %. No results found. Recent Labs    12/05/19 0544  WBC 11.0*  HGB 13.0  HCT 39.7  PLT 376   Recent Labs    12/05/19 0544  NA 142  K 5.1  CL 105  CO2 29  GLUCOSE 89  BUN 15  CREATININE 0.59  CALCIUM 8.8*    Physical Exam: BP (!) 99/59 (BP Location: Left Arm)   Pulse 67   Temp 98.3 F (36.8 C)   Resp 19   SpO2 95%  Constitutional: No distress . Vital signs reviewed. Laying in bed; appears comfortable, supine, PT in room; NAD HENT: Normocephalic.  Atraumatic. Eyes: EOMI. No discharge. Cardiovascular: RRR; No JVD. Respiratory: Normal effort.  No stridor. CTA B/L GI: Non-distended.soft, NT, ND Skin: Warm and dry.  Intact. Psych: Normal mood.  Normal behavior. Musc: No edema in extremities.  No tenderness in extremities. Neurological: Alert Motor: Bilateral upper extremities: 5 /5 proximal distal Right lower extremity: Hip flexion, knee extension 2+/5, ankle dorsiflexion 0/5, wiggles toes (baseline) Left lower extremity: Hip flexion, knee extension 1/5, ankle dorsiflexion 0/5, wiggles toes (baseline)  Assessment/Plan: 1. Functional deficits secondary to multiple sclerosis? exacerbation which require 3+ hours per day of interdisciplinary therapy in a comprehensive inpatient rehab setting.  Physiatrist is providing close team supervision and 24 hour management of active medical  problems listed below.  Physiatrist and rehab team continue to assess barriers to discharge/monitor patient progress toward functional and medical goals  Care Tool:  Bathing    Body parts bathed by patient: Right arm, Left arm, Chest, Abdomen, Front perineal area, Right upper leg, Left upper leg, Right lower leg, Left lower leg, Face   Body parts bathed by helper: Buttocks     Bathing assist Assist Level: Minimal Assistance - Patient > 75%     Upper Body Dressing/Undressing Upper body dressing   What is the patient wearing?: Pull over shirt    Upper body assist Assist Level: Supervision/Verbal cueing    Lower Body Dressing/Undressing Lower body dressing      What is the patient wearing?: Pants, Incontinence brief     Lower body assist Assist for lower body dressing: Minimal Assistance - Patient > 75%     Toileting Toileting    Toileting assist Assist for toileting: Total Assistance - Patient < 25%     Transfers Chair/bed transfer  Transfers assist     Chair/bed transfer assist level: Moderate Assistance - Patient 50 - 74%     Locomotion Ambulation   Ambulation assist   Ambulation activity did not occur: N/A(did not perform prior to admission)          Walk 10 feet activity   Assist  Walk 10 feet activity did not occur: N/A        Walk 50 feet activity   Assist Walk 50 feet with 2 turns activity did not occur: N/A  Walk 150 feet activity   Assist Walk 150 feet activity did not occur: N/A         Walk 10 feet on uneven surface  activity   Assist Walk 10 feet on uneven surfaces activity did not occur: N/A         Wheelchair     Assist Will patient use wheelchair at discharge?: Yes Type of Wheelchair: Power Wheelchair activity did not occur: Safety/medical concerns  Wheelchair assist level: Supervision/Verbal cueing Max wheelchair distance: 150 ft    Wheelchair 50 feet with 2 turns  activity    Assist    Wheelchair 50 feet with 2 turns activity did not occur: Safety/medical concerns   Assist Level: Supervision/Verbal cueing   Wheelchair 150 feet activity     Assist Wheelchair 150 feet activity did not occur: Safety/medical concerns   Assist Level: Supervision/Verbal cueing      Medical Problem List and Plan: 1.  Decreased functional mobility with severe vertigo secondary to MS exacerbation.  Patient completed 3-day course of Solu-Medrol.  Patient will need to be established with neurology on outpatient as she has not received follow-up since 2014             Continue CIR  12/21- will make sure gets vestibular rehab evaluation  12/22- done- was (-) but thinks put otolith back prior to evaluation with PT.   2.  Antithrombotics: -DVT/anticoagulation: Lovenox             -antiplatelet therapy: N/A 3. Pain Management: Tylenol as needed 4. Mood: Provide emotional support             -antipsychotic agents: N/A 5. Neuropsych: This patient is capable of making decisions on her own behalf. 6. Skin/Wound Care: Routine skin checks 7. Fluids/Electrolytes/Nutrition: Routine in and outs.  CMP ordered for tomorrow. 8.  Vertigo.  Antivert PRN, monitor for efficacy  Discussed Scopolamine patch, however, will hold today given BP 9.  Neurogenic bowel/ bladder.  Continue Mandelamine for now.  Adjust bowel program.    PVRs ordered, pending 10.  Hypophosphatemia  Phosphorus 2.2 on 12/14, labs ordered for tomorrow 12.  Acute blood loss anemia  Hemoglobin 11.4 on 12/14, labs ordered for tomorrow 13.  Labile/soft blood pressure  Monitor with activity, asymptomatic at present  12/21- since here for vertigo, might be related- will discuss midodrine with pt.  12/22- pt willing to take BP at home and take midodrine if BP <90/60 or lightheaded.  14. Hyperkalemia  12/21- K+ 5.1- could be hemolysis which isn't noted because was running ~ 4.0 previously- will recheck this  week.  12/22- labs in AM 15. Mild leukocytosis-  12/21- WBC is 11k, up from 9.4/ up from 5.6- no left shift at all, and afebrile- will con't to monitor and recheck later this week  12/22- labs in AM  LOS: 4 days A FACE TO FACE EVALUATION WAS PERFORMED  Benjermin Korber 12/06/2019, 9:34 AM

## 2019-12-07 ENCOUNTER — Inpatient Hospital Stay (HOSPITAL_COMMUNITY): Payer: Medicare HMO | Admitting: Physical Therapy

## 2019-12-07 ENCOUNTER — Inpatient Hospital Stay (HOSPITAL_COMMUNITY): Payer: Medicare HMO | Admitting: Occupational Therapy

## 2019-12-07 LAB — CBC WITH DIFFERENTIAL/PLATELET
Abs Immature Granulocytes: 0.1 10*3/uL — ABNORMAL HIGH (ref 0.00–0.07)
Basophils Absolute: 0 10*3/uL (ref 0.0–0.1)
Basophils Relative: 0 %
Eosinophils Absolute: 0.2 10*3/uL (ref 0.0–0.5)
Eosinophils Relative: 2 %
HCT: 39.9 % (ref 36.0–46.0)
Hemoglobin: 12.9 g/dL (ref 12.0–15.0)
Immature Granulocytes: 1 %
Lymphocytes Relative: 34 %
Lymphs Abs: 3 10*3/uL (ref 0.7–4.0)
MCH: 31.4 pg (ref 26.0–34.0)
MCHC: 32.3 g/dL (ref 30.0–36.0)
MCV: 97.1 fL (ref 80.0–100.0)
Monocytes Absolute: 1 10*3/uL (ref 0.1–1.0)
Monocytes Relative: 11 %
Neutro Abs: 4.7 10*3/uL (ref 1.7–7.7)
Neutrophils Relative %: 52 %
Platelets: 381 10*3/uL (ref 150–400)
RBC: 4.11 MIL/uL (ref 3.87–5.11)
RDW: 12.7 % (ref 11.5–15.5)
WBC: 9 10*3/uL (ref 4.0–10.5)
nRBC: 0 % (ref 0.0–0.2)

## 2019-12-07 LAB — BASIC METABOLIC PANEL
Anion gap: 7 (ref 5–15)
BUN: 14 mg/dL (ref 8–23)
CO2: 30 mmol/L (ref 22–32)
Calcium: 8.9 mg/dL (ref 8.9–10.3)
Chloride: 102 mmol/L (ref 98–111)
Creatinine, Ser: 0.66 mg/dL (ref 0.44–1.00)
GFR calc Af Amer: >60 mL/min (ref >60)
GFR calc non Af Amer: >60 mL/min (ref >60)
Glucose, Bld: 89 mg/dL (ref 70–99)
Potassium: 4.1 mmol/L (ref 3.5–5.1)
Sodium: 139 mmol/L (ref 135–145)

## 2019-12-07 NOTE — Plan of Care (Signed)
  Problem: Consults Goal: RH GENERAL PATIENT EDUCATION Description: See Patient Education module for education specifics. Outcome: Progressing   Problem: RH BLADDER ELIMINATION Goal: RH STG MANAGE BLADDER WITH ASSISTANCE Description: STG Manage Bladder With Mod I Assistance Outcome: Progressing Goal: RH STG MANAGE BLADDER WITH MEDICATION WITH ASSISTANCE Description: STG Manage Bladder With Medication With Mod I Assistance. Outcome: Progressing   Problem: RH SKIN INTEGRITY Goal: RH STG MAINTAIN SKIN INTEGRITY WITH ASSISTANCE Description: STG Maintain Skin Integrity With Mod I Assistance. Outcome: Progressing   

## 2019-12-07 NOTE — Progress Notes (Signed)
Occupational Therapy Session Note  Patient Details  Name: [name] MRN: [id number] Date of Birth: 03-May-1946  Today's Date: 12/07/2019 OT Individual Time: 5732-2025 OT Individual Time Calculation (min): 72 min    Short Term Goals: Week 1:  OT Short Term Goal 1 (Week 1): Pt will don LB clothing with min A (bed level or EOB) OT Short Term Goal 2 (Week 1): Pt will maintain static sitting balance with no more than 1 LOB during grooming task OT Short Term Goal 3 (Week 1): Pt will complete lateral scoot transfer with LRAD to [facility] with min A  Skilled Therapeutic Interventions/Progress Updates:    P seen for OT ADL bathing/dressing session. Pt awake in supine upon arrival having just finished breakfast, denying pain and agreeable to tx session.  Transfers: Supine>sitting EOB with close supervision/VCs for technique using hospital bed functions. Mod A sliding board transfer to heavy duty drop arm BSC, max VCs for anterior weight shift and hand placement with limited follow through by pt. Min A sliding board transfer back to bed with emphasis on clearing buttock during each progression across board. Min A board transfer to power w/c from EOB  ADL Re-training: Pt completed push-up on BSC in order for clothing management to be completed total A and new brief to be donned.  UB bathing/dressing completed while seated on BSC with supervision for dynamic sitting balance. Declined LB dressing, shoes donned with set-up bed level. Grooming tasks completed from power w/c level at sink with set-up.   Pt's son brought in modified sliding board, cushion and cover wrapped sliding board so " I don't get a pressure sore from the board". Pt cont to decline transitioning from air mattress to standard mattress despite education on ease of transfers and ADLs and to simulate home environment. Pt initially refused transfer to power w/c as "fear of getting pressure sore from chair". Reviewed pressure relief  schedule and obtained ROHO cushion to ease pt's mind regarding bed sore. She cont to demonstrate very poor mental flexibility in ability to carry over skills from rehab to home environment. Educated regarding therapy process and tx goals.  Pt left sitting up in power w/c at end of session, all needs in reach.   Therapy Documentation Precautions:  Precautions Precautions: Fall Restrictions Weight Bearing Restrictions: No   Therapy/Group: Individual Therapy  Freman Lapage L 12/07/2019, 6:58 AM

## 2019-12-07 NOTE — Progress Notes (Signed)
Physical Therapy Session Note  Patient Details  Name: [name] MRN: [id number] Date of Birth: 1946-09-22  Today's Date: 12/07/2019 PT Individual Time: 1110-1205; 1300-1330; 6962-9528 PT Individual Time Calculation (min): 55 min and 30 min and 30 min  Short Term Goals: Week 1:  PT Short Term Goal 1 (Week 1): Pt will perform bed mobility with min A  PT Short Term Goal 2 (Week 1): Pt will perform least restrictive transfer with min A PT Short Term Goal 3 (Week 1): Pt will initiate power w/c mobility  Skilled Therapeutic Interventions/Progress Updates:    Session 1: Pt received seated in w/c in room, agreeable to PT session. No complaints of pain. Power w/c mobility x 150 ft at Supervision level. Slide board transfer via pt's own slide board with min A for sitting balance. Due to material on top of patient's slide board she is unable to "slide" on it but must lift up buttocks by pressing up with use of BUE to prevent shearing. Sitting balance EOM table with volleyball performing circle pass around her trunk x 5 reps each direction, CGA for sitting balance. Seated chest press, OH lift, and L/R diagonals with volleyball reaching outside of BOS x 10 reps each, SBA for sitting balance. Pt progresses to using 2# weighted ball for same exercises x 10 reps each with CGA to min A to maintain sitting balance. Slide board transfer back to w/c then back to bed with min A. Sit to supine CGA. Rolling L/R at Supervision level for setup A pericare and dependent brief change following urinary incontinence. Pt left seated in bed with needs in reach at end of session.  Session 2: Pt received seated in bed, agreeable to PT session. No complaints of pain. Supine to sit with CGA. Slide board transfer bed to w/c power w/c with CGA to min A, v/c for head/hips relationship. Power w/c mobility x 150 ft at Supervision level. Reviewed power w/c controls and pressure relief techniques. Assessed Roho cushion that pt now  using in power w/c, added air for improved comfort and pressure relief. Also attempted to adjust neck rest on w/c for improved comfort and support when pt tilts chair back for pressure relief. Unfortunately due to patient's size unable to adjust neck rest on this power w/c. Pt left seated in w/c in room with needs in reach at end of session.  Session 3: Pt received seated in w/c in room, agreeable to PT session. No complaints of pain. Pt reports urinary incontinence. Slide board transfer back to bed with min A. Sit to supine Supervision. Rolling L/R with Supervision for setup A pericare, dependent for donning new brief. Pt left seated in bed with needs in reach at end of session. Provided pt with handout for [place] Maneuver performed by PT over the weekend. Pt to check her son's schedule to see when he is available to family education this weekend prior to discharge next week.   Therapy Documentation Precautions:  Precautions Precautions: Fall Restrictions Weight Bearing Restrictions: No    Therapy/Group: Individual Therapy   Excell Seltzer, PT, DPT  12/07/2019, 12:46 PM

## 2019-12-07 NOTE — Progress Notes (Signed)
[place] PHYSICAL MEDICINE & REHABILITATION PROGRESS NOTE  Subjective/Complaints:   Pt reports no more dizziness in last 2 days- no other issues- ready for therapy today.  ROS: (-) Dizziness, vertigo much improved.  Denies CP, shortness of breath, nausea, vomiting, diarrhea.  Objective: Vital Signs: Blood pressure 104/62, pulse 77, temperature 98.8 F (37.1 C), temperature source Oral, resp. rate 16, SpO2 100 %. No results found. Recent Labs    12/05/19 0544 12/07/19 0657  WBC 11.0* 9.0  HGB 13.0 12.9  HCT 39.7 39.9  PLT 376 381   Recent Labs    12/05/19 0544 12/07/19 0657  NA 142 139  K 5.1 4.1  CL 105 102  CO2 29 30  GLUCOSE 89 89  BUN 15 14  CREATININE 0.59 0.66  CALCIUM 8.8* 8.9    Physical Exam: BP 104/62 (BP Location: Left Arm)   Pulse 77   Temp 98.8 F (37.1 C) (Oral)   Resp 16   SpO2 100%  Constitutional: No distress . Vital signs reviewed. Laying in bed; appears comfortable, supine, nursing in room; NAD HENT: Normocephalic.  Atraumatic. Eyes: EOMI. No discharge. Cardiovascular: RRR; No JVD. Respiratory: Normal effort.  No stridor. CTA B/L GI: Non-distended.soft, NT, ND Skin: Warm and dry.  Intact. Psych: Normal mood.  Normal behavior. Musc: No edema in extremities.  No tenderness in extremities. Neurological: Alert Motor: Bilateral upper extremities: 5 /5 proximal distal Right lower extremity: Hip flexion, knee extension 2+/5, ankle dorsiflexion 0/5, wiggles toes (baseline) Left lower extremity: Hip flexion, knee extension 1/5, ankle dorsiflexion 0/5, wiggles toes (baseline)  Assessment/Plan: 1. Functional deficits secondary to multiple sclerosis? exacerbation which require 3+ hours per day of interdisciplinary therapy in a comprehensive inpatient rehab setting.  Physiatrist is providing close team supervision and 24 hour management of active medical problems listed below.  Physiatrist and rehab team continue to assess barriers to  discharge/monitor patient progress toward functional and medical goals  Care Tool:  Bathing    Body parts bathed by patient: Right arm, Left arm, Chest, Abdomen, Front perineal area, Buttocks   Body parts bathed by helper: Buttocks     Bathing assist Assist Level: Minimal Assistance - Patient > 75%     Upper Body Dressing/Undressing Upper body dressing   What is the patient wearing?: Pull over shirt    Upper body assist Assist Level: Supervision/Verbal cueing    Lower Body Dressing/Undressing Lower body dressing      What is the patient wearing?: Pants, Incontinence brief     Lower body assist Assist for lower body dressing: Minimal Assistance - Patient > 75%     Toileting Toileting    Toileting assist Assist for toileting: Maximal Assistance - Patient 25 - 49%     Transfers Chair/bed transfer  Transfers assist     Chair/bed transfer assist level: Minimal Assistance - Patient > 75%     Locomotion Ambulation   Ambulation assist   Ambulation activity did not occur: N/A(did not perform prior to admission)          Walk 10 feet activity   Assist  Walk 10 feet activity did not occur: N/A        Walk 50 feet activity   Assist Walk 50 feet with 2 turns activity did not occur: N/A         Walk 150 feet activity   Assist Walk 150 feet activity did not occur: N/A         Walk 10 feet on  uneven surface  activity   Assist Walk 10 feet on uneven surfaces activity did not occur: N/A         Wheelchair     Assist Will patient use wheelchair at discharge?: Yes Type of Wheelchair: Power Wheelchair activity did not occur: Safety/medical concerns  Wheelchair assist level: Supervision/Verbal cueing Max wheelchair distance: 150'    Wheelchair 50 feet with 2 turns activity    Assist    Wheelchair 50 feet with 2 turns activity did not occur: Safety/medical concerns   Assist Level: Supervision/Verbal cueing   Wheelchair  150 feet activity     Assist Wheelchair 150 feet activity did not occur: Safety/medical concerns   Assist Level: Supervision/Verbal cueing      Medical Problem List and Plan: 1.  Decreased functional mobility with severe vertigo secondary to MS exacerbation.  Patient completed 3-day course of Solu-Medrol.  Patient will need to be established with neurology on outpatient as she has not received follow-up since 2014             Continue CIR  12/21- will make sure gets vestibular rehab evaluation  12/22- done- was (-) but thinks put otolith back prior to evaluation with PT.   2.  Antithrombotics: -DVT/anticoagulation: Lovenox             -antiplatelet therapy: N/A 3. Pain Management: Tylenol as needed 4. Mood: Provide emotional support             -antipsychotic agents: N/A 5. Neuropsych: This patient is capable of making decisions on her own behalf. 6. Skin/Wound Care: Routine skin checks 7. Fluids/Electrolytes/Nutrition: Routine in and outs.  CMP ordered for tomorrow. 8.  Vertigo.  Antivert PRN, monitor for efficacy  Discussed Scopolamine patch, however, will hold today given BP 9.  Neurogenic bowel/ bladder.  Continue Mandelamine for now.  Adjust bowel program.    PVRs ordered, pending 10.  Hypophosphatemia  Phosphorus 2.2 on 12/14, labs ordered for tomorrow 12.  Acute blood loss anemia  Hemoglobin 11.4 on 12/14, labs ordered for tomorrow 13.  Labile/soft blood pressure  Monitor with activity, asymptomatic at present  12/21- since here for vertigo, might be related- will discuss midodrine with pt.  12/22- pt willing to take BP at home and take midodrine if BP <90/60 or lightheaded.  14. Hyperkalemia  12/21- K+ 5.1- could be hemolysis which isn't noted because was running ~ 4.0 previously- will recheck this week.  12/22- labs in AM  12/23- K+ 4.1 15. Mild leukocytosis-  12/21- WBC is 11k, up from 9.4/ up from 5.6- no left shift at all, and afebrile- will con't to monitor and  recheck later this week  12/22- labs in AM  12/23- down to 9.0k  LOS: 5 days A FACE TO FACE EVALUATION WAS PERFORMED  Carl Bleecker 12/07/2019, 3:56 PM

## 2019-12-07 NOTE — Progress Notes (Signed)
Patient refused today's Methenamine dose. Patient stated she only takes Methenamine on Tuesdays, Thursdays, and Saturdays. Provider was notified.

## 2019-12-08 ENCOUNTER — Inpatient Hospital Stay (HOSPITAL_COMMUNITY): Payer: Medicare HMO

## 2019-12-08 ENCOUNTER — Inpatient Hospital Stay (HOSPITAL_COMMUNITY): Payer: Medicare HMO | Admitting: Physical Therapy

## 2019-12-08 ENCOUNTER — Inpatient Hospital Stay (HOSPITAL_COMMUNITY): Payer: Medicare HMO | Admitting: Occupational Therapy

## 2019-12-08 DIAGNOSIS — G8220 Paraplegia, unspecified: Secondary | ICD-10-CM

## 2019-12-08 DIAGNOSIS — I69398 Other sequelae of cerebral infarction: Secondary | ICD-10-CM

## 2019-12-08 NOTE — Progress Notes (Signed)
Physical Therapy Session Note  Patient Details  Name: [name] MRN: [id number] Date of Birth: 1946-09-26  Today's Date: 12/08/2019 PT Individual Time: 1035-1105 PT Individual Time Calculation (min): 30 min   Short Term Goals: Week 1:  PT Short Term Goal 1 (Week 1): Pt will perform bed mobility with min A  PT Short Term Goal 2 (Week 1): Pt will perform least restrictive transfer with min A PT Short Term Goal 3 (Week 1): Pt will initiate power w/c mobility  Skilled Therapeutic Interventions/Progress Updates:    Modified independent for power w/c mobility on unit including set up and positioning for transfers. Performed all bed <> w/c transfers with min assist without slideboard (per patient request) with focus on increasing lift through bottom to decrease sheering. Trial with placement of footplates in down position to allow better reach to the floor and support. In unsupported sitting focused on NMR for trunk control and core activation exercises including pelvic dissociation (anterior/poster) with activation for upright posture x 10-15 reps with PT supporting UEs to monitor reliance on UE's and then 10-15 reps with pt's hands on lap with cues for scapular retraction once in upright position. Also educated on use of breath during these exercises. Holding lightweight ball engaged in PNF diagonals R and L with cues for technique and to follow with eyes to promote trunk/core activation. Due to pt reported incontinent urine episode due to exertion, returned to bed end of session and handoff to NT to assist with hygiene.   Therapy Documentation Precautions:  Precautions Precautions: Fall Restrictions Weight Bearing Restrictions: No    Pain:  Denies pain.    Therapy/Group: Individual Therapy  Canary Brim Ivory Broad, PT, DPT, CBIS  12/08/2019, 11:50 AM

## 2019-12-08 NOTE — Plan of Care (Signed)
  Problem: Consults Goal: RH GENERAL PATIENT EDUCATION Description: See Patient Education module for education specifics. Outcome: Progressing   Problem: RH BLADDER ELIMINATION Goal: RH STG MANAGE BLADDER WITH ASSISTANCE Description: STG Manage Bladder With Mod I Assistance Outcome: Progressing Goal: RH STG MANAGE BLADDER WITH MEDICATION WITH ASSISTANCE Description: STG Manage Bladder With Medication With Mod I Assistance. Outcome: Progressing   Problem: RH SKIN INTEGRITY Goal: RH STG MAINTAIN SKIN INTEGRITY WITH ASSISTANCE Description: STG Maintain Skin Integrity With Mod I Assistance. Outcome: Progressing   

## 2019-12-08 NOTE — Progress Notes (Signed)
Occupational Therapy Session Note  Patient Details  Name: [name] MRN: [id number] Date of Birth: 1946-08-15  Today's Date: 12/08/2019 OT Individual Time: 0650-0800 and 1300-1340 OT Individual Time Calculation (min): 70 min and 40 min   Short Term Goals: Week 1:  OT Short Term Goal 1 (Week 1): Pt will don LB clothing with min A (bed level or EOB) OT Short Term Goal 2 (Week 1): Pt will maintain static sitting balance with no more than 1 LOB during grooming task OT Short Term Goal 3 (Week 1): Pt will complete lateral scoot transfer with LRAD to [facility] with min A  Skilled Therapeutic Interventions/Progress Updates:    Session One: Pt seen for OT session focusing on functional sitting balance and ADL re-training. Pt awake in supine upon arrival, denying pain and agreeable to tx session and requesting to eat breakfast.  She donned shoes at bed level with set-up. Transferred to sitting EOB with min A. She ate breakfast seated on EOB for core strengthening/stability and facilitate functional positioning during task.  LB bathing/dressing completed from bed level as pt does at home. Completed from supine position with pt bringing LE up to her in order to bathe. Completed with set-up/supervision and VCs overall. She cont to have very poor mental flexibility abilities when it comes to ADLs in hospital environment with carry over to home situation.  Completed min-mod A sliding board transfer to heavy duty drop arm BSC. Completed push-up on BSC in order for therapist to complete clothing management total A and she was able to complete pericare hygiene with set-up/supervision. Sliding board transfer back to bed where pt had total posterior loss of balance onto bed, despite max cuing for anterior weight shift, safely transitioned back to supine on bed. Min A sliding board transfer to power w/c.  Pt left set-up at sink in power w/c to complete UB bathing and grooming tasks. NT made aware of pt's position.    Session Two: Pt seen for OT session focusing on functional transfers and mobility. Pt awake in supine upon arrival, agreeable to tx session and denying pain. Transferred to sitting EOB with supervision using hospital bed functions. Completed sliding board transfer with min-mod A and VCs to promote anterior weight shift and hand placement. Maneuvered power w/c throughout unit with supervision. In therapy gym, pt parked power w/c flush with EOM in simulation of home environment bed/chair relationship. Completed x4 Scoot transfers EOM<>power w/c. Min A initially with extensive education and demonstration of reason for anterior weight shift for skin protection during transfer with increased clearance and decrease risk of posterior LOB. Pt began to agree with recommendation of anterior weight shift, however, difficulty recalling techniques on subsequent trials. Completed supine<>EOM with supervision and increased time. Completed supine>long sitting with supervision and education regarding functional use of long sitting position. Pt returned to power w/c in same manner as above.  Returned to room and left seated in power w/c with all needs in reach, pt able to independently recall pressure relief schedule.   Therapy Documentation Precautions:  Precautions Precautions: Fall Restrictions Weight Bearing Restrictions: No   Therapy/Group: Individual Therapy  Izaha Shughart L 12/08/2019, 6:28 AM

## 2019-12-08 NOTE — Progress Notes (Signed)
Physical Therapy Session Note  Patient Details  Name: [name] MRN: [id number] Date of Birth: 12-19-1945  Today's Date: 12/08/2019 PT Individual Time: 0900-1000 PT Individual Time Calculation (min): 60 min   Short Term Goals: Week 1:  PT Short Term Goal 1 (Week 1): Pt will perform bed mobility with min A  PT Short Term Goal 2 (Week 1): Pt will perform least restrictive transfer with min A PT Short Term Goal 3 (Week 1): Pt will initiate power w/c mobility  Skilled Therapeutic Interventions/Progress Updates:    Pt received seated in power w/c in room, agreeable to PT session. No complaints of pain. Pt reports she has been incontinent of bowel and bladder in her brief. Discussed pt's incontinence and problem-solved how she can manage toileting and pericare upon d/c home. Slide board transfer w/c to bed with CGA for sitting balance during transfer. Sit to supine CGA. Rolling L/R with Supervision for dependent brief change, setup A pericare, and Supervision clothing management. Supine to sit CGA. Slide board transfer back to w/c with CGA. Power w/c mobility at mod I level x 150 ft. Lateral scoot transfer w/c to/from mat table with min A for sitting balance as pt tends to fall posteriorly. Sit to/from supine on flat mat table with CGA, use of chair as "bedrail" as pt uses bedpost at home to assist with transfer. Pt reports she dons her shoes either supine in bed or seated EOB. Discussed safest method for donning shoes based on current level of sitting balance. Pt would be safer to don shoes in supine but then has increased difficulty managing BLE to come to sitting as her feet get caught on the bed. Reviewed how to perform figure 4 sitting EOB in order to don shoes and leaving shoes in an easy to reach place from EOB. Pt is also able to perform rolling L/R on flat mat table at Supervision level. Pt left seated in w/c in room with needs in reach at end of session.  Therapy  Documentation Precautions:  Precautions Precautions: Fall Restrictions Weight Bearing Restrictions: No    Therapy/Group: Individual Therapy   Excell Seltzer, PT, DPT  12/08/2019, 12:04 PM

## 2019-12-08 NOTE — Progress Notes (Signed)
[name] PHYSICAL MEDICINE & REHABILITATION PROGRESS NOTE  Subjective/Complaints: Patient seen sitting up in bed this AM, working with therapies. She states she slept well overnight.  She notes dizziness has significantly improved.  She states she does not like the taste of water, discussed hydration with patient and therapies.  ROS: Denies CP, shortness of breath, nausea, vomiting, diarrhea.  Objective: Vital Signs: Blood pressure 104/66, pulse 68, temperature 97.9 F (36.6 C), temperature source Oral, resp. rate 18, SpO2 97 %. No results found. Recent Labs    12/07/19 0657  WBC 9.0  HGB 12.9  HCT 39.9  PLT 381   Recent Labs    12/07/19 0657  NA 139  K 4.1  CL 102  CO2 30  GLUCOSE 89  BUN 14  CREATININE 0.66  CALCIUM 8.9    Physical Exam: BP 104/66 (BP Location: Left Arm)   Pulse 68   Temp 97.9 F (36.6 C) (Oral)   Resp 18   SpO2 97%  Constitutional: No distress . Vital signs reviewed. HENT: Normocephalic.  Atraumatic. Eyes: EOMI. No discharge. Cardiovascular: No JVD. Respiratory: Normal effort.  No stridor. GI: Non-distended. Skin: Warm and dry.  Intact. Psych: Normal mood.  Normal behavior. Musc: No edema in extremities.  No tenderness in extremities. Neurological: Alert Motor: Bilateral upper extremities: 5 /5 proximal distal, unchanged Right lower extremity: Hip flexion, knee extension 2/5, ankle dorsiflexion 0/5, wiggles toes (baseline) Left lower extremity: Hip flexion, knee extension 1/5, ankle dorsiflexion 0/5, wiggles toes (baseline)  Assessment/Plan: 1. Functional deficits secondary to multiple sclerosis? exacerbation which require 3+ hours per day of interdisciplinary therapy in a comprehensive inpatient rehab setting.  Physiatrist is providing close team supervision and 24 hour management of active medical problems listed below.  Physiatrist and rehab team continue to assess barriers to discharge/monitor patient progress toward functional and  medical goals  Care Tool:  Bathing    Body parts bathed by patient: Right arm, Left arm, Chest, Abdomen, Front perineal area, Buttocks   Body parts bathed by helper: Buttocks     Bathing assist Assist Level: Minimal Assistance - Patient > 75%     Upper Body Dressing/Undressing Upper body dressing   What is the patient wearing?: Pull over shirt    Upper body assist Assist Level: Supervision/Verbal cueing    Lower Body Dressing/Undressing Lower body dressing      What is the patient wearing?: Pants, Incontinence brief     Lower body assist Assist for lower body dressing: Minimal Assistance - Patient > 75%     Toileting Toileting    Toileting assist Assist for toileting: Maximal Assistance - Patient 25 - 49%     Transfers Chair/bed transfer  Transfers assist     Chair/bed transfer assist level: Minimal Assistance - Patient > 75%     Locomotion Ambulation   Ambulation assist   Ambulation activity did not occur: N/A(did not perform prior to admission)          Walk 10 feet activity   Assist  Walk 10 feet activity did not occur: N/A        Walk 50 feet activity   Assist Walk 50 feet with 2 turns activity did not occur: N/A         Walk 150 feet activity   Assist Walk 150 feet activity did not occur: N/A         Walk 10 feet on uneven surface  activity   Assist Walk 10 feet on uneven surfaces  activity did not occur: N/A         Wheelchair     Assist Will patient use wheelchair at discharge?: Yes Type of Wheelchair: Power Wheelchair activity did not occur: Safety/medical concerns  Wheelchair assist level: Supervision/Verbal cueing Max wheelchair distance: 150'    Wheelchair 50 feet with 2 turns activity    Assist    Wheelchair 50 feet with 2 turns activity did not occur: Safety/medical concerns   Assist Level: Supervision/Verbal cueing   Wheelchair 150 feet activity     Assist Wheelchair 150 feet  activity did not occur: Safety/medical concerns   Assist Level: Supervision/Verbal cueing      Medical Problem List and Plan: 1.  Decreased functional mobility with severe vertigo secondary to MS exacerbation.  Patient completed 3-day course of Solu-Medrol.  Patient will need to be established with neurology on outpatient as she has not received follow-up since 2014             Continue CIR 2.  Antithrombotics: -DVT/anticoagulation: Lovenox             -antiplatelet therapy: N/A 3. Pain Management: Tylenol as needed 4. Mood: Provide emotional support             -antipsychotic agents: N/A 5. Neuropsych: This patient is capable of making decisions on her own behalf. 6. Skin/Wound Care: Routine skin checks 7. Fluids/Electrolytes/Nutrition: Routine in and outs.   8.  Vertigo.  Antivert PRN, monitor for efficacy  Improving 9.  Neurogenic bowel/ bladder.  Continue Mandelamine for now.  Adjust bowel program.    PVRs ordered, remain pending, ordered again 10.  Hypophosphatemia  Phosphorus 3.8 on 12/1 12.  Acute blood loss anemia: Resolved  Hemoglobin 12.9 on 12/23 13.  Labile/soft blood pressure  Monitor with activity, asymptomatic at present  Pt willing to take BP at home and take midodrine if BP <90/60 or lightheaded.    Discussed importance of oral hydration with water 14. Hyperkalemia: Resolved  Potassium 4.1 on 12/23 15. Mild leukocytosis: Resolved  WBC 9.0 on 12/23  LOS: 6 days A FACE TO FACE EVALUATION WAS PERFORMED  [name] [name] 12/08/2019, 8:16 AM

## 2019-12-09 DIAGNOSIS — N319 Neuromuscular dysfunction of bladder, unspecified: Secondary | ICD-10-CM

## 2019-12-09 NOTE — Progress Notes (Signed)
[place] PHYSICAL MEDICINE & REHABILITATION PROGRESS NOTE  Subjective/Complaints: Patient seen sitting up in bed this time.  He states he slept well overnight.  She has questions regarding urinary frequency, medications, infection.  ROS: Denies CP, shortness of breath, nausea, vomiting, diarrhea.  Objective: Vital Signs: Blood pressure 118/74, pulse 67, temperature 98.2 F (36.8 C), resp. rate 19, SpO2 97 %. No results found. Recent Labs    12/07/19 0657  WBC 9.0  HGB 12.9  HCT 39.9  PLT 381   Recent Labs    12/07/19 0657  NA 139  K 4.1  CL 102  CO2 30  GLUCOSE 89  BUN 14  CREATININE 0.66  CALCIUM 8.9    Physical Exam: BP 118/74 (BP Location: Left Arm)   Pulse 67   Temp 98.2 F (36.8 C)   Resp 19   SpO2 97%  Constitutional: No distress . Vital signs reviewed. HENT: Normocephalic.  Atraumatic. Eyes: EOMI. No discharge. Cardiovascular: No JVD. Respiratory: Normal effort.  No stridor. GI: Non-distended. Skin: Warm and dry.  Intact. Psych: Normal mood.  Normal behavior. Musc: No edema in extremities.  No tenderness in extremities. Neurological: Alert  Motor: Bilateral upper extremities: 5 /5 proximal distal, stable right lower extremity: Hip flexion, knee extension 2/5, ankle dorsiflexion 0/5, wiggles toes (baseline, unchanged) Left lower extremity: Hip flexion, knee extension 1/5, ankle dorsiflexion 0/5, wiggles toes (baseline, unchanged)  Assessment/Plan: 1. Functional deficits secondary to multiple sclerosis? exacerbation which require 3+ hours per day of interdisciplinary therapy in a comprehensive inpatient rehab setting.  Physiatrist is providing close team supervision and 24 hour management of active medical problems listed below.  Physiatrist and rehab team continue to assess barriers to discharge/monitor patient progress toward functional and medical goals  Care Tool:  Bathing    Body parts bathed by patient: Right arm, Left arm, Chest, Abdomen,  Front perineal area, Buttocks   Body parts bathed by helper: Buttocks     Bathing assist Assist Level: Minimal Assistance - Patient > 75%     Upper Body Dressing/Undressing Upper body dressing   What is the patient wearing?: Pull over shirt    Upper body assist Assist Level: Supervision/Verbal cueing    Lower Body Dressing/Undressing Lower body dressing      What is the patient wearing?: Pants, Incontinence brief     Lower body assist Assist for lower body dressing: Minimal Assistance - Patient > 75%     Toileting Toileting    Toileting assist Assist for toileting: Maximal Assistance - Patient 25 - 49%     Transfers Chair/bed transfer  Transfers assist     Chair/bed transfer assist level: Minimal Assistance - Patient > 75%     Locomotion Ambulation   Ambulation assist   Ambulation activity did not occur: N/A(did not perform prior to admission)          Walk 10 feet activity   Assist  Walk 10 feet activity did not occur: N/A        Walk 50 feet activity   Assist Walk 50 feet with 2 turns activity did not occur: N/A         Walk 150 feet activity   Assist Walk 150 feet activity did not occur: N/A         Walk 10 feet on uneven surface  activity   Assist Walk 10 feet on uneven surfaces activity did not occur: N/A         Wheelchair  Assist Will patient use wheelchair at discharge?: Yes Type of Wheelchair: Power Wheelchair activity did not occur: Safety/medical concerns  Wheelchair assist level: Independent Max wheelchair distance: 150'    Wheelchair 50 feet with 2 turns activity    Assist    Wheelchair 50 feet with 2 turns activity did not occur: Safety/medical concerns   Assist Level: Independent   Wheelchair 150 feet activity     Assist Wheelchair 150 feet activity did not occur: Safety/medical concerns   Assist Level: Independent      Medical Problem List and Plan: 1.  Decreased functional  mobility with severe vertigo secondary to MS exacerbation.  Patient completed 3-day course of Solu-Medrol.  Patient will need to be established with neurology on outpatient as she has not received follow-up since 2014             Continue CIR 2.  Antithrombotics: -DVT/anticoagulation: Lovenox             -antiplatelet therapy: N/A 3. Pain Management: Tylenol as needed 4. Mood: Provide emotional support             -antipsychotic agents: N/A 5. Neuropsych: This patient is capable of making decisions on her own behalf. 6. Skin/Wound Care: Routine skin checks 7. Fluids/Electrolytes/Nutrition: Routine in and outs.   8.  Vertigo.  Antivert PRN, monitor for efficacy  Improving 9.  Neurogenic bowel/ bladder.  Continue Mandelamine for now.  Adjust bowel program.    PVRs ordered, only performed x1, with residual of 150, continue to monitor 10.  Hypophosphatemia  Phosphorus 3.8 on 12/1, labs ordered for Monday 12.  Acute blood loss anemia: Resolved  Hemoglobin 12.9 on 12/23 13.  Labile/soft blood pressure  Monitor with activity, asymptomatic at present  Pt willing to take BP at home and take midodrine if BP <90/60 or lightheaded.    Discussed importance of oral hydration with water  Controlled on 12/25 14. Hyperkalemia: Resolved  Potassium 4.1 on 12/23 15. Mild leukocytosis: Resolved  WBC 9.0 on 12/23  LOS: 7 days A FACE TO FACE EVALUATION WAS PERFORMED  Evonda Enge Karis Juba 12/09/2019, 12:46 PM

## 2019-12-10 ENCOUNTER — Inpatient Hospital Stay (HOSPITAL_COMMUNITY): Payer: Medicare HMO | Admitting: Physical Therapy

## 2019-12-10 ENCOUNTER — Inpatient Hospital Stay (HOSPITAL_COMMUNITY): Payer: Medicare HMO | Admitting: Occupational Therapy

## 2019-12-10 ENCOUNTER — Ambulatory Visit (HOSPITAL_COMMUNITY): Payer: Medicare HMO

## 2019-12-10 NOTE — Progress Notes (Signed)
Physical Therapy Weekly Progress Note  Patient Details  Name: [name] MRN: [id number] Date of Birth: 1946-04-27  Beginning of progress report period: December 03, 2019 End of progress report period: December 10, 2019  Today's Date: 12/10/2019 PT Individual Time: 6967-8938 PT Individual Time Calculation (min): 57 min   Patient has met 3 of 3 short term goals.  Pt progressing with functional mobility and is able to perform slideboard transfers with CGA-min assist, performs power w/c mobility independently and performs bed mobility with supervision-min assist. Pt has history of balance and strength impairments, she was w/c level prior to admission. She is limited in the hospital as she has specific equipment and strategies that she uses at home that can't be replicated here, however we have been working on simulated transfers as able. Pt initially came in with complaints of dizziness and was found to have L posterior canal BPPV, she was treated successfully with L Epley maneuver.   Patient continues to demonstrate the following deficits abnormal tone and decreased coordination and decreased sitting balance, decreased postural control and decreased balance strategies and therefore will continue to benefit from skilled PT intervention to increase functional independence with mobility.  Patient progressing toward long term goals..  Continue plan of care.  PT Short Term Goals Week 1:  PT Short Term Goal 1 (Week 1): Pt will perform bed mobility with min A  PT Short Term Goal 1 - Progress (Week 1): Met PT Short Term Goal 2 (Week 1): Pt will perform least restrictive transfer with min A PT Short Term Goal 2 - Progress (Week 1): Met PT Short Term Goal 3 (Week 1): Pt will initiate power w/c mobility PT Short Term Goal 3 - Progress (Week 1): Met Week 2:  PT Short Term Goal 1 (Week 2): STG=LTG due to ELOS   Skilled Therapeutic Interventions/Progress Updates:    Pt seated in power chair in the  gym upon PT arrival, pt's son present this session for family education. Disucssed vestibular testing findings from last week (including BPPV and negative central vestibular tests), discussed how the patient was treated for dizziness last week (including the Epley maneuver or Canalith Repositioning Technique). Provided hand out discussing at home self treatment for this type of dizziness and reviewed this with the patients son. This therapist recommending that if pt has another onset of dizziness/vertigo at home she should notify her doctor and be re-assessed by a physical therapist for vestibular rehabilitation. This therapist provided information on outpatient physical therapy clinic that provides vestibular rehab. Therapist emphasized that if dizziness/vertigo occurs again she will need to be assessed in order to differentiate and determine onset/cause. Pt and her son both agreeable. Pt performed power w/c mobility this session throughout the unit mod I. Pt set up w/c for car transfer this session, performed slideboard transfer from w/c<>car with assist from son and therapist providing family education. Son verbalizes that he has been assisting as needed prior to hospital admission and is able to help the patient. Pt performed power chair mobility back to room and left in chair with needs in reach and in care of son.   Therapy Documentation Precautions:  Precautions Precautions: Fall Restrictions Weight Bearing Restrictions: No   Therapy/Group: Individual Therapy  Netta Corrigan, PT, DPT, CSRS 12/10/19  1:01 PM    [place] 12/10/2019, 1:00 PM

## 2019-12-10 NOTE — Plan of Care (Signed)
  Problem: Consults Goal: RH GENERAL PATIENT EDUCATION Description: See Patient Education module for education specifics. Outcome: Progressing   Problem: RH BLADDER ELIMINATION Goal: RH STG MANAGE BLADDER WITH ASSISTANCE Description: STG Manage Bladder With Mod I Assistance Outcome: Progressing Goal: RH STG MANAGE BLADDER WITH MEDICATION WITH ASSISTANCE Description: STG Manage Bladder With Medication With Mod I Assistance. Outcome: Progressing   Problem: RH SKIN INTEGRITY Goal: RH STG MAINTAIN SKIN INTEGRITY WITH ASSISTANCE Description: STG Maintain Skin Integrity With Mod I Assistance. Outcome: Progressing   

## 2019-12-10 NOTE — Progress Notes (Signed)
Occupational Therapy Session Note  Patient Details  Name: [name] MRN: [id number] Date of Birth: 12-11-1946  Today's Date: 12/10/2019 OT Individual Time: 1000-1100 OT Individual Time Calculation (min): 60 min    Short Term Goals: Week 1:  OT Short Term Goal 1 (Week 1): Pt will don LB clothing with min A (bed level or EOB) OT Short Term Goal 1 - Progress (Week 1): Met OT Short Term Goal 2 (Week 1): Pt will maintain static sitting balance with no more than 1 LOB during grooming task OT Short Term Goal 2 - Progress (Week 1): Met OT Short Term Goal 3 (Week 1): Pt will complete lateral scoot transfer with LRAD to [facility] with min A OT Short Term Goal 3 - Progress (Week 1): Met Week 2:  OT Short Term Goal 1 (Week 2): STG=LTG due to LOS  Skilled Therapeutic Interventions/Progress Updates:    1:1 Pt in bed when arrived.  Pt reporting needing to go to the bathroom (didn't want to get her slide board wet so decided to perform scoot transfers). Transfer with min A to drop arm BSC. A to remove brief. Pt did have a BM. Pt reports when she has a BM she transfers back to bed to clean herself - due to our setup A pt with clean up lifting herself up with pushing up on arm rests. Perform UB bathing and dressing on BSC with setup.  Pt transferred back to bed with min A scoot pivot. Pt able to perform all LB bathing and dressing with setup. If given more time pt would be able to retreive her own clothing via w/c level and then return back to her bed and then dress herself.. Pt requested to eat lunch in bed. Remained in bed at end of session.   Therapy Documentation Precautions:  Precautions Precautions: Fall Restrictions Weight Bearing Restrictions: No Pain:  no c/o pain in session   Therapy/Group: Individual Therapy  Willeen Cass [facility] 12/10/2019, 1:31 PM

## 2019-12-10 NOTE — Progress Notes (Signed)
[place] PHYSICAL MEDICINE & REHABILITATION PROGRESS NOTE  Subjective/Complaints: Patient seen sitting up in bed this time. She states she slept well overnight.  Denies pain, constipation.   ROS: Denies CP, shortness of breath, nausea, vomiting, diarrhea.  Objective: Vital Signs: Blood pressure 100/64, pulse 67, temperature 98.7 F (37.1 C), temperature source Oral, resp. rate 18, SpO2 96 %. No results found. No results for input(s): WBC, HGB, HCT, PLT in the last 72 hours. No results for input(s): NA, K, CL, CO2, GLUCOSE, BUN, CREATININE, CALCIUM in the last 72 hours.  Physical Exam: BP 100/64 (BP Location: Left Arm)   Pulse 67   Temp 98.7 F (37.1 C) (Oral)   Resp 18   SpO2 96%  Constitutional: No distress . Vital signs reviewed. HENT: Normocephalic.  Atraumatic. Eyes: EOMI. No discharge. Cardiovascular: No JVD. Respiratory: Normal effort.  No stridor. GI: Non-distended. Skin: Warm and dry.  Intact. Psych: Normal mood.  Normal behavior. Musc: No edema in extremities.  No tenderness in extremities. Neurological: Alert  Motor: Bilateral upper extremities: 5 /5 proximal distal, stable right lower extremity: Hip flexion, knee extension 2/5, ankle dorsiflexion 0/5, wiggles toes (baseline, unchanged) Left lower extremity: Hip flexion, knee extension 1/5, ankle dorsiflexion 0/5, wiggles toes (baseline, unchanged)  Assessment/Plan: 1. Functional deficits secondary to multiple sclerosis? exacerbation which require 3+ hours per day of interdisciplinary therapy in a comprehensive inpatient rehab setting.  Physiatrist is providing close team supervision and 24 hour management of active medical problems listed below.  Physiatrist and rehab team continue to assess barriers to discharge/monitor patient progress toward functional and medical goals  Care Tool:  Bathing    Body parts bathed by patient: Right arm, Left arm, Chest, Abdomen, Front perineal area, Buttocks, Right upper  leg, Left upper leg, Right lower leg, Left lower leg, Face   Body parts bathed by helper: Buttocks     Bathing assist Assist Level: Set up assist     Upper Body Dressing/Undressing Upper body dressing   What is the patient wearing?: Pull over shirt    Upper body assist Assist Level: Independent    Lower Body Dressing/Undressing Lower body dressing      What is the patient wearing?: Pants, Incontinence brief     Lower body assist Assist for lower body dressing: Minimal Assistance - Patient > 75%     Toileting Toileting    Toileting assist Assist for toileting: Maximal Assistance - Patient 25 - 49%     Transfers Chair/bed transfer  Transfers assist     Chair/bed transfer assist level: Minimal Assistance - Patient > 75%     Locomotion Ambulation   Ambulation assist   Ambulation activity did not occur: N/A(did not perform prior to admission)          Walk 10 feet activity   Assist  Walk 10 feet activity did not occur: N/A        Walk 50 feet activity   Assist Walk 50 feet with 2 turns activity did not occur: N/A         Walk 150 feet activity   Assist Walk 150 feet activity did not occur: N/A         Walk 10 feet on uneven surface  activity   Assist Walk 10 feet on uneven surfaces activity did not occur: N/A         Wheelchair     Assist Will patient use wheelchair at discharge?: Yes Type of Wheelchair: Power Wheelchair activity did not  occur: Safety/medical concerns  Wheelchair assist level: Independent Max wheelchair distance: 150'    Wheelchair 50 feet with 2 turns activity    Assist    Wheelchair 50 feet with 2 turns activity did not occur: Safety/medical concerns   Assist Level: Independent   Wheelchair 150 feet activity     Assist Wheelchair 150 feet activity did not occur: Safety/medical concerns   Assist Level: Independent      Medical Problem List and Plan: 1.  Decreased functional  mobility with severe vertigo secondary to MS exacerbation.  Patient completed 3-day course of Solu-Medrol.  Patient will need to be established with neurology on outpatient as she has not received follow-up since 2014             Continue CIR 2.  Antithrombotics: -DVT/anticoagulation: Lovenox             -antiplatelet therapy: N/A 3. Pain Management: Tylenol as needed 4. Mood: Provide emotional support             -antipsychotic agents: N/A 5. Neuropsych: This patient is capable of making decisions on her own behalf. 6. Skin/Wound Care: Routine skin checks 7. Fluids/Electrolytes/Nutrition: Routine in and outs.   8.  Vertigo.  Antivert PRN, monitor for efficacy  Improving 9.  Neurogenic bowel/ bladder.  Continue Mandelamine for now.  Adjust bowel program.    PVRs ordered, only performed x1, with residual of 150, continue to monitor 10.  Hypophosphatemia  Phosphorus 3.8 on 12/1, labs ordered for Monday 12.  Acute blood loss anemia: Resolved  Hemoglobin 12.9 on 12/23 13.  Labile/soft blood pressure  Monitor with activity, asymptomatic at present  Pt willing to take BP at home and take midodrine if BP <90/60 or lightheaded.    Discussed importance of oral hydration with water  Controlled on 12/25, 12/26 14. Hyperkalemia: Resolved  Potassium 4.1 on 12/23 15. Mild leukocytosis: Resolved  WBC 9.0 on 12/23  LOS: 8 days A FACE TO FACE EVALUATION WAS PERFORMED  Gayathri Futrell P Larie Mathes 12/10/2019, 1:35 PM

## 2019-12-10 NOTE — Progress Notes (Signed)
Occupational Therapy Weekly Progress Note  Patient Details  Name: [name] MRN: [id number] Date of Birth: 01-04-1946  Beginning of progress report period: December 03, 2019 End of progress report period: December 10, 2019  Today's Date: 12/10/2019 OT Individual Time: 1300-1400 OT Individual Time Calculation (min): 60 min    Patient has met 3 of 3 short term goals.  Pt is making steady progress towards OT goals. She is completing LB bathing/dressing from bed level with supervision/set-up. UB bathing/dressing and grooming tasks from w/c level  Mod I.  She completes functional transfers via slide board or scoot with supervision-mod A depending on set-up and pt engagement. She cont to display very poor mental flexibility in ability to carry over skills learned in tx session to ADL tasks at home. She also displays poor insight into deficits and functional problem solving, often responding to questions, "I don't know how I do it, but I just do it at home" or "it's different at home, I can do it there".  Pt reports dizziness with mobility has subsided almost entirely.   Patient continues to demonstrate the following deficits: muscle weakness, decreased awareness and decreased sitting balance and decreased postural control.and therefore will continue to benefit from skilled OT intervention to enhance overall performance with BADL and Reduce care partner burden.  Patient progressing toward long term goals..  Continue plan of care.  OT Short Term Goals Week 1:  OT Short Term Goal 1 (Week 1): Pt will don LB clothing with min A (bed level or EOB) OT Short Term Goal 1 - Progress (Week 1): Met OT Short Term Goal 2 (Week 1): Pt will maintain static sitting balance with no more than 1 LOB during grooming task OT Short Term Goal 2 - Progress (Week 1): Met OT Short Term Goal 3 (Week 1): Pt will complete lateral scoot transfer with LRAD to [facility] with min A OT Short Term Goal 3 - Progress (Week 1):  Met Week 2:  OT Short Term Goal 1 (Week 2): STG=LTG due to LOS  Skilled Therapeutic Interventions/Progress Updates:    Pt seen for OT session focusing on functional transfers, ADL re-training and caregiver education with pt's son. Pt sitting up in supine upon arrival, agreeable to tx session and denying pain, requesting toileting task.  Transfers/mobility: Supine>sitting EOB with supervision using hospital bed functions. Mod A scoot pivot transfer to heavy duty BSC.  Min A sliding board transfer EOB>power w/c. Management of power w/c throughout unit with supervision. x4 w/c<>EOM, first two trials completed via scoot with min-mod A, strong posterior bias increasing fall risk. Second two trials with use of sliding board, overall close supervision. Pt able to place board with increased time, will benefit from trial with short sliding board.   ADL Re-Training:Total A for clothing management while pt completed push-up, hygiene completed with set-up.   Patient/Caregiver education: Education provided to pt and son regarding CLOF, DME, activity progression, continuum of care, benefits of sliding board vs scoot transfer, energy conservation and d/c planning. Pt's son voiced understanding and provided encouragement to pt throughout session.  Pt and son left seated in gym at end of session awaiting hand off to PT.   Therapy Documentation Precautions:  Precautions Precautions: Fall Restrictions Weight Bearing Restrictions: No   Therapy/Group: Individual Therapy  Torrin Frein L 12/10/2019, 6:53 AM

## 2019-12-10 NOTE — Progress Notes (Signed)
Physical Therapy Session Note  Patient Details  Name: [name] MRN: [id number] Date of Birth: 1946-03-15  Today's Date: 12/10/2019 PT Individual Time: 7867-6720 PT Individual Time Calculation (min): 34 min   Short Term Goals: Week 1:  PT Short Term Goal 1 (Week 1): Pt will perform bed mobility with min A  PT Short Term Goal 2 (Week 1): Pt will perform least restrictive transfer with min A PT Short Term Goal 3 (Week 1): Pt will initiate power w/c mobility  Skilled Therapeutic Interventions/Progress Updates:    Pt received, asleep supine in bed and easily awakens and agreeable to therapy session. Supine>sitting R EOB with min assist for B LE management wearing shoes and trunk control while on air mattress. Performed seated trunk control activities while holding unweighted ball and performing trunk rotations, pushing ball forward/backwards then up/down all with close supervision for safety. Progressed to performing seated shoulder presses in/out and up/down using 1lb dowel rod with close supervision for safety x10 reps each. Performed seated B UE strengthening activities using 2lb dumbbell of bicep curls, overhead presses, and rows approximately 10 reps each for both UEs - cuing for improved form throughout. Performed seated postural task with back support on therapist while pt performed scapular retractions and depression x10 reps progressed to maintaining improved upright posture while performing x10 reps cervical retractions with max manual facilitation and tactile cuing for proper form/technique. Discussed D/C plan of using transfer board for bed<>w/c transfers at home and pt expressed having difficulty removing board after a transfer - discussed plan for getting a smaller transfer board for increased pt ease of use and strategies for placing board under her hips more prior to performing transfer to allow for better positioning at end of transfer. Sit>supine with supervision and pt performing  B LE management without assistance. Doffed shoes max assist for time management. Pt left supine in bed with needs in reach.  Therapy Documentation Precautions:  Precautions Precautions: Fall Restrictions Weight Bearing Restrictions: No  Pain:   Denies pain during session.   Therapy/Group: Individual Therapy  Tawana Scale, PT, DPT 12/10/2019, 3:30 PM

## 2019-12-11 NOTE — Plan of Care (Signed)
  Problem: RH Simple Meal Prep Goal: LTG Patient will perform simple meal prep w/assist (OT) Description: LTG: Patient will perform simple meal prep with assistance, with/without cues (OT). Outcome: Not Applicable Flowsheets (Taken 12/11/2019 0646) LTG: Pt will perform simple meal prep with assistance level of: (Goal d/c 12/27-AR) -- Note: Goal d/c as not a patient priority at this time. Jeevan Kalla, OTR/L

## 2019-12-11 NOTE — Care Management (Signed)
[facility] Individual Statement of Services  Patient Name:  [name]  Date:  12/05/2019  Welcome to the [facility].  Our goal is to provide you with an individualized program based on your diagnosis and situation, designed to meet your specific needs.  With this comprehensive rehabilitation program, you will be expected to participate in at least 3 hours of rehabilitation therapies Monday-Friday, with modified therapy programming on the weekends.  Your rehabilitation program will include the following services:  Physical Therapy (PT), Occupational Therapy (OT), 24 hour per day rehabilitation nursing, Case Management (Social Worker), Rehabilitation Medicine, Nutrition Services and Pharmacy Services  Weekly team conferences will be held on Tuesdays to discuss your progress.  Your Social Worker will talk with you frequently to get your input and to update you on team discussions.  Team conferences with you and your family in attendance may also be held.  Expected length of stay: 14-16 days   Overall anticipated outcome: modified independent @ w/c  Depending on your progress and recovery, your program may change. Your Social Worker will coordinate services and will keep you informed of any changes. Your Social Worker's name and contact numbers are listed  below.  The following services may also be recommended but are not provided by the [facility] will be made to provide these services after discharge if needed.  Arrangements include referral to agencies that provide these services.  Your insurance has been verified to be:  [employer] Your primary doctor is:  Silvio Pate  Pertinent information will be shared with your doctor and your insurance company.  Social Worker:  [place], [place] or [phone]   Information discussed with and copy given to patient by: Lennart Pall, 12/05/2019, 12:39 PM

## 2019-12-11 NOTE — Progress Notes (Signed)
Social Work Assessment and Plan   Patient Details  Name: [name] MRN: [id number] Date of Birth: 05-19-1946  Today's Date: 12/05/2019  Problem List:  Patient Active Problem List   Diagnosis Date Noted  . Labile blood pressure   . Arterial hypotension   . Vertigo   . Hypophosphatemia   . Acute blood loss anemia   . Vertigo as late effect of stroke   . Neurogenic bowel   . Neurogenic bladder   . Multiple sclerosis exacerbation (HCC) 11/27/2019  . Pressure injury of skin 11/27/2019  . Acute pain of left wrist 10/06/2019  . Advance directive discussed with patient 05/17/2018  . Paraplegia, unspecified (HCC) 07/02/2017  . Recurrent UTI 07/02/2017  . Cervical spinal stenosis 06/12/2016  . 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 Health care maintenance 10/18/2012  . Osteoporosis   . Nephrolithiasis   . Dyslipidemia 05/25/2008  . Multiple sclerosis (HCC) 05/24/2008  . MITRAL VALVE PROLAPSE, HX OF 05/24/2008   Past Medical History:  Past Medical History:  Diagnosis Date  . Ankle fracture, left 07/2006  . Ankle fracture, right 10/30/06   [facility]  . Compression fracture 2011   Dr. Dion Saucier   . Disc herniation    L5, S1  . MS (multiple sclerosis) (HCC)    evaluated by Dr. Sandria Manly  . MVP (mitral valve prolapse) 1979   ECHO. Trace MR, trace TR, trace PR 07/17/03  . Nephrolithiasis   . Osteoporosis   . Rib fractures 1998, [id number]   2  . Toe fracture 1998   5   Past Surgical History:  Past Surgical History:  Procedure Laterality Date  . LUMBAR DISC SURGERY  04/2007   L5, S1- [facility]  . SEPTOPLASTY  1983   deviated septum  . TONSILLECTOMY AND ADENOIDECTOMY  1960  . TOTAL ABDOMINAL HYSTERECTOMY W/ BILATERAL SALPINGOOPHORECTOMY  1982   for endometriosis   Social History:  reports that she has never smoked. She has never used smokeless tobacco. She reports that she does not drink alcohol or use drugs.  Family / Support Systems Marital Status: Widow/Widower How Long?:  1991 Patient Roles: Parent Children: son, [name] (local) @ [phone];  daughter living in local ALF Anticipated Caregiver: son intermittent only Ability/Limitations of Caregiver: son prn only; patient has lived alone in this set up for 10 years Caregiver Availability: Intermittent Family Dynamics: Pt notes that son is supportive, however, cannot provide 24/7 assist  Social History Preferred language: English Religion: Non-Denominational Cultural Background: NA Read: Yes Write: Yes Employment Status: Retired Marine scientist Issues: None Guardian/Conservator: None - per MD, pt is capable of making decisions on her own behalf.   Abuse/Neglect Abuse/Neglect Assessment Can Be Completed: Yes Physical Abuse: Denies Verbal Abuse: Denies Sexual Abuse: Denies Exploitation of patient/patient's resources: Denies Self-Neglect: Denies  Emotional Status Pt's affect, behavior and adjustment status: Pt sitting up in bed and completes assessment interview without any difficulty.  Patient very prideful of her ability to remain independent in her home for several years.  Patient very motivated for CIR and to return to prior level function. Recent Psychosocial Issues: None Psychiatric History: None reported Substance Abuse History: None reported  Patient / Family Perceptions, Expectations & Goals Pt/Family understanding of illness & functional limitations: Patient and son with good, general understanding of her issues now with vertigo.  Receiving education on vestibular therapy. Premorbid pt/family roles/activities: Patient was mostly independent from the wheelchair level PTA. Anticipated changes in roles/activities/participation: Little change anticipated as son has  been providing intermittent assistance to patient as he is able and will resume this. Pt/family expectations/goals: Per patient, "if this vertigo will go away I will be just fine."  [employer]: None Premorbid Home Care/DME Agencies: None Transportation available at discharge: Yes  Discharge Planning Living Arrangements: Alone Support Systems: Children Type of Residence: Private residence Insurance Resources: Chartered certified accountant Resources: [place] Referred: No Living Expenses: Own Money Management: Patient Does the patient have any problems obtaining your medications?: No Home Management: Patient Patient/Family Preliminary Plans: Patient fully intends to return to her home with intermittent support from her son. Social Work Anticipated Follow Up Needs: HH/OP Expected length of stay: 14 to 16 days  Clinical Impression Pleasant, talkative woman admitted to the hospital with severe vertigo and history of MS.  Living independently at a wheelchair level at home with intermittent assistance from her son.  Patient hopeful that therapy can address her vertigo and feels that once this is treated she will be back to baseline function.  Patient denies any significant emotional distress.  Will follow for support and discharge planning needs.  [name] 12/05/2019, 12:37 PM

## 2019-12-11 NOTE — Plan of Care (Signed)
  Problem: Consults Goal: RH GENERAL PATIENT EDUCATION Description: See Patient Education module for education specifics. Outcome: Progressing   Problem: RH SKIN INTEGRITY Goal: RH STG MAINTAIN SKIN INTEGRITY WITH ASSISTANCE Description: STG Maintain Skin Integrity With Mod I Assistance. Outcome: Progressing   Problem: RH BLADDER ELIMINATION Goal: RH STG MANAGE BLADDER WITH ASSISTANCE Description: STG Manage Bladder With Mod I Assistance Outcome: Not Progressing Goal: RH STG MANAGE BLADDER WITH MEDICATION WITH ASSISTANCE Description: STG Manage Bladder With Medication With Mod I Assistance. Outcome: Not Progressing

## 2019-12-11 NOTE — Progress Notes (Signed)
[place] PHYSICAL MEDICINE & REHABILITATION PROGRESS NOTE  Subjective/Complaints: Patient seen sitting up in bed this time. She states she slept well overnight.  Denies pain, constipation.  Had a good BM this morning.   ROS: Denies CP, shortness of breath, nausea, vomiting, diarrhea.  Objective: Vital Signs: Blood pressure 107/65, pulse 66, temperature 98.3 F (36.8 C), temperature source Oral, resp. rate 16, SpO2 97 %. No results found. No results for input(s): WBC, HGB, HCT, PLT in the last 72 hours. No results for input(s): NA, K, CL, CO2, GLUCOSE, BUN, CREATININE, CALCIUM in the last 72 hours.  Physical Exam: BP 107/65 (BP Location: Left Arm)   Pulse 66   Temp 98.3 F (36.8 C) (Oral)   Resp 16   SpO2 97%  Constitutional: No distress . Vital signs reviewed. HENT: Normocephalic.  Atraumatic. Eyes: EOMI. No discharge. Cardiovascular: No JVD. Respiratory: Normal effort.  No stridor. GI: Non-distended. Skin: Warm and dry.  Intact. Psych: Normal mood.  Normal behavior. Musc: No edema in extremities.  No tenderness in extremities. Neurological: Alert  Motor: Bilateral upper extremities: 5 /5 proximal distal, stable right lower extremity: Hip flexion, knee extension 2/5, ankle dorsiflexion 0/5, wiggles toes (baseline, unchanged) Left lower extremity: Hip flexion, knee extension 1/5, ankle dorsiflexion 0/5, wiggles toes (baseline, unchanged)  Assessment/Plan: 1. Functional deficits secondary to multiple sclerosis? exacerbation which require 3+ hours per day of interdisciplinary therapy in a comprehensive inpatient rehab setting.  Physiatrist is providing close team supervision and 24 hour management of active medical problems listed below.  Physiatrist and rehab team continue to assess barriers to discharge/monitor patient progress toward functional and medical goals  Care Tool:  Bathing    Body parts bathed by patient: Right arm, Left arm, Chest, Abdomen, Front perineal  area, Buttocks, Right upper leg, Left upper leg, Right lower leg, Left lower leg, Face   Body parts bathed by helper: Buttocks     Bathing assist Assist Level: Set up assist     Upper Body Dressing/Undressing Upper body dressing   What is the patient wearing?: Pull over shirt    Upper body assist Assist Level: Independent    Lower Body Dressing/Undressing Lower body dressing      What is the patient wearing?: Pants, Incontinence brief     Lower body assist Assist for lower body dressing: Minimal Assistance - Patient > 75%     Toileting Toileting    Toileting assist Assist for toileting: Maximal Assistance - Patient 25 - 49%     Transfers Chair/bed transfer  Transfers assist     Chair/bed transfer assist level: Contact Guard/Touching assist     Locomotion Ambulation   Ambulation assist   Ambulation activity did not occur: N/A(did not perform prior to admission)          Walk 10 feet activity   Assist  Walk 10 feet activity did not occur: N/A        Walk 50 feet activity   Assist Walk 50 feet with 2 turns activity did not occur: N/A         Walk 150 feet activity   Assist Walk 150 feet activity did not occur: N/A         Walk 10 feet on uneven surface  activity   Assist Walk 10 feet on uneven surfaces activity did not occur: N/A         Wheelchair     Assist Will patient use wheelchair at discharge?: Yes Type of Wheelchair: Power  Wheelchair activity did not occur: Safety/medical concerns  Wheelchair assist level: Independent Max wheelchair distance: 150'    Wheelchair 50 feet with 2 turns activity    Assist    Wheelchair 50 feet with 2 turns activity did not occur: Safety/medical concerns   Assist Level: Independent   Wheelchair 150 feet activity     Assist Wheelchair 150 feet activity did not occur: Safety/medical concerns   Assist Level: Independent      Medical Problem List and Plan: 1.   Decreased functional mobility with severe vertigo secondary to MS exacerbation.  Patient completed 3-day course of Solu-Medrol.  Patient will need to be established with neurology on outpatient as she has not received follow-up since 2014             Continue CIR 2.  Antithrombotics: -DVT/anticoagulation: Lovenox             -antiplatelet therapy: N/A 3. Pain Management: Tylenol as needed 4. Mood: Provide emotional support             -antipsychotic agents: N/A 5. Neuropsych: This patient is capable of making decisions on her own behalf. 6. Skin/Wound Care: Routine skin checks 7. Fluids/Electrolytes/Nutrition: Routine in and outs.   8.  Vertigo.  Antivert PRN, monitor for efficacy  Improving 9.  Neurogenic bowel/ bladder.  Continue Mandelamine for now.  Adjust bowel program.    PVRs ordered, only performed x1, with residual of 150, continue to monitor 10.  Hypophosphatemia  Phosphorus 3.8 on 12/1, labs ordered for Monday 12.  Acute blood loss anemia: Resolved  Hemoglobin 12.9 on 12/23 13.  Labile/soft blood pressure  Monitor with activity, asymptomatic at present  Pt willing to take BP at home and take midodrine if BP <90/60 or lightheaded.  Discussed importance of oral hydration with water  Controlled on 12/25, 12/26, 12/27 14. Hyperkalemia: Resolved  Potassium 4.1 on 12/23 15. Mild leukocytosis: Resolved  WBC 9.0 on 12/23  LOS: 9 days A FACE TO FACE EVALUATION WAS PERFORMED  Jaleel Allen P [facility] 12/11/2019, 1:04 PM

## 2019-12-12 ENCOUNTER — Inpatient Hospital Stay (HOSPITAL_COMMUNITY): Payer: Medicare HMO | Admitting: Occupational Therapy

## 2019-12-12 ENCOUNTER — Inpatient Hospital Stay (HOSPITAL_COMMUNITY): Payer: Medicare HMO | Admitting: Physical Therapy

## 2019-12-12 DIAGNOSIS — R030 Elevated blood-pressure reading, without diagnosis of hypertension: Secondary | ICD-10-CM

## 2019-12-12 LAB — PHOSPHORUS: Phosphorus: 3.7 mg/dL (ref 2.5–4.6)

## 2019-12-12 NOTE — Progress Notes (Signed)
[place] PHYSICAL MEDICINE & REHABILITATION PROGRESS NOTE  Subjective/Complaints: Patient seen sitting up in bed this morning.  She states she slept well overnight.  She states she had a good weekend.  She has questions regarding home BP monitoring.  ROS: Denies CP, shortness of breath, nausea, vomiting, diarrhea.  Objective: Vital Signs: Blood pressure 98/61, pulse 62, temperature 98 F (36.7 C), temperature source Oral, resp. rate 16, SpO2 96 %. No results found. No results for input(s): WBC, HGB, HCT, PLT in the last 72 hours. No results for input(s): NA, K, CL, CO2, GLUCOSE, BUN, CREATININE, CALCIUM in the last 72 hours.  Physical Exam: BP 98/61 (BP Location: Left Arm)   Pulse 62   Temp 98 F (36.7 C) (Oral)   Resp 16   SpO2 96%  Constitutional: No distress . Vital signs reviewed. HENT: Normocephalic.  Atraumatic. Eyes: EOMI. No discharge. Cardiovascular: No JVD. Respiratory: Normal effort.  No stridor. GI: Non-distended. Skin: Warm and dry.  Intact. Psych: Normal mood.  Normal behavior. Musc: No edema in extremities.  No tenderness in extremities. Neurological: Alert  Motor: Bilateral upper extremities: 5 /5 proximal distal, unchanged  Right lower extremity: Hip flexion, knee extension 2/5, ankle dorsiflexion 0/5, wiggles toes (baseline, stable) Left lower extremity: Hip flexion, knee extension 1 +/5, ankle dorsiflexion 0/5, wiggles toes (baseline, stable)  Assessment/Plan: 1. Functional deficits secondary to multiple sclerosis? exacerbation which require 3+ hours per day of interdisciplinary therapy in a comprehensive inpatient rehab setting.  Physiatrist is providing close team supervision and 24 hour management of active medical problems listed below.  Physiatrist and rehab team continue to assess barriers to discharge/monitor patient progress toward functional and medical goals  Care Tool:  Bathing    Body parts bathed by patient: Right arm, Left arm, Chest,  Abdomen, Front perineal area, Buttocks, Right upper leg, Left upper leg, Right lower leg, Left lower leg, Face   Body parts bathed by helper: Buttocks     Bathing assist Assist Level: Set up assist     Upper Body Dressing/Undressing Upper body dressing   What is the patient wearing?: Pull over shirt    Upper body assist Assist Level: Independent    Lower Body Dressing/Undressing Lower body dressing      What is the patient wearing?: Pants, Incontinence brief     Lower body assist Assist for lower body dressing: Minimal Assistance - Patient > 75%     Toileting Toileting    Toileting assist Assist for toileting: Maximal Assistance - Patient 25 - 49%     Transfers Chair/bed transfer  Transfers assist     Chair/bed transfer assist level: Contact Guard/Touching assist     Locomotion Ambulation   Ambulation assist   Ambulation activity did not occur: N/A(did not perform prior to admission)          Walk 10 feet activity   Assist  Walk 10 feet activity did not occur: N/A        Walk 50 feet activity   Assist Walk 50 feet with 2 turns activity did not occur: N/A         Walk 150 feet activity   Assist Walk 150 feet activity did not occur: N/A         Walk 10 feet on uneven surface  activity   Assist Walk 10 feet on uneven surfaces activity did not occur: N/A         Wheelchair     Assist Will patient use wheelchair  at discharge?: Yes Type of Wheelchair: Power Wheelchair activity did not occur: Safety/medical concerns  Wheelchair assist level: Independent Max wheelchair distance: 150'    Wheelchair 50 feet with 2 turns activity    Assist    Wheelchair 50 feet with 2 turns activity did not occur: Safety/medical concerns   Assist Level: Independent   Wheelchair 150 feet activity     Assist Wheelchair 150 feet activity did not occur: Safety/medical concerns   Assist Level: Independent      Medical Problem  List and Plan: 1.  Decreased functional mobility with severe vertigo secondary to MS exacerbation.  Patient completed 3-day course of Solu-Medrol.  Patient will need to be established with neurology on outpatient as she has not received follow-up since 2014             Continue CIR 2.  Antithrombotics: -DVT/anticoagulation: Lovenox             -antiplatelet therapy: N/A 3. Pain Management: Tylenol as needed 4. Mood: Provide emotional support             -antipsychotic agents: N/A 5. Neuropsych: This patient is capable of making decisions on her own behalf. 6. Skin/Wound Care: Routine skin checks 7. Fluids/Electrolytes/Nutrition: Routine in and outs.   8.  Vertigo.  Antivert PRN, monitor for efficacy  Improved 9.  Neurogenic bowel/ bladder.  Continue Mandelamine for now.  Adjust bowel program.    PVRs ordered, only performed x1 on 12/24, with residual of 150, continue to monitor 10.  Hypophosphatemia  Phosphorus 3.7 on 12/28 12.  Acute blood loss anemia: Resolved  Hemoglobin 12.9 on 12/23 13.  Labile/soft blood pressure  Monitor with activity, asymptomatic at present  Pt willing to take BP at home and take midodrine if BP <90/60 or lightheaded.    Discussed importance of oral hydration with water  Relatively soft, but asymptomatic on 12/20 14. Hyperkalemia: Resolved  Potassium 4.1 on 12/23 15. Mild leukocytosis: Resolved  WBC 9.0 on 12/23  LOS: 10 days A FACE TO FACE EVALUATION WAS PERFORMED  Reese Stockman Karis Juba 12/12/2019, 9:09 AM

## 2019-12-12 NOTE — Progress Notes (Signed)
Physical Therapy Session Note  Patient Details  Name: MEGGEN SPAZIANI MRN: [id number] Date of Birth: 30-Jul-1946  Today's Date: 12/12/2019 PT Individual Time: 1115-1155 PT Individual Time Calculation (min): 40 min   Short Term Goals: Week 2:  PT Short Term Goal 1 (Week 2): STG=LTG due to ELOS  Skilled Therapeutic Interventions/Progress Updates:   Pt in power chair, agreeable to therapy and no c/o pain. Power chair mobility to/from therapy gym w/o assist. Worked on BUE strengthening and endurance in seated including trunk rotations w/ 3kg med ball 2x20, shoulder press anteriorly and superiorly w/ 3# dowel 2x10 reps each, and hitting beach ball w/ 4# dowel (emphasis on slow and controlled movements). Performed passive B hamstring and gastroc stretching during rest breaks, 30 sec hold x5 reps each side. Also added in towel roll support under feet to allow for increased support and neutral positioning while seated in power chair. Returned to room and ended session tilted in power chair, all needs in reach.   Therapy Documentation Precautions:  Precautions Precautions: Fall Restrictions Weight Bearing Restrictions: No   Therapy/Group: Individual Therapy  Deronte Solis Clent Demark 12/12/2019, 12:11 PM

## 2019-12-12 NOTE — Progress Notes (Signed)
Occupational Therapy Session Note  Patient Details  Name: [name] MRN: [id number] Date of Birth: 13-May-1946  Today's Date: 12/12/2019 OT Individual Time: [phone] and OT Individual Time Calculation (min): 55 min and   Short Term Goals: Week 1:  OT Short Term Goal 1 (Week 1): Pt will don LB clothing with min A (bed level or EOB) OT Short Term Goal 1 - Progress (Week 1): Met OT Short Term Goal 2 (Week 1): Pt will maintain static sitting balance with no more than 1 LOB during grooming task OT Short Term Goal 2 - Progress (Week 1): Met OT Short Term Goal 3 (Week 1): Pt will complete lateral scoot transfer with LRAD to [facility] with min A OT Short Term Goal 3 - Progress (Week 1): Met Week 2:  OT Short Term Goal 1 (Week 2): STG=LTG due to LOS  Skilled Therapeutic Interventions/Progress Updates:    Session 1: Upon entering the room, pt supine in bed and NT present assisting pt with changing brief from bed level. Pt able to roll self L <> R with supervision and able to pull pants over B hips with encouragement and multiple rolls. OT positioned power wheelchair and pt performed circle sitting to don B shoes with set up A. Supine >sit with min A to EOB for trunk support while pt managing B LEs. OT placing slide board and pt needing min A to start transfer but then only needing min guard into wheelchair. Pt pushing up on arm rest for therapist to move board and for positioning. Pt maneuvered chair in tight space and pulled up to sink for UB self care and grooming. Pt needing min A to fasten bra as she reports normally doing this from bed level. Pt continues to report, " These things are just easier at home." Pt positions wheelchair next to bed with call bell and all needed items within reach. Chair tilted slightly for postioning and seat belt donned.    Session 2: Upon entering the room, pt seated in wheelchair and reports having set up since last session. She verbalized need to urinate and OT  encouraged pt to transfer onto [facility]. Pt becoming upset and not wanting to transfer [facility] because she "never transfers from wheelchair to toilet". Pt then insisting the commode be placed in a certain position and pt moving power wheelchair to change position when therapist attempting to educate on where [facility] should go in relationship to position of power wheelchair for safety and ease of transfer. Pt continued to move equipment position and therefore pt needing max A for pivot onto drop arm commode chair. Pt pushing up with B UEs for therapist to assist with clothing management. Pt able to void and performs hygiene with set up A. Pt then unable to lean or push up for therapist to assist with LB clothing management secondary to fatigue. Therefore, OT assisting pt with squat while second helper assists with pulling pants over buttocks. Max A squat pivot transfer into power wheelchair. Pt verbalizes she does all laundry tasks at home and she maneuvers power wheelchair into ADL apartment and demonstrated how she would gather dry clothing from dryer, fold, and utilized wheelchair to place into dresser drawers with increased time. Pt returning to room, positions wheelchair and performs min scoot transfer back into bed. Min guard for sitting balance while pt manages to get B LEs into bed and repositioned. Pt doffs clothing items and remained in bed with call bell and all needed items within reach  upon exiting the room.   Therapy Documentation Precautions:  Precautions Precautions: Fall Restrictions Weight Bearing Restrictions: No General:   Vital Signs:   Pain:   ADL: ADL Eating: Modified independent Where Assessed-Eating: Chair Grooming: Supervision/safety Where Assessed-Grooming: Chair Upper Body Bathing: Minimal assistance Where Assessed-Upper Body Bathing: Edge of bed Lower Body Bathing: Minimal assistance Where Assessed-Lower Body Bathing: Bed level Upper Body Dressing: Minimal assistance Where  Assessed-Upper Body Dressing: Edge of bed Lower Body Dressing: Moderate assistance Where Assessed-Lower Body Dressing: Bed level Toileting: Unable to assess Toilet Transfer: Moderate assistance Toilet Transfer Method: Theatre manager: Radiographer, therapeutic: Not assessed Vision   Perception    Praxis   Exercises:   Other Treatments:     Therapy/Group: Individual Therapy  Gypsy Decant 12/12/2019, 9:55 AM

## 2019-12-13 ENCOUNTER — Inpatient Hospital Stay (HOSPITAL_COMMUNITY): Payer: Medicare HMO | Admitting: Occupational Therapy

## 2019-12-13 ENCOUNTER — Inpatient Hospital Stay (HOSPITAL_COMMUNITY): Payer: Medicare HMO | Admitting: Physical Therapy

## 2019-12-13 NOTE — Progress Notes (Signed)
Occupational Therapy Session Note  Patient Details  Name: [name] MRN: [id number] Date of Birth: 08-25-1946  Today's Date: 12/13/2019 OT Individual Time: 5852-7782 and 1100-1130 and 1415-1500 OT Individual Time Calculation (min): 60 min and 30 min and 45 min   Short Term Goals: Week 2:  OT Short Term Goal 1 (Week 2): STG=LTG due to LOS  Skilled Therapeutic Interventions/Progress Updates:    Session One: Pt seen for OT ADL bathing/dressing session. Pt in supine upon arrival, awake and agreeable to tx session and denying pain. Agreeable to bathing/dressing. ADL Re-Training: LB bathing/dressing completed at bed. Pt refusing to lower HOB entirely in simulation of home set-up, completed with HOB elevated and use of bed rails. Set-up/supervision LB bathing/dressing. Toileting completed withTotal A for clothing management on BSC and pt completing hygiene with set-up.  UB bathing/dressing and grooming tasks completed from w/c level at sink with set-up.  Transfers: Rolling R/Collins with supervision using bed rails. Supine>sitting EOB with supervision using hospital bed functions. Scoot transfer EOB<> padded drop arm BSC with close supervision and assist to stabilize equipment. EOB>power w/c with sliding board and close supervision with VCs for technique, anterior weight shift and awareness to positioning of B LEs during transfer. Pt left seated in power w/c at end of session compelting grooming tasks at sink. NT made aware of pt's position and to check in on pt. Pt cont to demonstrate poor mental flexibility with ability to carryover education/techniques from therapy to home use/set-up. Pt insistent on "things are just different at home, I know I can do it there", however, unable to elaborate on how such things are different and how it will allow her to be successful.   Session Two: Pt seen for OT session focusing on functional transfers and ADL re-training. Pt sitting up in power w/c upon arrival,  agreeable to tx session and denying pain. Requests for toileting task. Sliding board transfer power w/c>padded drop arm BSC. Guarding assist with assist to stabilize equipment and VCs for hand placement and weightshift. Total A clothing management while pt completed push up using armrests. Continent urination and pt completed hygiene with set-up. Min A sliding board transfer to uphill power w/c. Small step placed under feet to assist with balance.  Pt left seated in power w/c at end of session, all needs in reach. Reviewed pressure relief schedule.  Discussed ROHO cushion for d/c use, pt aware insurance does not cover this as pt with no skin breakdown.   Session Three: Pt seen for OT session focusing on education and core strengthening. Pt sitting up in power w/c upon arrival, agreeable to tx session. Called pt's son to have him measure home power w/c in order to assess needed cushion size. Pt educated regarding benefits of ROHO cushion vs. Standard chushion pt is currently on.  Extensive education/discussion with pt and CSW regarding continuum of care, follow-up therapy and medical needs. Pt hesitant for people to come into house or to go out into community 2/2 COVID fears. Extensive education provided regarding benefits and services of follow-up therapy and medical needs, will cont to assess. She managed power w/c to therapy gym. Completed scoot transfer to therapy mat with close supervision. Completed core strengthening activity, leaning back onto wedge and coming into upright sitting to toss ball to therapist, completed x15 with periodic rest breaks. Pt returned to room at end of session, sliding board transfer to bed with supervision and assist to place and remove sliding board.  Pt left in supine  with all needs in reach.     Therapy Documentation Precautions:  Precautions Precautions: Fall Restrictions Weight Bearing Restrictions: No Pain:     Therapy/Group: Individual Therapy  [name],  [name] 12/13/2019, 7:00 AM

## 2019-12-13 NOTE — Patient Care Conference (Signed)
Inpatient RehabilitationTeam Conference and Plan of Care Update Date: 12/14/2019   Time: 12:05 PM    Patient Name: [name]      Medical Record Number: [id number]  Date of Birth: [birth year] Sex: Female         Room/Bed: 4M12C/4M12C-01 Payor Info: Payor: Monia Pouch MEDICARE / Plan: [employer]ia [employer] MEDICARE HMO/PPO / Product Type: *No Product type* /    Admit Date/Time:  12/02/2019  4:44 PM  Primary Diagnosis:  Multiple sclerosis (HCC)  Patient Active Problem List   Diagnosis Date Noted  . Borderline blood pressure   . Labile blood pressure   . Arterial hypotension   . Vertigo   . Hypophosphatemia   . Acute blood loss anemia   . Vertigo as late effect of stroke   . Neurogenic bowel   . Neurogenic bladder   . Multiple sclerosis exacerbation (HCC) 11/27/2019  . Pressure injury of skin 11/27/2019  . Acute pain of left wrist 10/06/2019  . Advance directive discussed with patient 05/17/2018  . Paraplegia, unspecified (HCC) 07/02/2017  . Recurrent UTI 07/02/2017  . Cervical spinal stenosis 06/12/2016  . 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 Health care maintenance 10/18/2012  . Osteoporosis   . Nephrolithiasis   . Dyslipidemia 05/25/2008  . Multiple sclerosis (HCC) 05/24/2008  . MITRAL VALVE PROLAPSE, HX OF 05/24/2008    Expected Discharge Date: Expected Discharge Date: 12/16/19  Team Members Present: Physician leading conference: Dr. Genice Rouge Social Worker Present: Amada Jupiter, LCSW Nurse Present: Other (comment)(Susan Nedra Hai, RN) Case Manager: Roderic Palau, RN PT Present: Karolee Stamps, PT OT Present: Amy Rounds, OT SLP Present: Feliberto Gottron, SLP PPS Coordinator present : Fae Pippin, [place], PT     Current Status/Progress Goal Weekly Team Focus  Bowel/Bladder   Incontinent, frequent urination  Improved continence  Timed toileting, assist with toileting needs   Swallow/Nutrition/ Hydration             ADL's   min guard - min A slide board transfers,  set up A UB/LB self care bed level, toileting  on BSC with mod- max A, toileting bed level with set up A - min A  Mod I overall  ADL retraining, functional transfers, strength, activity tolerance, and balance   Mobility   supervision-min asisst bed mobility, CGA-min assist slide board transfer, mod/i power w/c mobility  mod/i w/c level  discharge planning, BPPV education, global strengthening, transfers and bed mobility   Communication             Safety/Cognition/ Behavioral Observations            Pain   No C/O pain  or discomfort  <3 on pain scale of 0 to 10  Assess pain q4hrs and address appropriately   Skin   Skin is warm, clean and intact. Foam drsg to sacrum for pressure ulcer prevention  Maintain Skin intergrity  Assess skin q shift and document appropriately    Rehab Goals Patient on target to meet rehab goals: Yes *See Care Plan and progress notes for long and short-term goals.     Barriers to Discharge  Current Status/Progress Possible Resolutions Date Resolved   Nursing                  PT                    OT  SLP                SW                Discharge Planning/Teaching Needs:  Pt to d/c home with intermittent assist from son (if she allows this)  Teaching completed with son this past weekend.   Team Discussion: Vertigo improved.  RN - urine frequency, incontinent X 2, BM today, ?needs timed toileting?  OT fam ed Saturday with son, use slide board for safety.  PT CGA/min A transfers, bed S/min A, mod I power w/c mobility, ? Qualify for Roho cushion?   Revisions to Treatment Plan: N/A     Medical Summary Current Status: refuses methamine for bladder; foam on sacrum for prevention; progressing well- did family ed; encourage SB for transfers Weekly Focus/Goal: CGA-min assist currently  Barriers to Discharge: Behavior;Incontinence;Medication compliance;Home enviroment access/layout;Medical stability;Weight  Barriers to Discharge Comments:  uses power w/c at home Possible Resolutions to Barriers: power w/c; might be willing to use shorter SB   Continued Need for Acute Rehabilitation Level of Care: The patient requires daily medical management by a physician with specialized training in physical medicine and rehabilitation for the following reasons: Direction of a multidisciplinary physical rehabilitation program to maximize functional independence : Yes Medical management of patient stability for increased activity during participation in an intensive rehabilitation regime.: Yes Analysis of laboratory values and/or radiology reports with any subsequent need for medication adjustment and/or medical intervention. : Yes   I attest that I was present, lead the team conference, and concur with the assessment and plan of the team.   Jodell Cipro M 12/14/2019, 12:05 PM   Team conference was held via web/ teleconference due to [place] - 19

## 2019-12-13 NOTE — Plan of Care (Signed)
  Problem: RH BLADDER ELIMINATION Goal: RH STG MANAGE BLADDER WITH ASSISTANCE Description: STG Manage Bladder With Mod I Assistance Outcome: Progressing Goal: RH STG MANAGE BLADDER WITH MEDICATION WITH ASSISTANCE Description: STG Manage Bladder With Medication With Mod I Assistance. Outcome: Progressing   Problem: Consults Goal: RH GENERAL PATIENT EDUCATION Description: See Patient Education module for education specifics. Outcome: Progressing   Problem: RH SKIN INTEGRITY Goal: RH STG MAINTAIN SKIN INTEGRITY WITH ASSISTANCE Description: STG Maintain Skin Integrity With Mod I Assistance. Outcome: Progressing

## 2019-12-13 NOTE — Progress Notes (Signed)
[place] PHYSICAL MEDICINE & REHABILITATION PROGRESS NOTE  Subjective/Complaints:   Pt concerned about d/c and insurance not paying if leaves late on Friday.  Otherwise, said doing well- still no more dizziness/vertigo  ROS: Denies CP, shortness of breath, nausea, vomiting, diarrhea.  Objective: Vital Signs: Blood pressure 108/60, pulse 64, temperature 97.8 F (36.6 C), resp. rate 18, SpO2 96 %. No results found. No results for input(s): WBC, HGB, HCT, PLT in the last 72 hours. No results for input(s): NA, K, CL, CO2, GLUCOSE, BUN, CREATININE, CALCIUM in the last 72 hours.  Physical Exam: BP 108/60 (BP Location: Left Arm)   Pulse 64   Temp 97.8 F (36.6 C)   Resp 18   SpO2 96%  Constitutional: No distress . Vital signs reviewed. Sitting at angle in bed; working with OT, NAD HENT: Normocephalic.  Atraumatic. Eyes: EOMI. No discharge. Cardiovascular: No JVD. Respiratory: Normal effort.  No stridor. GI: Non-distended. Skin: Warm and dry.  Intact. Psych: Normal mood.  Normal behavior. Musc: No edema in extremities.  No tenderness in extremities. Neurological: Alert  Motor: Bilateral upper extremities: 5 /5 proximal distal, unchanged  Right lower extremity: Hip flexion, knee extension 2/5, ankle dorsiflexion 0/5, wiggles toes (baseline, stable) Left lower extremity: Hip flexion, knee extension 1 +/5, ankle dorsiflexion 0/5, wiggles toes (baseline, stable)  Assessment/Plan: 1. Functional deficits secondary to multiple sclerosis? exacerbation which require 3+ hours per day of interdisciplinary therapy in a comprehensive inpatient rehab setting.  Physiatrist is providing close team supervision and 24 hour management of active medical problems listed below.  Physiatrist and rehab team continue to assess barriers to discharge/monitor patient progress toward functional and medical goals  Care Tool:  Bathing    Body parts bathed by patient: Right arm, Left arm, Chest,  Abdomen, Front perineal area, Buttocks, Right upper leg, Left upper leg, Left lower leg, Right lower leg, Face   Body parts bathed by helper: Buttocks     Bathing assist Assist Level: Supervision/Verbal cueing     Upper Body Dressing/Undressing Upper body dressing   What is the patient wearing?: Pull over shirt    Upper body assist Assist Level: Set up assist    Lower Body Dressing/Undressing Lower body dressing      What is the patient wearing?: Pants, Incontinence brief     Lower body assist Assist for lower body dressing: Set up assist(assist for brief only)     Toileting Toileting    Toileting assist Assist for toileting: Minimal Assistance - Patient > 75%     Transfers Chair/bed transfer  Transfers assist     Chair/bed transfer assist level: Contact Guard/Touching assist     Locomotion Ambulation   Ambulation assist   Ambulation activity did not occur: N/A(did not perform prior to admission)          Walk 10 feet activity   Assist  Walk 10 feet activity did not occur: N/A        Walk 50 feet activity   Assist Walk 50 feet with 2 turns activity did not occur: N/A         Walk 150 feet activity   Assist Walk 150 feet activity did not occur: N/A         Walk 10 feet on uneven surface  activity   Assist Walk 10 feet on uneven surfaces activity did not occur: N/A         Wheelchair     Assist Will patient use wheelchair at discharge?:  Yes Type of Wheelchair: Power Wheelchair activity did not occur: Safety/medical concerns  Wheelchair assist level: Independent Max wheelchair distance: 150'    Wheelchair 50 feet with 2 turns activity    Assist    Wheelchair 50 feet with 2 turns activity did not occur: Safety/medical concerns   Assist Level: Independent   Wheelchair 150 feet activity     Assist Wheelchair 150 feet activity did not occur: Safety/medical concerns   Assist Level: Independent       Medical Problem List and Plan: 1.  Decreased functional mobility with severe vertigo secondary to MS exacerbation.  Patient completed 3-day course of Solu-Medrol.  Patient will need to be established with neurology on outpatient as she has not received follow-up since 2014             Continue CIR 2.  Antithrombotics: -DVT/anticoagulation: Lovenox             -antiplatelet therapy: N/A 3. Pain Management: Tylenol as needed 4. Mood: Provide emotional support             -antipsychotic agents: N/A 5. Neuropsych: This patient is capable of making decisions on her own behalf. 6. Skin/Wound Care: Routine skin checks 7. Fluids/Electrolytes/Nutrition: Routine in and outs.   8.  Vertigo.  Antivert PRN, monitor for efficacy  Improved/Resolved 9.  Neurogenic bowel/ bladder.  Continue Mandelamine for now.  Adjust bowel program.    PVRs ordered, only performed x1 on 12/24, with residual of 150, continue to monitor 10.  Hypophosphatemia  Phosphorus 3.7 on 12/28 12.  Acute blood loss anemia: Resolved  Hemoglobin 12.9 on 12/23 13.  Labile/soft blood pressure  Monitor with activity, asymptomatic at present  Pt willing to take BP at home and take midodrine if BP <90/60 or lightheaded.    Discussed importance of oral hydration with water  Relatively soft, but asymptomatic on 12/20 14. Hyperkalemia: Resolved  Potassium 4.1 on 12/23 15. Mild leukocytosis: Resolved  WBC 9.0 on 12/23  LOS: 11 days A FACE TO FACE EVALUATION WAS PERFORMED  Jerzee Jerome 12/13/2019, 8:35 AM

## 2019-12-13 NOTE — Progress Notes (Signed)
Physical Therapy Session Note  Patient Details  Name: [name] MRN: [id number] Date of Birth: Jan 24, 1946  Today's Date: 12/13/2019 PT Individual Time: 2536-6440 PT Individual Time Calculation (min): 54 min   Short Term Goals: Week 2:  PT Short Term Goal 1 (Week 2): STG=LTG due to ELOS  Skilled Therapeutic Interventions/Progress Updates:   Pt received on BSC w/ NT, finishing w/ toileting tasks. Therapist assisted w/ set-up of transfer back to power chair. Used slide board 2/2 increased space between [facility] and seat of power chair, performed w/ CGA. Pt performed power mobility to/from therapy gym w/o assist. Had pt set-up transfer to/from edge of mat w/ supervision. Pt needed verbal cues for problem solving transfer as she stated "this is not how I do it at home, my power chair is different". Then able to perform transfer w/ CGA w/o slide board. Worked on trunk control/strengthening at edge of mat including sit<>wedge cruntches w/o UE support 2x5, sit<>sidelying to each side w/ 1 UE support 2x5, shoulder press w/ 2kg ball w/ emphasis on core activation 3x5, and R/L lateral scooting w/ emphasis on slow/controlled movements and clearing bottom from mat.   Reminded pt to move slowly w/ bed mobility transitions 2/2 recent bout of L BPPV. Pt reports she is used to moving fast, educated on importance of lifestyle modifications to prevent further exacerbations of BPPV. Pt verbalized understanding.   Lateral scoot back to w/c w/ close supervision to CGA w/ increased time for a slightly uphill transfer. Returned to room and switched Char[employer] fundraiser for power chair in attempts to increase charge. Power chair unable to move faster than 0.6 mph. Also discussed d/c plan and any DME needs. Pt declines HH therapies despite education of benefits. Pt is also requesting shorter slide board to make her safer at home. Therapist in agreement that a slide board would make her lateral scoots safer. Will make team  aware.   Ended session tilted in power chair, all needs in reach.   Therapy Documentation Precautions:  Precautions Precautions: Fall Restrictions Weight Bearing Restrictions: No Vital Signs: Therapy Vitals Temp: 97.8 F (36.6 C) Pulse Rate: 64 Resp: 18 BP: 108/60 Patient Position (if appropriate): Lying Oxygen Therapy SpO2: 96 % O2 Device: Room Air Pain: Pain Assessment Pain Scale: 0-10 Pain Score: 0-No pain  Therapy/Group: Individual Therapy  Ascencion Coye K Deshan Hemmelgarn 12/13/2019, 10:08 AM

## 2019-12-14 ENCOUNTER — Inpatient Hospital Stay (HOSPITAL_COMMUNITY): Payer: Medicare HMO | Admitting: Occupational Therapy

## 2019-12-14 ENCOUNTER — Inpatient Hospital Stay (HOSPITAL_COMMUNITY): Payer: Medicare HMO | Admitting: Physical Therapy

## 2019-12-14 MED ORDER — METHENAMINE HIPPURATE 1 G PO TABS: 1.0000 g | ORAL_TABLET | Freq: Every day | ORAL | 0 refills | Status: DC

## 2019-12-14 MED ORDER — MECLIZINE HCL 25 MG PO TABS: 25.0000 mg | ORAL_TABLET | Freq: Three times a day (TID) | ORAL | 0 refills | Status: DC | PRN

## 2019-12-14 MED ORDER — ACETAMINOPHEN 325 MG PO TABS: 650.0000 mg | ORAL_TABLET | Freq: Four times a day (QID) | ORAL | Status: DC | PRN

## 2019-12-14 MED ORDER — CYANOCOBALAMIN 250 MCG PO TABS: 250.0000 ug | ORAL_TABLET | Freq: Every day | ORAL | 0 refills | Status: DC

## 2019-12-14 NOTE — Progress Notes (Signed)
[place] PHYSICAL MEDICINE & REHABILITATION PROGRESS NOTE  Subjective/Complaints:   Pt concerned if vertigo comes back, will need additional therapy to "fix it"- therapists are concerned pt will need more help at home than pt expects and will need more therapy for strengthening- d/w that usually see pt back in first month sometime, then prn after that.  She wants me to call son to go over everything.   ROS: Denies CP, shortness of breath, nausea, vomiting, diarrhea.  Objective: Vital Signs: Blood pressure 111/68, pulse 68, temperature 98 F (36.7 C), temperature source Oral, resp. rate 18, SpO2 97 %. No results found. No results for input(s): WBC, HGB, HCT, PLT in the last 72 hours. No results for input(s): NA, K, CL, CO2, GLUCOSE, BUN, CREATININE, CALCIUM in the last 72 hours.  Physical Exam: BP 111/68   Pulse 68   Temp 98 F (36.7 C) (Oral)   Resp 18   SpO2 97%  Constitutional: No distress . Vital signs reviewed. Sitting up in bed; appears comfortable,  NAD HENT: Normocephalic.  Atraumatic. Eyes: EOMI. No discharge. Cardiovascular: No JVD. Respiratory: Normal effort.  No stridor. GI: Non-distended. Skin: Warm and dry.  Intact. Psych: Normal mood.  Normal behavior. Musc: No edema in extremities.  No tenderness in extremities. Neurological: Alert  Motor: Bilateral upper extremities: 5 /5 proximal distal, unchanged  Right lower extremity: Hip flexion, knee extension 2/5, ankle dorsiflexion 0/5, wiggles toes (baseline, stable) Left lower extremity: Hip flexion, knee extension 1 +/5, ankle dorsiflexion 0/5, wiggles toes (baseline, stable)  Assessment/Plan: 1. Functional deficits secondary to multiple sclerosis? exacerbation which require 3+ hours per day of interdisciplinary therapy in a comprehensive inpatient rehab setting.  Physiatrist is providing close team supervision and 24 hour management of active medical problems listed below.  Physiatrist and rehab team  continue to assess barriers to discharge/monitor patient progress toward functional and medical goals  Care Tool:  Bathing    Body parts bathed by patient: Right arm, Left arm, Chest, Abdomen, Front perineal area, Buttocks, Right upper leg, Left upper leg, Left lower leg, Right lower leg, Face   Body parts bathed by helper: Buttocks     Bathing assist Assist Level: Supervision/Verbal cueing     Upper Body Dressing/Undressing Upper body dressing   What is the patient wearing?: Pull over shirt    Upper body assist Assist Level: Set up assist    Lower Body Dressing/Undressing Lower body dressing      What is the patient wearing?: Pants, Incontinence brief     Lower body assist Assist for lower body dressing: Set up assist(assist for brief only)     Toileting Toileting    Toileting assist Assist for toileting: Minimal Assistance - Patient > 75%     Transfers Chair/bed transfer  Transfers assist     Chair/bed transfer assist level: Contact Guard/Touching assist     Locomotion Ambulation   Ambulation assist   Ambulation activity did not occur: N/A(did not perform prior to admission)          Walk 10 feet activity   Assist  Walk 10 feet activity did not occur: N/A        Walk 50 feet activity   Assist Walk 50 feet with 2 turns activity did not occur: N/A         Walk 150 feet activity   Assist Walk 150 feet activity did not occur: N/A         Walk 10 feet on uneven surface  activity   Assist Walk 10 feet on uneven surfaces activity did not occur: N/A         Wheelchair     Assist Will patient use wheelchair at discharge?: Yes Type of Wheelchair: Power Wheelchair activity did not occur: Safety/medical concerns  Wheelchair assist level: Independent Max wheelchair distance: 150'    Wheelchair 50 feet with 2 turns activity    Assist    Wheelchair 50 feet with 2 turns activity did not occur: Safety/medical  concerns   Assist Level: Independent   Wheelchair 150 feet activity     Assist Wheelchair 150 feet activity did not occur: Safety/medical concerns   Assist Level: Independent      Medical Problem List and Plan: 1.  Decreased functional mobility with severe vertigo secondary to MS exacerbation.  Patient completed 3-day course of Solu-Medrol.  Patient will need to be established with neurology on outpatient as she has not received follow-up since 2014             Continue CIR 2.  Antithrombotics: -DVT/anticoagulation: Lovenox             -antiplatelet therapy: N/A 3. Pain Management: Tylenol as needed 4. Mood: Provide emotional support             -antipsychotic agents: N/A 5. Neuropsych: This patient is capable of making decisions on her own behalf. 6. Skin/Wound Care: Routine skin checks 7. Fluids/Electrolytes/Nutrition: Routine in and outs.   8.  Vertigo.  Antivert PRN, monitor for efficacy  Improved/Resolved 9.  Neurogenic bowel/ bladder.  Continue Mandelamine for now.  Adjust bowel program.    PVRs ordered, only performed x1 on 12/24, with residual of 150, continue to monitor  12/30- voiding but frequently- q1 on average 10.  Hypophosphatemia  Phosphorus 3.7 on 12/28 12.  Acute blood loss anemia: Resolved  Hemoglobin 12.9 on 12/23 13.  Labile/soft blood pressure  Monitor with activity, asymptomatic at present  Pt willing to take BP at home and take midodrine if BP <90/60 or lightheaded.    Discussed importance of oral hydration with water  Relatively soft, but asymptomatic on 12/20  12/30- BP better 11/68 14. Hyperkalemia: Resolved  Potassium 4.1 on 12/23 15. Mild leukocytosis: Resolved  WBC 9.0 on 12/23  LOS: 12 days A FACE TO FACE EVALUATION WAS PERFORMED  Aryan Bello 12/14/2019, 9:23 AM

## 2019-12-14 NOTE — Progress Notes (Addendum)
Occupational Therapy Session Note  Patient Details  Name: [name] MRN: [id number] Date of Birth: 1946-10-22  Today's Date: 12/14/2019 OT Individual Time: 7619-5093 and 1400-1455 OT Individual Time Calculation (min): 73 min and 55 min   Short Term Goals: Week 2:  OT Short Term Goal 1 (Week 2): STG=LTG due to LOS  Skilled Therapeutic Interventions/Progress Updates:    Session One: Pt seen for OT ADL bathing/dressing session. Pt sitting up in power w/c upon arrival, denying pain and agreeable to tx session, requesting bathing tasks. ADL Re-Training: UB bathing/dressing completed from w/c level with set-up of bathing supplies. Pt gathered shirt from w/c level mod I. She returned to bed to complete LB bathing/dressing. Pt insistent on using hospital bed functions to assist with positioning during self-care tasks at bed level despite not having these features at home though pt does complete LB bathing/dressing from bed level PTA. LB bathing/dressing completed with set-up/supervision at bed level, rolling to R/L in order to complete pericare/buttock hygiene. Total A to don incontinence brief and pt donned pants bed level with increased time.  Grooming tasks completed from w/c level mod I. Transfers/mobility: Sliding board transfer power w/c>EOB min A overall with significantly increased time and VCs for awareness and sequencing.  Sitting EOB>supine with supervision, pt able to manage B LEs onto EOB. Bed mobility mod I with use of bed rails.  Supine>sitting EOB with supervision. Sliding board transfer EOB>power w/c with sliding board and close supervision with VCs for anterior weightshift. Pt left seated in power w/c at end of session, all needs in reach.   Session Two: Pt seen for OT session focusing on functional mobility and transfers. Pt sitting up in power w/c upon arrival, agreeable to tx session and denying pain, requesting toileting task. Completed sliding board transfer power  w/c>drop arm BSC with min A to place board, total A to remove board. Pt completed push up in order for clothing management to be completed total A. Continent void and pt completed hygiene with set-up. Transferred back to power w/c with guarding assist using sliding board. Managed power w/c throughout unit mod I. Sliding board transfer w/c>EOM with supervision, transitioning to supine on mat mod I. Completed functional mobility on mat transitioning supine>long sitting>circle sitting. Completed with supervision x3 trials. Education provided regarding functional use of circle sit position. Pt able to doff/don B shoes from circle sitting position.  Completed sliding board transfer EOM>power w/c with pt able to place board with VCs for technique and remove with increased time. Then completed w/c<>EOM transfers without board via scoot with supervision and VCs for buttock clearance. Cont with education on sliding board vs no board for d/c use and when to alternate btwn these two transfer methods. Pt returned to room at end of session, left seated in power w/c with all needs in reach. Reviewed pressure relief schedule.   Therapy Documentation Precautions:  Precautions Precautions: Fall Restrictions Weight Bearing Restrictions: No Pain: Pain Assessment Pain Scale: 0-10 Pain Score: 0-No pain   Therapy/Group: Individual Therapy  Kelse Ploch L 12/14/2019, 7:05 AM

## 2019-12-14 NOTE — Discharge Summary (Signed)
Physician Discharge Summary  Patient ID: ALIVYA WEGMAN MRN: [id number] DOB/AGE: November 05, 1946 73 y.o.  Admit date: 12/02/2019 Discharge date: 12/16/2019  Discharge Diagnoses:  Principal Problem:   Multiple sclerosis (HCC) Active Problems:   Paraplegia, unspecified (HCC)   Multiple sclerosis exacerbation (HCC)   Vertigo as late effect of stroke   Vertigo   Hypophosphatemia   Acute blood loss anemia   Labile blood pressure   Arterial hypotension   Borderline blood pressure Neurogenic bowel and bladder  Discharged Condition: Stable  Significant Diagnostic Studies: CT Head Wo Contrast  Result Date: 11/26/2019 CLINICAL DATA:  Dizziness and weakness for 3 weeks EXAM: CT HEAD WITHOUT CONTRAST TECHNIQUE: Contiguous axial images were obtained from the base of the skull through the vertex without intravenous contrast. COMPARISON:  None. FINDINGS: Brain: Brain atrophy noted with white matter microvascular ischemic changes about the lateral ventricle throughout both cerebral hemispheres. No acute intracranial hemorrhage definite new infarction mass lesion, midline shift, herniation, hydrocephalus, extra-axial fluid collection. No focal mass effect or edema. Cisterns are patent. No cerebellar abnormality. Vascular: Intracranial atherosclerosis noted.  No hyperdense vessel. Skull: Normal. Negative for fracture or focal lesion. Sinuses/Orbits: No acute finding. Other: None. IMPRESSION: Brain atrophy and chronic white matter microvascular changes. No acute intracranial abnormality by noncontrast CT. Electronically Signed   By: Judie Petit.  Shick M.D.   On: 11/26/2019 11:37   MR BRAIN WO CONTRAST  Result Date: 11/26/2019 CLINICAL DATA:  Focal neurologic deficit. Suspect stroke. History of multiple sclerosis. EXAM: MRI HEAD WITHOUT CONTRAST TECHNIQUE: Multiplanar, multiecho pulse sequences of the brain and surrounding structures were obtained without intravenous contrast. COMPARISON:  CT head 11/26/2019.  No  prior MRI for comparison. FINDINGS: Brain: Negative for acute infarct. Numerous periventricular white matter hyperintensities. Pattern consistent with multiple sclerosis. Hyperintensities extend into the deep white matter in the parietal lobe bilaterally. Brainstem and cerebellum intact. Cerebral volume mildly diminished but normal for age. Negative for hydrocephalus. Negative for hemorrhage or mass. Vascular: Normal arterial flow voids Skull and upper cervical spine: Negative Sinuses/Orbits: Negative Other: None IMPRESSION: Negative for acute infarct Periventricular deep white matter hyperintensities compatible with multiple sclerosis. No prior MRI for comparison purposes. Electronically Signed   By: Marlan Palau M.D.   On: 11/26/2019 17:15   MR BRAIN W CONTRAST  Result Date: 11/27/2019 CLINICAL DATA:  Peripheral vertigo EXAM: MRI HEAD WITH CONTRAST TECHNIQUE: Multiplanar, multiecho pulse sequences of the brain and surrounding structures were obtained with intravenous contrast. CONTRAST:  35mL GADAVIST GADOBUTROL 1 MMOL/ML IV SOLN COMPARISON:  Brain MRI 11/26/2019 FINDINGS: There are 2 subtle foci of contrast enhancement within the periventricular white matter. The first is on the right, and the periatrial region (series 12, image 9). The second is on the left, near the superior left lateral ventricle (series 13, image 21). No other abnormal contrast enhancement. IMPRESSION: Two small foci of contrast enhancement consistent with active demyelination within the periventricular white matter. Electronically Signed   By: Deatra Robinson M.D.   On: 11/27/2019 01:04   DG Chest Portable 1 View  Result Date: 11/26/2019 CLINICAL DATA:  Pt in from home via [facility] with c/o dizziness and weakness x 3 wks. EXAM: PORTABLE CHEST 1 VIEW COMPARISON:  Chest radiograph 04/23/2007 FINDINGS: The heart size and mediastinal contours are within normal limits. Aortic arch calcification. The lungs are clear bilaterally. No  pneumothorax or pleural effusion. No acute finding in the visualized skeleton. IMPRESSION: 1. No acute cardiopulmonary process. 2. Aortic atherosclerosis. Electronically Signed   By: Harriett Sine  Dimas Aguas M.D.   On: 11/26/2019 11:38    Labs:  Basic Metabolic Panel: Recent Labs  Lab 12/12/19 0742  PHOS 3.7    CBC: No results for input(s): WBC, NEUTROABS, HGB, HCT, MCV, PLT in the last 168 hours.  CBG: No results for input(s): GLUCAP in the last 168 hours.  Family history.  Mother with CVA.  Brother with neuropathy.  Father with CVA.  Negative for hypertension, diabetes mellitus, colon cancer  Brief HPI:   [name] is a 73 y.o. right-handed female with history of multiple sclerosis diagnosed in 1982 was last seen by neurology services Dr. Lonni Fix in 2014 prior to that had seen Dr. Jeneen Rinks love prior to his retirement.  Patient maintained on Mandelamine for history of recurrent UTIs.  Per chart review lives alone.  Essentially wheelchair-bound.  1 level home with ramped entrance.  She propels to the sink sponge bathe and mostly microwaves her meals.  Her son brings her groceries.  Presented 11/27/2019 with increasing dizziness.  Admission chemistries unremarkable urine study negative nitrite, WBC 14,300, SARS coronavirus negative.  Cranial CT scan and MRI unremarkable for acute intracranial process.  Periventricular deep white matter hyperintensities compatible with multiple sclerosis.  There was no prior MRIs for comparison purposes.  Neurology follow-up placed on Solu-Medrol 1 g x 3 days for suspected MS exacerbation.  Subcutaneous Lovenox for DVT prophylaxis.  Tolerating a regular diet.  Patient was admitted for a comprehensive rehab program.   Hospital Course: Louine D Soucek was admitted to rehab 12/02/2019 for inpatient therapies to consist of PT, ST and OT at least three hours five days a week. Past admission physiatrist, therapy team and rehab RN have worked together to provide  customized collaborative inpatient rehab.  Pertaining to patient's vertigo as well as history of multiple sclerosis suspect exacerbation she had completed a 3-day course of Solu-Medrol.  She had not been established with neurology services since 2014 and discussed at length the need for follow-up with neurology services with ambulatory referral obtained.  Subcutaneous Lovenox for DVT prophylaxis. Pain managed with use of Tylenol.  Vertigo continue to improve with Antivert as needed.  .  Neurogenic bowel bladder with recurrent UTIs latest urine study negative she continued on mandelamine.  Bowel program established.  Latest postvoid residual of 150.  Acute blood loss anemia stable latest hemoglobin 12.9.  Blood pressure remained a bit soft asymptomatic maintained on ProAmatine only if needed.   Blood pressures were monitored on TID basis and soft and controlled  Marlana Salvage is continent of bowel and bladder.  Marlana Salvage has made gains during rehab stay and is attending therapies  Marlana Salvage will continue to receive follow up therapies   after discharge  Rehab course: During patient's stay in rehab weekly team conferences were held to monitor patient's progress, set goals and discuss barriers to discharge. At admission, patient required moderate assist lateral scoot transfers, total assist anterior posterior transfers, moderate assist supine to sit.  Moderate assist upper body bathing max assist lower body bathing, mod assist upper body dressing total assist lower body dressing.  Physical exam.  Blood pressure 102/58 pulse 62 temperature 98.1 respirations 16 oxygen saturations 95% room air Constitutional well-developed well-nourished H EENT Head.  Normocephalic and atraumatic Eyes.  Pupils round and reactive to light without discharge no nystagmus Neck.  Supple nontender no tracheal deviation no thyromegaly Cardiovascular regular rate rhythm without any extra sounds or murmur heard Respiratory.  Effort normal no  stridor no respiratory distress Abdomen.  Soft nontender positive bowel sounds without rebound Musculoskeletal no edema or tenderness in extremities Neurological.  Alert makes good eye contact follows full commands fair medical historian. Motor.  Bilateral upper extremities 5 out of 5 proximal distal right lower extremity hip flexion knee extension 2+ out of 5, ankle dorsiflexion 0 out of 5, wiggles toes. Left lower extremity hip flexion knee extension 1 out of 5 ankle dorsiflexion 0 out of 5.  Sensation intact  /She  has had improvement in activity tolerance, balance, postural control as well as ability to compensate for deficits. Francis Dowse/She has had improvement in functional use RUE/LUE  and RLE/LLE as well as improvement in awareness.  Supine to sit supervision level with head of bed elevated.  Slide board transfers to bedside commode with contact-guard assist for sitting balance.  Patient able to press up with use of bilateral upper extremities on bedside commode armrests for max assist management of pants and brief.  Patient is max assist to don new briefs and don pants.  Patient reports she typically performs pericare and clothing management at bed level.  Slide board transfers back to bed then to power wheelchair with contact-guard assist for trunk control.  Patient was fitted with a Roho cushion.  Patient does complete lower body bathing dressing from bed level prior to admission.  Lower body dressing bathing completed with set up supervision of bed level.  Full family teaching was completed plan discharge to home Patient was resistant to home health therapies due to Covid concerns and discussed with patient and son who at this time requested to hold on further therapies at home.       Disposition: Discharge to home    Diet: Regular  Special Instructions: No driving smoking or alcohol  Medications at discharge 1.  Tylenol as needed 2.  Antivert 25 mg p.o. 3 times daily as needed  dizziness 3.mandelamine 1 g p.o. daily 4.  Vitamin B12 250 mcg p.o. daily  Discharge Instructions    Ambulatory referral to Neurology   Complete by: As directed    An appointment is requested in approximately 4 weeks multiple sclerosis exacerbation     Allergies as of 12/15/2019      Reactions   Procaine Hcl    REACTION: UNSPECIFIED      Medication List    STOP taking these medications   promethazine 25 MG tablet Commonly known as: PHENERGAN     TAKE these medications   acetaminophen 325 MG tablet Commonly known as: TYLENOL Take 2 tablets (650 mg total) by mouth every 6 (six) hours as needed for mild pain (or Fever >/= 101).   meclizine 25 MG tablet Commonly known as: ANTIVERT Take 1 tablet (25 mg total) by mouth 3 (three) times daily as needed for dizziness.   methenamine 1 g tablet Commonly known as: HIPREX Take 1 tablet (1 g total) by mouth daily.   vitamin B-12 250 MCG tablet Commonly known as: CYANOCOBALAMIN Take 1 tablet (250 mcg total) by mouth daily.      Follow-up Information    Lovorn, Aundra MilletMegan, MD Follow up.   Specialty: Physical Medicine and Rehabilitation Why: Only as needed at patient request Contact information: [address] [place] [address] [phone]           Signed: Mcarthur RossettiDaniel J Brynlynn Walko 12/15/2019, 5:07 AM

## 2019-12-14 NOTE — Progress Notes (Signed)
Physical Therapy Session Note  Patient Details  Name: [name] MRN: [id number] Date of Birth: 02/05/1946  Today's Date: 12/14/2019 PT Individual Time: 0800-0900 PT Individual Time Calculation (min): 60 min   Short Term Goals: Week 2:  PT Short Term Goal 1 (Week 2): STG=LTG due to ELOS  Skilled Therapeutic Interventions/Progress Updates:    Pt received seated in bed, agreeable to PT session. No complaints of pain. Supine to sit at Supervision level with HOB elevated. Slide board transfer to [facility] with CGA for sitting balance. Pt is able to press up with use of BUE on BSC armrests for max A management of pants and brief. Pt is able to continently void urine on BSC and perform pericare with setup A. Pt is max A to don new brief and don pants. Per pt report she typically performs pericare and clothing management at bed level. Slide board transfer back to bed then to power w/c with CGA for trunk control. Per pt report wheelchair unable to be adjusted to "outdoor" speed. Adjusted seat height and w/c exhibits improved speed. Discussed follow up therapy with pt reporting she feels uncomfortable with HHPT due to fears of Covid-19 but pt is open to idea of OPPT. Also discussed current Roho cushion that pt is using. Pt wishing to private purchase Roho cushion, provided handout for where to purchase online and will discuss management of cushion with patient next session. Pt left seated in power w/c in room with needs in reach at end of session.  Therapy Documentation Precautions:  Precautions Precautions: Fall Restrictions Weight Bearing Restrictions: No    Therapy/Group: Individual Therapy   Excell Seltzer, PT, DPT  12/14/2019, 12:28 PM

## 2019-12-14 NOTE — Progress Notes (Signed)
Social Work Patient ID: Mervyn Skeeters, female   DOB: Jul 10, 1946, 73 y.o.   MRN: [id number]   Have spoken with pt and son to review team conference.  Both feeling ready for d/c on 1/1.  Son completed family ed over the weekend.  Pt remains resistant to any HH follow up due to COVID concerns.  Pt and son aware that, if she changes her mind after returning home, that I would be happy to refer at that time. Both have my contact information. (Son is aware of her concerns and wants to respect that but have the option if she changes her mind.  He is also aware that our team IS recommending follow up.)    Talin Feister, LCSW

## 2019-12-15 ENCOUNTER — Inpatient Hospital Stay (HOSPITAL_COMMUNITY): Payer: Medicare HMO | Admitting: Physical Therapy

## 2019-12-15 ENCOUNTER — Inpatient Hospital Stay (HOSPITAL_COMMUNITY): Payer: Medicare HMO | Admitting: Occupational Therapy

## 2019-12-15 ENCOUNTER — Inpatient Hospital Stay (HOSPITAL_COMMUNITY): Payer: Medicare HMO

## 2019-12-15 NOTE — Progress Notes (Signed)
[facility] PHYSICAL MEDICINE & REHABILITATION PROGRESS NOTE  Subjective/Complaints:   Discussed to see PCP or myself to order PT vertigo evaluation if she gets dizziness again.  Spoke to son- answered all questions about plans after d/c if needs vertigo evaluation again.   ROS: Denies CP, shortness of breath, nausea, vomiting, diarrhea.  Objective: Vital Signs: Blood pressure 112/60, pulse 64, temperature 98.1 F (36.7 C), resp. rate 19, SpO2 97 %. No results found. No results for input(s): WBC, HGB, HCT, PLT in the last 72 hours. No results for input(s): NA, K, CL, CO2, GLUCOSE, BUN, CREATININE, CALCIUM in the last 72 hours.  Physical Exam: BP 112/60 (BP Location: Left Arm)   Pulse 64   Temp 98.1 F (36.7 C)   Resp 19   SpO2 97%  Constitutional: No distress . Vital signs reviewed. Sitting up in bed; appears comfortable; watching TV,  NAD HENT: Normocephalic.  Atraumatic. Eyes: EOMI. No discharge. Cardiovascular: No JVD. Respiratory: Normal effort.  No stridor. GI: Non-distended. Skin: Warm and dry.  Intact. Psych: Normal mood.  Normal behavior. Musc: No edema in extremities.  No tenderness in extremities. Neurological: Alert  Motor: Bilateral upper extremities: 5 /5 proximal distal, unchanged  Right lower extremity: Hip flexion, knee extension 2/5, ankle dorsiflexion 0/5, wiggles toes (baseline, stable) Left lower extremity: Hip flexion, knee extension 1 +/5, ankle dorsiflexion 0/5, wiggles toes (baseline, stable)  Assessment/Plan: 1. Functional deficits secondary to multiple sclerosis? exacerbation which require 3+ hours per day of interdisciplinary therapy in a comprehensive inpatient rehab setting.  Physiatrist is providing close team supervision and 24 hour management of active medical problems listed below.  Physiatrist and rehab team continue to assess barriers to discharge/monitor patient progress toward functional and medical goals  Care Tool:  Bathing     Body parts bathed by patient: Right arm, Left arm, Chest, Abdomen, Front perineal area, Buttocks, Right upper leg, Left upper leg, Left lower leg, Right lower leg, Face   Body parts bathed by helper: Buttocks     Bathing assist Assist Level: Set up assist     Upper Body Dressing/Undressing Upper body dressing   What is the patient wearing?: Pull over shirt    Upper body assist Assist Level: Independent    Lower Body Dressing/Undressing Lower body dressing      What is the patient wearing?: Pants, Incontinence brief     Lower body assist Assist for lower body dressing: Set up assist(assist for incontinence brief only)     Toileting Toileting    Toileting assist Assist for toileting: Independent with assistive device Assistive Device Comment: With clothing management completed at bed level (way she completed it PTA)   Transfers Chair/bed transfer  Transfers assist     Chair/bed transfer assist level: Set up assist     Locomotion Ambulation   Ambulation assist   Ambulation activity did not occur: N/A(did not perform prior to admission)          Walk 10 feet activity   Assist  Walk 10 feet activity did not occur: N/A        Walk 50 feet activity   Assist Walk 50 feet with 2 turns activity did not occur: N/A         Walk 150 feet activity   Assist Walk 150 feet activity did not occur: N/A         Walk 10 feet on uneven surface  activity   Assist Walk 10 feet on uneven surfaces activity did  not occur: N/A         Wheelchair     Assist Will patient use wheelchair at discharge?: Yes Type of Wheelchair: Power Wheelchair activity did not occur: Safety/medical concerns  Wheelchair assist level: Independent Max wheelchair distance: 150'    Wheelchair 50 feet with 2 turns activity    Assist    Wheelchair 50 feet with 2 turns activity did not occur: Safety/medical concerns   Assist Level: Independent   Wheelchair 150  feet activity     Assist Wheelchair 150 feet activity did not occur: Safety/medical concerns   Assist Level: Independent      Medical Problem List and Plan: 1.  Decreased functional mobility with severe vertigo secondary to MS exacerbation.  Patient completed 3-day course of Solu-Medrol.  Patient will need to be established with neurology on outpatient as she has not received follow-up since 2014             Continue CIR 2.  Antithrombotics: -DVT/anticoagulation: Lovenox             -antiplatelet therapy: N/A 3. Pain Management: Tylenol as needed 4. Mood: Provide emotional support             -antipsychotic agents: N/A 5. Neuropsych: This patient is capable of making decisions on her own behalf. 6. Skin/Wound Care: Routine skin checks 7. Fluids/Electrolytes/Nutrition: Routine in and outs.   8.  Vertigo.  Antivert PRN, monitor for efficacy  Improved/Resolved 9.  Neurogenic bowel/ bladder.  Continue Mandelamine for now.  Adjust bowel program.    PVRs ordered, only performed x1 on 12/24, with residual of 150, continue to monitor  12/30- voiding but frequently- q1 on average 10.  Hypophosphatemia  Phosphorus 3.7 on 12/28 12.  Acute blood loss anemia: Resolved  Hemoglobin 12.9 on 12/23 13.  Labile/soft blood pressure  Monitor with activity, asymptomatic at present  Pt willing to take BP at home and take midodrine if BP <90/60 or lightheaded.    Discussed importance of oral hydration with water  Relatively soft, but asymptomatic on 12/20  12/30- BP better 11/68 14. Hyperkalemia: Resolved  Potassium 4.1 on 12/23 15. Mild leukocytosis: Resolved  WBC 9.0 on 12/23  16. Dispo  12/30- d/c tomorrow- will need ONE f/u with me.  LOS: 13 days A FACE TO FACE EVALUATION WAS PERFORMED  [name] 12/15/2019, 9:35 AM

## 2019-12-15 NOTE — Discharge Instructions (Signed)
Inpatient Rehab Discharge Instructions  [name] Discharge date and time: No discharge date for patient encounter.   Activities/Precautions/ Functional Status: Activity: activity as tolerated Diet: regular diet Wound Care: keep wound clean and dry Functional status:  ___ No restrictions     ___ Walk up steps independently ___ 24/7 supervision/assistance   ___ Walk up steps with assistance ___ Intermittent supervision/assistance  ___ Bathe/dress independently ___ Walk with walker     _x__ Bathe/dress with assistance ___ Walk Independently    ___ Shower independently ___ Walk with assistance    ___ Shower with assistance ___ No alcohol     ___ Return to work/school ________    COMMUNITY REFERRALS UPON DISCHARGE:    Home Health:   If you are to decide you do want to have home health therapies, please contact Lennart Pall (social worker) @ [phone] to arrange.  Medical Equipment/Items Ordered: 24" transfer board                                                     Agency/Supplier:  [employer] @ [phone]    Special Instructions: No driving smoking or alcohol   My questions have been answered and I understand these instructions. I will adhere to these goals and the provided educational materials after my discharge from the hospital.  Patient/Caregiver Signature _______________________________ Date __________  Clinician Signature _______________________________________ Date __________  Please bring this form and your medication list with you to all your follow-up doctor's appointments.

## 2019-12-15 NOTE — Plan of Care (Signed)
  Problem: Consults Goal: RH GENERAL PATIENT EDUCATION Description: See Patient Education module for education specifics. Outcome: Progressing   Problem: RH BLADDER ELIMINATION Goal: RH STG MANAGE BLADDER WITH ASSISTANCE Description: STG Manage Bladder With Mod I Assistance Outcome: Progressing Goal: RH STG MANAGE BLADDER WITH MEDICATION WITH ASSISTANCE Description: STG Manage Bladder With Medication With Mod I Assistance. Outcome: Progressing   Problem: RH SKIN INTEGRITY Goal: RH STG MAINTAIN SKIN INTEGRITY WITH ASSISTANCE Description: STG Maintain Skin Integrity With Mod I Assistance. Outcome: Progressing   

## 2019-12-15 NOTE — Progress Notes (Signed)
Physical Therapy Session Note  Patient Details  Name: [name] MRN: [id number] Date of Birth: 11/08/1946  Today's Date: 12/15/2019 PT Individual Time: 1330-1429 PT Individual Time Calculation (min): 59 min   Short Term Goals: Week 1:  PT Short Term Goal 1 (Week 1): Pt will perform bed mobility with min A  PT Short Term Goal 1 - Progress (Week 1): Met PT Short Term Goal 2 (Week 1): Pt will perform least restrictive transfer with min A PT Short Term Goal 2 - Progress (Week 1): Met PT Short Term Goal 3 (Week 1): Pt will initiate power w/c mobility PT Short Term Goal 3 - Progress (Week 1): Met Week 2:  PT Short Term Goal 1 (Week 2): STG=LTG due to ELOS Week 3:     Skilled Therapeutic Interventions/Progress Updates:      Therapy Documentation Precautions:  Precautions Precautions: Fall Restrictions Weight Bearing Restrictions: No  PAIN  Denies pain this pm  Pt initially oob in pwc requesting to use BR.  wc to/from commode w/sliding board w/setup assist and min assist for transfer.  Pt able to perform hygiene independently.  Total assist for managing pants/brief, able to boost herself for this. wc initially limited to very slow speed.  Pt educated re:  Need to have seat height adjusted to lowest position to allow for greater speed settings.  Pt able to lower seat height then able to adjust speed w/verbal cues/instruction.   Pt operates wc in room and to/from gym including set up for transfers independently.  wc to mat via lateral scoot w/min assist for LE positioining/adjustment.  Seated on edge of mat worked on postural strengthening and core stability/strength.  Post recline to upright sit x 15, forward flexion w/return to upright via rolling bolster on tray table progresssing to passing bolster forward and back w/therapist.  Attempted lateral bends to elbow but pt voiced concern that continueing this might cause dizzyness. Boosting on mat L/R for UE and core conditioning. Pt  w/questions regarding OPPT for vestibular issues should they occur in future, discussed process for obtaining referral and scheduling appt w/appropriate provider.  Lateral scoot transfer mat to wc w/min assist as above.  Operates [place] independently from gym to room.  Pt w/questions regarding roho cushion and pros/cons discussed w/pt.   Pt left oob in wc w/needs in reach.   Therapy/Group: Individual Therapy  Callie Fielding, PT   12/15/2019, 3:47 PM

## 2019-12-15 NOTE — Progress Notes (Signed)
Physical Therapy Discharge Summary  Patient Details  Name: [name] MRN: [id number] Date of Birth: February 26, 1946  Today's Date: 12/15/2019 PT Individual Time: 1100-1200 PT Individual Time Calculation (min): 60 min    Patient has met 4 of 5 long term goals due to improved activity tolerance, improved balance, improved postural control, increased strength and ability to compensate for deficits.  Patient to discharge at a wheelchair level Modified Independent.   Patient's care partner is independent to provide the necessary physical assistance at discharge. Pt's son has completed hands-on family education and is safe to assist pt upon d/c home.  Reasons goals not met: Pt did not meet setup A level for car transfer and remains at min A for car transfer via use of slide board. Per pt her son will either assist her with slide board transfer in/out of car or will "just pick me up" and place her into his truck. Pt is aware that therapy recommendation is that pt perform slide board transfer in/out of level car seat. Pt has met goals adequately for a safe d/c home.  Recommendation:  Patient will benefit from ongoing skilled PT services in outpatient setting to continue to advance safe functional mobility, address ongoing impairments in balance, endurance, strength, safety, and minimize fall risk. Pt has declined offer of follow-up services but is aware of therapy recommendations of services.  Equipment: slide board; pt already owns all other necessary equipment  Reasons for discharge: treatment goals met and discharge from hospital  Patient/family agrees with progress made and goals achieved: Yes   Skilled Intervention: Pt received seated in power w/c in room, agreeable to PT session. No complaints of pain. Pt is able to perform lateral scoot and SB transfers to/from bed at setup A. Pt aware that therapy is recommending setup A for slide board transfers but that therapy is not able to replicate  pt's idea home setup here in the hospital and that assuming her home setup is ideal with regards to bed height, power chair height, commode seat height, etc. that pt will be able to perform lateral scoot transfers at home at at mod I level. Pt is at mod I level for supine to/from sit and rolling in bed with use of bedrail. Per pt at home she uses bedframe and bedpost to assist her with bed mobility. Pt is independent with power w/c mobility. Provided handout for Roho cushion management, reiterated that pt does not currently require a Roho cushion but if she wishes to private purchase one she can. Pt left seated in power w/c in room with needs in reach at end of session.  PT Discharge Precautions/Restrictions Precautions Precautions: Fall Restrictions Weight Bearing Restrictions: No Vision/Perception  Perception Perception: Within Functional Limits Praxis Praxis: Intact  Cognition Overall Cognitive Status: Within Functional Limits for tasks assessed Arousal/Alertness: Awake/alert Orientation Level: Oriented X4 Attention: Sustained Focused Attention: Appears intact Sustained Attention: Appears intact Memory: Appears intact Awareness: Impaired Awareness Impairment: Anticipatory impairment Safety/Judgment: Appears intact Sensation Sensation Light Touch: Appears Intact Proprioception: Appears Intact Coordination Gross Motor Movements are Fluid and Coordinated: No Fine Motor Movements are Fluid and Coordinated: Yes Coordination and Movement Description: impaired 2/2 generalized weakness LE>UE and L>R; dx of MS Motor  Motor Motor: Other (comment) Motor - Skilled Clinical Observations: impaired 2/2 generalized weakness due to MS exacerbation Motor - Discharge Observations: impaired 2/2 generalized weakness from MS exac; improved since eval  Mobility Bed Mobility Bed Mobility: Rolling Right;Rolling Left;Supine to Sit;Sit to Supine Rolling Right: Independent  with assistive  device Rolling Left: Independent with assistive device Supine to Sit: Independent with assistive device Sit to Supine: Independent with assistive device Transfers Transfers: Lateral/Scoot Transfers Lateral/Scoot Transfers: Set up assist Transfer (Assistive device): Other (Comment)(slide board vs no device) Locomotion  Gait Ambulation: No Gait Gait: No Stairs / Additional Locomotion Stairs: No Wheelchair Mobility Wheelchair Mobility: Yes Wheelchair Assistance: Independent with Camera operator: Power Wheelchair Parts Management: Independent Distance: 150  Trunk/Postural Assessment  Cervical Assessment Cervical Assessment: Exceptions to WFL(forward head; extensive neck flexion) Thoracic Assessment Thoracic Assessment: Exceptions to WFL(kyphotic; rounded shoulders) Lumbar Assessment Lumbar Assessment: Exceptions to WFL(posterior pelvic tilt) Postural Control Postural Control: Deficits on evaluation  Balance Balance Balance Assessed: Yes Static Sitting Balance Static Sitting - Balance Support: Feet supported;No upper extremity supported Static Sitting - Level of Assistance: 6: Modified independent (Device/Increase time) Dynamic Sitting Balance Dynamic Sitting - Balance Support: Feet supported;During functional activity Dynamic Sitting - Level of Assistance: 6: Modified independent (Device/Increase time) Extremity Assessment   RLE Assessment RLE Assessment: Exceptions to [facility] General Strength Comments: impaired, see below RLE Strength RLE Overall Strength: Deficits Right Hip Flexion: 3/5 Right Knee Flexion: 2/5 Right Knee Extension: 2/5 Right Ankle Dorsiflexion: 0/5 LLE Assessment LLE Assessment: Exceptions to [facility] General Strength Comments: impaired, see below LLE Strength LLE Overall Strength: Deficits Left Hip Flexion: 1/5 Left Knee Flexion: 2/5 Left Knee Extension: 2/5 Left Ankle Dorsiflexion: 0/5     Excell Seltzer, PT,  DPT 12/15/2019, 3:56 PM

## 2019-12-15 NOTE — Progress Notes (Signed)
Occupational Therapy Session Note  Patient Details  Name: [name] MRN: [id number] Date of Birth: 06-Oct-1946  Today's Date: 12/15/2019 OT Individual Time: 8295-6213 OT Individual Time Calculation (min): 56 min    Short Term Goals: Week 2:  OT Short Term Goal 1 (Week 2): STG=LTG due to LOS  Skilled Therapeutic Interventions/Progress Updates:    Pt seen for OT ADL bathing/dressing session. Pt awake in supine upon arrival, no complaints of pain but voices having had incontient episode. Completed hygiene at bed level with set-up and assist to remove soiled brief. Pt then voiced need for BM. Transferred to sitting EOB with supervision and use of bed rail. Padeed sliding board/scoot transfer completed to drop arm BSC, pt with incontinent BM with straining to get to [facility] during min A transfer requiring increased time. COntinent BM once on BSC. Completed hygiene with set-up and lateral lean. Returned to bed for thoroughness of hygiene and donned pants with set-up.  Supervision-mod I transfer supine>sitting EOB and sliding board transfer to power w/c. UB bathing/dressing and grooming tasks completed from w/c level at sink mod I.   Pt left seated in w/c at end of session at sink with PA present to go over d/c information. NT made aware of pt's position.  Therapy Documentation Precautions:  Precautions Precautions: Fall Restrictions Weight Bearing Restrictions: No Pain:   No/denies pain   Therapy/Group: Individual Therapy  [name] L 12/15/2019, 6:55 AM

## 2019-12-15 NOTE — Progress Notes (Signed)
Occupational Therapy Discharge Summary  Patient Details  Name: [name] MRN: [id number] Date of Birth: 18-Nov-1946   Patient has met 7 of 7 long term goals due to improved activity tolerance, improved balance, postural control, ability to compensate for deficits and improved coordination.  Patient to discharge at overall Modified Independent level.  Patient's care partner is independent to provide the necessary physical assistance at discharge.  Pt lives alone and was mod I PTA with use of power w/c and BSC at bedside. Pt reports to feeling at baseline level with vertigo symptoms subsided. Pt's son attended caregiver education session and is aware of his mother's functional mobility and independence with ADLs. He is able to provide physical assist PRN. He and pt were educated on therapist's recommendation for d/c, including use of sliding board transfers and follow-up therapy services. Pt is resistent to both of these recommendations, however, has demonstrated ability to complete needed ADLs and mobility in her own ways at a mod I level despite higher fall risk and risk of skin breakdown with scoot transfers across uneven surfaces. Pt did display poor mental flexibility during ADL re-training, often stating "It's not like that at home, I know I can do that at home". Pt does display ability to complete all needed self-care tasks and mobility and mod I level per home set-up   Recommendation:  Patient will benefit from ongoing skilled OT services in home health setting to continue to advance functional skills in the area of BADL, iADL and Reduce care partner burden.  Equipment: Pt has all needed DME  Reasons for discharge: treatment goals met and discharge from hospital  Patient/family agrees with progress made and goals achieved: Yes  OT Discharge Precautions/Restrictions  Precautions Precautions: Fall Restrictions Weight Bearing Restrictions: No Vision Baseline Vision/History: No  visual deficits Vision Assessment?: No apparent visual deficits Perception  Perception: Within Functional Limits Praxis Praxis: Intact Cognition Overall Cognitive Status: Within Functional Limits for tasks assessed Arousal/Alertness: Awake/alert Orientation Level: Oriented X4 Focused Attention: Appears intact Sustained Attention: Appears intact Memory: Appears intact Awareness: Impaired Awareness Impairment: Anticipatory impairment Problem Solving: Impaired Problem Solving Impairment: Functional complex Safety/Judgment: Appears intact Sensation Sensation Light Touch: Appears Intact Proprioception: Appears Intact Coordination Gross Motor Movements are Fluid and Coordinated: No Fine Motor Movements are Fluid and Coordinated: Yes Coordination and Movement Description: impaired 2/2 generalized weakness LE>UE and L>R; dx of MS Motor  Motor Motor: Other (comment) Motor - Skilled Clinical Observations: impaired 2/2 generalized weakness due to MS exacerbation Trunk/Postural Assessment  Cervical Assessment Cervical Assessment: Exceptions to WFL(Forward head; extensive neck flexion) Thoracic Assessment Thoracic Assessment: (Extreme forward flexion with rounded shoulders; kyphotic) Lumbar Assessment Lumbar Assessment: Exceptions to WFL(Posterior pelvic tilt) Postural Control Postural Control: Deficits on evaluation  Balance Balance Balance Assessed: Yes Static Sitting Balance Static Sitting - Balance Support: Feet supported;No upper extremity supported Static Sitting - Level of Assistance: 6: Modified independent (Device/Increase time) Static Sitting - Comment/# of Minutes: Sitting EOB Dynamic Sitting Balance Dynamic Sitting - Balance Support: Feet supported;During functional activity Dynamic Sitting - Level of Assistance: 6: Modified independent (Device/Increase time) Extremity/Trunk Assessment RUE Assessment RUE Assessment: Within Functional Limits General Strength  Comments: generalized weakness LUE Assessment LUE Assessment: Within Functional Limits General Strength Comments: generalized weakness   Khriz Liddy L 12/15/2019, 7:25 AM

## 2019-12-16 NOTE — Progress Notes (Signed)
[name] PHYSICAL MEDICINE & REHABILITATION PROGRESS NOTE  Subjective/Complaints:   Discussed d/c plans- doesn't need to get Antivert since wasn't helpful- won't need Midodrine since hasn't used in last 7-8 days- BP has been good.   ROS: Denies CP, shortness of breath, nausea, vomiting, diarrhea.  Objective: Vital Signs: Blood pressure 113/65, pulse 74, temperature 97.9 F (36.6 C), resp. rate 19, SpO2 95 %. No results found. No results for input(s): WBC, HGB, HCT, PLT in the last 72 hours. No results for input(s): NA, K, CL, CO2, GLUCOSE, BUN, CREATININE, CALCIUM in the last 72 hours.  Physical Exam: BP 113/65 (BP Location: Left Arm)   Pulse 74   Temp 97.9 F (36.6 C)   Resp 19   SpO2 95%  Constitutional: No distress . Vital signs reviewed. Sitting up in bed; appears comfortable; watching TV,  NAD HENT: Normocephalic.  Atraumatic. Eyes: EOMI. No discharge. Cardiovascular: No JVD. Respiratory: Normal effort.  No stridor. GI: Non-distended. Skin: Warm and dry.  Intact. Psych: Normal mood.  Normal behavior. Musc: No edema in extremities.  No tenderness in extremities. Neurological: Alert  Motor: Bilateral upper extremities: 5 /5 proximal distal, unchanged  Right lower extremity: Hip flexion, knee extension 2/5, ankle dorsiflexion 0/5, wiggles toes (baseline, stable) Left lower extremity: Hip flexion, knee extension 1 +/5, ankle dorsiflexion 0/5, wiggles toes (baseline, stable)  Assessment/Plan: 1. Functional deficits secondary to multiple sclerosis? exacerbation which require 3+ hours per day of interdisciplinary therapy in a comprehensive inpatient rehab setting.  Physiatrist is providing close team supervision and 24 hour management of active medical problems listed below.  Physiatrist and rehab team continue to assess barriers to discharge/monitor patient progress toward functional and medical goals  Care Tool:  Bathing    Body parts bathed by patient: Right  arm, Left arm, Chest, Abdomen, Front perineal area, Buttocks, Right upper leg, Left upper leg, Left lower leg, Right lower leg, Face   Body parts bathed by helper: Buttocks     Bathing assist Assist Level: Set up assist     Upper Body Dressing/Undressing Upper body dressing   What is the patient wearing?: Pull over shirt    Upper body assist Assist Level: Independent    Lower Body Dressing/Undressing Lower body dressing      What is the patient wearing?: Pants, Incontinence brief     Lower body assist Assist for lower body dressing: Set up assist(assist for incontinence brief only)     Toileting Toileting    Toileting assist Assist for toileting: Independent with assistive device Assistive Device Comment: With clothing management completed at bed level (way she completed it PTA)   Transfers Chair/bed transfer  Transfers assist     Chair/bed transfer assist level: Set up assist     Locomotion Ambulation   Ambulation assist   Ambulation activity did not occur: N/A(did not ambulate prior to admission)      Max distance: 8   Walk 10 feet activity   Assist  Walk 10 feet activity did not occur: N/A        Walk 50 feet activity   Assist Walk 50 feet with 2 turns activity did not occur: N/A         Walk 150 feet activity   Assist Walk 150 feet activity did not occur: N/A         Walk 10 feet on uneven surface  activity   Assist Walk 10 feet on uneven surfaces activity did not occur: N/A  Wheelchair     Assist Will patient use wheelchair at discharge?: Yes Type of Wheelchair: Power Wheelchair activity did not occur: Safety/medical concerns  Wheelchair assist level: Independent Max wheelchair distance: 150'    Wheelchair 50 feet with 2 turns activity    Assist    Wheelchair 50 feet with 2 turns activity did not occur: Safety/medical concerns   Assist Level: Independent   Wheelchair 150 feet activity      Assist Wheelchair 150 feet activity did not occur: Safety/medical concerns   Assist Level: Independent      Medical Problem List and Plan: 1.  Decreased functional mobility with severe vertigo secondary to MS exacerbation.  Patient completed 3-day course of Solu-Medrol.  Patient will need to be established with neurology on outpatient as she has not received follow-up since 2014             Continue CIR 2.  Antithrombotics: -DVT/anticoagulation: Lovenox             -antiplatelet therapy: N/A 3. Pain Management: Tylenol as needed 4. Mood: Provide emotional support             -antipsychotic agents: N/A 5. Neuropsych: This patient is capable of making decisions on her own behalf. 6. Skin/Wound Care: Routine skin checks 7. Fluids/Electrolytes/Nutrition: Routine in and outs.   8.  Vertigo.  Antivert PRN, monitor for efficacy  Improved/Resolved 9.  Neurogenic bowel/ bladder.  Continue Mandelamine for now.  Adjust bowel program.    PVRs ordered, only performed x1 on 12/24, with residual of 150, continue to monitor  12/30- voiding but frequently- q1 on average 10.  Hypophosphatemia  Phosphorus 3.7 on 12/28 12.  Acute blood loss anemia: Resolved  Hemoglobin 12.9 on 12/23 13.  Labile/soft blood pressure  Monitor with activity, asymptomatic at present  Pt willing to take BP at home and take midodrine if BP <90/60 or lightheaded.    Discussed importance of oral hydration with water  Relatively soft, but asymptomatic on 12/20  12/30- BP better 11/68 14. Hyperkalemia: Resolved  Potassium 4.1 on 12/23 15. Mild leukocytosis: Resolved  WBC 9.0 on 12/23  16. Dispo  12/30- d/c tomorrow- will need ONE f/u with me.  1/1- F/U with Dr Berline Chough x1- d/c today  LOS: 14 days A FACE TO FACE EVALUATION WAS PERFORMED  [name] 12/16/2019, 9:53 AM

## 2019-12-16 NOTE — Progress Notes (Signed)
Social Work Discharge Note   The overall goal for the admission was met for:   Discharge location: Yes - home alone with intermittent assist of son  Length of Stay: Yes - 14 days  Discharge activity level: Yes - modified independent at w/c level  Home/community participation: Yes  Services provided included: MD, RD, PT, OT, RN, Pharmacy and SW  Financial Services: Medicare  Follow-up services arranged: Home Health: pt and son aware that HHPT, OT were recommended, however, pt declining services due to [place] exposure concerns.  Both have my contact information in case pt changes her mind once she is home., DME: 24" transfer board via [place] and Patient/Family has no preference for HH/DME agencies  Comments (or additional information):  Pt @ [phone] or son, Ladawn Boullion @ [phone]  Patient/Family verbalized understanding of follow-up arrangements: Yes  Individual responsible for coordination of the follow-up plan: pt  Confirmed correct DME delivered: Lennart Pall 12/16/2019    Kolbi Tofte, Lorre Nick

## 2019-12-16 NOTE — Progress Notes (Signed)
Pt DC summary completed 12/31. Son present, packed up belongings. Transferred pt to transport chair that son brought in. No questions concerns noted for RN. Pt/son transported to main lobby for DC.  Ross Ludwig, RN

## 2019-12-16 NOTE — Plan of Care (Signed)
  Problem: Consults Goal: RH GENERAL PATIENT EDUCATION Description: See Patient Education module for education specifics. Outcome: Completed/Met   Problem: RH BLADDER ELIMINATION Goal: RH STG MANAGE BLADDER WITH ASSISTANCE Description: STG Manage Bladder With Mod I Assistance Outcome: Completed/Met Goal: RH STG MANAGE BLADDER WITH MEDICATION WITH ASSISTANCE Description: STG Manage Bladder With Medication With Mod I Assistance. Outcome: Completed/Met   Problem: RH SKIN INTEGRITY Goal: RH STG MAINTAIN SKIN INTEGRITY WITH ASSISTANCE Description: STG Maintain Skin Integrity With Mod I Assistance. Outcome: Completed/Met

## 2019-12-16 NOTE — Plan of Care (Signed)
  Problem: Consults Goal: RH GENERAL PATIENT EDUCATION Description: See Patient Education module for education specifics. Outcome: Progressing   Problem: RH BLADDER ELIMINATION Goal: RH STG MANAGE BLADDER WITH ASSISTANCE Description: STG Manage Bladder With Mod I Assistance Outcome: Progressing Goal: RH STG MANAGE BLADDER WITH MEDICATION WITH ASSISTANCE Description: STG Manage Bladder With Medication With Mod I Assistance. Outcome: Progressing   Problem: RH SKIN INTEGRITY Goal: RH STG MAINTAIN SKIN INTEGRITY WITH ASSISTANCE Description: STG Maintain Skin Integrity With Mod I Assistance. Outcome: Progressing

## 2019-12-16 NOTE — Plan of Care (Signed)
  Problem: Consults Goal: RH GENERAL PATIENT EDUCATION Description: See Patient Education module for education specifics. 12/16/2019 0207 by Wende Crease, LPN Outcome: Progressing 12/16/2019 0202 by Wende Crease, LPN Outcome: Progressing   Problem: RH BLADDER ELIMINATION Goal: RH STG MANAGE BLADDER WITH ASSISTANCE Description: STG Manage Bladder With Mod I Assistance 12/16/2019 0207 by Wende Crease, LPN Outcome: Progressing 12/16/2019 0202 by Wende Crease, LPN Outcome: Progressing Goal: RH STG MANAGE BLADDER WITH MEDICATION WITH ASSISTANCE Description: STG Manage Bladder With Medication With Mod I Assistance. 12/16/2019 0207 by Wende Crease, LPN Outcome: Progressing 12/16/2019 0202 by Wende Crease, LPN Outcome: Progressing   Problem: RH SKIN INTEGRITY Goal: RH STG MAINTAIN SKIN INTEGRITY WITH ASSISTANCE Description: STG Maintain Skin Integrity With Mod I Assistance. 12/16/2019 0207 by Wende Crease, LPN Outcome: Progressing 12/16/2019 0202 by Wende Crease, LPN Outcome: Progressing

## 2019-12-19 ENCOUNTER — Telehealth: Payer: Self-pay | Admitting: Diagnostic Neuroimaging

## 2019-12-19 ENCOUNTER — Telehealth: Payer: Self-pay

## 2019-12-19 NOTE — Telephone Encounter (Signed)
Noted She did have extensive inpatient rehab---okay to just follow up with the rehab doctor for now

## 2019-12-19 NOTE — Telephone Encounter (Signed)
Transition Care Management Follow-up Telephone Call  Date of discharge and from where: 12/16/2019, [name]  How have you been since you were released from the hospital? Patient states that she is doing good and steady improving.   Any questions or concerns? No   Items Reviewed:  Did the pt receive and understand the discharge instructions provided? Yes   Medications obtained and verified? Yes   Any new allergies since your discharge? No   Dietary orders reviewed? Yes  Do you have support at home? Yes   Functional Questionnaire: (I = Independent and D = Dependent) ADLs: I  Bathing/Dressing- I  Meal Prep- I  Eating- I  Maintaining continence- I  Transferring/Ambulation- I  Managing Meds- I  Follow up appointments reviewed:   PCP Hospital f/u appt confirmed? No  Patient declined at this time. Patient states that she was told upon discharge to follow up with Dr. Genice Rouge with rehab. She will just follow up with her and call back if she feels she needs an appointment with her PCP.   Specialist Hospital f/u appt confirmed? Yes  Scheduled to see Dr. Genice Rouge.  Are transportation arrangements needed? No   If their condition worsens, is the pt aware to call PCP or go to the Emergency Dept.? Yes  Was the patient provided with contact information for the PCP's office or ED? Yes  Was to pt encouraged to call back with questions or concerns? Yes

## 2019-12-19 NOTE — Telephone Encounter (Signed)
I be happy to see her.  Since the MRI does show some brand-new lesions, we can try to get her in sometime in the next week.

## 2019-12-19 NOTE — Telephone Encounter (Signed)
We've received a new referral from the hospital on patient for MS exacerbation. She has seen Dr. Marjory Lies for MS in the past (2014), but would like to see Dr. Epimenio Foot. Would you both be ok with this?

## 2019-12-20 ENCOUNTER — Telehealth: Payer: Self-pay

## 2019-12-20 NOTE — Telephone Encounter (Signed)
  TRANSITIONAL CARE CALL  Appointment Date and Time: 12-23-2019 / 140pm With: Dr. Berline Chough    Questions for our staff to ask patients on Transitional care 48 hour phone call:   1. Are you/is patient experiencing any problems since coming home? NO     Are there any questions regarding any aspect of care? NO  2. Are there any questions regarding medications administration/dosing? NO     Are meds being taken as prescribed? NA  3. Have there been any falls? NO  4. Has Home Health been to the house and/or have they contacted you? DECLINED AT TIME OF DISCHARGE.     If not, have you tried to contact them? NA     Can we help you contact them? NA  5. Are bowels and bladder emptying properly? NO     Are there any unexpected incontinence issues? NA     If applicable, is patient following bowel/bladder programs?NA  6. Any fevers, problems with breathing, unexpected pain? NO  7. Are there any skin problems or new areas of breakdown? NO  8. Has the patient/family member arranged specialty MD follow up (ie cardiology/neurology/renal/surgical/etc)? YES     Can we help arrange? NA  9. Does the patient need any other services or support that we can help arrange? NO  10. Are caregivers following through as expected in assisting the patient? YES  11. Has the patient quit smoking, drinking alcohol, or using drugs as recommended? NA  [facility] [address] [phone]

## 2019-12-20 NOTE — Telephone Encounter (Signed)
Ok to switch. -VRP 

## 2019-12-23 ENCOUNTER — Other Ambulatory Visit: Payer: Self-pay

## 2019-12-23 ENCOUNTER — Encounter: Payer: Self-pay | Admitting: Physical Medicine and Rehabilitation

## 2019-12-23 ENCOUNTER — Encounter
Payer: Medicare Other | Attending: Physical Medicine and Rehabilitation | Admitting: Physical Medicine and Rehabilitation

## 2019-12-23 VITALS — Ht 63.0 in | Wt 113.0 lb

## 2019-12-23 DIAGNOSIS — G8220 Paraplegia, unspecified: Secondary | ICD-10-CM

## 2019-12-23 DIAGNOSIS — R030 Elevated blood-pressure reading, without diagnosis of hypertension: Secondary | ICD-10-CM | POA: Diagnosis not present

## 2019-12-23 DIAGNOSIS — H8113 Benign paroxysmal vertigo, bilateral: Secondary | ICD-10-CM | POA: Diagnosis not present

## 2019-12-23 NOTE — Patient Instructions (Signed)
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  1. Pt has no concerns and is doing well with transferring- no falls since d/c home- no additional vertigo-   2. Have pt call us/Dr Latash Nouri if any issues with vertigo in the future- [phone]

## 2019-12-23 NOTE — Progress Notes (Signed)
Subjective:    Patient ID: [name], female    DOB: [birth year], 74 y.o.   MRN: [id number]  HPI  Pt is a 74 yr old female with paraplegia due to questionable MS dx, with urinary frequency, no neurogenic bowel/bladder, no spasticity however came into [facility] with severe vertigo which was determined to be from Otoliths being out/BPV- and was fixed once came to [facility] rehab.  She has since returned home and is transferring well- doing well with her current set-up and has had no falls of any kind since discharge from [facility].  She has no pain complaints and vertigo has not returned.- Son wondered who she needed to call if dizziness did return ever in future- explained that would be me.    Pain Inventory Average Pain 0 Pain Right Now 0 My pain is na  In the last 24 hours, has pain interfered with the following? General activity 0 Relation with others 0 Enjoyment of life 0 What TIME of day is your pain at its worst? na Sleep (in general) Good  Pain is worse with: na Pain improves with: na Relief from Meds: na  Mobility do you drive?  no use a wheelchair transfers alone  Function disabled: date disabled .  Neuro/Psych bladder control problems trouble walking  Prior Studies Any changes since last visit?  no  Physicians involved in your care Any changes since last visit?  no   Family History  Problem Relation Age of Onset  . Stroke Mother   . Neuropathy Brother   . Stroke Father   . Hypertension Neg Hx   . Diabetes Neg Hx   . Depression Neg Hx   . Alcohol abuse Neg Hx   . Drug abuse Neg Hx    Social History   Socioeconomic History  . Marital status: Widowed    Spouse name: Not on file  . Number of children: 2  . Years of education: HS  . Highest education level: Not on file  Occupational History  . Occupation: Secretarial work---disabled  Tobacco Use  . Smoking status: Never Smoker  . Smokeless tobacco: Never Used  Substance and Sexual Activity  .  Alcohol use: No  . Drug use: No  . Sexual activity: Not on file  Other Topics Concern  . Not on file  Social History Narrative   Widowed 1991.  Lives alone.    Played piano.          No living will   Son [name] should make decisions for her prn   Would accept resuscitation attempts   No tube feeds if cognitively unaware   Social Determinants of Health   Financial Resource Strain:   . Difficulty of Paying Living Expenses: Not on file  Food Insecurity:   . Worried About Programme researcher, broadcasting/film/video in the Last Year: Not on file  . Ran Out of Food in the Last Year: Not on file  Transportation Needs:   . Lack of Transportation (Medical): Not on file  . Lack of Transportation (Non-Medical): Not on file  Physical Activity:   . Days of Exercise per Week: Not on file  . Minutes of Exercise per Session: Not on file  Stress:   . Feeling of Stress : Not on file  Social Connections:   . Frequency of Communication with Friends and Family: Not on file  . Frequency of Social Gatherings with Friends and Family: Not on file  . Attends Religious Services: Not on file  .  Active Member of Clubs or Organizations: Not on file  . Attends Archivist Meetings: Not on file  . Marital Status: Not on file   Past Surgical History:  Procedure Laterality Date  . LUMBAR DISC SURGERY  04/2007   L5, S1- [facility]  . SEPTOPLASTY  1983   deviated septum  . TONSILLECTOMY AND ADENOIDECTOMY  1960  . TOTAL ABDOMINAL HYSTERECTOMY W/ BILATERAL SALPINGOOPHORECTOMY  1982   for endometriosis   Past Medical History:  Diagnosis Date  . Ankle fracture, left 07/2006  . Ankle fracture, right 10/30/06   [facility]  . Compression fracture 2011   Dr. Mardelle Matte   . Disc herniation    L5, S1  . MS (multiple sclerosis) ([place])    evaluated by Dr. Erling Cruz  . MVP (mitral valve prolapse) 1979   ECHO. Trace MR, trace TR, trace PR 07/17/03  . Nephrolithiasis   . Osteoporosis   . Rib fractures 1998, [id number]   2  . Toe fracture 1998    5   There were no vitals taken for this visit.  Opioid Risk Score:   Fall Risk Score:  `1  Depression screen PHQ 2/9  Depression screen [facility] 2/9 05/17/2018 07/02/2017 09/30/2013  Decreased Interest 0 0 0  Down, Depressed, Hopeless 0 0 0  PHQ - 2 Score 0 0 0     Review of Systems  Constitutional: Negative.   HENT: Negative.   Eyes: Negative.   Respiratory: Negative.   Cardiovascular: Negative.   Gastrointestinal: Negative.   Endocrine: Negative.   Genitourinary: Positive for difficulty urinating.  Musculoskeletal: Positive for gait problem.  Skin: Negative.   Allergic/Immunologic: Negative.   Hematological: Negative.   Psychiatric/Behavioral: Negative.   All other systems reviewed and are negative.      Objective:   Physical Exam   Per phone call     Assessment & Plan:   Pt is a 74 yr old female with paraplegia due to questionable MS dx, with urinary frequency, no neurogenic bowel/bladder, no spasticity however came into [facility] with severe vertigo which was determined to be from Otoliths being out/BPV- and was fixed once came to [facility] rehab.  1. Pt has no concerns and is doing well with transferring- no falls since d/c home- no additional vertigo-   2. Have pt call us/Dr Lovorn if any issues with vertigo in the future- [phone]

## 2020-01-17 ENCOUNTER — Telehealth: Payer: Self-pay | Admitting: Internal Medicine

## 2020-01-17 NOTE — Telephone Encounter (Signed)
Patient called the better business bureau to ask them about medical alerts (like Life Alert).  [employer] told her to call her doctor to find out what he recommends to be the best medical alert.

## 2020-01-19 NOTE — Telephone Encounter (Signed)
I really don't have a preference---it would be better if she gets one that also monitors outside of her home though

## 2020-02-10 ENCOUNTER — Telehealth: Payer: Self-pay

## 2020-02-10 NOTE — Telephone Encounter (Signed)
Spoke to pt on [employer]. She is concerned with getting the Covid vaccine with her severe reaction and long term health issues she has had from the Swine Flu vaccine she received in 1975. That is why she never gets vaccines. Wants Dr Karle Starch opinion as to whether she should get it.

## 2020-02-11 NOTE — Telephone Encounter (Signed)
I think she should get it. Tens of millions have gotten it in this country with very little severe reactions. I am convinced that her risks from COVID are far higher than from the vaccine

## 2020-02-13 NOTE — Telephone Encounter (Signed)
Spoke to pt. She said she called the CDC and they recommended she speak to an immunologist. I told her that would be up to her if she wanted to move forward with an immunologist. I advised her that Dr Alphonsus Sias is constantly looking at studies as well as vaccinating all of [name] residents and the Independent Living people, as well. The side effects have been relatively mild compared to what could happen if they had Covid. She will think about it.

## 2020-03-07 ENCOUNTER — Encounter: Payer: Self-pay | Admitting: Internal Medicine

## 2020-03-07 ENCOUNTER — Other Ambulatory Visit: Payer: Self-pay

## 2020-03-07 ENCOUNTER — Ambulatory Visit (INDEPENDENT_AMBULATORY_CARE_PROVIDER_SITE_OTHER): Payer: Medicare Other | Admitting: Internal Medicine

## 2020-03-07 DIAGNOSIS — R040 Epistaxis: Secondary | ICD-10-CM | POA: Diagnosis not present

## 2020-03-07 DIAGNOSIS — R519 Headache, unspecified: Secondary | ICD-10-CM

## 2020-03-07 NOTE — Patient Instructions (Signed)
General Headache Without Cause A headache is pain or discomfort that is felt around the head or neck area. There are many causes and types of headaches. In some cases, the cause may not be found. Follow these instructions at home: Watch your condition for any changes. Let your doctor know about them. Take these steps to help with your condition: Managing pain      Take over-the-counter and prescription medicines only as told by your doctor.  Lie down in a dark, quiet room when you have a headache.  If told, put ice on your head and neck area: ? Put ice in a plastic bag. ? Place a towel between your skin and the bag. ? Leave the ice on for 20 minutes, 2-3 times per day.  If told, put heat on the affected area. Use the heat source that your doctor recommends, such as a moist heat pack or a heating pad. ? Place a towel between your skin and the heat source. ? Leave the heat on for 20-30 minutes. ? Remove the heat if your skin turns bright red. This is very important if you are unable to feel pain, heat, or cold. You may have a greater risk of getting burned.  Keep lights dim if bright lights bother you or make your headaches worse. Eating and drinking  Eat meals on a regular schedule.  If you drink alcohol: ? Limit how much you use to:  0-1 drink a day for women.  0-2 drinks a day for men. ? Be aware of how much alcohol is in your drink. In the [place] one drink equals one 12 oz bottle of beer (355 mL), one 5 oz glass of wine (148 mL), or one 1 oz glass of hard liquor (44 mL).  Stop drinking caffeine, or reduce how much caffeine you drink. General instructions   Keep a journal to find out if certain things bring on headaches. For example, write down: ? What you eat and drink. ? How much sleep you get. ? Any change to your diet or medicines.  Get a massage or try other ways to relax.  Limit stress.  Sit up straight. Do not tighten (tense) your muscles.  Do not use any  products that contain nicotine or tobacco. This includes cigarettes, e-cigarettes, and chewing tobacco. If you need help quitting, ask your doctor.  Exercise regularly as told by your doctor.  Get enough sleep. This often means 7-9 hours of sleep each night.  Keep all follow-up visits as told by your doctor. This is important. Contact a doctor if:  Your symptoms are not helped by medicine.  You have a headache that feels different than the other headaches.  You feel sick to your stomach (nauseous) or you throw up (vomit).  You have a fever. Get help right away if:  Your headache gets very bad quickly.  Your headache gets worse after a lot of physical activity.  You keep throwing up.  You have a stiff neck.  You have trouble seeing.  You have trouble speaking.  You have pain in the eye or ear.  Your muscles are weak or you lose muscle control.  You lose your balance or have trouble walking.  You feel like you will pass out (faint) or you pass out.  You are mixed up (confused).  You have a seizure. Summary  A headache is pain or discomfort that is felt around the head or neck area.  There are many causes and   types of headaches. In some cases, the cause may not be found.  Keep a journal to help find out what causes your headaches. Watch your condition for any changes. Let your doctor know about them.  Contact a doctor if you have a headache that is different from usual, or if your headache is not helped by medicine.  Get help right away if your headache gets very bad, you throw up, you have trouble seeing, you lose your balance, or you have a seizure. This information is not intended to replace advice given to you by your health care provider. Make sure you discuss any questions you have with your health care provider. Document Revised: 06/21/2018 Document Reviewed: 06/21/2018 Elsevier Patient Education  2020 Elsevier Inc.  

## 2020-03-07 NOTE — Progress Notes (Signed)
Virtual Visit via Video Note  I connected with [name] on 03/07/20 at  3:45 PM EDT by a video enabled telemedicine application and verified that I am speaking with the correct person using two identifiers.  Location: Patient: Home Provider: Office   I discussed the limitations of evaluation and management by telemedicine and the availability of in person appointments. The patient expressed understanding and agreed to proceed.  History of Present Illness:  Pt reports headaches and nosebleeds. This started 3 days ago but has not resolved. The headaches are located in the top of her head. She describes the pain as throbbing. She denies visual changes, sensitivity to light or sound, nausea or vomiting. She has chronic vertigo, recent ER visit 11/2019 for the same. She reports her nose was bleeding the last 2 days but has since resolved. She denies runny nose, nasal congestion, ear pain, sore throat, loss of taste/smell, cough or SOB. She denies fever chills or body aches. She has no history of HTN, and no recent elevated blood pressure. She has tried Aspirin OTC with some relief.   Past Medical History:  Diagnosis Date  . Ankle fracture, left 07/2006  . Ankle fracture, right 10/30/06   [facility]  . Compression fracture 2011   Dr. Dion Saucier   . Disc herniation    L5, S1  . MS (multiple sclerosis) ([facility])    evaluated by Dr. Sandria Manly  . MVP (mitral valve prolapse) 1979   ECHO. Trace MR, trace TR, trace PR 07/17/03  . Nephrolithiasis   . Osteoporosis   . Rib fractures 1998, [id number]   2  . Toe fracture 1998   5    Current Outpatient Medications  Medication Sig Dispense Refill  . methenamine (HIPREX) 1 g tablet Take 1 tablet (1 g total) by mouth daily. 30 tablet 0   No current facility-administered medications for this visit.    Allergies  Allergen Reactions  . Procaine Hcl     REACTION: UNSPECIFIED    Family History  Problem Relation Age of Onset  . Stroke Mother   . Neuropathy  Brother   . Stroke Father   . Hypertension Neg Hx   . Diabetes Neg Hx   . Depression Neg Hx   . Alcohol abuse Neg Hx   . Drug abuse Neg Hx     Social History   Socioeconomic History  . Marital status: Widowed    Spouse name: Not on file  . Number of children: 2  . Years of education: HS  . Highest education level: Not on file  Occupational History  . Occupation: Secretarial work---disabled  Tobacco Use  . Smoking status: Never Smoker  . Smokeless tobacco: Never Used  Substance and Sexual Activity  . Alcohol use: No  . Drug use: No  . Sexual activity: Not on file  Other Topics Concern  . Not on file  Social History Narrative   Widowed 1991.  Lives alone.    Played piano.          No living will   Son [name] should make decisions for her prn   Would accept resuscitation attempts   No tube feeds if cognitively unaware   Social Determinants of Health   Financial Resource Strain:   . Difficulty of Paying Living Expenses:   Food Insecurity:   . Worried About Programme researcher, broadcasting/film/video in the Last Year:   . Barista in the Last Year:   Transportation Needs:   .  Lack of Transportation (Medical):   Marland Kitchen Lack of Transportation (Non-Medical):   Physical Activity:   . Days of Exercise per Week:   . Minutes of Exercise per Session:   Stress:   . Feeling of Stress :   Social Connections:   . Frequency of Communication with Friends and Family:   . Frequency of Social Gatherings with Friends and Family:   . Attends Religious Services:   . Active Member of Clubs or Organizations:   . Attends Archivist Meetings:   Marland Kitchen Marital Status:   Intimate Partner Violence:   . Fear of Current or Ex-Partner:   . Emotionally Abused:   Marland Kitchen Physically Abused:   . Sexually Abused:      Constitutional: Pt reports headache. Denies fever, malaise, fatigue, or abrupt weight changes.  HEENT: Pt reports bloody nose. Denies eye pain, eye redness, ear pain, ringing in the ears, wax  buildup, runny nose, nasal congestion, or sore throat. Respiratory: Denies difficulty breathing, shortness of breath, cough or sputum production.   Cardiovascular: Denies chest pain, chest tightnesst.  Neurological: Pt reports chronic dizziness. Denies  difficulty with memory, difficulty with speech or problems with balance and coordination.    No other specific complaints in a complete review of systems (except as listed in HPI above).   Observations/Objective:  Wt Readings from Last 3 Encounters:  12/23/19 113 lb (51.3 kg)  11/27/19 113 lb 1.5 oz (51.3 kg)  07/02/17 113 lb (51.3 kg)    General: Appears her stated age, well developed, well nourished in NAD. HEENT: Head: normal shape and size;  Pulmonary/Chest: Normal effort. No respiratory distress.  Neurological: Alert and oriented.    BMET    Component Value Date/Time   NA 139 12/07/2019 0657   K 4.1 12/07/2019 0657   CL 102 12/07/2019 0657   CO2 30 12/07/2019 0657   GLUCOSE 89 12/07/2019 0657   BUN 14 12/07/2019 0657   CREATININE 0.66 12/07/2019 0657   CREATININE 0.65 08/26/2016 1642   CALCIUM 8.9 12/07/2019 0657   GFRNONAA >60 12/07/2019 0657   GFRAA >60 12/07/2019 0657    Lipid Panel  No results found for: CHOL, TRIG, HDL, CHOLHDL, VLDL, LDLCALC  CBC    Component Value Date/Time   WBC 9.0 12/07/2019 0657   RBC 4.11 12/07/2019 0657   HGB 12.9 12/07/2019 0657   HCT 39.9 12/07/2019 0657   PLT 381 12/07/2019 0657   MCV 97.1 12/07/2019 0657   MCH 31.4 12/07/2019 0657   MCHC 32.3 12/07/2019 0657   RDW 12.7 12/07/2019 0657   LYMPHSABS 3.0 12/07/2019 0657   MONOABS 1.0 12/07/2019 0657   EOSABS 0.2 12/07/2019 0657   BASOSABS 0.0 12/07/2019 0657    Hgb A1C No results found for: HGBA1C      Assessment and Plan:  Acute Headache, Bloody Nose:  Resolved Can try Nasal Saline 3 x day prn She is unable to check her BP at home  Advised her if symptoms persist or worsen, she should follow up with PCP in  office  Follow Up Instructions:    I discussed the assessment and treatment plan with the patient. The patient was provided an opportunity to ask questions and all were answered. The patient agreed with the plan and demonstrated an understanding of the instructions.   The patient was advised to call back or seek an in-person evaluation if the symptoms worsen or if the condition fails to improve as anticipated.  Webb Silversmith, NP

## 2020-03-12 ENCOUNTER — Encounter: Payer: Self-pay | Admitting: Internal Medicine

## 2020-03-12 ENCOUNTER — Ambulatory Visit (INDEPENDENT_AMBULATORY_CARE_PROVIDER_SITE_OTHER): Payer: Medicare Other | Admitting: Internal Medicine

## 2020-03-12 ENCOUNTER — Other Ambulatory Visit: Payer: Self-pay

## 2020-03-12 VITALS — BP 118/80 | HR 63 | Temp 97.2°F

## 2020-03-12 DIAGNOSIS — R42 Dizziness and giddiness: Secondary | ICD-10-CM | POA: Diagnosis not present

## 2020-03-12 DIAGNOSIS — R040 Epistaxis: Secondary | ICD-10-CM | POA: Diagnosis not present

## 2020-03-12 DIAGNOSIS — G35 Multiple sclerosis: Secondary | ICD-10-CM | POA: Diagnosis not present

## 2020-03-12 DIAGNOSIS — G8220 Paraplegia, unspecified: Secondary | ICD-10-CM | POA: Diagnosis not present

## 2020-03-12 NOTE — Assessment & Plan Note (Signed)
Very slight Probably from dry heat She is trying nasal saline Would send to ENT if worsens

## 2020-03-12 NOTE — Progress Notes (Signed)
Subjective:    Patient ID: [name], female    DOB: Dec 27, 1945, 74 y.o.   MRN: [id number]  HPI Here with son due to dizziness and nosebleed This visit occurred during the SARS-CoV-2 public health emergency.  Safety protocols were in place, including screening questions prior to the visit, additional usage of staff PPE, and extensive cleaning of exam room while observing appropriate contact time as indicated for disinfecting solutions.   Reviewed hospitalization in December Had vertigo then Diagnosis was unclear--did have 2 small spots on MRI consistent with demyelination Got IV solumedrol  Went to rehab  Now seems to be feeling like she is falling backward No falls though--in wheelchair May fall back if in bed  Still independent at home  Wheelchair for paraplegia---able to transfer herself, etc  Has headache on the top of her head at times Last week Monday and Tuesday--not since then  Current Outpatient Medications on File Prior to Visit  Medication Sig Dispense Refill  . methenamine (HIPREX) 1 g tablet Take 1 tablet (1 g total) by mouth daily. (Patient taking differently: Take 1 g by mouth every other day. ) 30 tablet 0   No current facility-administered medications on file prior to visit.    Allergies  Allergen Reactions  . Procaine Hcl     REACTION: UNSPECIFIED    Past Medical History:  Diagnosis Date  . Ankle fracture, left 07/2006  . Ankle fracture, right 10/30/06   [facility]  . Compression fracture 2011   Dr. Mardelle Matte   . Disc herniation    L5, S1  . MS (multiple sclerosis) ([place])    evaluated by Dr. Erling Cruz  . MVP (mitral valve prolapse) 1979   ECHO. Trace MR, trace TR, trace PR 07/17/03  . Nephrolithiasis   . Osteoporosis   . Rib fractures 1998, [id number]   2  . Toe fracture 1998   5    Past Surgical History:  Procedure Laterality Date  . LUMBAR DISC SURGERY  04/2007   L5, S1- [facility]  . SEPTOPLASTY  1983   deviated septum  . TONSILLECTOMY AND  ADENOIDECTOMY  1960  . TOTAL ABDOMINAL HYSTERECTOMY W/ BILATERAL SALPINGOOPHORECTOMY  1982   for endometriosis    Family History  Problem Relation Age of Onset  . Stroke Mother   . Neuropathy Brother   . Stroke Father   . Hypertension Neg Hx   . Diabetes Neg Hx   . Depression Neg Hx   . Alcohol abuse Neg Hx   . Drug abuse Neg Hx     Social History   Socioeconomic History  . Marital status: Widowed    Spouse name: Not on file  . Number of children: 2  . Years of education: HS  . Highest education level: Not on file  Occupational History  . Occupation: Secretarial work---disabled  Tobacco Use  . Smoking status: Never Smoker  . Smokeless tobacco: Never Used  Substance and Sexual Activity  . Alcohol use: No  . Drug use: No  . Sexual activity: Not on file  Other Topics Concern  . Not on file  Social History Narrative   Widowed 1991.  Lives alone.    Played piano.          No living will   Son Shanon Brow should make decisions for her prn   Would accept resuscitation attempts   No tube feeds if cognitively unaware   Social Determinants of Health   Financial Resource Strain:   . Difficulty  of Paying Living Expenses:   Food Insecurity:   . Worried About Programme researcher, broadcasting/film/video in the Last Year:   . Barista in the Last Year:   Transportation Needs:   . Freight forwarder (Medical):   Marland Kitchen Lack of Transportation (Non-Medical):   Physical Activity:   . Days of Exercise per Week:   . Minutes of Exercise per Session:   Stress:   . Feeling of Stress :   Social Connections:   . Frequency of Communication with Friends and Family:   . Frequency of Social Gatherings with Friends and Family:   . Attends Religious Services:   . Active Member of Clubs or Organizations:   . Attends Banker Meetings:   Marland Kitchen Marital Status:   Intimate Partner Violence:   . Fear of Current or Ex-Partner:   . Emotionally Abused:   Marland Kitchen Physically Abused:   . Sexually Abused:     Review of Systems Still has sense like her nose is going to run--then sees some blood on the tissue No fever No N/V Eating okay Still feels her B12 is low--past shots. B12 was low normal in December    Objective:   Physical Exam  HENT:  No clear bleeding source in nose TMs and canals are normal  Eyes: EOM are normal.  No nystagmus  Neurological:  Paraplegia Symmetric strength in arms  Psychiatric: She has a normal mood and affect. Her behavior is normal.           Assessment & Plan:

## 2020-03-12 NOTE — Assessment & Plan Note (Signed)
Seems to be central but responded to canalith repositioning procedure Will set up ENT

## 2020-03-12 NOTE — Assessment & Plan Note (Signed)
No change in function Still able to manage on her own at home--with son's support

## 2020-03-12 NOTE — Assessment & Plan Note (Signed)
Ongoing vertigo that seems to be central New MRI lesions She seems to have some denial about MS being worse again Wondered about seeing ENT due to vertigo--but I think that is central Would send to [facility] ENT if Dr Epimenio Foot thinks she needs to see ENT

## 2020-03-13 ENCOUNTER — Ambulatory Visit: Payer: Medicare Other | Admitting: Internal Medicine

## 2020-04-03 ENCOUNTER — Telehealth: Payer: Self-pay | Admitting: Neurology

## 2020-04-03 ENCOUNTER — Ambulatory Visit (INDEPENDENT_AMBULATORY_CARE_PROVIDER_SITE_OTHER): Payer: Medicare Other | Admitting: Neurology

## 2020-04-03 ENCOUNTER — Encounter: Payer: Self-pay | Admitting: Neurology

## 2020-04-03 ENCOUNTER — Other Ambulatory Visit: Payer: Self-pay

## 2020-04-03 VITALS — BP 110/70 | HR 68 | Temp 97.0°F | Ht 63.0 in

## 2020-04-03 DIAGNOSIS — H814 Vertigo of central origin: Secondary | ICD-10-CM

## 2020-04-03 DIAGNOSIS — R269 Unspecified abnormalities of gait and mobility: Secondary | ICD-10-CM

## 2020-04-03 DIAGNOSIS — G35 Multiple sclerosis: Secondary | ICD-10-CM | POA: Diagnosis not present

## 2020-04-03 DIAGNOSIS — G8220 Paraplegia, unspecified: Secondary | ICD-10-CM

## 2020-04-03 DIAGNOSIS — M4802 Spinal stenosis, cervical region: Secondary | ICD-10-CM

## 2020-04-03 DIAGNOSIS — N319 Neuromuscular dysfunction of bladder, unspecified: Secondary | ICD-10-CM

## 2020-04-03 DIAGNOSIS — H518 Other specified disorders of binocular movement: Secondary | ICD-10-CM

## 2020-04-03 DIAGNOSIS — H539 Unspecified visual disturbance: Secondary | ICD-10-CM

## 2020-04-03 NOTE — Telephone Encounter (Signed)
Medicare/[place] of [place] order sent to GI. No auth they will reach out to the patient to schedule.  

## 2020-04-03 NOTE — Progress Notes (Signed)
[facility]  PATIENT: [name] DOB: 12-04-1946  REFERRING DOCTOR OR PCP: Kimberly Abide MD SOURCE: Patient, notes from primary care, notes from neurology 2014 and recent hospital admission, imaging and laboratory reports, MRI images personally reviewed.  _________________________________   HISTORICAL  CHIEF COMPLAINT:  Chief Complaint  Patient presents with  . New Patient (Initial Visit)    RM 48 with son, [name] (temp: 97.3) Internal referral from Kimberly Abide, MD for MS,dizziness. Last saw Dr. Marjory Collins 08/10/2013. Dx with MS 1982, she used to follow Dr. Sandria Collins before he retired. He was not sure if she had MS or not. She received swine flu shot in 1970's. She had SE from this and thinks her sx may have been related. She saw Dr. Marjory Collins once but did not go back to see him. She does not like going to the doctors.    . Dizziness    This just started in December 2020. She had headaches as well. Denies any injuries or falls. Dizziness was most severe in December 2020 and she ended up at the hospital. It has improved since. She feels like she is falling backwards when she does not have a back to the chair.   . Gait Problem    In WC, unable to stand. No falls in the last year.     HISTORY OF PRESENT ILLNESS:  I had the pleasure of seeing your patient, Kimberly. Lajean Collins, at the MS center at [facility] for neurologic consultation regarding her history of MS and recent MRI showing enhancing lesions  She is a 74 year old woman who was diagnosed with MS in 1982 after presenting with right arm numbness.   In retrospect, she reports that after the swine flu vaccination in the 1970's she had trouble with her walking.   She had a myelogram and reports her gait was worse afterwards.  She needed to hold on for balance since 1982.   She started to use a walker in 1995 part time and most of the time around 2005.   She has used a wheelchair the past 10 years.  She is able to transfer from the wheelchair to the toilet but cannot bear much weight.  She noted no exacerbation.   She was seeing Dr. Sandria Collins before 2014 and saw Dr. Marjory Collins once in 2014.Marland Kitchen    She was never on a DMT.      She had fractures of the right ankle in 2006, left ankle in 2007 and thoracic spine in 2011, She had vertebroplasty or kyphoplasty.       She notes vertigo that started November or December 2020.  She has trouble with supporting her trunk due to feeling like she is being pushed backwards.    She sometimes has a spinning sensation.    She had nausea and once had vomiting.     When vertigo worsened she went to the ED.  Her son notes that she had nystagmus when the vertigo spells occured.  The some of the episodes occurred associated with position change.   MRI showed white matter changes c/w MS and she had 2 small hemispheric enhancing lesion.    She was admitted and received IV Solumedrol.   She also received PT and vestibular therapy.  She notes that after one of the sessions of vestibular therapy that the vertigo markedly improved.  Currently, she reports weakness in both legs.  Her arms are strong.  She denies any clumsiness or numbness in the arms.  She does not note numbness in the legs.  She has urinary urgency.    She denies diplopia but has a dysconjugate gaze.   She notes blurry vision.  She has not seen ophthalmology in more than a decade.  MRI of the brain 11/26/2019 showed multiple T2/FLAIR hyperintense foci in the periventricular, juxtacortical and deep white matter.  The infratentorial white matter appears normal.  There were 2 small enhancing lesions, both in the periventricular white matter (these small lesions would not be expected to cause vertigo or any other symptoms.).  I also reviewed the MRI of the cervical spine from May 07, 2016.  It shows multiple single and confluent T2 hyperintense foci within the spinal cord.  Some of the foci are peripherally located,  consistent with multiple sclerosis lesions.  REVIEW OF SYSTEMS: Constitutional: No fevers, chills, sweats, or change in appetite Eyes: She notes visual changes.  She has a dysconjugate gaze but denies diplopia Ear, nose and throat: No hearing loss, ear pain, nasal congestion, sore throat Cardiovascular: No chest pain, palpitations Respiratory: No shortness of breath at rest or with exertion.   No wheezes GastrointestinaI: No nausea, vomiting, diarrhea, abdominal pain, fecal incontinence Genitourinary:Urinary urgency. Musculoskeletal: No neck pain, back pain Integumentary: No rash, pruritus, skin lesions Neurological: as above Psychiatric: No depression at this time.  No anxiety Endocrine: No palpitations, diaphoresis, change in appetite, change in weigh or increased thirst Hematologic/Lymphatic: No anemia, purpura, petechiae. Allergic/Immunologic: No itchy/runny eyes, nasal congestion, recent allergic reactions, rashes  ALLERGIES: Allergies  Allergen Reactions  . Procaine Hcl     REACTION: UNSPECIFIED    HOME MEDICATIONS:  Current Outpatient Medications:  .  methenamine (HIPREX) 1 g tablet, Take 1 tablet (1 g total) by mouth daily. (Patient taking differently: Take 1 g by mouth every other day. ), Disp: 30 tablet, Rfl: 0  PAST MEDICAL HISTORY: Past Medical History:  Diagnosis Date  . Ankle fracture, left 07/2006  . Ankle fracture, right 10/30/06   [facility]  . Compression fracture 2011   Dr. Dion Saucier   . Disc herniation    L5, S1  . MS (multiple sclerosis) (HCC)    evaluated by Dr. Sandria Collins  . MVP (mitral valve prolapse) 1979   ECHO. Trace MR, trace TR, trace PR 07/17/03  . Nephrolithiasis   . Osteoporosis   . Rib fractures 1998, [id number]   2  . Toe fracture 1998   5    PAST SURGICAL HISTORY: Past Surgical History:  Procedure Laterality Date  . LUMBAR DISC SURGERY  04/2007   L5, S1- [facility]  . SEPTOPLASTY  1983   deviated septum  . TONSILLECTOMY AND ADENOIDECTOMY  1960  .  TOTAL ABDOMINAL HYSTERECTOMY W/ BILATERAL SALPINGOOPHORECTOMY  1982   for endometriosis    FAMILY HISTORY: Family History  Problem Relation Age of Onset  . Stroke Mother   . Neuropathy Brother   . Stroke Father   . Hypertension Neg Hx   . Diabetes Neg Hx   . Depression Neg Hx   . Alcohol abuse Neg Hx   . Drug abuse Neg Hx     SOCIAL HISTORY:  Social History   Socioeconomic History  . Marital status: Widowed    Spouse name: Not on file  . Number of children: 2  . Years of education: HS  . Highest education level: Not on file  Occupational History  . Occupation: Secretarial work---disabled  Tobacco Use  . Smoking status: Never Smoker  . Smokeless tobacco: Never Used  Substance and Sexual Activity  . Alcohol use: No  . Drug use: No  . Sexual activity: Not on file  Other Topics Concern  . Not on file  Social History Narrative   Widowed 1991.  Lives alone.    Played piano.       No living will   Son Shanon Brow should make decisions for her prn   Would accept resuscitation attempts   No tube feeds if cognitively unaware      Caffeine XBD:ZHGDJM decaf   Right handed    Social Determinants of Health   Financial Resource Strain:   . Difficulty of Paying Living Expenses:   Food Insecurity:   . Worried About Charity fundraiser in the Last Year:   . Arboriculturist in the Last Year:   Transportation Needs:   . Film/video editor (Medical):   Marland Kitchen Lack of Transportation (Non-Medical):   Physical Activity:   . Days of Exercise per Week:   . Minutes of Exercise per Session:   Stress:   . Feeling of Stress :   Social Connections:   . Frequency of Communication with Friends and Family:   . Frequency of Social Gatherings with Friends and Family:   . Attends Religious Services:   . Active Member of Clubs or Organizations:   . Attends Archivist Meetings:   Marland Kitchen Marital Status:   Intimate Partner Violence:   . Fear of Current or Ex-Partner:   . Emotionally  Abused:   Marland Kitchen Physically Abused:   . Sexually Abused:      PHYSICAL EXAM  Vitals:   04/03/20 1317  BP: 110/70  Pulse: 68  Temp: (!) 97 F (36.1 C)  SpO2: 96%  Height: 5\' 3"  (1.6 m)    Body mass index is 20.02 kg/m.   General: The patient is well-developed and well-nourished and in no acute distress.  She is in a wheelchair.  HEENT:  Head is [place]/AT.  Sclera are anicteric.     Neck: No carotid bruits are noted.  The neck is nontender.  Cardiovascular: The heart has a regular rate and rhythm with a normal S1 and S2. There were no murmurs, gallops or rubs.    Skin: Extremities are without rash or edema.  Musculoskeletal:  Back is nontender  Neurologic Exam  Mental status: The patient is alert and oriented x 3 at the time of the examination. The patient has apparent normal recent and remote memory, with an apparently normal attention span and concentration ability.   Speech is normal.  Cranial nerves: She has a disconjugate gaze but no nystagmus.  Color vision is reduced OS.  Pupils are equal, round, and reactive to light and accomodation.  Visual fields are full.  Facial symmetry is present. There is good facial sensation to soft touch bilaterally.Facial strength is normal.  Trapezius and sternocleidomastoid strength is normal. No dysarthria is noted.  The tongue is midline, and the patient has symmetric elevation of the soft palate. No obvious hearing deficits are noted.  Motor:  Muscle bulk is normal.   Tone is normal. Strength is  5 / 5 in the arms.  Strength is 2/5 with hip extension, 2+/5 with lower leg extension and 2/5 in the feet and ankles.  Sensory: Sensory testing is intact to pinprick, soft touch and vibration sensation in arms and mildly reduced vibration and touch in right leg.    Coordination: Cerebellar testing reveals good finger-nose-finger.  She is unable to  do heel-to-shin.  Gait and station:    She cannot stand or walk.   Reflexes: Deep tendon reflexes  are symmetric and normal in arms, trace at the knees and absent at the ankles.   Plantar responses are flexor.    DIAGNOSTIC DATA (LABS, IMAGING, TESTING) - I reviewed patient records, labs, notes, testing and imaging myself where available.  Lab Results  Component Value Date   WBC 9.0 12/07/2019   HGB 12.9 12/07/2019   HCT 39.9 12/07/2019   MCV 97.1 12/07/2019   PLT 381 12/07/2019      Component Value Date/Time   NA 139 12/07/2019 0657   K 4.1 12/07/2019 0657   CL 102 12/07/2019 0657   CO2 30 12/07/2019 0657   GLUCOSE 89 12/07/2019 0657   BUN 14 12/07/2019 0657   CREATININE 0.66 12/07/2019 0657   CREATININE 0.65 08/26/2016 1642   CALCIUM 8.9 12/07/2019 0657   PROT 5.2 (L) 12/05/2019 0544   ALBUMIN 2.6 (L) 12/05/2019 0544   AST 60 (H) 12/05/2019 0544   ALT 107 (H) 12/05/2019 0544   ALKPHOS 54 12/05/2019 0544   BILITOT 0.5 12/05/2019 0544   GFRNONAA >60 12/07/2019 0657   GFRAA >60 12/07/2019 0657    Lab Results  Component Value Date   VITAMINB12 278 11/28/2019   Lab Results  Component Value Date   TSH 1.83 10/18/2012       ASSESSMENT AND PLAN  Neurogenic bladder  Multiple sclerosis (HCC) - Plan: MR BRAIN W WO CONTRAST  Vertigo of central origin - Plan: MR BRAIN W WO CONTRAST  Paraplegia, unspecified (HCC)  Cervical spinal stenosis  Gait disturbance  Dysconjugate gaze  Vision disturbance   In summary, Ms. Audia is a 74 year old woman with multiple sclerosis for many years who has been wheelchair-bound for at least the past 15 years.  She has never been on a disease modifying therapy for her MS.  In December, she had the onset of severe positional vertigo associated with nystagmus.  This is consistent with a peripheral etiology of the vertigo rather than a central etiology as could be seen with MS.  Surprisingly, MRI of the brain showed 2 small enhancing foci in the periventricular white matter.  Neither these should not cause any symptoms.  However,  they imply that there is some activity to her MS.  I had a long conversation with her and her son about the significance of the MRI findings.  Generally, for somebody her age who has had MS for more than 20 years and no definite exacerbation, I would not recommend initiating a disease modifying therapy.  However, because there were enhancing foci implying recent activity, I would recommend that she initiate a therapy.  She is not wanting to start any new medication at this time.  None of the disease modifying therapies were evaluated in patients of her age and thus it is difficult to gauge the risk to benefit profile.  She is willing to have a repeat MRI study later this year.  If we see additional changes going the interim, then I will more strongly urge her to initiate a DMT, likely Aubagio which has little risk and is a simple once a day pill.  However, if the MRI shows no new lesions then we will hold off but consider reimaging in another year or so.  She is also advised to follow-up with ophthalmology.  She will return to see me a couple weeks after the MRI so we can discuss the  findings.  She should call sooner if she has any significant new neurologic symptoms.  Thank you for asking me to see Ms. Perras.  Please let me know if I can be of further assistance with her or other patients in the future.  Valiant Dills A. Epimenio Foot, MD, [facility] 04/03/2020, 5:38 PM Certified in Neurology, Clinical Neurophysiology, Sleep Medicine and Neuroimaging  [facility] [address] [place], [place] [address] [phone]

## 2020-04-05 ENCOUNTER — Encounter: Payer: Self-pay | Admitting: Internal Medicine

## 2020-04-05 ENCOUNTER — Ambulatory Visit (INDEPENDENT_AMBULATORY_CARE_PROVIDER_SITE_OTHER): Payer: Medicare Other | Admitting: Internal Medicine

## 2020-04-05 ENCOUNTER — Other Ambulatory Visit: Payer: Self-pay

## 2020-04-05 DIAGNOSIS — M5440 Lumbago with sciatica, unspecified side: Secondary | ICD-10-CM

## 2020-04-05 DIAGNOSIS — M545 Low back pain, unspecified: Secondary | ICD-10-CM | POA: Insufficient documentation

## 2020-04-05 LAB — POC URINALSYSI DIPSTICK (AUTOMATED)
Bilirubin, UA: NEGATIVE
Blood, UA: NEGATIVE
Glucose, UA: NEGATIVE
Ketones, UA: NEGATIVE
Leukocytes, UA: NEGATIVE
Nitrite, UA: NEGATIVE
Protein, UA: NEGATIVE
Spec Grav, UA: 1.025 (ref 1.010–1.025)
Urobilinogen, UA: 0.2 E.U./dL
pH, UA: 6 (ref 5.0–8.0)

## 2020-04-05 NOTE — Progress Notes (Signed)
Subjective:    Patient ID: [name], female    DOB: Jun 09, 1946, 74 y.o.   MRN: [id number]  HPI Here due to new hip and back pain With son as usual This visit occurred during the SARS-CoV-2 public health emergency.  Safety protocols were in place, including screening questions prior to the visit, additional usage of staff PPE, and extensive cleaning of exam room while observing appropriate contact time as indicated for disinfecting solutions.   Yesterday afternoon--noticed sharp pains in hips and low back Goes into legs--down towards the feet Reminds her of pain before the back surgery  No falls--but has hit rectum against raised area on sides of commode seat for a while (son has not fixed this) No new tasks or lifting  Sharp--not quite constant If she rolls to side or any direction it hurts Leg pain is less consistent--and hasn't noticed that today  Tried moflex topically---no clear help (on hip)  Current Outpatient Medications on File Prior to Visit  Medication Sig Dispense Refill  . methenamine (HIPREX) 1 g tablet Take 1 tablet (1 g total) by mouth daily. (Patient taking differently: Take 1 g by mouth every other day. ) 30 tablet 0   No current facility-administered medications on file prior to visit.    Allergies  Allergen Reactions  . Procaine Hcl     REACTION: UNSPECIFIED    Past Medical History:  Diagnosis Date  . Ankle fracture, left 07/2006  . Ankle fracture, right 10/30/06   [facility]  . Compression fracture 2011   Dr. Mardelle Matte   . Disc herniation    L5, S1  . MS (multiple sclerosis) ([place])    evaluated by Dr. Erling Cruz  . MVP (mitral valve prolapse) 1979   ECHO. Trace MR, trace TR, trace PR 07/17/03  . Nephrolithiasis   . Osteoporosis   . Rib fractures 1998, [id number]   2  . Toe fracture 1998   5    Past Surgical History:  Procedure Laterality Date  . LUMBAR DISC SURGERY  04/2007   L5, S1- [facility]  . SEPTOPLASTY  1983   deviated septum  . TONSILLECTOMY AND  ADENOIDECTOMY  1960  . TOTAL ABDOMINAL HYSTERECTOMY W/ BILATERAL SALPINGOOPHORECTOMY  1982   for endometriosis    Family History  Problem Relation Age of Onset  . Stroke Mother   . Neuropathy Brother   . Stroke Father   . Hypertension Neg Hx   . Diabetes Neg Hx   . Depression Neg Hx   . Alcohol abuse Neg Hx   . Drug abuse Neg Hx     Social History   Socioeconomic History  . Marital status: Widowed    Spouse name: Not on file  . Number of children: 2  . Years of education: HS  . Highest education level: Not on file  Occupational History  . Occupation: Secretarial work---disabled  Tobacco Use  . Smoking status: Never Smoker  . Smokeless tobacco: Never Used  Substance and Sexual Activity  . Alcohol use: No  . Drug use: No  . Sexual activity: Not on file  Other Topics Concern  . Not on file  Social History Narrative   Widowed 1991.  Lives alone.    Played piano.       No living will   Son Shanon Brow should make decisions for her prn   Would accept resuscitation attempts   No tube feeds if cognitively unaware      Caffeine WHQ:PRFFMB decaf   Right  handed    Social Determinants of Health   Financial Resource Strain:   . Difficulty of Paying Living Expenses:   Food Insecurity:   . Worried About Programme researcher, broadcasting/film/video in the Last Year:   . Barista in the Last Year:   Transportation Needs:   . Freight forwarder (Medical):   Marland Kitchen Lack of Transportation (Non-Medical):   Physical Activity:   . Days of Exercise per Week:   . Minutes of Exercise per Session:   Stress:   . Feeling of Stress :   Social Connections:   . Frequency of Communication with Friends and Family:   . Frequency of Social Gatherings with Friends and Family:   . Attends Religious Services:   . Active Member of Clubs or Organizations:   . Attends Banker Meetings:   Marland Kitchen Marital Status:   Intimate Partner Violence:   . Fear of Current or Ex-Partner:   . Emotionally Abused:   Marland Kitchen  Physically Abused:   . Sexually Abused:    Review of Systems Had back pain last week--thought it might be from her urine No dysuria or hematuria    Objective:   Physical Exam  Constitutional: No distress.  Musculoskeletal:     Comments: Mild tenderness along lumbar spine and left paraspinal muscles No distinct hip tenderness Mild pain with full internal rotation of left hip---ROM normal on right  Neurological:  Doesn't lift legs already           Assessment & Plan:

## 2020-04-05 NOTE — Assessment & Plan Note (Addendum)
Sudden onset yesterday but no clear injury She is concerned for recurrence of her severe lumbar spine disease--but hard to tell with her paraparesis Will try heat, tylenol, topical diclofenac Hold off on imaging  Notes some urinary symptoms--but urinalysis completely normal

## 2020-04-05 NOTE — Patient Instructions (Addendum)
Please try a heating pad on your back, tylenol (like 650mg  three times a day) and over the counter diclofenac gel (an antiinflammatory) up to 4 times a day on the painful area of your back.

## 2020-05-02 ENCOUNTER — Telehealth: Payer: Self-pay | Admitting: Neurology

## 2020-05-02 NOTE — Telephone Encounter (Signed)
The contrast doe snot have any radiation.  But if she prefers we will do the MRI without

## 2020-05-02 NOTE — Telephone Encounter (Signed)
Noted. Pt is scheduled for MRI 05/15/20 at 4:50p at [facility] imaging.

## 2020-05-02 NOTE — Telephone Encounter (Signed)
Pt called to inform she would like to proceed to have MRI done WITH contrast

## 2020-05-02 NOTE — Telephone Encounter (Signed)
Dr. Sater- please advise 

## 2020-05-02 NOTE — Telephone Encounter (Signed)
Pt called stating she does not want to have the MRI w/o contrast states she would like to get the MRI without the radiation for the contrast  If possible

## 2020-05-02 NOTE — Telephone Encounter (Signed)
Called pt back and relayed Dr. Bonnita Hollow message. She would like to do a little more research about contrast and will call back to let us know how she would like to proceed for sure.

## 2020-05-15 ENCOUNTER — Ambulatory Visit
Admission: RE | Admit: 2020-05-15 | Discharge: 2020-05-15 | Disposition: A | Discharge status: Home / Self Care | Payer: Medicare Other | Source: Ambulatory Visit | Attending: Neurology | Admitting: Neurology

## 2020-05-15 ENCOUNTER — Other Ambulatory Visit: Payer: Self-pay | Admitting: Neurology

## 2020-05-15 DIAGNOSIS — H814 Vertigo of central origin: Secondary | ICD-10-CM

## 2020-05-15 DIAGNOSIS — G35 Multiple sclerosis: Secondary | ICD-10-CM | POA: Diagnosis not present

## 2020-05-16 ENCOUNTER — Telehealth: Payer: Self-pay | Admitting: *Deleted

## 2020-05-16 NOTE — Telephone Encounter (Signed)
-----   Message from Asa Lente, MD sent at 05/15/2020  8:02 PM EDT ----- Please let her know the MR of the brain did not show any new lesions

## 2020-05-16 NOTE — Telephone Encounter (Signed)
Called and spoke with pt about MRI results per Dr. Sater note. Pt verbalized understanding. °

## 2020-06-12 ENCOUNTER — Telehealth: Payer: Self-pay | Admitting: Internal Medicine

## 2020-06-12 NOTE — Telephone Encounter (Signed)
I called and spoke with   Triage at [facility] to verify that pt did go to ED. Toni Amend who initially spoke with [facility] said she was advised that pts leg was cold and there was concern for DVT. I spoke with Johnnye Lana NP at [facility] and she tried to have pt go to ED and pt left office about 54' ago and said she was not going to ED. I spoke with pts son and pt and pt said starting on 06/11/20 pt had more swelling in rt lower leg and the rt leg seems colder than the left leg. Pt is not having any CP or SOB. Pt said she does not want to go to ED for fear of being exposed to covid. I tried to express to pt and her son the need to go to [facility] or ED for eval and testing and pt said she wants an appt tomorrow afternoon at [facility]. I spoke with DR G who has appt 06/13/20 at 3:45 and he said to advise best care advice would be to go to [facility] or ED now but if pt refuses then will see pt on 06/13/20. Pt and pts son voiced understanding and scheduled appt with DR G on 06/13/20 with UC & ED precautions given again. Pt will keep legs elevated this evening and rest. FYI to Dr Reece Agar and Dr Alphonsus Sias as PCP.

## 2020-06-12 NOTE — Telephone Encounter (Signed)
Received call today from [facility] in regards to patient They stated that the patient was in there office and they believe she may have a DVT.  They stated that they have tried to tell the patient to go to the ED but patient refused and wanted to get her scheduled with [place] for tomorrow   Spoke with [facility] and was advised patient did not need to wait. She needed to go straight to the ED .   I advised [facility] that we did not want the patient to wait until tomorrow she needed to go the ED now. Rep with [facility] voiced understanding and stated she would try to convince the patient to go

## 2020-06-12 NOTE — Telephone Encounter (Signed)
Correction to previous note.  I spoke with Triage and was instructed that patient did not need to wait until tomorrow. She needed to go straight to the ED

## 2020-06-13 ENCOUNTER — Encounter: Payer: Self-pay | Admitting: Family Medicine

## 2020-06-13 ENCOUNTER — Other Ambulatory Visit: Payer: Self-pay

## 2020-06-13 ENCOUNTER — Ambulatory Visit (INDEPENDENT_AMBULATORY_CARE_PROVIDER_SITE_OTHER): Payer: Medicare Other | Admitting: Family Medicine

## 2020-06-13 ENCOUNTER — Ambulatory Visit
Admission: RE | Admit: 2020-06-13 | Discharge: 2020-06-13 | Disposition: A | Discharge status: Home / Self Care | Payer: Medicare Other | Source: Ambulatory Visit | Attending: Family Medicine | Admitting: Family Medicine

## 2020-06-13 VITALS — BP 118/76 | HR 71 | Temp 97.7°F | Ht 63.0 in

## 2020-06-13 DIAGNOSIS — M7989 Other specified soft tissue disorders: Secondary | ICD-10-CM

## 2020-06-13 DIAGNOSIS — G35 Multiple sclerosis: Secondary | ICD-10-CM | POA: Diagnosis not present

## 2020-06-13 DIAGNOSIS — E785 Hyperlipidemia, unspecified: Secondary | ICD-10-CM | POA: Diagnosis not present

## 2020-06-13 NOTE — Telephone Encounter (Signed)
Please check again on her this morning. With the leg being cold, there is concern for arterial insufficiency as well. If she has any significant leg pain, it could be a limb threatening emergency and she should not wait for the appt this afternoon

## 2020-06-13 NOTE — Assessment & Plan Note (Addendum)
Will need to r/o acute RLE DVT as cause of acute unilateral swelling. Good pulses points against arterial insufficiency. Possible developing lymphedema - discussed compression stocking use however she doesn't like to use them. Encouraged ongoing leg elevation.  Check labs today (CBC, CMP, TSH) and urine microalbumin, as well as STAT venous US - further treatment pending results. Reviewed red flags to seek ER care - dyspnea, chest pain, worsening leg swelling.

## 2020-06-13 NOTE — Telephone Encounter (Signed)
Spoke to pt. She cannot come at 1230. She will be able to make the appt with Dr Reece Agar this afternoon.

## 2020-06-13 NOTE — Patient Instructions (Addendum)
Labs today, urine test today (microalbumin).  See our referral coordinator to schedule venous ultrasound.  We will be in touch with results.

## 2020-06-13 NOTE — Progress Notes (Signed)
This visit was conducted in person.  BP 118/76 (BP Location: Left Arm, Patient Position: Sitting, Cuff Size: Normal)   Pulse 71   Temp 97.7 F (36.5 C) (Temporal)   Ht 5\' 3"  (1.6 m)   SpO2 100%   BMI 20.02 kg/m    CC: R leg swelling Subjective:    Patient ID: , female    DOB: Apr 14, 1946, 74 y.o.   MRN: [id number]  HPI: OLEDA BORSKI is a 74 y.o. female presenting on 06/13/2020 for Leg Swelling (C/o bilateral lower leg swelling, worse in right. Started 3 days ago.  Last night, had sharp, shooting pains in leg last night.  Pt accompanied by son, 06/15/2020- temp 98.0.)   Sister of [name].  3d h/o R lower leg swelling. No redness or warmth. Overnight she had shooting pains down leg. No h/o gout. Denies inciting trauma/injury or falls. Swelling present from dorsal foot up to knee on right. She feels swelling may be some better than yesterday.   She did eat a whole bag of doritos recently ?sodium related edema.   Several years ago had unilateral L leg swelling - however no DVT (2017).   On methenamine for UTI ppx.  H/o MS recently established with Dr 10-04-2005, not on medication at this time due to low activity.      Relevant past medical, surgical, family and social history reviewed and updated as indicated. Interim medical history since our last visit reviewed. Allergies and medications reviewed and updated. Outpatient Medications Prior to Visit  Medication Sig Dispense Refill  . methenamine (HIPREX) 1 g tablet Take 1 tablet (1 g total) by mouth daily. (Patient taking differently: Take 1 g by mouth every other day. ) 30 tablet 0   No facility-administered medications prior to visit.     Per HPI unless specifically indicated in ROS section below Review of Systems Objective:  BP 118/76 (BP Location: Left Arm, Patient Position: Sitting, Cuff Size: Normal)   Pulse 71   Temp 97.7 F (36.5 C) (Temporal)   Ht 5\' 3"  (1.6 m)   SpO2 100%   BMI 20.02 kg/m   Wt  Readings from Last 3 Encounters:  12/23/19 113 lb (51.3 kg)  11/27/19 113 lb 1.5 oz (51.3 kg)  07/02/17 113 lb (51.3 kg)      Physical Exam Vitals and nursing note reviewed.  Constitutional:      Appearance: Normal appearance. She is not ill-appearing.     Comments: In transport chair  Musculoskeletal:        General: Swelling present.     Right lower leg: Edema (tr) present.     Left lower leg: Edema (2+) present.     Comments:  Marked pedal edema RLE dorsal foot to knee 2+ DP bilaterally LLE cool to touch RLE warm to touch without erythema  Skin:    General: Skin is warm and dry.     Findings: No erythema or rash.  Neurological:     Mental Status: She is alert.       Results for orders placed or performed in visit on 04/05/20  POCT Urinalysis Dipstick (Automated)  Result Value Ref Range   Color, UA light yellow    Clarity, UA clear    Glucose, UA Negative Negative   Bilirubin, UA negative    Ketones, UA negative    Spec Grav, UA 1.025 1.010 - 1.025   Blood, UA negative    pH, UA 6.0 5.0 - 8.0  Protein, UA Negative Negative   Urobilinogen, UA 0.2 0.2 or 1.0 E.U./dL   Nitrite, UA negative    Leukocytes, UA Negative Negative   Assessment & Plan:  This visit occurred during the SARS-CoV-2 public health emergency.  Safety protocols were in place, including screening questions prior to the visit, additional usage of staff PPE, and extensive cleaning of exam room while observing appropriate contact time as indicated for disinfecting solutions.   Problem List Items Addressed This Visit    Right leg swelling - Primary    Will need to r/o acute RLE DVT as cause of acute unilateral swelling. Good pulses points against arterial insufficiency. Possible developing lymphedema - discussed compression stocking use however she doesn't like to use them. Encouraged ongoing leg elevation.  Check labs today (CBC, CMP, TSH) and urine microalbumin, as well as STAT venous US - further  treatment pending results.      Relevant Orders   Comprehensive metabolic panel   TSH   CBC with Differential/Platelet   US Venous Img Lower Unilateral Right   Microalbumin / creatinine urine ratio   Multiple sclerosis (HCC)   Dyslipidemia   Relevant Orders   TSH   Microalbumin / creatinine urine ratio       No orders of the defined types were placed in this encounter.  Orders Placed This Encounter  Procedures  . US Venous Img Lower Unilateral Right    Standing Status:   Future    Number of Occurrences:   1    Standing Expiration Date:   06/13/2021    Order Specific Question:   Reason for Exam (SYMPTOM  OR DIAGNOSIS REQUIRED)    Answer:   RLE swelling    Order Specific Question:   Preferred imaging location?    Answer:   [facility]    Order Specific Question:   Call Results- Best Contact Number?    Answer:   [phone] hold patient   . Comprehensive metabolic panel  . TSH  . CBC with Differential/Platelet  . Microalbumin / creatinine urine ratio    Standing Status:   Future    Standing Expiration Date:   06/13/2021    Patient Instructions  Labs today, urine test today (microalbumin).  See our referral coordinator to schedule venous ultrasound.  We will be in touch with results.    Follow up plan: Return if symptoms worsen or fail to improve.  Eustaquio Boyden, MD

## 2020-06-14 ENCOUNTER — Telehealth: Payer: Self-pay

## 2020-06-14 LAB — CBC WITH DIFFERENTIAL/PLATELET
Basophils Absolute: 0.1 10*3/uL (ref 0.0–0.1)
Basophils Relative: 1.1 % (ref 0.0–3.0)
Eosinophils Absolute: 0.1 10*3/uL (ref 0.0–0.7)
Eosinophils Relative: 1.2 % (ref 0.0–5.0)
HCT: 40.7 % (ref 36.0–46.0)
Hemoglobin: 13.7 g/dL (ref 12.0–15.0)
Lymphocytes Relative: 31.1 % (ref 12.0–46.0)
Lymphs Abs: 2.6 10*3/uL (ref 0.7–4.0)
MCHC: 33.7 g/dL (ref 30.0–36.0)
MCV: 92.1 fl (ref 78.0–100.0)
Monocytes Absolute: 0.9 10*3/uL (ref 0.1–1.0)
Monocytes Relative: 10.5 % (ref 3.0–12.0)
Neutro Abs: 4.7 10*3/uL (ref 1.4–7.7)
Neutrophils Relative %: 56.1 % (ref 43.0–77.0)
Platelets: 422 10*3/uL — ABNORMAL HIGH (ref 150.0–400.0)
RBC: 4.42 Mil/uL (ref 3.87–5.11)
RDW: 12.5 % (ref 11.5–15.5)
WBC: 8.4 10*3/uL (ref 4.0–10.5)

## 2020-06-14 LAB — COMPREHENSIVE METABOLIC PANEL
ALT: 42 U/L — ABNORMAL HIGH (ref 0–35)
AST: 25 U/L (ref 0–37)
Albumin: 4.3 g/dL (ref 3.5–5.2)
Alkaline Phosphatase: 90 U/L (ref 39–117)
BUN: 15 mg/dL (ref 6–23)
CO2: 29 mEq/L (ref 19–32)
Calcium: 9.8 mg/dL (ref 8.4–10.5)
Chloride: 103 mEq/L (ref 96–112)
Creatinine, Ser: 0.55 mg/dL (ref 0.40–1.20)
GFR: 107.99 mL/min (ref >60.00)
Glucose, Bld: 98 mg/dL (ref 70–99)
Potassium: 4.6 mEq/L (ref 3.5–5.1)
Sodium: 140 mEq/L (ref 135–145)
Total Bilirubin: 0.2 mg/dL (ref 0.2–1.2)
Total Protein: 6.8 g/dL (ref 6.0–8.3)

## 2020-06-14 LAB — TSH: TSH: 4.46 u[IU]/mL (ref 0.35–4.50)

## 2020-06-14 NOTE — Telephone Encounter (Signed)
I am certainly okay with that if he will accept her.

## 2020-06-14 NOTE — Telephone Encounter (Signed)
Patient contacted the office and stated that she would like to transfer care to Dr. Sharen Hones. She states her sons & brother used to be patient's of Dr. Reece Agar before they moved away. She states she feels very comfortable with Dr.G and would like to transfer care to him. I did advise that he was not taking new patient's at this time, but I would certainly ask.  Please advise.

## 2020-06-14 NOTE — Addendum Note (Signed)
Addended by: Alvina Chou on: 06/14/2020 04:20 PM   Modules accepted: Orders

## 2020-06-14 NOTE — Telephone Encounter (Signed)
I saw her brother before he moved to [place].  I spoke with patient about this yesterday and let her know I was not taking new patients at this time. Don't recommend transfer care.

## 2020-06-15 LAB — MICROALBUMIN / CREATININE URINE RATIO
Creatinine,U: 122.8 mg/dL
Microalb Creat Ratio: 0.9 mg/g (ref 0.0–30.0)
Microalb, Ur: 1.1 mg/dL (ref 0.0–1.9)

## 2020-07-03 ENCOUNTER — Encounter: Payer: Self-pay | Admitting: Neurology

## 2020-07-03 ENCOUNTER — Other Ambulatory Visit: Payer: Self-pay

## 2020-07-03 ENCOUNTER — Ambulatory Visit (INDEPENDENT_AMBULATORY_CARE_PROVIDER_SITE_OTHER): Payer: Medicare Other | Admitting: Neurology

## 2020-07-03 VITALS — BP 128/66 | HR 59 | Ht 63.0 in

## 2020-07-03 DIAGNOSIS — H518 Other specified disorders of binocular movement: Secondary | ICD-10-CM | POA: Diagnosis not present

## 2020-07-03 DIAGNOSIS — G35 Multiple sclerosis: Secondary | ICD-10-CM

## 2020-07-03 DIAGNOSIS — G8220 Paraplegia, unspecified: Secondary | ICD-10-CM

## 2020-07-03 DIAGNOSIS — N319 Neuromuscular dysfunction of bladder, unspecified: Secondary | ICD-10-CM | POA: Diagnosis not present

## 2020-07-03 DIAGNOSIS — R269 Unspecified abnormalities of gait and mobility: Secondary | ICD-10-CM

## 2020-07-03 MED ORDER — OXYBUTYNIN CHLORIDE 5 MG PO TABS: ORAL_TABLET | ORAL | 5 refills | Status: DC

## 2020-07-03 NOTE — Progress Notes (Signed)
[facility]  PATIENT: [name] DOB: [birth year]  REFERRING DOCTOR OR PCP: Tillman Abide MD SOURCE: Patient, notes from primary care, notes from neurology 2014 and recent hospital admission, imaging and laboratory reports, MRI images personally reviewed.  _________________________________   HISTORICAL  CHIEF COMPLAINT:  Chief Complaint  Patient presents with  . Follow-up    RM 12 with son, [name].  Last seen 04/03/2020. In [facility] in office today.  Denies any new sx, doing well.   . Multiple Sclerosis    Off DMT.    HISTORY OF PRESENT ILLNESS:   Update 7/202/2021: Since the last visit, she has not had any new symptoms.  We rechecked her MRI in June 2021 and it did not show any new lesions.    She is noting a little more difficulty controlling her bladder.   She has had more incontinence.  She reports urgency more than hesitancy.  She feels this has worsened since her hospital stay in December.  Of note, while in the hospital she used pads and was doing better with her bladder before.  Before the hospital stay, while at home, she was using the bathroom exclusively.  She felt she lost a little but of ground with her strength during that hospitalization, despite some PT.   She is reporting pain and swelling in her right leg.  A venous doppler 06/13/20 was negative.   She reports more swelling last month but more pain currently.    She is in a wheelchair but can transfer independently.  She has been wheelchair bound 10 years.   MS History:    She was diagnosed with MS in 1982 after presenting with right arm numbness.   In retrospect, she reports that after the swine flu vaccination in the 1970's she had trouble with her walking.   She had a myelogram and reports her gait was worse afterwards.  She needed to hold on for balance since 1982.   She started to use a walker in 1995 part time and most of the time around 2005.   She has been wheelchair-bound since 2010.Marland Kitchen  She is able to transfer from the wheelchair to the toilet but cannot bear much weight.   She was seeing Dr. Sandria Manly before 2014 and saw Dr. Marjory Lies once in 2014.Marland Kitchen    She was never on a DMT.    She has not had any definite exacerbation in the last 15 years.  However, she does feel that she has slowly worsened.    She had fractures of the right ankle in 2006, left ankle in 2007 and thoracic spine in 2011, She had vertebroplasty or kyphoplasty.    With each one, she had permanent worsening.    She had vertigo November or December 2020.  When vertigo worsened she went to the ED.  Her son notes that she had nystagmus when the vertigo spells occured.  The some of the episodes occurred associated with position change.   MRI showed white matter changes c/w MS and she had 2 small hemispheric enhancing lesion.    She was admitted and received IV Solumedrol.   She also received PT and vestibular therapy.  She notes that after one of the sessions of vestibular therapy that the vertigo markedly improved.  The MRI was repeated in June 2021.  She did not appear to have any new lesions (though that MRI was done without contrast)    MRI Images: MRI of the brain 11/26/2019 showed multiple T2/FLAIR  hyperintense foci in the periventricular, juxtacortical and deep white matter.  The infratentorial white matter appears normal.  There were 2 small enhancing lesions, both in the periventricular white matter (these small lesions would not be expected to cause vertigo or any other symptoms.).  I also reviewed the MRI of the cervical spine from May 07, 2016.  It shows multiple single and confluent T2 hyperintense foci within the spinal cord.  Some of the foci are peripherally located, consistent with multiple sclerosis lesions.  MRI of the brain was repeated June 2021.  There were no new lesions.  REVIEW OF SYSTEMS: Constitutional: No fevers, chills, sweats, or change in appetite Eyes: She notes visual changes.  She has a  dysconjugate gaze but denies diplopia Ear, nose and throat: No hearing loss, ear pain, nasal congestion, sore throat Cardiovascular: No chest pain, palpitations Respiratory: No shortness of breath at rest or with exertion.   No wheezes GastrointestinaI: No nausea, vomiting, diarrhea, abdominal pain, fecal incontinence Genitourinary:Urinary urgency. Musculoskeletal: No neck pain, back pain Integumentary: No rash, pruritus, skin lesions Neurological: as above Psychiatric: No depression at this time.  No anxiety Endocrine: No palpitations, diaphoresis, change in appetite, change in weigh or increased thirst Hematologic/Lymphatic: No anemia, purpura, petechiae. Allergic/Immunologic: No itchy/runny eyes, nasal congestion, recent allergic reactions, rashes  ALLERGIES: Allergies  Allergen Reactions  . Procaine Hcl     REACTION: UNSPECIFIED    HOME MEDICATIONS:  Current Outpatient Medications:  .  methenamine (HIPREX) 1 g tablet, Take 1 tablet (1 g total) by mouth daily. (Patient taking differently: Take 1 g by mouth every other day. ), Disp: 30 tablet, Rfl: 0 .  oxybutynin (DITROPAN) 5 MG tablet, Take 1/2  To 1 pill once or twice a day, Disp: 60 tablet, Rfl: 5  PAST MEDICAL HISTORY: Past Medical History:  Diagnosis Date  . Ankle fracture, left 07/2006  . Ankle fracture, right 10/30/06   [facility]  . Compression fracture 2011   Dr. Dion Saucier   . Disc herniation    L5, S1  . MS (multiple sclerosis) (HCC)    evaluated by Dr. Sandria Manly  . MVP (mitral valve prolapse) 1979   ECHO. Trace MR, trace TR, trace PR 07/17/03  . Nephrolithiasis   . Osteoporosis   . Rib fractures 1998, [id number]   2  . Toe fracture 1998   5    PAST SURGICAL HISTORY: Past Surgical History:  Procedure Laterality Date  . LUMBAR DISC SURGERY  04/2007   L5, S1- [facility]  . SEPTOPLASTY  1983   deviated septum  . TONSILLECTOMY AND ADENOIDECTOMY  1960  . TOTAL ABDOMINAL HYSTERECTOMY W/ BILATERAL SALPINGOOPHORECTOMY  1982    for endometriosis    FAMILY HISTORY: Family History  Problem Relation Age of Onset  . Stroke Mother   . Neuropathy Brother   . Stroke Father   . Hypertension Neg Hx   . Diabetes Neg Hx   . Depression Neg Hx   . Alcohol abuse Neg Hx   . Drug abuse Neg Hx     SOCIAL HISTORY:  Social History   Socioeconomic History  . Marital status: Widowed    Spouse name: Not on file  . Number of children: 2  . Years of education: HS  . Highest education level: Not on file  Occupational History  . Occupation: Secretarial work---disabled  Tobacco Use  . Smoking status: Never Smoker  . Smokeless tobacco: Never Used  Substance and Sexual Activity  . Alcohol use: No  .  Drug use: No  . Sexual activity: Not on file  Other Topics Concern  . Not on file  Social History Narrative   Widowed 1991.  Lives alone.    Played piano.       No living will   Son [name] should make decisions for her prn   Would accept resuscitation attempts   No tube feeds if cognitively unaware      Caffeine OIZ:TIWPYK decaf   Right handed    Social Determinants of Health   Financial Resource Strain:   . Difficulty of Paying Living Expenses:   Food Insecurity:   . Worried About Programme researcher, broadcasting/film/video in the Last Year:   . Barista in the Last Year:   Transportation Needs:   . Freight forwarder (Medical):   Marland Kitchen Lack of Transportation (Non-Medical):   Physical Activity:   . Days of Exercise per Week:   . Minutes of Exercise per Session:   Stress:   . Feeling of Stress :   Social Connections:   . Frequency of Communication with Friends and Family:   . Frequency of Social Gatherings with Friends and Family:   . Attends Religious Services:   . Active Member of Clubs or Organizations:   . Attends Banker Meetings:   Marland Kitchen Marital Status:   Intimate Partner Violence:   . Fear of Current or Ex-Partner:   . Emotionally Abused:   Marland Kitchen Physically Abused:   . Sexually Abused:      PHYSICAL  EXAM  Vitals:   07/03/20 1542  BP: 128/66  Pulse: (!) 59  SpO2: 98%  Height: 5\' 3"  (1.6 m)    Body mass index is 20.02 kg/m.   General: The patient is well-developed and well-nourished and in no acute distress.  She is in a wheelchair.  HEENT:  Head is [place]/AT.  Sclera are anicteric.     Skin: Extremities are without rash.  She has pedal edema, right greater than left.  She has mild tenderness in both legs to deep palpation.  The legs have normal temperature.  Musculoskeletal:  Back is nontender  Neurologic Exam  Mental status: The patient is alert and oriented x 3 at the time of the examination. The patient has apparent normal recent and remote memory, with an apparently normal attention span and concentration ability.   Speech is normal.  Cranial nerves: She has a disconjugate gaze but no nystagmus.   Facial symmetry is present. There is good facial sensation to soft touch bilaterally.Facial strength is normal.  Trapezius and sternocleidomastoid strength is normal. No dysarthria is noted.  No obvious hearing deficits are noted.  Motor:  Muscle bulk is normal.   Tone is normal. Strength is  5 / 5 in the arms.  Strength is 2/5 with hip extension, 2+/5 with lower leg extension and 2/5 in the feet and ankles.  Sensory: She has mildly reduced vibration sensation in the right leg.  Coordination: Cerebellar testing reveals good finger-nose-finger.  She is unable to do heel-to-shin.  Gait and station:    She cannot stand or walk.   Reflexes: Deep tendon reflexes are symmetric and normal in arms, trace at the knees and absent at the ankles.      DIAGNOSTIC DATA (LABS, IMAGING, TESTING) - I reviewed patient records, labs, notes, testing and imaging myself where available.  Lab Results  Component Value Date   WBC 8.4 06/13/2020   HGB 13.7 06/13/2020   HCT 40.7  06/13/2020   MCV 92.1 06/13/2020   PLT 422.0 (H) 06/13/2020      Component Value Date/Time   NA 140 06/13/2020 1621    K 4.6 06/13/2020 1621   CL 103 06/13/2020 1621   CO2 29 06/13/2020 1621   GLUCOSE 98 06/13/2020 1621   BUN 15 06/13/2020 1621   CREATININE 0.55 06/13/2020 1621   CREATININE 0.65 08/26/2016 1642   CALCIUM 9.8 06/13/2020 1621   PROT 6.8 06/13/2020 1621   ALBUMIN 4.3 06/13/2020 1621   AST 25 06/13/2020 1621   ALT 42 (H) 06/13/2020 1621   ALKPHOS 90 06/13/2020 1621   BILITOT 0.2 06/13/2020 1621   GFRNONAA >60 12/07/2019 0657   GFRAA >60 12/07/2019 0657    Lab Results  Component Value Date   VITAMINB12 278 11/28/2019   Lab Results  Component Value Date   TSH 4.46 06/13/2020       ASSESSMENT AND PLAN  Multiple sclerosis (HCC)  Neurogenic bladder  Paraplegia, unspecified (HCC)  Dysconjugate gaze  Gait disturbance  1.   There were no new lesions on recent MRI.  She will hold off on starting a disease modifying therapy. 2.   Trial of low-dose oxybutynin for her bladder.  If she feels that hesitancy worsens she should stop. 3.   If leg pain or swelling worsens he will discuss further with primary care. 4.   Return in 1 year or sooner if there are new or worsening neurologic. Davyon Fisch A. Epimenio Foot, MD, [facility] 07/03/2020, 4:54 PM Certified in Neurology, Clinical Neurophysiology, Sleep Medicine and Neuroimaging  [facility] [address] [place], [place] [address] [phone]

## 2020-08-16 ENCOUNTER — Other Ambulatory Visit: Payer: Self-pay | Admitting: Orthopedic Surgery

## 2020-08-16 DIAGNOSIS — M25511 Pain in right shoulder: Secondary | ICD-10-CM

## 2020-09-05 ENCOUNTER — Telehealth: Payer: Self-pay | Admitting: Internal Medicine

## 2020-09-05 ENCOUNTER — Telehealth: Payer: Self-pay

## 2020-09-05 MED ORDER — "SYRINGE 25G X 1"" 3 ML MISC": 1.0000 | 11 refills | Status: AC

## 2020-09-05 MED ORDER — CYANOCOBALAMIN 1000 MCG/ML IJ SOLN
1000.0000 ug | Freq: Once | INTRAMUSCULAR | 11 refills | Status: AC
Start: 2020-09-05 — End: 2020-09-05

## 2020-09-05 NOTE — Telephone Encounter (Signed)
Patient contacted the office and states that she needs to speak with Dr. Alphonsus Sias ASAP. She would not relay any information to me. She states it is an emergency, and I asked if this was something that she needed to speak with the triage nurse for, and she states no, she just needs to speak with Dr. Alphonsus Sias.

## 2020-09-05 NOTE — Telephone Encounter (Signed)
[name] (son) called asking about b12 inj.  He stated in the passed when she took b12 inj this seemed to help with muscle and nerves.  She did a lot better when taking b12 inj.  They know a RN personally that could come give pt her injections if dr Alphonsus Sias is ok with this.

## 2020-09-05 NOTE — Telephone Encounter (Signed)
I am okay with this Send Rx for B12--- to be 1000 mcg monthly. Will need Rx for insulin syringes/needles as well  Let him know that I spoke to her--and there is no easy way to get her into a facility while her foot heals---without paying privately  (but I urged her to call her insurance to confirm)

## 2020-09-05 NOTE — Telephone Encounter (Signed)
Spoke to her She fell yesterday --injured left foot and can't put pressure on it Son now taking off and has to lift her Wonders about going into a skilled facility for a month to recover  Was seen at [facility] today  X-ray negative  Suggested she call her insurance company Doubt there will be coverage for twisted ankle for SNF--but possible (not clear she needs skilled care, just custodial) She doesn't have money to pay $300 per day Discussed hiring home care to fill in when family can't be there

## 2020-09-05 NOTE — Telephone Encounter (Signed)
Spoke to pt's son. Sent in Rx and syringes.

## 2020-09-06 ENCOUNTER — Other Ambulatory Visit: Payer: Self-pay

## 2020-09-06 ENCOUNTER — Ambulatory Visit
Admission: RE | Admit: 2020-09-06 | Discharge: 2020-09-06 | Disposition: A | Discharge status: Home / Self Care | Payer: Medicare Other | Source: Ambulatory Visit | Attending: Orthopedic Surgery | Admitting: Orthopedic Surgery

## 2020-09-06 DIAGNOSIS — M25511 Pain in right shoulder: Secondary | ICD-10-CM

## 2020-09-07 ENCOUNTER — Telehealth: Payer: Self-pay | Admitting: *Deleted

## 2020-09-07 NOTE — Telephone Encounter (Signed)
Sue Lush case mgr with [facility] left v/m that pt has hurt lt foot and appears fx of lt foot; pt cannot bear wt on lt ft. [facility] is trying to get skilled nursing for rehab. Need waiver for skilled case. I left v/m requesting cb from Sue Lush case mgr with [facility].unable to reach pt by phone; I spoke with Lilia Pro  pts son (DPR signed) pt has been taken to ortho for care of fx lt foot and is in a boot; Pt cannot bear any wt on foot so pts son has been with her since 09/03/20 and lifts her to commode or chair. Pt cannot use walker. Pt and pts son is in agreement for pt to go to rehab facility and they talked with Sue Lush about going to [facility]. Dr Alphonsus Sias is out of office and will send note to DR Alphonsus Sias and Pamala Hurry NP who is in office for review.

## 2020-09-07 NOTE — Telephone Encounter (Signed)
We will have to defer this to Dr. Alphonsus Sias as I do not know this patient.  They would need to be seen for an FL2

## 2020-09-09 NOTE — Telephone Encounter (Signed)
Let them know that I have contacted [name]--the admissions coordinator. I should be able to do the FL-2 without a visit and then see her when she gets to [name]---if they are going to take her

## 2020-09-10 NOTE — Telephone Encounter (Signed)
Spoke to son. He will call [facility] and see what he can do.

## 2020-09-11 ENCOUNTER — Ambulatory Visit: Payer: Medicare Other | Attending: Internal Medicine

## 2020-09-11 ENCOUNTER — Telehealth: Payer: Self-pay | Admitting: *Deleted

## 2020-09-11 DIAGNOSIS — Z23 Encounter for immunization: Secondary | ICD-10-CM

## 2020-09-11 NOTE — Telephone Encounter (Signed)
[facility] care manager left a voicemail stating that she is trying to get the patient a hoyar lift. [name] stated that they need this for her home. [name] stated that she needs an order and office notes to support this. Patient requested a call back about getting what she needs for the patient. [name] stated when you call back she can give the information where the order and the notes need to be sent to.

## 2020-09-11 NOTE — Telephone Encounter (Signed)
Spoke to son about placement. She got her J&J vaccine today. Sue Lush said that was fine. I gave him the number to [facility] to see if they have any information on caretakers to come in to the home during the day who are vaccinated to help care for her while he is at work for the next 2 weeks until she can get place ment somewhere.

## 2020-09-11 NOTE — Progress Notes (Signed)
   Covid-19 Vaccination Clinic  Name:  [name]    MRN: [id number] DOB: 1946/01/31  09/11/2020  [name] was observed post Covid-19 immunization for 30 minutes based on pre-vaccination screening without incident. She was provided with Vaccine Information Sheet and instruction to access the V-Safe system.   [name] was instructed to call 911 with any severe reactions post vaccine: Marland Kitchen Difficulty breathing  . Swelling of face and throat  . A fast heartbeat  . A bad rash all over body  . Dizziness and weakness   Immunizations Administered    Name Date Dose VIS Date Route   JANSSEN COVID-19 VACCINE 09/11/2020 10:18 AM 0.5 mL 02/11/2020 Intramuscular   Manufacturer: [employer]   Lot: [id number]   NDC: 90300-923-30

## 2020-09-12 NOTE — Telephone Encounter (Signed)
Left message on verified VM.

## 2020-09-12 NOTE — Telephone Encounter (Signed)
I can write an order but I don't have a face to face since she had her injury. She may need to get this from the ortho office that she went to

## 2020-09-18 ENCOUNTER — Telehealth: Payer: Self-pay

## 2020-09-18 NOTE — Telephone Encounter (Signed)
[name] case mgr with [facility] said that she has been working with [name] at [facility] who thinks pt will be able to be admitted to short term rehab next wk and needs FL2 form filled out.[name]  Request cb.  This is note from phone note on 09/07/20;September 26, 2021Letvak, Richard I, MDto Bridges, Birdie Sons, CMA     10:37 AMNote   Let them know that I have contacted [name]--the admissions coordinator. I should be able to do the FL-2 without a visit and then see her when she gets to [name]---if they are going to take her     FL2 form in Dr Karle Starch in box.

## 2020-09-19 NOTE — Telephone Encounter (Signed)
I left a detailed message on [name]'s voice mail to let her know Dr. Alphonsus Sias completed Fl-2 form.  I asked her to return my call to let me know she received this message.  The form is in the yellow folders at the front desk.

## 2020-09-19 NOTE — Telephone Encounter (Signed)
Form done 

## 2020-09-19 NOTE — Telephone Encounter (Signed)
Sue Lush left v/m returning call to Lyla Son and Sue Lush request cb.

## 2020-09-20 NOTE — Telephone Encounter (Signed)
Sue Lush called back and asked me to fax form to facility.  Form faxed.

## 2020-09-20 NOTE — Telephone Encounter (Signed)
I left a message on [name]'s voice mail to let her know Fl-2 is at the front desk.  If she wants it faxed to her, call back and let me know.

## 2020-09-24 NOTE — Telephone Encounter (Signed)
[name] lvm stating stating FL 2 form has not been received yet.  Requesting form be faxed to [phone] asap.  Her call back # is [phone].

## 2020-09-24 NOTE — Telephone Encounter (Signed)
Form re-faxed to [employer].

## 2020-10-02 ENCOUNTER — Telehealth: Payer: Self-pay | Admitting: Internal Medicine

## 2020-10-02 NOTE — Telephone Encounter (Signed)
Spoke with Davd (son)  He stated rehab people Eulah Pont and wainer  stated pt would benefit from hospital bed.

## 2020-10-02 NOTE — Telephone Encounter (Signed)
Rehab recommend a bed that raises and up and down.  Also the head and legs can be adjusted to pt comfort.  Railings also

## 2020-10-03 ENCOUNTER — Telehealth: Payer: Self-pay

## 2020-10-03 ENCOUNTER — Telehealth: Payer: Self-pay | Admitting: Internal Medicine

## 2020-10-03 DIAGNOSIS — Z0279 Encounter for issue of other medical certificate: Secondary | ICD-10-CM

## 2020-10-03 NOTE — Telephone Encounter (Signed)
Spoke to [employer] He said if a home visit could be done, that would be great. He is having to pay for home aide and they haven't been showing up. He said [facility] told him there was no spaces.

## 2020-10-03 NOTE — Telephone Encounter (Signed)
Pt has been seeing patient at home. States that they think she needs order for hospital bed with rails, adjustable head and foot as well as adjustable hight. With a T5 mattress. She would like to have order placed for this.

## 2020-10-03 NOTE — Telephone Encounter (Signed)
Spoke to [employer] He said if it would be easier, he could bring her in to the office for a visit but it has to be an afternoon appt. Would you want to put her on at the end of the afternoon this Monday?

## 2020-10-03 NOTE — Telephone Encounter (Signed)
Spoke to son. Made appt for Monday 10-25 at 4pm

## 2020-10-03 NOTE — Telephone Encounter (Signed)
fmla paperwork in dr Vassie Moselle in box  Spoke with [name] (son) To help take care of pt He stated he would like FMLA paperwork to start 09/04/20 to 09/19/20 continuous .  And starting 09/20/20 intermittent  5 times a month lasting up to 8 hours  This is in case pt's caregivers don't show for there shift.

## 2020-10-03 NOTE — Telephone Encounter (Signed)
That sounds like a good idea but it requires a face to face visit to order Is she going to [name]---if not, I might need to try to make a home visit

## 2020-10-03 NOTE — Telephone Encounter (Signed)
I think I filled in all I needed to ---please check Thanks for your help

## 2020-10-03 NOTE — Telephone Encounter (Signed)
Let him know that I will try to get out there next week---but it might be the next week

## 2020-10-03 NOTE — Telephone Encounter (Signed)
Yes--that would be good (I don't think I can get there next week)

## 2020-10-03 NOTE — Telephone Encounter (Signed)
See previous message about this

## 2020-10-04 NOTE — Telephone Encounter (Signed)
[name] aware paperwork is ready for pick up and that he will need to turn in

## 2020-10-05 NOTE — Telephone Encounter (Signed)
Press photographer of clinical operations for [employer] [facility] agency) left v/m that pt has appt already scheduled with Dr Alphonsus Sias on 10/08/20 for order for hospital bed and [facility] requesting an order for hoyer lift due to pt condition weakening and pt needs extra support to move around. Pt son is aware of this request for hoyer lift to Dr Alphonsus Sias. FYI to Dr Alphonsus Sias.

## 2020-10-05 NOTE — Telephone Encounter (Signed)
I can document that on Monday as well

## 2020-10-05 NOTE — Telephone Encounter (Signed)
Copy for scan Copy for pt Copy for billing 

## 2020-10-08 ENCOUNTER — Ambulatory Visit (INDEPENDENT_AMBULATORY_CARE_PROVIDER_SITE_OTHER): Payer: Medicare Other | Admitting: Internal Medicine

## 2020-10-08 ENCOUNTER — Other Ambulatory Visit: Payer: Self-pay

## 2020-10-08 ENCOUNTER — Encounter: Payer: Self-pay | Admitting: Internal Medicine

## 2020-10-08 DIAGNOSIS — S93402D Sprain of unspecified ligament of left ankle, subsequent encounter: Secondary | ICD-10-CM

## 2020-10-08 DIAGNOSIS — M12811 Other specific arthropathies, not elsewhere classified, right shoulder: Secondary | ICD-10-CM

## 2020-10-08 DIAGNOSIS — M75101 Unspecified rotator cuff tear or rupture of right shoulder, not specified as traumatic: Secondary | ICD-10-CM | POA: Diagnosis not present

## 2020-10-08 DIAGNOSIS — S93402A Sprain of unspecified ligament of left ankle, initial encounter: Secondary | ICD-10-CM | POA: Insufficient documentation

## 2020-10-08 DIAGNOSIS — G35 Multiple sclerosis: Secondary | ICD-10-CM | POA: Diagnosis not present

## 2020-10-08 NOTE — Assessment & Plan Note (Signed)
Disabled and chair/bed bound due to this No meds

## 2020-10-08 NOTE — Progress Notes (Signed)
Subjective:    Patient ID: [name], female    DOB: Aug 21, 1946, 74 y.o.   MRN: [id number]  HPI Here for face to face evaluation for home needs With son This visit occurred during the SARS-CoV-2 public health emergency.  Safety protocols were in place, including screening questions prior to the visit, additional usage of staff PPE, and extensive cleaning of exam room while observing appropriate contact time as indicated for disinfecting solutions.   She slid down off bed--got left foot caught in wheelchair This happened just about a month ago (9/21) Seen at orthopedist---x-ray did not show fracture but has soft tissue injury In boot and no weight bearing allowed  Also with right shoulder tendon tear ---she cannot lift herself up to transfer anymore She hasn't made a decision about surgery but is leaning towards it  Wheelchair bound from her MS  Son lives with her since this happened He went back to work ~2 weeks ago  Son was picking her up to transfer her--but aides can't She still uses slide board some for transfer Aides will need [employer] lift (as they can't get her off the commode even if she can get on with slide board  Now can't move herself in bed--due to the MS and shoulder injury She often spends 11-12 or more hours in bed (due to limitations of help) and cannot adjust herself Will need hospital bed for this With her MS and shoulder, she requires positioning of her arm, trunk and legs to be positioned and moved in ways not feasible in a normal bed. Her head should be up at least 30 degrees due to severe vertigo when supine. Positioning via a hospital bed is required to prevent decubitus ulcers as well (to facilitate changes in body position)  Current Outpatient Medications on File Prior to Visit  Medication Sig Dispense Refill  . cyanocobalamin (,VITAMIN B-12,) 1000 MCG/ML injection Inject 1,000 mcg into the muscle once.    . methenamine (HIPREX) 1 g tablet Take 1  tablet (1 g total) by mouth daily. (Patient taking differently: Take 1 g by mouth every other day. ) 30 tablet 0  . oxybutynin (DITROPAN) 5 MG tablet Take 1/2  To 1 pill once or twice a day 60 tablet 5   No current facility-administered medications on file prior to visit.    Allergies  Allergen Reactions  . Procaine Hcl     REACTION: UNSPECIFIED    Past Medical History:  Diagnosis Date  . Ankle fracture, left 07/2006  . Ankle fracture, right 10/30/06   [facility]  . Compression fracture 2011   Dr. Dion Saucier   . Disc herniation    L5, S1  . MS (multiple sclerosis) (HCC)    evaluated by Dr. Sandria Manly  . MVP (mitral valve prolapse) 1979   ECHO. Trace MR, trace TR, trace PR 07/17/03  . Nephrolithiasis   . Osteoporosis   . Rib fractures 1998, [id number]   2  . Toe fracture 1998   5    Past Surgical History:  Procedure Laterality Date  . LUMBAR DISC SURGERY  04/2007   L5, S1- [facility]  . SEPTOPLASTY  1983   deviated septum  . TONSILLECTOMY AND ADENOIDECTOMY  1960  . TOTAL ABDOMINAL HYSTERECTOMY W/ BILATERAL SALPINGOOPHORECTOMY  1982   for endometriosis    Family History  Problem Relation Age of Onset  . Stroke Mother   . Neuropathy Brother   . Stroke Father   . Hypertension Neg Hx   .  Diabetes Neg Hx   . Depression Neg Hx   . Alcohol abuse Neg Hx   . Drug abuse Neg Hx     Social History   Socioeconomic History  . Marital status: Widowed    Spouse name: Not on file  . Number of children: 2  . Years of education: HS  . Highest education level: Not on file  Occupational History  . Occupation: Secretarial work---disabled  Tobacco Use  . Smoking status: Never Smoker  . Smokeless tobacco: Never Used  Substance and Sexual Activity  . Alcohol use: No  . Drug use: No  . Sexual activity: Not on file  Other Topics Concern  . Not on file  Social History Narrative   Widowed 1991.  Lives alone.    Played piano.       No living will   Son [name] should make decisions for her prn    Would accept resuscitation attempts   No tube feeds if cognitively unaware      Caffeine OEU:MPNTIR decaf   Right handed    Social Determinants of Health   Financial Resource Strain:   . Difficulty of Paying Living Expenses: Not on file  Food Insecurity:   . Worried About Programme researcher, broadcasting/film/video in the Last Year: Not on file  . Ran Out of Food in the Last Year: Not on file  Transportation Needs:   . Lack of Transportation (Medical): Not on file  . Lack of Transportation (Non-Medical): Not on file  Physical Activity:   . Days of Exercise per Week: Not on file  . Minutes of Exercise per Session: Not on file  Stress:   . Feeling of Stress : Not on file  Social Connections:   . Frequency of Communication with Friends and Family: Not on file  . Frequency of Social Gatherings with Friends and Family: Not on file  . Attends Religious Services: Not on file  . Active Member of Clubs or Organizations: Not on file  . Attends Banker Meetings: Not on file  . Marital Status: Not on file  Intimate Partner Violence:   . Fear of Current or Ex-Partner: Not on file  . Emotionally Abused: Not on file  . Physically Abused: Not on file  . Sexually Abused: Not on file   Review of Systems  Appetite off since taking the J&J COVID vaccine Not clear that there is any weight loss--or perhaps a little      Objective:   Physical Exam Constitutional:      Appearance: Normal appearance.  Musculoskeletal:     Comments: Limited active abduction in right shoulder Able to lift it when in front of her--but still limited Little active rotation  Neurological:     Mental Status: She is alert.            Assessment & Plan:

## 2020-10-08 NOTE — Assessment & Plan Note (Signed)
Considering having surgery---this may be needed to eventually ever get back to any independence in her home Needs hoyer lift for now (or possibly sit to stand lift) to enable transfers in her home. Needs hospital bed to facilitate transfers, prevent vertigo (head needs to stay over 30 degrees) and prevent skin ulcers

## 2020-10-08 NOTE — Assessment & Plan Note (Signed)
May also have muscle injury No longer able to bear weight--but goes back to ortho next week (and hopefully will be able to at least weight bear to assist transfers)

## 2020-10-12 ENCOUNTER — Inpatient Hospital Stay (HOSPITAL_COMMUNITY)
Admission: EM | Admit: 2020-10-12 | Discharge: 2020-10-19 | DRG: 483 | Disposition: A | Discharge status: Skilled Nursing Facility | Payer: Medicare Other | Source: Ambulatory Visit | Attending: Internal Medicine | Admitting: Internal Medicine

## 2020-10-12 ENCOUNTER — Encounter (HOSPITAL_COMMUNITY): Payer: Self-pay | Admitting: Emergency Medicine

## 2020-10-12 ENCOUNTER — Other Ambulatory Visit: Payer: Self-pay

## 2020-10-12 DIAGNOSIS — G8220 Paraplegia, unspecified: Secondary | ICD-10-CM | POA: Diagnosis present

## 2020-10-12 DIAGNOSIS — T148XXA Other injury of unspecified body region, initial encounter: Secondary | ICD-10-CM

## 2020-10-12 DIAGNOSIS — M75101 Unspecified rotator cuff tear or rupture of right shoulder, not specified as traumatic: Secondary | ICD-10-CM | POA: Diagnosis present

## 2020-10-12 DIAGNOSIS — Z20822 Contact with and (suspected) exposure to covid-19: Secondary | ICD-10-CM | POA: Diagnosis present

## 2020-10-12 DIAGNOSIS — Z79899 Other long term (current) drug therapy: Secondary | ICD-10-CM

## 2020-10-12 DIAGNOSIS — N319 Neuromuscular dysfunction of bladder, unspecified: Secondary | ICD-10-CM | POA: Diagnosis present

## 2020-10-12 DIAGNOSIS — W230XXA Caught, crushed, jammed, or pinched between moving objects, initial encounter: Secondary | ICD-10-CM | POA: Diagnosis present

## 2020-10-12 DIAGNOSIS — Z87440 Personal history of urinary (tract) infections: Secondary | ICD-10-CM

## 2020-10-12 DIAGNOSIS — S82302A Unspecified fracture of lower end of left tibia, initial encounter for closed fracture: Principal | ICD-10-CM | POA: Diagnosis present

## 2020-10-12 DIAGNOSIS — E785 Hyperlipidemia, unspecified: Secondary | ICD-10-CM | POA: Diagnosis present

## 2020-10-12 DIAGNOSIS — D62 Acute posthemorrhagic anemia: Secondary | ICD-10-CM | POA: Diagnosis not present

## 2020-10-12 DIAGNOSIS — K5900 Constipation, unspecified: Secondary | ICD-10-CM | POA: Diagnosis not present

## 2020-10-12 DIAGNOSIS — I341 Nonrheumatic mitral (valve) prolapse: Secondary | ICD-10-CM | POA: Diagnosis present

## 2020-10-12 DIAGNOSIS — G35 Multiple sclerosis: Secondary | ICD-10-CM | POA: Diagnosis not present

## 2020-10-12 DIAGNOSIS — Z09 Encounter for follow-up examination after completed treatment for conditions other than malignant neoplasm: Secondary | ICD-10-CM

## 2020-10-12 DIAGNOSIS — M25711 Osteophyte, right shoulder: Secondary | ICD-10-CM | POA: Diagnosis present

## 2020-10-12 DIAGNOSIS — Z9079 Acquired absence of other genital organ(s): Secondary | ICD-10-CM

## 2020-10-12 DIAGNOSIS — Z90710 Acquired absence of both cervix and uterus: Secondary | ICD-10-CM

## 2020-10-12 DIAGNOSIS — S82209A Unspecified fracture of shaft of unspecified tibia, initial encounter for closed fracture: Secondary | ICD-10-CM | POA: Diagnosis present

## 2020-10-12 DIAGNOSIS — Z90722 Acquired absence of ovaries, bilateral: Secondary | ICD-10-CM

## 2020-10-12 DIAGNOSIS — H814 Vertigo of central origin: Secondary | ICD-10-CM | POA: Diagnosis present

## 2020-10-12 DIAGNOSIS — M810 Age-related osteoporosis without current pathological fracture: Secondary | ICD-10-CM | POA: Diagnosis present

## 2020-10-12 DIAGNOSIS — M659 Synovitis and tenosynovitis, unspecified: Secondary | ICD-10-CM | POA: Diagnosis present

## 2020-10-12 DIAGNOSIS — S82409A Unspecified fracture of shaft of unspecified fibula, initial encounter for closed fracture: Secondary | ICD-10-CM | POA: Diagnosis present

## 2020-10-12 DIAGNOSIS — Z888 Allergy status to other drugs, medicaments and biological substances status: Secondary | ICD-10-CM

## 2020-10-12 DIAGNOSIS — Z993 Dependence on wheelchair: Secondary | ICD-10-CM

## 2020-10-12 DIAGNOSIS — K592 Neurogenic bowel, not elsewhere classified: Secondary | ICD-10-CM | POA: Diagnosis present

## 2020-10-12 DIAGNOSIS — S82202A Unspecified fracture of shaft of left tibia, initial encounter for closed fracture: Secondary | ICD-10-CM

## 2020-10-12 NOTE — ED Triage Notes (Signed)
Pt send by  Dr. Eulah Pont for admission to the hospital for distal Tib/fib fax. Pt has a cast on waiting to have surgery.

## 2020-10-12 NOTE — ED Notes (Signed)
Patient assisted with bed pan use, had large formed brown BM. Brief placed per pt request. Son at bedside. Pt updated on plan of care, verbalized understanding of same.

## 2020-10-12 NOTE — ED Provider Notes (Addendum)
[facility] EMERGENCY DEPARTMENT Provider Note   CSN: [id number] Arrival date & time: 10/12/20  1746     History Chief Complaint  Patient presents with  . Leg Injury    [name] is a 74 y.o. female.  HPI Patient was sent directly from Dr. Greig Right office orthopedics for admission.  Patient sustained a left tib-fib fracture.  Fracture was casted and wrapped at orthopedics office today.  She presents with prescription requesting admission to hospitalist service with anticipated surgical managed by Dr. Jena Gauss Saturday or Sunday.  Patient is wheelchair-bound due to MS.  Her left leg had gotten caught in the wheelchair in her son did not recognize that.  He did continue to wheel her causing the leg to twist and fracture.  No other associated injuries.    Past Medical History:  Diagnosis Date  . Ankle fracture, left 07/2006  . Ankle fracture, right 10/30/06   [facility]  . Compression fracture 2011   Dr. Dion Saucier   . Disc herniation    L5, S1  . MS (multiple sclerosis) (HCC)    evaluated by Dr. Sandria Manly  . MVP (mitral valve prolapse) 1979   ECHO. Trace MR, trace TR, trace PR 07/17/03  . Nephrolithiasis   . Osteoporosis   . Rib fractures 1998, [id number]   2  . Toe fracture 1998   5    Patient Active Problem List   Diagnosis Date Noted  . Sprain of left ankle 10/08/2020  . Rotator cuff tear arthropathy of right shoulder 10/08/2020  . Low back pain 04/05/2020  . 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  . Gait disturbance 04/03/2020  . Dysconjugate gaze 04/03/2020  . Vision disturbance 04/03/2020  . Nosebleed 03/12/2020  . Benign paroxysmal positional vertigo due to bilateral vestibular disorder 12/23/2019  . Borderline blood pressure   . Labile blood pressure   . Vertigo   . Hypophosphatemia   . Acute blood loss anemia   . Vertigo as late effect of stroke   . Neurogenic bowel   . Neurogenic bladder   . Multiple sclerosis exacerbation (HCC) 11/27/2019  .  Pressure injury of skin 11/27/2019  . Acute pain of left wrist 10/06/2019  . Advance directive discussed with patient 05/17/2018  . Paraplegia, unspecified (HCC) 07/02/2017  . Recurrent UTI 07/02/2017  . Right leg swelling 08/26/2016  . Cervical spinal stenosis 06/12/2016  . 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 Health care maintenance 10/18/2012  . Osteoporosis   . Nephrolithiasis   . Dyslipidemia 05/25/2008  . Multiple sclerosis (HCC) 05/24/2008  . MITRAL VALVE PROLAPSE, HX OF 05/24/2008    Past Surgical History:  Procedure Laterality Date  . LUMBAR DISC SURGERY  04/2007   L5, S1- [facility]  . SEPTOPLASTY  1983   deviated septum  . TONSILLECTOMY AND ADENOIDECTOMY  1960  . TOTAL ABDOMINAL HYSTERECTOMY W/ BILATERAL SALPINGOOPHORECTOMY  1982   for endometriosis     OB History   No obstetric history on file.     Family History  Problem Relation Age of Onset  . Stroke Mother   . Neuropathy Brother   . Stroke Father   . Hypertension Neg Hx   . Diabetes Neg Hx   . Depression Neg Hx   . Alcohol abuse Neg Hx   . Drug abuse Neg Hx     Social History   Tobacco Use  . Smoking status: Never Smoker  . Smokeless tobacco: Never Used  Substance Use Topics  . Alcohol  use: No  . Drug use: No    Home Medications Prior to Admission medications   Medication Sig Start Date End Date Taking? Authorizing Provider  cyanocobalamin (,VITAMIN B-12,) 1000 MCG/ML injection Inject 1,000 mcg into the muscle once. 09/05/20   [provider]  methenamine (HIPREX) 1 g tablet Take 1 tablet (1 g total) by mouth daily. Patient taking differently: Take 1 g by mouth every other day.  12/14/19   Angiulli, Mcarthur Rossetti, PA-C  oxybutynin (DITROPAN) 5 MG tablet Take 1/2  To 1 pill once or twice a day 07/03/20   Sater, Pearletha Furl, MD    Allergies    Procaine hcl  Review of Systems   Review of Systems 10 systems reviewed and negative except as per HPI Physical Exam Updated Vital Signs BP  (!) 145/75 (BP Location: Left Arm)   Pulse 79   Temp 98.7 F (37.1 C) (Oral)   Resp 16   Ht 5\' 3"  (1.6 m)   Wt 51.3 kg   SpO2 99%   BMI 20.03 kg/m   Physical Exam Constitutional:      Comments: Patient is alert with clear mental status.  No respiratory distress.  HENT:     Head: Normocephalic and atraumatic.     Mouth/Throat:     Pharynx: Oropharynx is clear.  Eyes:     Extraocular Movements: Extraocular movements intact.  Cardiovascular:     Rate and Rhythm: Normal rate and regular rhythm.  Pulmonary:     Effort: Pulmonary effort is normal.     Breath sounds: Normal breath sounds.  Abdominal:     General: There is no distension.     Palpations: Abdomen is soft.     Tenderness: There is no abdominal tenderness. There is no guarding.  Musculoskeletal:     Comments: Patient is wearing a short leg splint on the left lower extremity.  Toes are warm and dry.  Right leg without injury.  Neurological:     Comments: Patient is alert.  Cognitive function intact.  At baseline, patient has neurologic dysfunction associated with MS and is paraplegic and wheelchair-bound.  Psychiatric:        Mood and Affect: Mood normal.     ED Results / Procedures / Treatments   Labs (all labs ordered are listed, but only abnormal results are displayed) Labs Reviewed  BASIC METABOLIC PANEL  CBC  PROTIME-INR  TYPE AND SCREEN    EKG None  Radiology No results found.  Procedures Procedures (including critical care time)  Medications Ordered in ED Medications - No data to display  ED Course  I have reviewed the triage vital signs and the nursing notes.  Pertinent labs & imaging results that were available during my care of the patient were reviewed by me and considered in my medical decision making (see chart for details).  Clinical Course as of Oct 14 1417  Sat Oct 13, 2020  0040 Consult: [facility] hospitalist Dr. Oct 15, 2020 for admission.   [MP]    Clinical Course User Index [MP]  Loney Loh, MD   MDM Rules/Calculators/A&P                          Patient is referred to the emergency department by Dr. Arby Barrette of orthopedics for admission to hospitalist service.  Anticipated surgical management of tibia-fibula fracture left tomorrow or the next day by Dr. Eulah Pont.  No other injury associated.  Patient at baseline has MS and  is wheelchair bound.  She is otherwise alert and well today.  She is in no distress.  She has clear mental status and no other signs of acute illness.  Will initiate basic lab work and EKG for admission. Final Clinical Impression(s) / ED Diagnoses Final diagnoses:  Tibia/fibula fracture, left, closed, initial encounter  Multiple sclerosis [facility])    Rx / DC Orders ED Discharge Orders    None       Arby Barrette, MD 10/12/20 2248    Arby Barrette, MD 10/14/20 1418

## 2020-10-13 ENCOUNTER — Inpatient Hospital Stay (HOSPITAL_COMMUNITY): Payer: Medicare Other

## 2020-10-13 ENCOUNTER — Encounter (HOSPITAL_COMMUNITY)
Admission: EM | Disposition: A | Discharge status: Skilled Nursing Facility | Payer: Self-pay | Source: Ambulatory Visit | Attending: Internal Medicine

## 2020-10-13 ENCOUNTER — Inpatient Hospital Stay (HOSPITAL_COMMUNITY): Payer: Medicare Other | Admitting: Certified Registered Nurse Anesthetist

## 2020-10-13 DIAGNOSIS — E785 Hyperlipidemia, unspecified: Secondary | ICD-10-CM | POA: Diagnosis present

## 2020-10-13 DIAGNOSIS — K5900 Constipation, unspecified: Secondary | ICD-10-CM | POA: Diagnosis not present

## 2020-10-13 DIAGNOSIS — S82409A Unspecified fracture of shaft of unspecified fibula, initial encounter for closed fracture: Secondary | ICD-10-CM | POA: Diagnosis present

## 2020-10-13 DIAGNOSIS — Z9079 Acquired absence of other genital organ(s): Secondary | ICD-10-CM | POA: Diagnosis not present

## 2020-10-13 DIAGNOSIS — S82402A Unspecified fracture of shaft of left fibula, initial encounter for closed fracture: Secondary | ICD-10-CM

## 2020-10-13 DIAGNOSIS — Z90710 Acquired absence of both cervix and uterus: Secondary | ICD-10-CM | POA: Diagnosis not present

## 2020-10-13 DIAGNOSIS — S82302A Unspecified fracture of lower end of left tibia, initial encounter for closed fracture: Secondary | ICD-10-CM | POA: Diagnosis present

## 2020-10-13 DIAGNOSIS — K592 Neurogenic bowel, not elsewhere classified: Secondary | ICD-10-CM | POA: Diagnosis present

## 2020-10-13 DIAGNOSIS — N319 Neuromuscular dysfunction of bladder, unspecified: Secondary | ICD-10-CM | POA: Diagnosis present

## 2020-10-13 DIAGNOSIS — G8220 Paraplegia, unspecified: Secondary | ICD-10-CM

## 2020-10-13 DIAGNOSIS — G35 Multiple sclerosis: Secondary | ICD-10-CM

## 2020-10-13 DIAGNOSIS — I341 Nonrheumatic mitral (valve) prolapse: Secondary | ICD-10-CM | POA: Diagnosis present

## 2020-10-13 DIAGNOSIS — S82202A Unspecified fracture of shaft of left tibia, initial encounter for closed fracture: Secondary | ICD-10-CM

## 2020-10-13 DIAGNOSIS — Z79899 Other long term (current) drug therapy: Secondary | ICD-10-CM | POA: Diagnosis not present

## 2020-10-13 DIAGNOSIS — Z993 Dependence on wheelchair: Secondary | ICD-10-CM | POA: Diagnosis not present

## 2020-10-13 DIAGNOSIS — W230XXA Caught, crushed, jammed, or pinched between moving objects, initial encounter: Secondary | ICD-10-CM | POA: Diagnosis present

## 2020-10-13 DIAGNOSIS — M75101 Unspecified rotator cuff tear or rupture of right shoulder, not specified as traumatic: Secondary | ICD-10-CM | POA: Diagnosis present

## 2020-10-13 DIAGNOSIS — Z90722 Acquired absence of ovaries, bilateral: Secondary | ICD-10-CM | POA: Diagnosis not present

## 2020-10-13 DIAGNOSIS — D62 Acute posthemorrhagic anemia: Secondary | ICD-10-CM | POA: Diagnosis not present

## 2020-10-13 DIAGNOSIS — Z888 Allergy status to other drugs, medicaments and biological substances status: Secondary | ICD-10-CM | POA: Diagnosis not present

## 2020-10-13 DIAGNOSIS — M659 Synovitis and tenosynovitis, unspecified: Secondary | ICD-10-CM | POA: Diagnosis present

## 2020-10-13 DIAGNOSIS — S82209A Unspecified fracture of shaft of unspecified tibia, initial encounter for closed fracture: Secondary | ICD-10-CM | POA: Diagnosis present

## 2020-10-13 DIAGNOSIS — Z20822 Contact with and (suspected) exposure to covid-19: Secondary | ICD-10-CM | POA: Diagnosis present

## 2020-10-13 DIAGNOSIS — M25711 Osteophyte, right shoulder: Secondary | ICD-10-CM | POA: Diagnosis present

## 2020-10-13 DIAGNOSIS — Z87440 Personal history of urinary (tract) infections: Secondary | ICD-10-CM | POA: Diagnosis not present

## 2020-10-13 DIAGNOSIS — M810 Age-related osteoporosis without current pathological fracture: Secondary | ICD-10-CM | POA: Diagnosis present

## 2020-10-13 DIAGNOSIS — H814 Vertigo of central origin: Secondary | ICD-10-CM | POA: Diagnosis present

## 2020-10-13 HISTORY — PX: OPEN REDUCTION INTERNAL FIXATION (ORIF) TIBIA/FIBULA FRACTURE: SHX5992

## 2020-10-13 LAB — BASIC METABOLIC PANEL
Anion gap: 13 (ref 5–15)
BUN: 16 mg/dL (ref 8–23)
CO2: 25 mmol/L (ref 22–32)
Calcium: 9.5 mg/dL (ref 8.9–10.3)
Chloride: 104 mmol/L (ref 98–111)
Creatinine, Ser: 0.68 mg/dL (ref 0.44–1.00)
GFR, Estimated: >60 mL/min (ref >60)
Glucose, Bld: 117 mg/dL — ABNORMAL HIGH (ref 70–99)
Potassium: 4.2 mmol/L (ref 3.5–5.1)
Sodium: 142 mmol/L (ref 135–145)

## 2020-10-13 LAB — CBC
HCT: 33.5 % — ABNORMAL LOW (ref 36.0–46.0)
HCT: 43.4 % (ref 36.0–46.0)
Hemoglobin: 10.8 g/dL — ABNORMAL LOW (ref 12.0–15.0)
Hemoglobin: 13.7 g/dL (ref 12.0–15.0)
MCH: 30.3 pg (ref 26.0–34.0)
MCH: 30.4 pg (ref 26.0–34.0)
MCHC: 31.6 g/dL (ref 30.0–36.0)
MCHC: 32.2 g/dL (ref 30.0–36.0)
MCV: 93.8 fL (ref 80.0–100.0)
MCV: 96.2 fL (ref 80.0–100.0)
Platelets: 444 10*3/uL — ABNORMAL HIGH (ref 150–400)
Platelets: 548 10*3/uL — ABNORMAL HIGH (ref 150–400)
RBC: 3.57 MIL/uL — ABNORMAL LOW (ref 3.87–5.11)
RBC: 4.51 MIL/uL (ref 3.87–5.11)
RDW: 11.3 % — ABNORMAL LOW (ref 11.5–15.5)
RDW: 11.6 % (ref 11.5–15.5)
WBC: 10.8 10*3/uL — ABNORMAL HIGH (ref 4.0–10.5)
WBC: 8.1 10*3/uL (ref 4.0–10.5)
nRBC: 0 % (ref 0.0–0.2)
nRBC: 0 % (ref 0.0–0.2)

## 2020-10-13 LAB — PROTIME-INR
INR: 1 (ref 0.8–1.2)
Prothrombin Time: 12.4 seconds (ref 11.4–15.2)

## 2020-10-13 LAB — TYPE AND SCREEN
ABO/RH(D): B POS
Antibody Screen: NEGATIVE

## 2020-10-13 LAB — CREATININE, SERUM
Creatinine, Ser: 0.78 mg/dL (ref 0.44–1.00)
GFR, Estimated: >60 mL/min (ref >60)

## 2020-10-13 LAB — RESPIRATORY PANEL BY RT PCR (FLU A&B, COVID)
Influenza A by PCR: NEGATIVE
Influenza B by PCR: NEGATIVE
SARS Coronavirus 2 by RT PCR: NEGATIVE

## 2020-10-13 LAB — MRSA PCR SCREENING: MRSA by PCR: NEGATIVE

## 2020-10-13 SURGERY — OPEN REDUCTION INTERNAL FIXATION (ORIF) TIBIA/FIBULA FRACTURE
Anesthesia: General | Laterality: Left

## 2020-10-13 MED ORDER — ACETAMINOPHEN 650 MG RE SUPP: 650.0000 mg | Freq: Four times a day (QID) | RECTAL | Status: DC | PRN

## 2020-10-13 MED ORDER — ONDANSETRON HCL 4 MG/2ML IJ SOLN
INTRAMUSCULAR | Status: AC
  Filled 2020-10-13: qty 2

## 2020-10-13 MED ORDER — BACITRACIN ZINC 500 UNIT/GM EX OINT
TOPICAL_OINTMENT | CUTANEOUS | Status: AC
  Filled 2020-10-13: qty 28.35

## 2020-10-13 MED ORDER — ENOXAPARIN SODIUM 40 MG/0.4ML ~~LOC~~ SOLN: 40.0000 mg | SUBCUTANEOUS | Status: DC

## 2020-10-13 MED ORDER — ONDANSETRON HCL 4 MG/2ML IJ SOLN: 4.0000 mg | Freq: Four times a day (QID) | INTRAMUSCULAR | Status: DC | PRN

## 2020-10-13 MED ORDER — MIDAZOLAM HCL 2 MG/2ML IJ SOLN
INTRAMUSCULAR | Status: AC
  Filled 2020-10-13: qty 2

## 2020-10-13 MED ORDER — HYDROCODONE-ACETAMINOPHEN 5-325 MG PO TABS
1.0000 | ORAL_TABLET | ORAL | Status: DC | PRN
  Administered 2020-10-16: 1 via ORAL
  Administered 2020-10-16: 2 via ORAL
  Filled 2020-10-13: qty 1
  Filled 2020-10-13: qty 2
  Filled 2020-10-13: qty 1

## 2020-10-13 MED ORDER — ROCURONIUM BROMIDE 10 MG/ML (PF) SYRINGE
PREFILLED_SYRINGE | INTRAVENOUS | Status: DC | PRN
  Administered 2020-10-13: 30 mg via INTRAVENOUS

## 2020-10-13 MED ORDER — FENTANYL CITRATE (PF) 250 MCG/5ML IJ SOLN
INTRAMUSCULAR | Status: AC
  Filled 2020-10-13: qty 5

## 2020-10-13 MED ORDER — MIDAZOLAM HCL 2 MG/2ML IJ SOLN
INTRAMUSCULAR | Status: DC | PRN
  Administered 2020-10-13: 1 mg via INTRAVENOUS

## 2020-10-13 MED ORDER — ENOXAPARIN SODIUM 40 MG/0.4ML ~~LOC~~ SOLN
40.0000 mg | SUBCUTANEOUS | Status: DC
  Administered 2020-10-14 – 2020-10-17 (×3): 40 mg via SUBCUTANEOUS
  Filled 2020-10-13 (×3): qty 0.4

## 2020-10-13 MED ORDER — BUPIVACAINE HCL (PF) 0.5 % IJ SOLN
INTRAMUSCULAR | Status: AC
  Filled 2020-10-13: qty 30

## 2020-10-13 MED ORDER — ACETAMINOPHEN 325 MG PO TABS: 650.0000 mg | ORAL_TABLET | Freq: Four times a day (QID) | ORAL | Status: DC | PRN

## 2020-10-13 MED ORDER — HYDROCODONE-ACETAMINOPHEN 7.5-325 MG PO TABS
1.0000 | ORAL_TABLET | ORAL | Status: DC | PRN
  Administered 2020-10-17 (×2): 2 via ORAL
  Filled 2020-10-13 (×2): qty 2

## 2020-10-13 MED ORDER — ACETAMINOPHEN 10 MG/ML IV SOLN
INTRAVENOUS | Status: DC | PRN
  Administered 2020-10-13: 1000 mg via INTRAVENOUS

## 2020-10-13 MED ORDER — ACETAMINOPHEN 10 MG/ML IV SOLN
INTRAVENOUS | Status: AC
  Filled 2020-10-13: qty 100

## 2020-10-13 MED ORDER — FENTANYL CITRATE (PF) 250 MCG/5ML IJ SOLN
INTRAMUSCULAR | Status: DC | PRN
  Administered 2020-10-13 (×3): 50 ug via INTRAVENOUS

## 2020-10-13 MED ORDER — CHLORHEXIDINE GLUCONATE 0.12 % MT SOLN
OROMUCOSAL | Status: AC
  Administered 2020-10-13: 15 mL via OROMUCOSAL
  Filled 2020-10-13: qty 15

## 2020-10-13 MED ORDER — ALBUMIN HUMAN 5 % IV SOLN: INTRAVENOUS | Status: DC | PRN

## 2020-10-13 MED ORDER — POTASSIUM CHLORIDE IN NACL 20-0.9 MEQ/L-% IV SOLN
INTRAVENOUS | Status: DC
  Filled 2020-10-13 (×4): qty 1000

## 2020-10-13 MED ORDER — OXYBUTYNIN CHLORIDE 5 MG PO TABS
2.5000 mg | ORAL_TABLET | Freq: Every day | ORAL | Status: DC
  Filled 2020-10-13 (×2): qty 0.5

## 2020-10-13 MED ORDER — PROPOFOL 10 MG/ML IV BOLUS
INTRAVENOUS | Status: DC | PRN
  Administered 2020-10-13: 100 mg via INTRAVENOUS

## 2020-10-13 MED ORDER — DOCUSATE SODIUM 100 MG PO CAPS
100.0000 mg | ORAL_CAPSULE | Freq: Two times a day (BID) | ORAL | Status: DC
  Administered 2020-10-13 – 2020-10-17 (×5): 100 mg via ORAL
  Filled 2020-10-13 (×7): qty 1

## 2020-10-13 MED ORDER — DEXAMETHASONE SODIUM PHOSPHATE 10 MG/ML IJ SOLN
INTRAMUSCULAR | Status: AC
  Filled 2020-10-13: qty 1

## 2020-10-13 MED ORDER — ONDANSETRON HCL 4 MG PO TABS: 4.0000 mg | ORAL_TABLET | Freq: Four times a day (QID) | ORAL | Status: DC | PRN

## 2020-10-13 MED ORDER — CHLORHEXIDINE GLUCONATE 0.12 % MT SOLN: 15.0000 mL | Freq: Once | OROMUCOSAL | Status: AC

## 2020-10-13 MED ORDER — PHENYLEPHRINE HCL-NACL 10-0.9 MG/250ML-% IV SOLN
INTRAVENOUS | Status: DC | PRN
  Administered 2020-10-13: 25 ug/min via INTRAVENOUS

## 2020-10-13 MED ORDER — CEFAZOLIN SODIUM-DEXTROSE 2-4 GM/100ML-% IV SOLN
2.0000 g | Freq: Three times a day (TID) | INTRAVENOUS | Status: AC
  Administered 2020-10-13 – 2020-10-14 (×3): 2 g via INTRAVENOUS
  Filled 2020-10-13 (×3): qty 100

## 2020-10-13 MED ORDER — ONDANSETRON HCL 4 MG/2ML IJ SOLN: 4.0000 mg | Freq: Once | INTRAMUSCULAR | Status: DC | PRN

## 2020-10-13 MED ORDER — PHENYLEPHRINE 40 MCG/ML (10ML) SYRINGE FOR IV PUSH (FOR BLOOD PRESSURE SUPPORT)
PREFILLED_SYRINGE | INTRAVENOUS | Status: AC
  Filled 2020-10-13: qty 20

## 2020-10-13 MED ORDER — EPHEDRINE SULFATE 50 MG/ML IJ SOLN
INTRAMUSCULAR | Status: DC | PRN
  Administered 2020-10-13: 5 mg via INTRAVENOUS

## 2020-10-13 MED ORDER — METHENAMINE MANDELATE 1 G PO TABS
1.0000 g | ORAL_TABLET | ORAL | Status: DC
  Administered 2020-10-13 – 2020-10-19 (×3): 1 g via ORAL
  Filled 2020-10-13 (×5): qty 1

## 2020-10-13 MED ORDER — CEFAZOLIN SODIUM-DEXTROSE 2-4 GM/100ML-% IV SOLN
2.0000 g | INTRAVENOUS | Status: AC
  Administered 2020-10-13: 2 g via INTRAVENOUS
  Filled 2020-10-13: qty 100

## 2020-10-13 MED ORDER — LACTATED RINGERS IV SOLN: INTRAVENOUS | Status: DC

## 2020-10-13 MED ORDER — METOCLOPRAMIDE HCL 5 MG/ML IJ SOLN: 5.0000 mg | Freq: Three times a day (TID) | INTRAMUSCULAR | Status: DC | PRN

## 2020-10-13 MED ORDER — PROPOFOL 10 MG/ML IV BOLUS
INTRAVENOUS | Status: AC
  Filled 2020-10-13: qty 20

## 2020-10-13 MED ORDER — METOCLOPRAMIDE HCL 5 MG PO TABS: 5.0000 mg | ORAL_TABLET | Freq: Three times a day (TID) | ORAL | Status: DC | PRN

## 2020-10-13 MED ORDER — LIDOCAINE 2% (20 MG/ML) 5 ML SYRINGE
INTRAMUSCULAR | Status: AC
  Filled 2020-10-13: qty 5

## 2020-10-13 MED ORDER — METHOCARBAMOL 500 MG PO TABS: 500.0000 mg | ORAL_TABLET | Freq: Four times a day (QID) | ORAL | Status: DC | PRN

## 2020-10-13 MED ORDER — VANCOMYCIN HCL 1000 MG IV SOLR
INTRAVENOUS | Status: DC | PRN
  Administered 2020-10-13: 1000 mg

## 2020-10-13 MED ORDER — METHOCARBAMOL 1000 MG/10ML IJ SOLN
500.0000 mg | Freq: Four times a day (QID) | INTRAVENOUS | Status: DC | PRN
  Filled 2020-10-13: qty 5

## 2020-10-13 MED ORDER — MORPHINE SULFATE (PF) 2 MG/ML IV SOLN: 0.5000 mg | INTRAVENOUS | Status: DC | PRN

## 2020-10-13 MED ORDER — VANCOMYCIN HCL 1000 MG IV SOLR
INTRAVENOUS | Status: AC
  Filled 2020-10-13: qty 1000

## 2020-10-13 MED ORDER — SODIUM CHLORIDE 0.9 % IV SOLN: INTRAVENOUS | Status: DC

## 2020-10-13 MED ORDER — MORPHINE SULFATE (PF) 2 MG/ML IV SOLN: 1.0000 mg | INTRAVENOUS | Status: DC | PRN

## 2020-10-13 MED ORDER — POLYETHYLENE GLYCOL 3350 17 G PO PACK: 17.0000 g | PACK | Freq: Every day | ORAL | Status: DC | PRN

## 2020-10-13 MED ORDER — ONDANSETRON HCL 4 MG/2ML IJ SOLN
INTRAMUSCULAR | Status: DC | PRN
  Administered 2020-10-13: 4 mg via INTRAVENOUS

## 2020-10-13 MED ORDER — FENTANYL CITRATE (PF) 100 MCG/2ML IJ SOLN: 25.0000 ug | INTRAMUSCULAR | Status: DC | PRN

## 2020-10-13 MED ORDER — PHENYLEPHRINE HCL (PRESSORS) 10 MG/ML IV SOLN
INTRAVENOUS | Status: DC | PRN
  Administered 2020-10-13: 120 ug via INTRAVENOUS

## 2020-10-13 MED ORDER — ACETAMINOPHEN 10 MG/ML IV SOLN: 1000.0000 mg | Freq: Once | INTRAVENOUS | Status: DC | PRN

## 2020-10-13 MED ORDER — SUGAMMADEX SODIUM 200 MG/2ML IV SOLN
INTRAVENOUS | Status: DC | PRN
  Administered 2020-10-13: 150 mg via INTRAVENOUS

## 2020-10-13 MED ORDER — DEXAMETHASONE SODIUM PHOSPHATE 10 MG/ML IJ SOLN
INTRAMUSCULAR | Status: DC | PRN
  Administered 2020-10-13: 10 mg via INTRAVENOUS

## 2020-10-13 MED ORDER — MUPIROCIN 2 % EX OINT
1.0000 "application " | TOPICAL_OINTMENT | Freq: Two times a day (BID) | CUTANEOUS | Status: AC
  Administered 2020-10-13 – 2020-10-17 (×7): 1 via NASAL
  Filled 2020-10-13 (×3): qty 22

## 2020-10-13 MED ORDER — ACETAMINOPHEN 325 MG PO TABS: 325.0000 mg | ORAL_TABLET | Freq: Four times a day (QID) | ORAL | Status: DC | PRN

## 2020-10-13 SURGICAL SUPPLY — 71 items
APL PRP STRL LF DISP 70% ISPRP (MISCELLANEOUS) ×1
BANDAGE ESMARK 6X9 LF (GAUZE/BANDAGES/DRESSINGS) ×1 IMPLANT
BIT DRILL 2.5X2.75 QC CALB (BIT) ×1 IMPLANT
BIT DRILL CALIBRATED 2.7 (BIT) ×1 IMPLANT
BNDG CMPR 9X6 STRL LF SNTH (GAUZE/BANDAGES/DRESSINGS) ×1
BNDG COHESIVE 4X5 TAN STRL (GAUZE/BANDAGES/DRESSINGS) ×2 IMPLANT
BNDG ELASTIC 4X5.8 VLCR STR LF (GAUZE/BANDAGES/DRESSINGS) ×1 IMPLANT
BNDG ELASTIC 6X5.8 VLCR STR LF (GAUZE/BANDAGES/DRESSINGS) IMPLANT
BNDG ESMARK 6X9 LF (GAUZE/BANDAGES/DRESSINGS) ×2
BRUSH SCRUB EZ PLAIN DRY (MISCELLANEOUS) ×4 IMPLANT
CHLORAPREP W/TINT 26 (MISCELLANEOUS) ×2 IMPLANT
COVER SURGICAL LIGHT HANDLE (MISCELLANEOUS) ×2 IMPLANT
COVER WAND RF STERILE (DRAPES) ×2 IMPLANT
DRAPE C-ARM 42X72 X-RAY (DRAPES) ×2 IMPLANT
DRAPE C-ARMOR (DRAPES) ×2 IMPLANT
DRAPE ORTHO SPLIT 77X108 STRL (DRAPES) ×4
DRAPE SURG ORHT 6 SPLT 77X108 (DRAPES) ×2 IMPLANT
DRAPE U-SHAPE 47X51 STRL (DRAPES) ×2 IMPLANT
DRSG ADAPTIC 3X8 NADH LF (GAUZE/BANDAGES/DRESSINGS) ×1 IMPLANT
ELECT REM PT RETURN 9FT ADLT (ELECTROSURGICAL) ×2
ELECTRODE REM PT RTRN 9FT ADLT (ELECTROSURGICAL) ×1 IMPLANT
GAUZE SPONGE 4X4 12PLY STRL (GAUZE/BANDAGES/DRESSINGS) ×1 IMPLANT
GLOVE BIO SURGEON STRL SZ 6.5 (GLOVE) ×6 IMPLANT
GLOVE BIO SURGEON STRL SZ7.5 (GLOVE) ×6 IMPLANT
GLOVE BIOGEL PI IND STRL 6.5 (GLOVE) ×1 IMPLANT
GLOVE BIOGEL PI IND STRL 7.5 (GLOVE) ×1 IMPLANT
GLOVE BIOGEL PI INDICATOR 6.5 (GLOVE) ×1
GLOVE BIOGEL PI INDICATOR 7.5 (GLOVE) ×1
GOWN STRL REUS W/ TWL LRG LVL3 (GOWN DISPOSABLE) ×2 IMPLANT
GOWN STRL REUS W/TWL LRG LVL3 (GOWN DISPOSABLE) ×4
K-WIRE ACE 1.6X6 (WIRE) ×2
KIT TURNOVER KIT B (KITS) ×2 IMPLANT
KWIRE ACE 1.6X6 (WIRE) IMPLANT
MANIFOLD NEPTUNE II (INSTRUMENTS) ×2 IMPLANT
NDL HYPO 21X1.5 SAFETY (NEEDLE) IMPLANT
NDL HYPO 25GX1X1/2 BEV (NEEDLE) ×1 IMPLANT
NEEDLE HYPO 21X1.5 SAFETY (NEEDLE) IMPLANT
NEEDLE HYPO 25GX1X1/2 BEV (NEEDLE) ×2 IMPLANT
NS IRRIG 1000ML POUR BTL (IV SOLUTION) ×2 IMPLANT
PACK TOTAL JOINT (CUSTOM PROCEDURE TRAY) ×2 IMPLANT
PAD ARMBOARD 7.5X6 YLW CONV (MISCELLANEOUS) ×4 IMPLANT
PAD CAST 4YDX4 CTTN HI CHSV (CAST SUPPLIES) IMPLANT
PADDING CAST COTTON 4X4 STRL (CAST SUPPLIES) ×2
PADDING CAST COTTON 6X4 STRL (CAST SUPPLIES) IMPLANT
PLATE 9H 184 LT MED DIST TIB (Plate) ×1 IMPLANT
SCREW CORT 3.5X26 (Screw) ×2 IMPLANT
SCREW CORT T15 26X3.5XST LCK (Screw) IMPLANT
SCREW LOCK CORT STAR 3.5X22 (Screw) ×3 IMPLANT
SCREW LOCK CORT STAR 3.5X32 (Screw) ×2 IMPLANT
SCREW LOCK CORT STAR 3.5X34 (Screw) ×1 IMPLANT
SCREW LOCK CORT STAR 3.5X36 (Screw) ×1 IMPLANT
SCREW LOCK CORT STAR 3.5X40 (Screw) ×1 IMPLANT
SPLINT PLASTER CAST XFAST 5X30 (CAST SUPPLIES) IMPLANT
SPLINT PLASTER XFAST SET 5X30 (CAST SUPPLIES) ×3
SPONGE LAP 18X18 RF (DISPOSABLE) IMPLANT
STAPLER VISISTAT 35W (STAPLE) ×2 IMPLANT
SUCTION FRAZIER HANDLE 10FR (MISCELLANEOUS) ×2
SUCTION TUBE FRAZIER 10FR DISP (MISCELLANEOUS) ×1 IMPLANT
SUT ETHILON 3 0 PS 1 (SUTURE) ×4 IMPLANT
SUT MNCRL AB 3-0 PS2 27 (SUTURE) ×1 IMPLANT
SUT PROLENE 0 CT (SUTURE) IMPLANT
SUT VIC AB 0 CT1 27 (SUTURE) ×2
SUT VIC AB 0 CT1 27XBRD ANBCTR (SUTURE) ×1 IMPLANT
SUT VIC AB 2-0 CT1 27 (SUTURE) ×4
SUT VIC AB 2-0 CT1 TAPERPNT 27 (SUTURE) ×2 IMPLANT
SUT VIC AB 2-0 CTB1 (SUTURE) ×1 IMPLANT
SYR CONTROL 10ML LL (SYRINGE) ×2 IMPLANT
TOWEL GREEN STERILE (TOWEL DISPOSABLE) ×4 IMPLANT
TOWEL GREEN STERILE FF (TOWEL DISPOSABLE) ×2 IMPLANT
UNDERPAD 30X36 HEAVY ABSORB (UNDERPADS AND DIAPERS) ×2 IMPLANT
WATER STERILE IRR 1000ML POUR (IV SOLUTION) ×2 IMPLANT

## 2020-10-13 NOTE — Anesthesia Postprocedure Evaluation (Signed)
Anesthesia Post Note  Patient: [name]  Procedure(s) Performed: OPEN REDUCTION INTERNAL FIXATION (ORIF) TIBIA/FIBULA FRACTURE (Left )     Patient location during evaluation: PACU Anesthesia Type: General Level of consciousness: awake Pain management: pain level controlled Vital Signs Assessment: post-procedure vital signs reviewed and stable Respiratory status: spontaneous breathing, nonlabored ventilation, respiratory function stable and patient connected to nasal cannula oxygen Cardiovascular status: blood pressure returned to baseline and stable Postop Assessment: no apparent nausea or vomiting Anesthetic complications: no   No complications documented.  Last Vitals:  Vitals:   10/13/20 1354 10/13/20 1442  BP: 101/66 108/65  Pulse: 75 80  Resp: 12 16  Temp: 36.7 C 36.4 C  SpO2: 95% 97%    Last Pain:  Vitals:   10/13/20 1442  TempSrc: Oral  PainSc:                  [name]

## 2020-10-13 NOTE — H&P (Signed)
History and Physical    [name] KXF:[id number] DOB: 1945-12-19 DOA: 10/12/2020  PCP: Karie Schwalbe, MD Patient coming from: Orthopedics office  Chief Complaint: Left tib-fib fracture  HPI: [name] is a 74 y.o. female with medical history significant of MS, MVP sent to the ED by orthopedics for hospital admission.  Patient sustained a left tib-fib fracture.  Fracture was casted and wrapped at orthopedics office today.  Patient was sent to the ED by Dr. Eulah Pont from orthopedics requesting hospital admission with anticipated surgical management by Dr. Jena Gauss either Saturday or Sunday.  Patient is wheelchair-bound due to MS.  She injured her leg after it got caught in the wheelchair and her son did not recognize that.  He continued to wheel her causing the leg to twist and fracture.  No other injuries reported.  Patient reports pain in her left leg.  She has no other complaints.  Denies fevers, chills, cough, shortness of breath, chest pain, nausea, vomiting, abdominal pain, or diarrhea.  ED Course: Hemodynamically stable.  WBC 10.8, hemoglobin 13.7, hematocrit 43.4, platelet 548k.  Platelet count was slightly elevated on labs done 4 months ago as well.  Sodium 142, potassium 4.2, chloride 104, bicarb 25, BUN 16, creatinine 0.6, glucose 117.  INR 1.0.  Screening SARS-CoV-2 PCR test pending.  Review of Systems:  All systems reviewed and apart from history of presenting illness, are negative.  Past Medical History:  Diagnosis Date  . Ankle fracture, left 07/2006  . Ankle fracture, right 10/30/06   [facility]  . Compression fracture 2011   Dr. Dion Saucier   . Disc herniation    L5, S1  . MS (multiple sclerosis) (HCC)    evaluated by Dr. Sandria Manly  . MVP (mitral valve prolapse) 1979   ECHO. Trace MR, trace TR, trace PR 07/17/03  . Nephrolithiasis   . Osteoporosis   . Rib fractures 1998, [id number]   2  . Toe fracture 1998   5    Past Surgical History:  Procedure Laterality Date  .  LUMBAR DISC SURGERY  04/2007   L5, S1- [facility]  . SEPTOPLASTY  1983   deviated septum  . TONSILLECTOMY AND ADENOIDECTOMY  1960  . TOTAL ABDOMINAL HYSTERECTOMY W/ BILATERAL SALPINGOOPHORECTOMY  1982   for endometriosis     reports that she has never smoked. She has never used smokeless tobacco. She reports that she does not drink alcohol and does not use drugs.  Allergies  Allergen Reactions  . Procaine Hcl     REACTION: UNSPECIFIED    Family History  Problem Relation Age of Onset  . Stroke Mother   . Neuropathy Brother   . Stroke Father   . Hypertension Neg Hx   . Diabetes Neg Hx   . Depression Neg Hx   . Alcohol abuse Neg Hx   . Drug abuse Neg Hx     Prior to Admission medications   Medication Sig Start Date End Date Taking? Authorizing Provider  methenamine (HIPREX) 1 g tablet Take 1 tablet (1 g total) by mouth daily. Patient taking differently: Take 1 g by mouth every other day.  12/14/19  Yes Angiulli, Mcarthur Rossetti, PA-C  cyanocobalamin (,VITAMIN B-12,) 1000 MCG/ML injection Inject 1,000 mcg into the muscle once. 09/05/20   [provider]  oxybutynin (DITROPAN) 5 MG tablet Take 1/2  To 1 pill once or twice a day 07/03/20   Asa Lente, MD    Physical Exam: Vitals:   10/12/20 1759  10/12/20 1804 10/12/20 2105 10/12/20 2349  BP:  (!) 126/92 (!) 145/75 126/79  Pulse:  85 79 71  Resp:  16 16 17   Temp:  98.3 F (36.8 C) 98.7 F (37.1 C)   TempSrc:  Oral Oral   SpO2:  98% 99% 98%  Weight: 51.3 kg     Height: 5\' 3"  (1.6 m)       Physical Exam Constitutional:      General: She is not in acute distress. HENT:     Head: Normocephalic and atraumatic.     Mouth/Throat:     Mouth: Mucous membranes are moist.     Pharynx: Oropharynx is clear.  Eyes:     Extraocular Movements: Extraocular movements intact.     Conjunctiva/sclera: Conjunctivae normal.  Cardiovascular:     Rate and Rhythm: Normal rate and regular rhythm.     Pulses: Normal pulses.  Pulmonary:       Effort: Pulmonary effort is normal. No respiratory distress.     Breath sounds: Normal breath sounds. No wheezing or rales.  Abdominal:     General: Bowel sounds are normal. There is no distension.     Palpations: Abdomen is soft.     Tenderness: There is no abdominal tenderness.  Musculoskeletal:     Cervical back: Normal range of motion and neck supple.     Comments: +2 pedal edema  Skin:    General: Skin is warm and dry.  Neurological:     Mental Status: She is alert and oriented to person, place, and time.     Labs on Admission: I have personally reviewed following labs and imaging studies  CBC: Recent Labs  Lab 10/12/20 2331  WBC 10.8*  HGB 13.7  HCT 43.4  MCV 96.2  PLT 548*   Basic Metabolic Panel: Recent Labs  Lab 10/12/20 2331  NA 142  K 4.2  CL 104  CO2 25  GLUCOSE 117*  BUN 16  CREATININE 0.68  CALCIUM 9.5   GFR: Estimated Creatinine Clearance: 50 mL/min (by C-G formula based on SCr of 0.68 mg/dL). Liver Function Tests: No results for input(s): AST, ALT, ALKPHOS, BILITOT, PROT, ALBUMIN in the last 168 hours. No results for input(s): LIPASE, AMYLASE in the last 168 hours. No results for input(s): AMMONIA in the last 168 hours. Coagulation Profile: Recent Labs  Lab 10/12/20 2331  INR 1.0   Cardiac Enzymes: No results for input(s): CKTOTAL, CKMB, CKMBINDEX, TROPONINI in the last 168 hours. BNP (last 3 results) No results for input(s): PROBNP in the last 8760 hours. HbA1C: No results for input(s): HGBA1C in the last 72 hours. CBG: No results for input(s): GLUCAP in the last 168 hours. Lipid Profile: No results for input(s): CHOL, HDL, LDLCALC, TRIG, CHOLHDL, LDLDIRECT in the last 72 hours. Thyroid Function Tests: No results for input(s): TSH, T4TOTAL, FREET4, T3FREE, THYROIDAB in the last 72 hours. Anemia Panel: No results for input(s): VITAMINB12, FOLATE, FERRITIN, TIBC, IRON, RETICCTPCT in the last 72 hours. Urine analysis:     Component Value Date/Time   COLORURINE STRAW (A) 11/26/2019 1046   APPEARANCEUR CLEAR 11/26/2019 1046   LABSPEC 1.010 11/26/2019 1046   PHURINE 7.0 11/26/2019 1046   GLUCOSEU 50 (A) 11/26/2019 1046   HGBUR NEGATIVE 11/26/2019 1046   BILIRUBINUR negative 04/05/2020 1423   KETONESUR 20 (A) 11/26/2019 1046   PROTEINUR Negative 04/05/2020 1423   PROTEINUR NEGATIVE 11/26/2019 1046   UROBILINOGEN 0.2 04/05/2020 1423   UROBILINOGEN 0.2 05/26/2007 2307   NITRITE  negative 04/05/2020 1423   NITRITE NEGATIVE 11/26/2019 1046   LEUKOCYTESUR Negative 04/05/2020 1423   LEUKOCYTESUR NEGATIVE 11/26/2019 1046    Radiological Exams on Admission: No results found.  EKG: Independently reviewed.  Sinus rhythm, no significant change since prior tracing.  Assessment/Plan Principal Problem:   Tibia/fibula fracture Active Problems:   Multiple sclerosis (HCC)   Paraplegia, unspecified (HCC)   Neurogenic bladder   Left tib-fib fracture Patient was sent to the ED by Dr. Eulah Pont from orthopedics requesting hospital admission with anticipated surgical management by Dr. Jena Gauss either Saturday or Sunday.  Fracture was casted and wrapped at orthopedics office. -Morphine as needed for pain.  Keep n.p.o. overnight. Order diet if orthopedics is not planning on operating this morning.  MS Paraplegia Neurogenic bladder Followed by Dr. Epimenio Foot from neurology.  Per note from last office visit on 07/03/2020, she did not have any new lesions on MRI, as such, she was not started on disease modifying therapy. -Continue oxybutynin for neurogenic bladder.  Bladder scans every 6 hours.  DVT prophylaxis: Lovenox Code Status: Patient wishes to be full code. Family Communication: No family available at this time. Disposition Plan: Status is: Inpatient  Remains inpatient appropriate because:IV treatments appropriate due to intensity of illness or inability to take PO, Inpatient level of care appropriate due to severity of  illness and Needs surgery for tib-fib fracture   Dispo: The patient is from: Home              Anticipated d/c is to: SNF              Anticipated d/c date is: > 3 days              Patient currently is not medically stable to d/c.  The medical decision making on this patient was of high complexity and the patient is at high risk for clinical deterioration, therefore this is a level 3 visit.  John Giovanni MD [facility]  If 7PM-7AM, please contact night-coverage [email or web address]  10/13/2020, 1:24 AM

## 2020-10-13 NOTE — Plan of Care (Signed)

## 2020-10-13 NOTE — Transfer of Care (Signed)
Immediate Anesthesia Transfer of Care Note  Patient: [name]  Procedure(s) Performed: OPEN REDUCTION INTERNAL FIXATION (ORIF) TIBIA/FIBULA FRACTURE (Left )  Patient Location: PACU  Anesthesia Type:General  Level of Consciousness: awake, alert  and oriented  Airway & Oxygen Therapy: Patient Spontanous Breathing  Post-op Assessment: Report given to RN and Post -op Vital signs reviewed and stable  Post vital signs: Reviewed and stable  Last Vitals:  Vitals Value Taken Time  BP 108/65 10/13/20 1311  Temp    Pulse 80 10/13/20 1311  Resp 14 10/13/20 1311  SpO2 94 % 10/13/20 1311  Vitals shown include unvalidated device data.  Last Pain:  Vitals:   10/13/20 1000  TempSrc:   PainSc: 0-No pain         Complications: No complications documented.

## 2020-10-13 NOTE — Anesthesia Procedure Notes (Signed)
Procedure Name: Intubation Date/Time: 10/13/2020 11:45 AM Performed by: Clearnce Sorrel, CRNA Pre-anesthesia Checklist: Patient identified, Emergency Drugs available, Suction available, Patient being monitored and Timeout performed Patient Re-evaluated:Patient Re-evaluated prior to induction Oxygen Delivery Method: Circle system utilized Preoxygenation: Pre-oxygenation with 100% oxygen Induction Type: IV induction Ventilation: Mask ventilation without difficulty Laryngoscope Size: Mac and 3 Grade View: Grade II Tube type: Oral Tube size: 7.0 mm Number of attempts: 1 Airway Equipment and Method: Stylet Placement Confirmation: ETT inserted through vocal cords under direct vision,  positive ETCO2 and breath sounds checked- equal and bilateral Secured at: 22 cm Tube secured with: Tape Dental Injury: Teeth and Oropharynx as per pre-operative assessment

## 2020-10-13 NOTE — Progress Notes (Signed)
PROGRESS NOTE    [name]  YYT:[id number] DOB: 07/08/1946 DOA: 10/12/2020 PCP: Karie Schwalbe, MD    Brief Narrative:  [name] is a 74 year old female with past medical history notable for multiple sclerosis in which she is wheelchair-bound at baseline, MVP who was sent to the emergency department from her orthopedist office for need of operative management for a left tibia/fibular fracture.  Patient initially injured her leg after being caught in the wheelchair and was immobilized with a cast and her orthopedist office.  Patient continues with left lower extremity pain, otherwise no other complaints.  In the ED, WBC count 10.8, hemoglobin 13.7, platelet 548, sodium 142, potassium 4.2, chloride 104, bicarbonate 25, BUN 16, creatinine 0.6, glucose 117.  INR 1.0.  SARS-CoV-2/Covid-19 PCR negative.  Hospitalist service consulted for admission.   Assessment & Plan:   Principal Problem:   Tibia/fibula fracture Active Problems:   Multiple sclerosis (HCC)   Paraplegia, unspecified (HCC)   Neurogenic bladder   Left tibia/fibula fracture Patient presenting to the ED from her orthopedist's office, Dr. Eulah Pont for need of operative management of her acute left tibia/fibula fracture.  Patient is wheelchair-bound at baseline due to her underlying multiple sclerosis and injured her left leg after being caught in the wheelchair causing the leg to twist and fracture.  Splinted in orthopedics office prior to ED arrival. --Dr. Jena Gauss planning on operative management this morning --Continue pain management --PT/OT evaluation postoperatively  Multiple sclerosis Paraplegia Neurogenic bladder Follows with outpatient neurology, Dr. Epimenio Foot.  Recently seen on 07/03/2020, no new lesions on MRI and not started on disease modifying therapy. --Oxybutynin 2.5mg  PO daily for neurogenic bladder --Bladder scans every 6 hours   DVT prophylaxis: Lovenox Code Status: Full code Family  Communication: No family present at bedside this morning  Disposition Plan:  Status is: Inpatient  Remains inpatient appropriate because:Ongoing active pain requiring inpatient pain management, Unsafe d/c plan, IV treatments appropriate due to intensity of illness or inability to take PO and Inpatient level of care appropriate due to severity of illness   Dispo: The patient is from: Home              Anticipated d/c is to: SNF              Anticipated d/c date is: 2 days              Patient currently is not medically stable to d/c.   Consultants:   Orthopedics, Dr. Jena Gauss  Procedures:   None  Antimicrobials:   Perioperative cefazolin   Subjective: Patient seen and examined bedside, resting comfortably.  Pain reasonably controlled.  Continues in a splint.  Dr. Jena Gauss plans operative management this morning.  No other questions or concerns at this time.  Denies headache, no fever/chills/night sweats, no nausea/vomiting/diarrhea, no chest pain, palpitations, no shortness of breath, no abdominal pain.  No acute events overnight per nursing staff.  Objective: Vitals:   10/13/20 0254 10/13/20 0316 10/13/20 0600 10/13/20 0755  BP: 126/71 139/73 122/68 113/61  Pulse: 74 76 79 81  Resp: 12 14 14 17   Temp: 98.5 F (36.9 C) 98.4 F (36.9 C) 98.2 F (36.8 C) 98.9 F (37.2 C)  TempSrc: Oral Oral Oral Oral  SpO2: 96% 97% 98% 96%  Weight:      Height:       No intake or output data in the 24 hours ending 10/13/20 1124 Filed Weights   10/12/20 1759  Weight: 51.3 kg  Examination:  General exam: Appears calm and comfortable  Respiratory system: Clear to auscultation. Respiratory effort normal.  Oxygenating well on room air cardiovascular system: S1 & S2 heard, RRR. No JVD, murmurs, rubs, gallops or clicks. No pedal edema. Gastrointestinal system: Abdomen is nondistended, soft and nontender. No organomegaly or masses felt. Normal bowel sounds heard. Central nervous system:  Alert and oriented. No focal neurological deficits. Extremities: Left lower extremity with leg splint in place Skin: No rashes, lesions or ulcers Psychiatry: Judgement and insight appear normal. Mood & affect appropriate.     Data Reviewed: I have personally reviewed following labs and imaging studies  CBC: Recent Labs  Lab 10/12/20 2331  WBC 10.8*  HGB 13.7  HCT 43.4  MCV 96.2  PLT 548*   Basic Metabolic Panel: Recent Labs  Lab 10/12/20 2331  NA 142  K 4.2  CL 104  CO2 25  GLUCOSE 117*  BUN 16  CREATININE 0.68  CALCIUM 9.5   GFR: Estimated Creatinine Clearance: 50 mL/min (by C-G formula based on SCr of 0.68 mg/dL). Liver Function Tests: No results for input(s): AST, ALT, ALKPHOS, BILITOT, PROT, ALBUMIN in the last 168 hours. No results for input(s): LIPASE, AMYLASE in the last 168 hours. No results for input(s): AMMONIA in the last 168 hours. Coagulation Profile: Recent Labs  Lab 10/12/20 2331  INR 1.0   Cardiac Enzymes: No results for input(s): CKTOTAL, CKMB, CKMBINDEX, TROPONINI in the last 168 hours. BNP (last 3 results) No results for input(s): PROBNP in the last 8760 hours. HbA1C: No results for input(s): HGBA1C in the last 72 hours. CBG: No results for input(s): GLUCAP in the last 168 hours. Lipid Profile: No results for input(s): CHOL, HDL, LDLCALC, TRIG, CHOLHDL, LDLDIRECT in the last 72 hours. Thyroid Function Tests: No results for input(s): TSH, T4TOTAL, FREET4, T3FREE, THYROIDAB in the last 72 hours. Anemia Panel: No results for input(s): VITAMINB12, FOLATE, FERRITIN, TIBC, IRON, RETICCTPCT in the last 72 hours. Sepsis Labs: No results for input(s): PROCALCITON, LATICACIDVEN in the last 168 hours.  Recent Results (from the past 240 hour(s))  Respiratory Panel by RT PCR (Flu A&B, Covid) - Nasopharyngeal Swab     Status: None   Collection Time: 10/13/20  1:55 AM   Specimen: Nasopharyngeal Swab  Result Value Ref Range Status   SARS  Coronavirus 2 by RT PCR NEGATIVE NEGATIVE Final    Comment: (NOTE) SARS-CoV-2 target nucleic acids are NOT DETECTED.  The SARS-CoV-2 RNA is generally detectable in upper respiratoy specimens during the acute phase of infection. The lowest concentration of SARS-CoV-2 viral copies this assay can detect is 131 copies/mL. A negative result does not preclude SARS-Cov-2 infection and should not be used as the sole basis for treatment or other patient management decisions. A negative result may occur with  improper specimen collection/handling, submission of specimen other than nasopharyngeal swab, presence of viral mutation(s) within the areas targeted by this assay, and inadequate number of viral copies (<131 copies/mL). A negative result must be combined with clinical observations, patient history, and epidemiological information. The expected result is Negative.  Fact Sheet for Patients:  https://www.moore.com/  Fact Sheet for Healthcare Providers:  https://www.young.biz/  This test is no t yet approved or cleared by the [place] FDA and  has been authorized for detection and/or diagnosis of SARS-CoV-2 by FDA under an Emergency Use Authorization (EUA). This EUA will remain  in effect (meaning this test can be used) for the duration of the COVID-19 declaration under Section  564(b)(1) of the Act, 21 U.S.C. section 360bbb-3(b)(1), unless the authorization is terminated or revoked sooner.     Influenza A by PCR NEGATIVE NEGATIVE Final   Influenza B by PCR NEGATIVE NEGATIVE Final    Comment: (NOTE) The Xpert Xpress SARS-CoV-2/FLU/RSV assay is intended as an aid in  the diagnosis of influenza from Nasopharyngeal swab specimens and  should not be used as a sole basis for treatment. Nasal washings and  aspirates are unacceptable for Xpert Xpress SARS-CoV-2/FLU/RSV  testing.  Fact Sheet for  Patients: https://www.moore.com/  Fact Sheet for Healthcare Providers: https://www.young.biz/  This test is not yet approved or cleared by the [place] FDA and  has been authorized for detection and/or diagnosis of SARS-CoV-2 by  FDA under an Emergency Use Authorization (EUA). This EUA will remain  in effect (meaning this test can be used) for the duration of the  Covid-19 declaration under Section 564(b)(1) of the Act, 21  U.S.C. section 360bbb-3(b)(1), unless the authorization is  terminated or revoked. Performed at [facility], [address], [place], [place] [address]   MRSA PCR Screening     Status: None   Collection Time: 10/13/20  3:24 AM   Specimen: Nasal Mucosa; Nasopharyngeal  Result Value Ref Range Status   MRSA by PCR NEGATIVE NEGATIVE Final    Comment:        The GeneXpert MRSA Assay (FDA approved for NASAL specimens only), is one component of a comprehensive MRSA colonization surveillance program. It is not intended to diagnose MRSA infection nor to guide or monitor treatment for MRSA infections. Performed at [facility], [address], [place], [place] [address]          Radiology Studies: No results found.      Scheduled Meds: . [MAR Hold] enoxaparin (LOVENOX) injection  40 mg Subcutaneous Q24H  . [MAR Hold] methenamine  1 g Oral QODAY  . [MAR Hold] mupirocin ointment  1 application Nasal BID  . [MAR Hold] oxybutynin  2.5 mg Oral Daily   Continuous Infusions: . [MAR Hold]  ceFAZolin (ANCEF) IV    . lactated ringers 10 mL/hr at 10/13/20 1100     LOS: 0 days    Time spent: 37 minutes spent on chart review, discussion with nursing staff, consultants, updating family and interview/physical exam; more than 50% of that time was spent in counseling and/or coordination of care.    Alvira Philips [place], DO [facility] Available via Epic secure chat 7am-7pm After these hours, please  refer to coverage provider listed on [email or web address] 10/13/2020, 11:24 AM

## 2020-10-13 NOTE — Op Note (Signed)
Orthopaedic Surgery Operative Note (CSN: [id number] ) Date of Surgery: 10/13/2020  Admit Date: 10/12/2020   Diagnoses: Pre-Op Diagnoses: Left distal tibia/fibula fracture  Post-Op Diagnosis: Same  Procedures: CPT 27828-Open reduction internal fixation of left distal tibia fracture  Surgeons : Primary: Roby Lofts, MD  Assistant: Ulyses Southward, PA-C  Location: OR 5   Anesthesia:General  Antibiotics: Ancef 2g preop with 1 gm vancomycin powder placed topically   Tourniquet time:None    Estimated Blood Loss:15 mL  Complications:None   Specimens:None   Implants: Implant Name Type Inv. Item Serial No. Manufacturer Lot No. LRB No. Used Action  PLATE 9H 951 LT MED DIST TIB - [id number] Plate PLATE 9H 016 LT MED DIST TIB  [employer](ORTH,TRAU,BIO,SG)  Left 1 Implanted  SCREW LOCK CORT STAR 3.5X32 - [id number] Screw SCREW LOCK CORT STAR 3.5X32  [employer](ORTH,TRAU,BIO,SG)  Left 2 Implanted  SCREW LOCK CORT STAR 3.5X34 - [id number] Screw SCREW LOCK CORT STAR 3.5X34  [employer](ORTH,TRAU,BIO,SG)  Left 1 Implanted  SCREW LOCK CORT STAR 3.5X36 - [id number] Screw SCREW LOCK CORT STAR 3.5X36  [employer](ORTH,TRAU,BIO,SG)  Left 1 Implanted  SCREW LOCK CORT STAR 3.5X40 - [id number] Screw SCREW LOCK CORT STAR 3.5X40  [employer](ORTH,TRAU,BIO,SG)  Left 1 Implanted  SCREW LOCK CORT STAR 3.5X22 - [id number] Screw SCREW LOCK CORT STAR 3.5X22  [employer](ORTH,TRAU,BIO,SG)  Left 3 Implanted  SCREW CORT 3.5X26 - [id number] Screw SCREW CORT 3.5X26  [employer](ORTH,TRAU,BIO,SG)  Left 1 Implanted     Indications for Surgery: 74 year old female with a history of MS and a previous ankle sprain that got her leg caught up underneath wheelchair and sustained a distal tibia and fibula fracture.  She does transfer with her upper extremities and occasionally her legs.  Due to the unstable nature of her injury I recommend proceeding with open reduction internal fixation of her distal tibia.  Risks  and benefits were discussed with the patient.  Risks included but not limited to bleeding, infection, malunion, nonunion, hardware failure, hardware irritation, nerve or blood vessel injury, DVT, even the possibility anesthetic complications.  Patient agreed to proceed with surgery and consent was obtained.  Operative Findings: 1.  Open reduction internal fixation of right distal tibia/fibula fracture using [employer] ALPS medial distal tibial locking plate 2.  Significant osteoporosis  Procedure: The patient was identified in the preoperative holding area. Consent was confirmed with the patient and their family and all questions were answered. The operative extremity was marked after confirmation with the patient. she was then brought back to the operating room by our anesthesia colleagues.  She was carefully transferred over to a radiolucent flat top table.  She was placed under general anesthetic.  A bump was placed under her operative hip.  The left lower extremity was then prepped and draped in usual sterile fashion.  A timeout was performed to verify the patient, the procedure, and the extremity.  Preoperative antibiotics were dosed.  Fluoroscopic imaging was obtained to show the unstable nature of her injury.  I made a small incision at the anterior medial aspect of the distal tibia.  I mobilized the skin and subcutaneous tissue to expose the periosteum and the distal medial face of the tibia.  I then developed the plane subcutaneously along the medial border of the tibial shaft.  I aligned it appropriately using a towel bump.  I then slid a 9 hole [employer]PS distal tibial locking plate along the medial surface of the tibia.  I held it  provisionally distally with a K wire and proximally with a percutaneous K wire.  Once I confirmed adequate alignment of reduction I then placed a locking screws into the distal articular segment.  I then percutaneously placed 1 nonlocking screw and then 2  locking screws through the tibial shaft.  I removed the 1 nonlocking screw and exchanged for locking screw due to her poor bone quality.  Final fluoroscopic imaging was obtained.  The incision was copiously irrigated.  A gram of vancomycin powder was placed into the incision.  A layer closure of 2-0 Vicryl, 3-0 Monocryl and Steri-Strips was used to close the skin.  Sterile dressing was placed.  The patient was then awoken from anesthesia and taken to the PACU in stable condition.  Post Op Plan/Instructions: Patient will be nonweightbearing to the left lower extremity.  She will receive postoperative Ancef.  She will receive Lovenox for DVT prophylaxis.  We will mobilize her with physical and Occupational Therapy.  However she will need a skilled nursing facility post discharge.  I was present and performed the entire surgery.  Ulyses Southward, PA-C did assist me throughout the case. An assistant was necessary given the difficulty in approach, maintenance of reduction and ability to instrument the fracture.   Truitt Merle, MD [facility]

## 2020-10-13 NOTE — Anesthesia Preprocedure Evaluation (Addendum)
Anesthesia Evaluation  Patient identified by MRN, date of birth, ID band Patient awake    Reviewed: Allergy & Precautions, NPO status , Patient's Chart, lab work & pertinent test results  Airway Mallampati: III  TM Distance: >3 FB Neck ROM: Full    Dental no notable dental hx.    Pulmonary neg pulmonary ROS,    Pulmonary exam normal breath sounds clear to auscultation       Cardiovascular Normal cardiovascular exam+ dysrhythmias Atrial Fibrillation  Rhythm:Regular Rate:Normal  ECG: SR, rate 69   Neuro/Psych MS (multiple sclerosis) negative psych ROS   GI/Hepatic negative GI ROS, Neg liver ROS,   Endo/Other  negative endocrine ROS  Renal/GU negative Renal ROS     Musculoskeletal  (+) Arthritis , Wheelchair-bound   Abdominal   Peds  Hematology negative hematology ROS (+)   Anesthesia Other Findings Left distal tibia fracture  Reproductive/Obstetrics                            Anesthesia Physical Anesthesia Plan  ASA: III  Anesthesia Plan: General   Post-op Pain Management:    Induction: Intravenous  PONV Risk Score and Plan: 3 and Ondansetron, Dexamethasone, Midazolam and Treatment may vary due to age or medical condition  Airway Management Planned: Oral ETT  Additional Equipment:   Intra-op Plan:   Post-operative Plan: Extubation in OR  Informed Consent: I have reviewed the patients History and Physical, chart, labs and discussed the procedure including the risks, benefits and alternatives for the proposed anesthesia with the patient or authorized representative who has indicated his/her understanding and acceptance.     Dental advisory given  Plan Discussed with: CRNA  Anesthesia Plan Comments:       Anesthesia Quick Evaluation

## 2020-10-13 NOTE — Progress Notes (Signed)
Patient states she is unable to read consent form due to not having glasses at the hospital. Consent form read to patient and give verbal consent witnessed by two RNs.

## 2020-10-13 NOTE — Consult Note (Addendum)
Orthopaedic Trauma Service (OTS) Consult   Patient ID: [name] MRN: [id number] DOB/AGE: January 14, 1946 74 y.o.  Reason for Consult: Left tibia/fibula fracture Referring Physician: Dr. Renaye Rakers [facility] orth[facility])  HPI: [name] is an 74 y.o. female being seen in consultation request of Dr. Renaye Rakers for left tibia/fibula fracture. Patient was sent to the ED by Dr. Eulah Pont on October 12, 2020. Patient is wheelchair-bound due to MS.  She injured her leg after it got caught in the wheelchair and her son did not recognize that.  He continued to wheel her causing the leg to twist and fracture.  No other injuries reported.    Patient seen on 5N this morning.  Now that leg is splinted, denies any significant pain in the left leg. She has no other complaints.  Denies fevers, chills.  Denies any significant numbness or tingling in the extremity.   Past Medical History:  Diagnosis Date  . Ankle fracture, left 07/2006  . Ankle fracture, right 10/30/06   [facility]  . Compression fracture 2011   Dr. Dion Saucier   . Disc herniation    L5, S1  . MS (multiple sclerosis) (HCC)    evaluated by Dr. Sandria Manly  . MVP (mitral valve prolapse) 1979   ECHO. Trace MR, trace TR, trace PR 07/17/03  . Nephrolithiasis   . Osteoporosis   . Rib fractures 1998, [id number]   2  . Toe fracture 1998   5    Past Surgical History:  Procedure Laterality Date  . LUMBAR DISC SURGERY  04/2007   L5, S1- [facility]  . SEPTOPLASTY  1983   deviated septum  . TONSILLECTOMY AND ADENOIDECTOMY  1960  . TOTAL ABDOMINAL HYSTERECTOMY W/ BILATERAL SALPINGOOPHORECTOMY  1982   for endometriosis    Family History  Problem Relation Age of Onset  . Stroke Mother   . Neuropathy Brother   . Stroke Father   . Hypertension Neg Hx   . Diabetes Neg Hx   . Depression Neg Hx   . Alcohol abuse Neg Hx   . Drug abuse Neg Hx     Social History:  reports that she has never smoked. She has never used smokeless tobacco. She reports that  she does not drink alcohol and does not use drugs.  Allergies:  Allergies  Allergen Reactions  . Procaine Hcl     REACTION: UNSPECIFIED    Medications:  I have reviewed the patient's current medications. Prior to Admission:  Medications Prior to Admission  Medication Sig Dispense Refill Last Dose  . methenamine (HIPREX) 1 g tablet Take 1 tablet (1 g total) by mouth daily. (Patient taking differently: Take 1 g by mouth every other day. ) 30 tablet 0 10/11/2020 at Unknown time  . cyanocobalamin (,VITAMIN B-12,) 1000 MCG/ML injection Inject 1,000 mcg into the muscle once.     Marland Kitchen oxybutynin (DITROPAN) 5 MG tablet Take 1/2  To 1 pill once or twice a day 60 tablet 5     ROS: Constitutional: No fever or chills Vision: No changes in vision ENT: No difficulty swallowing CV: No chest pain Pulm: No SOB or wheezing GI: No nausea or vomiting GU: No urgency or inability to hold urine Skin: No poor wound healing Neurologic: No numbness or tingling Psychiatric: No depression or anxiety Heme: No bruising Allergic: No reaction to medications or food   Exam: Blood pressure 113/61, pulse 81, temperature 98.9 F (37.2 C), temperature source Oral, resp. rate 17, height 5\' 3"  (1.6  m), weight 51.3 kg, SpO2 96 %. General: Laying in bed, no acute distress Orientation: Alert and oriented x3 Mood and Affect: Mood and affect appropriate.  Pleasant and cooperative Gait: Gait not assessed Coordination and balance: Within normal limits  Left lower extremity: Short leg splint in place.  No significant tenderness above splint.  Able to wiggle toes.  Endorses sensation to light touch distally.  Foot warm and well-perfused  Right lower extremity: Skin without lesions. No tenderness to palpation.  Able to flex and extend the knee.  Ankle motion intact.  Endorses sensation to light touch distally.  Neurovascularly intact.  Medical Decision Making: Data: Imaging: Imaging of left tibia performed in [facility] shows left tibia/fibula fracture  Labs:  Results for orders placed or performed during the hospital encounter of 10/12/20 (from the past 24 hour(s))  Basic metabolic panel     Status: Abnormal   Collection Time: 10/12/20 11:31 PM  Result Value Ref Range   Sodium 142 135 - 145 mmol/L   Potassium 4.2 3.5 - 5.1 mmol/L   Chloride 104 98 - 111 mmol/L   CO2 25 22 - 32 mmol/L   Glucose, Bld 117 (H) 70 - 99 mg/dL   BUN 16 8 - 23 mg/dL   Creatinine, Ser 8.12 0.44 - 1.00 mg/dL   Calcium 9.5 8.9 - 75.1 mg/dL   GFR, Estimated >70 >01 mL/min   Anion gap 13 5 - 15  CBC     Status: Abnormal   Collection Time: 10/12/20 11:31 PM  Result Value Ref Range   WBC 10.8 (H) 4.0 - 10.5 K/uL   RBC 4.51 3.87 - 5.11 MIL/uL   Hemoglobin 13.7 12.0 - 15.0 g/dL   HCT 74.9 36 - 46 %   MCV 96.2 80.0 - 100.0 fL   MCH 30.4 26.0 - 34.0 pg   MCHC 31.6 30.0 - 36.0 g/dL   RDW 44.9 67.5 - 91.6 %   Platelets 548 (H) 150 - 400 K/uL   nRBC 0.0 0.0 - 0.2 %  Protime-INR     Status: None   Collection Time: 10/12/20 11:31 PM  Result Value Ref Range   Prothrombin Time 12.4 11.4 - 15.2 seconds   INR 1.0 0.8 - 1.2  Type and screen [facility]     Status: None   Collection Time: 10/12/20 11:43 PM  Result Value Ref Range   ABO/RH(D) B POS    Antibody Screen NEG    Sample Expiration      10/15/2020,2359 Performed at [facility], [address] [place], [place] [address]   Respiratory Panel by RT PCR (Flu A&B, Covid) - Nasopharyngeal Swab     Status: None   Collection Time: 10/13/20  1:55 AM   Specimen: Nasopharyngeal Swab  Result Value Ref Range   SARS Coronavirus 2 by RT PCR NEGATIVE NEGATIVE   Influenza A by PCR NEGATIVE NEGATIVE   Influenza B by PCR NEGATIVE NEGATIVE  MRSA PCR Screening     Status: None   Collection Time: 10/13/20  3:24 AM   Specimen: Nasal Mucosa; Nasopharyngeal  Result Value Ref Range   MRSA by PCR NEGATIVE NEGATIVE     Imaging or Labs ordered:  Vitamin D level  Assessment/Plan: 74 year old female with history of MS presenting after twisting left leg injury, found to have left tibia/fibula fracture.  Patient will require surgical fixation of her fracture.  We will plan to proceed with this later today.  Risk  and benefits of procedure discussed with the patient. Risks discussed included bleeding, infection, malunion, nonunion, damage to surrounding nerves and blood vessels, pain, hardware prominence or irritation, hardware failure, stiffness, DVT/PE, compartment syndrome, and anesthesia complications.  Patient states understanding of these risks and agrees to proceed with surgery. We will plan for patient to work with physical and occupational therapy postoperatively and she will likely need SNF.   Garwood Wentzell A. Ladonna Snide [facility] [phone] (office) [email or web address]

## 2020-10-13 NOTE — Progress Notes (Deleted)
Orthopaedic Trauma Service (OTS) Consult   Patient ID: [name] MRN: [id number] DOB/AGE: January 14, 1946 74 y.o.  Reason for Consult: Left tibia/fibula fracture Referring Physician: Dr. Renaye Rakers [facility] orth[facility])  HPI: [name] is an 74 y.o. female being seen in consultation request of Dr. Renaye Rakers for left tibia/fibula fracture. Patient was sent to the ED by Dr. Eulah Pont on October 12, 2020. Patient is wheelchair-bound due to MS.  She injured her leg after it got caught in the wheelchair and her son did not recognize that.  He continued to wheel her causing the leg to twist and fracture.  No other injuries reported.    Patient seen on 5N this morning.  Now that leg is splinted, denies any significant pain in the left leg. She has no other complaints.  Denies fevers, chills.  Denies any significant numbness or tingling in the extremity.   Past Medical History:  Diagnosis Date  . Ankle fracture, left 07/2006  . Ankle fracture, right 10/30/06   [facility]  . Compression fracture 2011   Dr. Dion Saucier   . Disc herniation    L5, S1  . MS (multiple sclerosis) (HCC)    evaluated by Dr. Sandria Manly  . MVP (mitral valve prolapse) 1979   ECHO. Trace MR, trace TR, trace PR 07/17/03  . Nephrolithiasis   . Osteoporosis   . Rib fractures 1998, [id number]   2  . Toe fracture 1998   5    Past Surgical History:  Procedure Laterality Date  . LUMBAR DISC SURGERY  04/2007   L5, S1- [facility]  . SEPTOPLASTY  1983   deviated septum  . TONSILLECTOMY AND ADENOIDECTOMY  1960  . TOTAL ABDOMINAL HYSTERECTOMY W/ BILATERAL SALPINGOOPHORECTOMY  1982   for endometriosis    Family History  Problem Relation Age of Onset  . Stroke Mother   . Neuropathy Brother   . Stroke Father   . Hypertension Neg Hx   . Diabetes Neg Hx   . Depression Neg Hx   . Alcohol abuse Neg Hx   . Drug abuse Neg Hx     Social History:  reports that she has never smoked. She has never used smokeless tobacco. She reports that  she does not drink alcohol and does not use drugs.  Allergies:  Allergies  Allergen Reactions  . Procaine Hcl     REACTION: UNSPECIFIED    Medications:  I have reviewed the patient's current medications. Prior to Admission:  Medications Prior to Admission  Medication Sig Dispense Refill Last Dose  . methenamine (HIPREX) 1 g tablet Take 1 tablet (1 g total) by mouth daily. (Patient taking differently: Take 1 g by mouth every other day. ) 30 tablet 0 10/11/2020 at Unknown time  . cyanocobalamin (,VITAMIN B-12,) 1000 MCG/ML injection Inject 1,000 mcg into the muscle once.     Marland Kitchen oxybutynin (DITROPAN) 5 MG tablet Take 1/2  To 1 pill once or twice a day 60 tablet 5     ROS: Constitutional: No fever or chills Vision: No changes in vision ENT: No difficulty swallowing CV: No chest pain Pulm: No SOB or wheezing GI: No nausea or vomiting GU: No urgency or inability to hold urine Skin: No poor wound healing Neurologic: No numbness or tingling Psychiatric: No depression or anxiety Heme: No bruising Allergic: No reaction to medications or food   Exam: Blood pressure 113/61, pulse 81, temperature 98.9 F (37.2 C), temperature source Oral, resp. rate 17, height 5\' 3"  (1.6  m), weight 51.3 kg, SpO2 96 %. General: Laying in bed, no acute distress Orientation: Alert and oriented x3 Mood and Affect: Mood and affect appropriate.  Pleasant and cooperative Gait: Gait not assessed Coordination and balance: Within normal limits  Left lower extremity: Short leg splint in place.  No significant tenderness above splint.  Able to wiggle toes.  Endorses sensation to light touch distally.  Foot warm and well-perfused  Right lower extremity: Skin without lesions. No tenderness to palpation.  Able to flex and extend the knee.  Ankle motion intact.  Endorses sensation to light touch distally.  Neurovascularly intact.  Medical Decision Making: Data: Imaging: Imaging of left tibia performed in [facility] shows left tibia/fibula fracture  Labs:  Results for orders placed or performed during the hospital encounter of 10/12/20 (from the past 24 hour(s))  Basic metabolic panel     Status: Abnormal   Collection Time: 10/12/20 11:31 PM  Result Value Ref Range   Sodium 142 135 - 145 mmol/L   Potassium 4.2 3.5 - 5.1 mmol/L   Chloride 104 98 - 111 mmol/L   CO2 25 22 - 32 mmol/L   Glucose, Bld 117 (H) 70 - 99 mg/dL   BUN 16 8 - 23 mg/dL   Creatinine, Ser 1.91 0.44 - 1.00 mg/dL   Calcium 9.5 8.9 - 47.8 mg/dL   GFR, Estimated >29 >56 mL/min   Anion gap 13 5 - 15  CBC     Status: Abnormal   Collection Time: 10/12/20 11:31 PM  Result Value Ref Range   WBC 10.8 (H) 4.0 - 10.5 K/uL   RBC 4.51 3.87 - 5.11 MIL/uL   Hemoglobin 13.7 12.0 - 15.0 g/dL   HCT 21.3 36 - 46 %   MCV 96.2 80.0 - 100.0 fL   MCH 30.4 26.0 - 34.0 pg   MCHC 31.6 30.0 - 36.0 g/dL   RDW 08.6 57.8 - 46.9 %   Platelets 548 (H) 150 - 400 K/uL   nRBC 0.0 0.0 - 0.2 %  Protime-INR     Status: None   Collection Time: 10/12/20 11:31 PM  Result Value Ref Range   Prothrombin Time 12.4 11.4 - 15.2 seconds   INR 1.0 0.8 - 1.2  Type and screen [facility]     Status: None   Collection Time: 10/12/20 11:43 PM  Result Value Ref Range   ABO/RH(D) B POS    Antibody Screen NEG    Sample Expiration      10/15/2020,2359 Performed at [facility], [address], [place], [place] [address]   Respiratory Panel by RT PCR (Flu A&B, Covid) - Nasopharyngeal Swab     Status: None   Collection Time: 10/13/20  1:55 AM   Specimen: Nasopharyngeal Swab  Result Value Ref Range   SARS Coronavirus 2 by RT PCR NEGATIVE NEGATIVE   Influenza A by PCR NEGATIVE NEGATIVE   Influenza B by PCR NEGATIVE NEGATIVE  MRSA PCR Screening     Status: None   Collection Time: 10/13/20  3:24 AM   Specimen: Nasal Mucosa; Nasopharyngeal  Result Value Ref Range   MRSA by PCR NEGATIVE NEGATIVE     Imaging or Labs ordered:  Vitamin D level  Assessment/Plan: 74 year old female with history of MS presenting after twisting left leg injury, found to have left tibia/fibula fracture.  Patient will require surgical fixation of her fracture.  We will plan to proceed with later today.  Risk and  benefits of procedure discussed with the patient. Risks discussed included bleeding, infection, malunion, nonunion, damage to surrounding nerves and blood vessels, pain, hardware prominence or irritation, hardware failure, stiffness, DVT/PE, compartment syndrome, and anesthesia complications.  Patient states understanding of these risks and agrees to proceed with surgery. We will plan for patient to work with physical and occupational therapy postoperatively and she will likely need SNF.   Marlise Fahr A. Ladonna Snide [facility] [phone] (office) [email or web address]

## 2020-10-14 LAB — CBC
HCT: 34.3 % — ABNORMAL LOW (ref 36.0–46.0)
Hemoglobin: 10.8 g/dL — ABNORMAL LOW (ref 12.0–15.0)
MCH: 30.4 pg (ref 26.0–34.0)
MCHC: 31.5 g/dL (ref 30.0–36.0)
MCV: 96.6 fL (ref 80.0–100.0)
Platelets: 383 10*3/uL (ref 150–400)
RBC: 3.55 MIL/uL — ABNORMAL LOW (ref 3.87–5.11)
RDW: 11.4 % — ABNORMAL LOW (ref 11.5–15.5)
WBC: 14.3 10*3/uL — ABNORMAL HIGH (ref 4.0–10.5)
nRBC: 0 % (ref 0.0–0.2)

## 2020-10-14 LAB — VITAMIN D 25 HYDROXY (VIT D DEFICIENCY, FRACTURES): Vit D, 25-Hydroxy: 12.47 ng/mL — ABNORMAL LOW (ref 30–100)

## 2020-10-14 LAB — ABO/RH: ABO/RH(D): B POS

## 2020-10-14 MED ORDER — MENTHOL 3 MG MT LOZG
1.0000 | LOZENGE | OROMUCOSAL | Status: DC | PRN
  Administered 2020-10-14: 3 mg via ORAL
  Filled 2020-10-14: qty 9

## 2020-10-14 NOTE — Evaluation (Signed)
Occupational Therapy Evaluation Patient Details Name: [name] MRN: [id number] DOB: Jul 30, 1946 Today's Date: 10/14/2020    History of Present Illness 74 year old female with a history of multiple sclerosis with a L distal tibia and fibula fracture, now s/p ORIF, NWB; Uses wheelchair for mobility; She does transfer but uses primarily her upper extremities to her.  She has a rotator cuff tear that has limited her ability to do this.  She just got over a left ankle sprain. has a past medical history of Ankle fracture, left (07/2006), Ankle fracture, right (10/30/06), Compression fracture (2011), Disc herniation, MS (multiple sclerosis) (HCC), MVP (mitral valve prolapse) (1979), Nephrolithiasis, Osteoporosis, Rib fractures (1998, 4944,9675), and Toe fracture (1998).   Clinical Impression   PTA pt living alone, and able to function at mod I level of independence prior to this initial incident with her ankle (~5 weeks ago). Since then, pt has had PCAs come in to assist with ADL/IADL during the day, and son stays at night. She has needed assist with transfers recently as well. At time of eval, pt able to complete bed mobility with mod-max A +2. Once EOB, pt with forward flexed posture and difficulty maintaining upright posture without external support from OT. She was then able to complete AP transfer to recliner with max A +2. Lift pad placed under pt for RN staff to return pt to bed. Discussed pts typical toileting regimen (she does not have any sensation for bowel/bladder) and when to call RN staff for toileting needs. Noted per chart review pt planned to have R total shoulder arthroplasty. Will continue to assess s/p surgery, although SNF is most appropriate recommendation at this time. Pt will require much increased assistance, especially after shoulder sx. Will continue to follow per POC listed below.    Follow Up Recommendations  SNF    Equipment Recommendations  None recommended by OT     Recommendations for Other Services       Precautions / Restrictions Precautions Precautions: Fall Restrictions Weight Bearing Restrictions: Yes LLE Weight Bearing: Non weight bearing      Mobility Bed Mobility Overal bed mobility: Needs Assistance Bed Mobility: Supine to Sit     Supine to sit: Mod assist;Max assist;+2 for physical assistance;+2 for safety/equipment     General bed mobility comments: Reached relative sitting position first by pushing UEs posteriorly through elbow prop with mod assist (LEs in relative circle sit positioning); Then Max assist with handheld assist to pull to sit and support at lower trunk posteriorly, and use of bed pad; attempted lateral elbow props L and R to help with hip hikes and reciprocal scooting in bed; needed Max assist and use of bed pad    Transfers Overall transfer level: Needs assistance   Transfers: Licensed conveyancer transfers: Max assist;+2 physical assistance   General transfer comment: 2 person assist and use of bed pad to slide pt posteriorly into recliner    Balance Overall balance assessment: Needs assistance Sitting-balance support: Bilateral upper extremity supported;Feet unsupported Sitting balance-Leahy Scale: Poor Sitting balance - Comments: Needed at least close guard, and at times min/mod assist for sitting balance; tending to have lower and upper trunk flexed in circle sitting; dependent on UEs for support                                   ADL either performed or assessed with  clinical judgement   ADL Overall ADL's : Needs assistance/impaired Eating/Feeding: Set up;Sitting   Grooming: Set up;Sitting   Upper Body Bathing: Set up;Sitting   Lower Body Bathing: Maximal assistance;Sitting/lateral leans;Sit to/from stand   Upper Body Dressing : Set up;Sitting   Lower Body Dressing: Maximal assistance;Sitting/lateral leans;Sit to/from stand   Toilet Transfer:  Maximal assistance;+2 for physical assistance;+2 for safety/equipment Toilet Transfer Details (indicate cue type and reason): AP transfer to chair. Pt with minimal ability to clear hips for scoot Toileting- Clothing Manipulation and Hygiene: Total assistance       Functional mobility during ADLs: Maximal assistance;+2 for physical assistance;+2 for safety/equipment (AP t/f)       Vision Patient Visual Report: No change from baseline       Perception     Praxis      Pertinent Vitals/Pain Pain Assessment: 0-10 Pain Score: 4  Pain Location: Mostly L lower leg; lots of trial and error to find a position of relative comfort in recliner Pain Descriptors / Indicators: Aching;Discomfort Pain Intervention(s): Limited activity within patient's tolerance;Monitored during session;Repositioned     Hand Dominance Right   Extremity/Trunk Assessment Upper Extremity Assessment Upper Extremity Assessment: Generalized weakness;RUE deficits/detail RUE Deficits / Details: reports pain with ROM due to rotator cuff dysfunction   Lower Extremity Assessment Lower Extremity Assessment: Defer to PT evaluation       Communication Communication Communication: No difficulties   Cognition Arousal/Alertness: Awake/alert Behavior During Therapy: WFL for tasks assessed/performed;Anxious Overall Cognitive Status: Impaired/Different from baseline Area of Impairment: Memory;Problem solving                     Memory: Decreased short-term memory       Problem Solving: Requires verbal cues;Requires tactile cues General Comments: pt presenting with STM deficits(likely baseline). Requires increased cues to problem solve, mostly due to being anxious about transfers   General Comments       Exercises     Shoulder Instructions      Home Living Family/patient expects to be discharged to:: Private residence Living Arrangements: Children Available Help at Discharge: Personal care  attendant;Family (son stays at night; PCA during day) Type of Home: House Home Access: Ramped entrance     Home Layout: One level     Bathroom Shower/Tub: Other (comment) (sink level baths)   Bathroom Toilet:  (BSC rigged to bed)     Home Equipment: Wheelchair - power;Wheelchair - manual   Additional Comments: also has slide board      Prior Functioning/Environment Level of Independence: Needs assistance  Gait / Transfers Assistance Needed: assist in/out of w/c ADL's / Homemaking Assistance Needed: needing assist to get on/off toilet recently   Comments: Normally living alone and functioning independently; as of recently she required assist from PCA and son staying at night.        OT Problem List: Decreased strength;Decreased knowledge of use of DME or AE;Decreased knowledge of precautions;Decreased activity tolerance;Decreased cognition;Impaired balance (sitting and/or standing);Pain      OT Treatment/Interventions: Self-care/ADL training;Therapeutic exercise;Patient/family education;Balance training;Energy conservation;Therapeutic activities;DME and/or AE instruction    OT Goals(Current goals can be found in the care plan section) Acute Rehab OT Goals Patient Stated Goal: indicated she plans on going to SNF OT Goal Formulation: With patient Time For Goal Achievement: 10/28/20 Potential to Achieve Goals: Good  OT Frequency: Min 2X/week   Barriers to D/C:            Co-evaluation PT/OT/SLP Co-Evaluation/Treatment: Yes Reason for  Co-Treatment: For patient/therapist safety;To address functional/ADL transfers   OT goals addressed during session: ADL's and self-care;Proper use of Adaptive equipment and DME;Strengthening/ROM      AM-PAC OT "6 Clicks" Daily Activity     Outcome Measure Help from another person eating meals?: A Little Help from another person taking care of personal grooming?: A Little Help from another person toileting, which includes using toliet,  bedpan, or urinal?: A Lot Help from another person bathing (including washing, rinsing, drying)?: A Lot Help from another person to put on and taking off regular upper body clothing?: A Little Help from another person to put on and taking off regular lower body clothing?: Total 6 Click Score: 14   End of Session Nurse Communication: Mobility status;Need for lift equipment  Activity Tolerance: Patient tolerated treatment well Patient left: in chair;with call bell/phone within reach;with chair alarm set  OT Visit Diagnosis: Unsteadiness on feet (R26.81);Other abnormalities of gait and mobility (R26.89);Muscle weakness (generalized) (M62.81);Other symptoms and signs involving the nervous system (R29.898);Pain Pain - Right/Left: Left Pain - part of body: Ankle and joints of foot;Leg                Time: 6808-8110 OT Time Calculation (min): 46 min Charges:  OT General Charges $OT Visit: 1 Visit OT Evaluation $OT Eval Moderate Complexity: 1 Mod  Dalphine Handing, MSOT, OTR/L Acute Rehabilitation Services [facility] Office Number: [phone] Pager: [phone]  Dalphine Handing 10/14/2020, 5:07 PM

## 2020-10-14 NOTE — Evaluation (Signed)
Physical Therapy Evaluation Patient Details Name: [name] MRN: [id number] DOB: 01/05/1946 Today's Date: 10/14/2020   History of Present Illness  74 year old female with a history of multiple sclerosis with a L distal tibia and fibula fracture, now s/p ORIF, NWB; Uses wheelchair for mobility; She does transfer but uses primarily her upper extremities to her.  She has a rotator cuff tear that has limited her ability to do this.  She just got over a left ankle sprain. has a past medical history of Ankle fracture, left (07/2006), Ankle fracture, right (10/30/06), Compression fracture (2011), Disc herniation, MS (multiple sclerosis) (HCC), MVP (mitral valve prolapse) (1979), Nephrolithiasis, Osteoporosis, Rib fractures (1998, 9163,8466), and Toe fracture (1998).  Clinical Impression   Patient is s/p above surgery resulting in functional limitations due to the deficits listed below (see PT Problem List). Comes from home where she was independent, using her power chair for moblity, up until approx 5 weeks ago when she sprained her ankle and injure her R shoulder;  After that, she required assistance, and worked with a personal care attendant during the day, and her son staying with her and helping her at night; Presents to PT with dependencies in mobility and transfers, L LE pain and difficulty finding positions of comfort, weakness, inefficient movement patterns; Agree with SNF stay for post-acute rehab to maximize independence and safety with mobility, especially with noted plans for shoulder surgery soon;  Patient will benefit from skilled PT to increase their independence and safety with mobility to allow discharge to the venue listed below.       Follow Up Recommendations SNF    Equipment Recommendations  None recommended by PT (pretty well-equipped)    Recommendations for Other Services       Precautions / Restrictions Precautions Precautions: Fall Restrictions Weight Bearing  Restrictions: Yes LLE Weight Bearing: Non weight bearing      Mobility  Bed Mobility Overal bed mobility: Needs Assistance Bed Mobility: Supine to Sit     Supine to sit: Mod assist;Max assist;+2 for physical assistance;+2 for safety/equipment     General bed mobility comments: Reached relative sitting position first by pushing UEs posteriorly through elbow prop with mod assist (LEs in relative circle sit positioning); Then Max assist with handheld assist to pull to sit and support at lower trunk posteriorly, and use of bed pad; attempted lateral elbow props L and R to help with hip hikes and reciprocal scooting in bed; needed Max assist and use of bed pad    Transfers Overall transfer level: Needs assistance Equipment used:  (bed pads) Transfers: Licensed conveyancer transfers: Max assist;+2 physical assistance   General transfer comment: 2 person assist and use of bed pad to slide pt posteriorly into recliner  Ambulation/Gait                Stairs            Wheelchair Mobility    Modified Rankin (Stroke Patients Only)       Balance Overall balance assessment: Needs assistance Sitting-balance support: Bilateral upper extremity supported;Feet unsupported Sitting balance-Leahy Scale: Poor Sitting balance - Comments: Needed at least close guard, and at times min/mod assist for sitting balance; tending to have lower and upper trunk flexed in circle sitting; dependent on UEs for support  Pertinent Vitals/Pain Pain Assessment: 0-10 Pain Score: 4  Pain Location: Mostly L lower leg; lots of trial and error to find a position of relative comfort in recliner Pain Descriptors / Indicators: Aching;Discomfort Pain Intervention(s): Monitored during session;Repositioned    Home Living Family/patient expects to be discharged to:: Private residence Living Arrangements:  Children Available Help at Discharge: Personal care attendant;Family (son stays at night; PCA during day) Type of Home: House Home Access: Ramped entrance     Home Layout: One level Home Equipment: Wheelchair - power;Wheelchair - manual Additional Comments: also has slide board    Prior Function Level of Independence: Needs assistance   Gait / Transfers Assistance Needed: assist in/out of w/c  ADL's / Homemaking Assistance Needed: needing assist to get on/off toilet recently  Comments: Normally living alone and functioning independently; as of recently she required assist from PCA and son staying at night.     Hand Dominance   Dominant Hand: Right    Extremity/Trunk Assessment   Upper Extremity Assessment Upper Extremity Assessment: Defer to OT evaluation    Lower Extremity Assessment Lower Extremity Assessment: RLE deficits/detail;LLE deficits/detail RLE Deficits / Details: Overall weak with MS; notable hip and knee muscle voluntary activiation, though uncoordinated RLE Coordination: decreased gross motor LLE Deficits / Details: Did not observe voluntary movement of LLE; lower leg cast        Communication   Communication: No difficulties  Cognition Arousal/Alertness: Awake/alert Behavior During Therapy: WFL for tasks assessed/performed;Anxious Overall Cognitive Status: Within Functional Limits for tasks assessed (for simple tasks)                                 General Comments: with 2 recent mishaps with transfers resulting in injury, pt is very concerned for plans and safety with transfers      General Comments General comments (skin integrity, edema, etc.): Very anxious with moving/transfers, and required extra time to think through/explain rationale for setup; She is very aware of her personal care needs and we discussed typical toileting habits and options here (she is particularly concerned with what happens if she is in the recliner)     Exercises     Assessment/Plan    PT Assessment Patient needs continued PT services  PT Problem List Decreased strength;Decreased range of motion;Decreased activity tolerance;Decreased balance;Decreased mobility;Decreased coordination;Decreased knowledge of use of DME;Decreased safety awareness;Decreased knowledge of precautions;Pain;Impaired sensation;Impaired tone       PT Treatment Interventions DME instruction;Functional mobility training;Therapeutic activities;Therapeutic exercise;Balance training;Neuromuscular re-education;Cognitive remediation;Patient/family education;Wheelchair mobility training    PT Goals (Current goals can be found in the Care Plan section)  Acute Rehab PT Goals Patient Stated Goal: indicated she plans on going to SNF PT Goal Formulation: With patient Time For Goal Achievement: 10/28/20 Potential to Achieve Goals: Fair    Frequency Min 2X/week   Barriers to discharge        Co-evaluation               AM-PAC PT "6 Clicks" Mobility  Outcome Measure Help needed turning from your back to your side while in a flat bed without using bedrails?: A Lot Help needed moving from lying on your back to sitting on the side of a flat bed without using bedrails?: A Lot Help needed moving to and from a bed to a chair (including a wheelchair)?: Total Help needed standing up from a chair using your arms (e.g., wheelchair or bedside  chair)?: Total Help needed to walk in hospital room?: Total Help needed climbing 3-5 steps with a railing? : Total 6 Click Score: 8    End of Session Equipment Utilized During Treatment: Other (comment) (bed pad) Activity Tolerance: Patient tolerated treatment well Patient left: in chair;with call bell/phone within reach;with chair alarm set;Other (comment) (with lift pad under her) Nurse Communication: Mobility status;Need for lift equipment;Other (comment) (consider using Purewick, esp while in recliner) PT Visit Diagnosis: Other  abnormalities of gait and mobility (R26.89);Pain Pain - Right/Left: Left Pain - part of body: Leg    Time: 9390-3009 PT Time Calculation (min) (ACUTE ONLY): 50 min   Charges:   PT Evaluation $PT Eval Moderate Complexity: 1 Mod PT Treatments $Therapeutic Activity: 8-22 mins        Van Clines, PT  Acute Rehabilitation Services Pager [phone] Office [phone]   Levi Aland 10/14/2020, 2:09 PM

## 2020-10-14 NOTE — Consult Note (Signed)
ORTHOPAEDIC CONSULTATION  REQUESTING PHYSICIAN: [place] Eric J, DO  Chief Complaint: R shoulder pain  HPI: [name] is a 74 y.o. female with history of multiple sclerosis with wheelchair-bound status at baseline.  She had a left tibia ORIF with Dr. Doreatha Martin and is continue to have right shoulder pain.  Patient's requested another opinion about her shoulder as she had seen one of my partners previously.  I was asked to give my opinion on how to help her mobilize more readily due to her injury.  She is worried about the ability to transfer.  Her shoulder is not strong enough at this point for her to do so.  She wants to mobilize as quick as possible to minimize her time in a rehab facility.  She is extremely burdened by her shoulder.  She has tried nonoperative measures but continues to have weakness.  Past Medical History:  Diagnosis Date  . Ankle fracture, left 07/2006  . Ankle fracture, right 10/30/06   [facility]  . Compression fracture 2011   Dr. Mardelle Matte   . Disc herniation    L5, S1  . MS (multiple sclerosis) ([place])    evaluated by Dr. Erling Cruz  . MVP (mitral valve prolapse) 1979   ECHO. Trace MR, trace TR, trace PR 07/17/03  . Nephrolithiasis   . Osteoporosis   . Rib fractures 1998, [id number]   2  . Toe fracture 1998   5   Past Surgical History:  Procedure Laterality Date  . LUMBAR DISC SURGERY  04/2007   L5, S1- [facility]  . SEPTOPLASTY  1983   deviated septum  . TONSILLECTOMY AND ADENOIDECTOMY  1960  . TOTAL ABDOMINAL HYSTERECTOMY W/ BILATERAL SALPINGOOPHORECTOMY  1982   for endometriosis   Social History   Socioeconomic History  . Marital status: Widowed    Spouse name: Not on file  . Number of children: 2  . Years of education: HS  . Highest education level: Not on file  Occupational History  . Occupation: Secretarial work---disabled  Tobacco Use  . Smoking status: Never Smoker  . Smokeless tobacco: Never Used  Substance and Sexual Activity  . Alcohol use: No    . Drug use: No  . Sexual activity: Not on file  Other Topics Concern  . Not on file  Social History Narrative   Widowed 1991.  Lives alone.    Played piano.       No living will   Son Shanon Brow should make decisions for her prn   Would accept resuscitation attempts   No tube feeds if cognitively unaware      Caffeine XBM:WUXLKG decaf   Right handed    Social Determinants of Health   Financial Resource Strain:   . Difficulty of Paying Living Expenses: Not on file  Food Insecurity:   . Worried About Charity fundraiser in the Last Year: Not on file  . Ran Out of Food in the Last Year: Not on file  Transportation Needs:   . Lack of Transportation (Medical): Not on file  . Lack of Transportation (Non-Medical): Not on file  Physical Activity:   . Days of Exercise per Week: Not on file  . Minutes of Exercise per Session: Not on file  Stress:   . Feeling of Stress : Not on file  Social Connections:   . Frequency of Communication with Friends and Family: Not on file  . Frequency of Social Gatherings with Friends and Family: Not on file  .  Attends Religious Services: Not on file  . Active Member of Clubs or Organizations: Not on file  . Attends Archivist Meetings: Not on file  . Marital Status: Not on file   Family History  Problem Relation Age of Onset  . Stroke Mother   . Neuropathy Brother   . Stroke Father   . Hypertension Neg Hx   . Diabetes Neg Hx   . Depression Neg Hx   . Alcohol abuse Neg Hx   . Drug abuse Neg Hx    Allergies  Allergen Reactions  . Procaine Hcl     REACTION: UNSPECIFIED   Prior to Admission medications   Medication Sig Start Date End Date Taking? Authorizing Provider  methenamine (HIPREX) 1 g tablet Take 1 tablet (1 g total) by mouth daily. Patient taking differently: Take 1 g by mouth every other day.  12/14/19  Yes Angiulli, Lavon Paganini, PA-C  cyanocobalamin (,VITAMIN B-12,) 1000 MCG/ML injection Inject 1,000 mcg into the muscle once.  09/05/20   [provider]  oxybutynin (DITROPAN) 5 MG tablet Take 1/2  To 1 pill once or twice a day 07/03/20   Britt Bottom, MD   DG Tibia/Fibula Left  Result Date: 10/13/2020 CLINICAL DATA:  Open reduction internal fixation of left tibial fracture. EXAM: DG C-ARM 1-60 MIN; LEFT TIBIA AND FIBULA - 2 VIEW FLUOROSCOPY TIME:  Fluoroscopy Time:  1 minutes and 5 seconds Number of Acquired Spot Images: 0 COMPARISON:  July 19, 2006 FINDINGS: Intraoperative fluoroscopic images from sideplate and screw fixation of comminuted impacted distal left tibial fracture demonstrate improved alignment and no evidence of immediate complications. There is an associated mildly comminuted distal fibular fracture. IMPRESSION: 1. Intraoperative fluoroscopic images from sideplate and screw fixation of comminuted impacted distal left tibial fracture. 2. Mildly comminuted distal fibular fracture. Electronically Signed   By: Fidela Salisbury M.D.   On: 10/13/2020 12:49   DG Tibia/Fibula Left Port  Result Date: 10/13/2020 CLINICAL DATA:  Distal tibial fracture status post fixation EXAM: PORTABLE LEFT TIBIA AND FIBULA - 2 VIEW COMPARISON:  Tibia and fibula intraoperative radiographs dated 10/13/2020. FINDINGS: An overlying splint obscures bony detail. A fixated comminuted distal tibial fracture with fixation plate and screws is in near anatomic alignment. A comminuted distal fibula fracture is in near anatomic alignment. There is no ankle joint dislocation. There is diffuse bony demineralization. IMPRESSION: Near anatomic alignment of the fixated distal tibial fracture and the comminuted distal fibula fracture. Electronically Signed   By: Zerita Boers M.D.   On: 10/13/2020 16:11   DG C-Arm 1-60 Min  Result Date: 10/13/2020 CLINICAL DATA:  Open reduction internal fixation of left tibial fracture. EXAM: DG C-ARM 1-60 MIN; LEFT TIBIA AND FIBULA - 2 VIEW FLUOROSCOPY TIME:  Fluoroscopy Time:  1 minutes and 5 seconds  Number of Acquired Spot Images: 0 COMPARISON:  July 19, 2006 FINDINGS: Intraoperative fluoroscopic images from sideplate and screw fixation of comminuted impacted distal left tibial fracture demonstrate improved alignment and no evidence of immediate complications. There is an associated mildly comminuted distal fibular fracture. IMPRESSION: 1. Intraoperative fluoroscopic images from sideplate and screw fixation of comminuted impacted distal left tibial fracture. 2. Mildly comminuted distal fibular fracture. Electronically Signed   By: Fidela Salisbury M.D.   On: 10/13/2020 12:49   Family History Reviewed and non-contributory, no pertinent history of problems with bleeding or anesthesia      Review of Systems 14 system ROS conducted and negative except for that noted  in HPI   OBJECTIVE  Vitals: Patient Vitals for the past 8 hrs:  BP Temp Temp src Pulse Resp SpO2  10/14/20 1300 128/65 98 F (36.7 C) -- 68 18 99 %  10/14/20 0813 (!) 105/53 98 F (36.7 C) Oral 67 16 97 %   General: Alert, no acute distress Cardiovascular: Warm extremities noted Respiratory: No cyanosis, no use of accessory musculature GI: No organomegaly, abdomen is soft and non-tender Skin: No lesions in the area of chief complaint other than those listed below in MSK exam.  Neurologic: Sensation intact distally save for the below mentioned MSK exam Psychiatric: Patient is competent for consent with normal mood and affect Lymphatic: No swelling obvious and reported other than the area involved in the exam below Extremities  Right upper extremity: 4 5 supraspinatus strength with pain, active forward elevation 90 passive to 140, distal motor and sensory function is intact otherwise in the upper extremity.    Test Results Imaging MRI reviewed demonstrating supraspinatus tear with mid humeral head retraction.  Labs cbc Recent Labs    10/13/20 2043 10/14/20 1036  WBC 8.1 14.3*  HGB 10.8* 10.8*  HCT 33.5* 34.3*   PLT 444* 383    Labs inflam No results for input(s): CRP in the last 72 hours.  Invalid input(s): ESR  Labs coag Recent Labs    10/12/20 2331  INR 1.0    Recent Labs    10/12/20 2331 10/13/20 2043  NA 142  --   K 4.2  --   CL 104  --   CO2 25  --   GLUCOSE 117*  --   BUN 16  --   CREATININE 0.68 0.78  CALCIUM 9.5  --      ASSESSMENT AND PLAN: 74 y.o. female with the following: Right rotator cuff tear and patient multiple medical comorbidities and need for early mobilization using upper extremities.   First of all we did discuss that the patient is allowed to use her arm as she sees fit currently with no obvious detriment to her shoulder in the long-term.  She is unhappy with this currently and does not feel that she will be able to mobilize well.  She wants to consider what her surgical options are.  A rotator cuff repair would require 6 weeks of immobilization and likely no weightbearing for 3 months total which is likely not able to be performed in the setting of wheelchair-bound status.  One alternative in addition to nonoperative measures would be a reverse total shoulder arthroplasty in which case we could allow the patient to mobilize early.  This is atypical but her situation is not typical as she is using upper extremities for mobilization secondary to her wheelchair-bound status.  We think that with a reverse shoulder arthroplasty she would use a sling while resting but would be okay for transfers immediately and could use her arm for ADLs at waist level immediately.  If the patient's bone quality is poor we may end up having to slow this down however most likely she would do well with mobilization early.  Risk benefits and alternatives including dislocation, instability, infection and periprosthetic fracture were all discussed amongst others.  She understands axillary nerve and neurovascular concerns as well.  She had concerns about anesthesia this close to her  previous surgery however from my experience this would not be of significant concern.  We defer anesthesia choice to her anesthesiologist.  Exparel nerve block may limit her ability to mobilize early  and perhaps a interscalene with a shorter acting medicine may be better.  Alternatively general would be reasonable.  -Plan for surgery with a right reverse total shoulder arthroplasty tomorrow in the evening if the OR schedule allows -N.p.o. at midnight -Consider short acting regional anesthesia or general with local anesthesia but defer to anesthesiologist overall -Would be okay for discharge day after surgery from shoulder perspective. -Will need an IV in her left upper extremity or lower extremity as her right upper extremity IV will not be adequate for surgery.

## 2020-10-14 NOTE — Progress Notes (Addendum)
PROGRESS NOTE    [name]  ZOX:[id number] DOB: March 28, 1946 DOA: 10/12/2020 PCP: Karie Schwalbe, MD    Brief Narrative:  [name] is a 74 year old female with past medical history notable for multiple sclerosis in which she is wheelchair-bound at baseline, MVP who was sent to the emergency department from her orthopedist office for need of operative management for a left tibia/fibular fracture.  Patient initially injured her leg after being caught in the wheelchair and was immobilized with a cast and her orthopedist office.  Patient continues with left lower extremity pain, otherwise no other complaints.  In the ED, WBC count 10.8, hemoglobin 13.7, platelet 548, sodium 142, potassium 4.2, chloride 104, bicarbonate 25, BUN 16, creatinine 0.6, glucose 117.  INR 1.0.  SARS-CoV-2/Covid-19 PCR negative.  Hospitalist service consulted for admission.   Assessment & Plan:   Principal Problem:   Tibia/fibula fracture Active Problems:   Multiple sclerosis (HCC)   Paraplegia, unspecified (HCC)   Neurogenic bladder   Left tibia/fibula fracture Patient presenting to the ED from her orthopedist's office, Dr. Eulah Pont for need of operative management of her acute left tibia/fibula fracture.  Patient is wheelchair-bound at baseline due to her underlying multiple sclerosis and injured her left leg after being caught in the wheelchair causing the leg to twist and fracture.  Splinted in orthopedics office prior to ED arrival.  Patient underwent ORIF of left tibia fracture by Dr. Jena Gauss on 10/13/2020. --PT/OT consult pending --NWB LLE --Norco 7.5-325 mg 1-2 tablets every 4 hours as needed --Morphine 0.5-1 mg every 2 hours as needed severe pain --Robaxin 500 mg p.o. every 6 hours as needed muscle spasm --Vitamin D 25 hydroxy level pending --TOC consult for SNF placement  Multiple sclerosis Paraplegia Neurogenic bladder Follows with outpatient neurology, Dr. Epimenio Foot.  Recently seen on  07/03/2020, no new lesions on MRI and not started on disease modifying therapy.  Patient reports not on oxybutynin as it is listed on her home medications. --Monitor urine output   DVT prophylaxis: Lovenox Code Status: Full code Family Communication: No family present at bedside this morning  Disposition Plan:  Status is: Inpatient  Remains inpatient appropriate because:Ongoing active pain requiring inpatient pain management, Unsafe d/c plan, IV treatments appropriate due to intensity of illness or inability to take PO and Inpatient level of care appropriate due to severity of illness   Dispo: The patient is from: Home              Anticipated d/c is to: SNF              Anticipated d/c date is: 2 days              Patient currently is not medically stable to d/c.   Consultants:   Orthopedics, Dr. Jena Gauss  Procedures:   ORIF left tibia fracture, Dr. Jena Gauss 10/13/2020  Antimicrobials:   Perioperative cefazolin   Subjective: Patient seen and examined bedside, resting comfortably.  Underwent operative management for her leg fracture yesterday.  Discussed with Dr. Jena Gauss today.  Awaiting PT/OT evaluation, with need of SNF placement.  Patient states pain is well controlled.  No other complaints or concerns at this time. Denies headache, no fever/chills/night sweats, no nausea/vomiting/diarrhea, no chest pain, palpitations, no shortness of breath, no abdominal pain.  No acute events overnight per nursing staff.  Labs not drawn this morning  Objective: Vitals:   10/13/20 1937 10/14/20 0034 10/14/20 0447 10/14/20 0813  BP: (!) 93/43 112/66 (!) 98/56 Marland Kitchen)  105/53  Pulse: 83 68 66 67  Resp: 17 15 16 16   Temp: 98.4 F (36.9 C) 98.4 F (36.9 C) 98.7 F (37.1 C) 98 F (36.7 C)  TempSrc: Oral Oral Oral Oral  SpO2: 94% 96% 96% 97%  Weight:      Height:        Intake/Output Summary (Last 24 hours) at 10/14/2020 1053 Last data filed at 10/14/2020 0900 Gross per 24 hour  Intake  3350.17 ml  Output 15 ml  Net 3335.17 ml   Filed Weights   10/12/20 1759  Weight: 51.3 kg    Examination:  General exam: Appears calm and comfortable  Respiratory system: Clear to auscultation. Respiratory effort normal.  Oxygenating well on room air  Cardiovascular system: S1 & S2 heard, RRR. No JVD, murmurs, rubs, gallops or clicks. No pedal edema. Gastrointestinal system: Abdomen is nondistended, soft and nontender. No organomegaly or masses felt. Normal bowel sounds heard. Central nervous system: Alert and oriented. No focal neurological deficits. Extremities: Left lower extremity with splint in place, neurovascular intact Skin: No rashes, lesions or ulcers Psychiatry: Judgement and insight appear normal. Mood & affect appropriate.     Data Reviewed: I have personally reviewed following labs and imaging studies  CBC: Recent Labs  Lab 10/12/20 2331 10/13/20 2043  WBC 10.8* 8.1  HGB 13.7 10.8*  HCT 43.4 33.5*  MCV 96.2 93.8  PLT 548* 444*   Basic Metabolic Panel: Recent Labs  Lab 10/12/20 2331 10/13/20 2043  NA 142  --   K 4.2  --   CL 104  --   CO2 25  --   GLUCOSE 117*  --   BUN 16  --   CREATININE 0.68 0.78  CALCIUM 9.5  --    GFR: Estimated Creatinine Clearance: 50 mL/min (by C-G formula based on SCr of 0.78 mg/dL). Liver Function Tests: No results for input(s): AST, ALT, ALKPHOS, BILITOT, PROT, ALBUMIN in the last 168 hours. No results for input(s): LIPASE, AMYLASE in the last 168 hours. No results for input(s): AMMONIA in the last 168 hours. Coagulation Profile: Recent Labs  Lab 10/12/20 2331  INR 1.0   Cardiac Enzymes: No results for input(s): CKTOTAL, CKMB, CKMBINDEX, TROPONINI in the last 168 hours. BNP (last 3 results) No results for input(s): PROBNP in the last 8760 hours. HbA1C: No results for input(s): HGBA1C in the last 72 hours. CBG: No results for input(s): GLUCAP in the last 168 hours. Lipid Profile: No results for input(s):  CHOL, HDL, LDLCALC, TRIG, CHOLHDL, LDLDIRECT in the last 72 hours. Thyroid Function Tests: No results for input(s): TSH, T4TOTAL, FREET4, T3FREE, THYROIDAB in the last 72 hours. Anemia Panel: No results for input(s): VITAMINB12, FOLATE, FERRITIN, TIBC, IRON, RETICCTPCT in the last 72 hours. Sepsis Labs: No results for input(s): PROCALCITON, LATICACIDVEN in the last 168 hours.  Recent Results (from the past 240 hour(s))  Respiratory Panel by RT PCR (Flu A&B, Covid) - Nasopharyngeal Swab     Status: None   Collection Time: 10/13/20  1:55 AM   Specimen: Nasopharyngeal Swab  Result Value Ref Range Status   SARS Coronavirus 2 by RT PCR NEGATIVE NEGATIVE Final    Comment: (NOTE) SARS-CoV-2 target nucleic acids are NOT DETECTED.  The SARS-CoV-2 RNA is generally detectable in upper respiratoy specimens during the acute phase of infection. The lowest concentration of SARS-CoV-2 viral copies this assay can detect is 131 copies/mL. A negative result does not preclude SARS-Cov-2 infection and should not be used as  the sole basis for treatment or other patient management decisions. A negative result may occur with  improper specimen collection/handling, submission of specimen other than nasopharyngeal swab, presence of viral mutation(s) within the areas targeted by this assay, and inadequate number of viral copies (<131 copies/mL). A negative result must be combined with clinical observations, patient history, and epidemiological information. The expected result is Negative.  Fact Sheet for Patients:  https://www.moore.com/  Fact Sheet for Healthcare Providers:  https://www.young.biz/  This test is no t yet approved or cleared by the [place] FDA and  has been authorized for detection and/or diagnosis of SARS-CoV-2 by FDA under an Emergency Use Authorization (EUA). This EUA will remain  in effect (meaning this test can be used) for the duration of  the COVID-19 declaration under Section 564(b)(1) of the Act, 21 U.S.C. section 360bbb-3(b)(1), unless the authorization is terminated or revoked sooner.     Influenza A by PCR NEGATIVE NEGATIVE Final   Influenza B by PCR NEGATIVE NEGATIVE Final    Comment: (NOTE) The Xpert Xpress SARS-CoV-2/FLU/RSV assay is intended as an aid in  the diagnosis of influenza from Nasopharyngeal swab specimens and  should not be used as a sole basis for treatment. Nasal washings and  aspirates are unacceptable for Xpert Xpress SARS-CoV-2/FLU/RSV  testing.  Fact Sheet for Patients: https://www.moore.com/  Fact Sheet for Healthcare Providers: https://www.young.biz/  This test is not yet approved or cleared by the [place] FDA and  has been authorized for detection and/or diagnosis of SARS-CoV-2 by  FDA under an Emergency Use Authorization (EUA). This EUA will remain  in effect (meaning this test can be used) for the duration of the  Covid-19 declaration under Section 564(b)(1) of the Act, 21  U.S.C. section 360bbb-3(b)(1), unless the authorization is  terminated or revoked. Performed at [facility], [address], [place], [place] [address]   MRSA PCR Screening     Status: None   Collection Time: 10/13/20  3:24 AM   Specimen: Nasal Mucosa; Nasopharyngeal  Result Value Ref Range Status   MRSA by PCR NEGATIVE NEGATIVE Final    Comment:        The GeneXpert MRSA Assay (FDA approved for NASAL specimens only), is one component of a comprehensive MRSA colonization surveillance program. It is not intended to diagnose MRSA infection nor to guide or monitor treatment for MRSA infections. Performed at [facility], [address] [place], [place] [address]          Radiology Studies: DG Tibia/Fibula Left  Result Date: 10/13/2020 CLINICAL DATA:  Open reduction internal fixation of left tibial fracture. EXAM: DG C-ARM 1-60 MIN; LEFT  TIBIA AND FIBULA - 2 VIEW FLUOROSCOPY TIME:  Fluoroscopy Time:  1 minutes and 5 seconds Number of Acquired Spot Images: 0 COMPARISON:  July 19, 2006 FINDINGS: Intraoperative fluoroscopic images from sideplate and screw fixation of comminuted impacted distal left tibial fracture demonstrate improved alignment and no evidence of immediate complications. There is an associated mildly comminuted distal fibular fracture. IMPRESSION: 1. Intraoperative fluoroscopic images from sideplate and screw fixation of comminuted impacted distal left tibial fracture. 2. Mildly comminuted distal fibular fracture. Electronically Signed   By: Ted Mcalpine M.D.   On: 10/13/2020 12:49   DG Tibia/Fibula Left Port  Result Date: 10/13/2020 CLINICAL DATA:  Distal tibial fracture status post fixation EXAM: PORTABLE LEFT TIBIA AND FIBULA - 2 VIEW COMPARISON:  Tibia and fibula intraoperative radiographs dated 10/13/2020. FINDINGS: An overlying splint obscures  bony detail. A fixated comminuted distal tibial fracture with fixation plate and screws is in near anatomic alignment. A comminuted distal fibula fracture is in near anatomic alignment. There is no ankle joint dislocation. There is diffuse bony demineralization. IMPRESSION: Near anatomic alignment of the fixated distal tibial fracture and the comminuted distal fibula fracture. Electronically Signed   By: Romona Curls M.D.   On: 10/13/2020 16:11   DG C-Arm 1-60 Min  Result Date: 10/13/2020 CLINICAL DATA:  Open reduction internal fixation of left tibial fracture. EXAM: DG C-ARM 1-60 MIN; LEFT TIBIA AND FIBULA - 2 VIEW FLUOROSCOPY TIME:  Fluoroscopy Time:  1 minutes and 5 seconds Number of Acquired Spot Images: 0 COMPARISON:  July 19, 2006 FINDINGS: Intraoperative fluoroscopic images from sideplate and screw fixation of comminuted impacted distal left tibial fracture demonstrate improved alignment and no evidence of immediate complications. There is an associated mildly  comminuted distal fibular fracture. IMPRESSION: 1. Intraoperative fluoroscopic images from sideplate and screw fixation of comminuted impacted distal left tibial fracture. 2. Mildly comminuted distal fibular fracture. Electronically Signed   By: Ted Mcalpine M.D.   On: 10/13/2020 12:49        Scheduled Meds: . docusate sodium  100 mg Oral BID  . enoxaparin (LOVENOX) injection  40 mg Subcutaneous Q24H  . methenamine  1 g Oral QODAY  . mupirocin ointment  1 application Nasal BID   Continuous Infusions: . 0.9 % NaCl with KCl 20 mEq / L 75 mL/hr at 10/13/20 1836  .  ceFAZolin (ANCEF) IV 2 g (10/14/20 0557)  . methocarbamol (ROBAXIN) IV       LOS: 1 day    Time spent: 37 minutes spent on chart review, discussion with nursing staff, consultants, updating family and interview/physical exam; more than 50% of that time was spent in counseling and/or coordination of care.    Alvira Philips [place], DO [facility] Available via Epic secure chat 7am-7pm After these hours, please refer to coverage provider listed on [email or web address] 10/14/2020, 10:53 AM

## 2020-10-14 NOTE — Plan of Care (Signed)

## 2020-10-14 NOTE — H&P (View-Only) (Signed)
ORTHOPAEDIC CONSULTATION  REQUESTING PHYSICIAN: [place] Eric J, DO  Chief Complaint: R shoulder pain  HPI: [name] is a 74 y.o. female with history of multiple sclerosis with wheelchair-bound status at baseline.  She had a left tibia ORIF with Dr. Doreatha Martin and is continue to have right shoulder pain.  Patient's requested another opinion about her shoulder as she had seen one of my partners previously.  I was asked to give my opinion on how to help her mobilize more readily due to her injury.  She is worried about the ability to transfer.  Her shoulder is not strong enough at this point for her to do so.  She wants to mobilize as quick as possible to minimize her time in a rehab facility.  She is extremely burdened by her shoulder.  She has tried nonoperative measures but continues to have weakness.  Past Medical History:  Diagnosis Date  . Ankle fracture, left 07/2006  . Ankle fracture, right 10/30/06   [facility]  . Compression fracture 2011   Dr. Mardelle Matte   . Disc herniation    L5, S1  . MS (multiple sclerosis) ([place])    evaluated by Dr. Erling Cruz  . MVP (mitral valve prolapse) 1979   ECHO. Trace MR, trace TR, trace PR 07/17/03  . Nephrolithiasis   . Osteoporosis   . Rib fractures 1998, [id number]   2  . Toe fracture 1998   5   Past Surgical History:  Procedure Laterality Date  . LUMBAR DISC SURGERY  04/2007   L5, S1- [facility]  . SEPTOPLASTY  1983   deviated septum  . TONSILLECTOMY AND ADENOIDECTOMY  1960  . TOTAL ABDOMINAL HYSTERECTOMY W/ BILATERAL SALPINGOOPHORECTOMY  1982   for endometriosis   Social History   Socioeconomic History  . Marital status: Widowed    Spouse name: Not on file  . Number of children: 2  . Years of education: HS  . Highest education level: Not on file  Occupational History  . Occupation: Secretarial work---disabled  Tobacco Use  . Smoking status: Never Smoker  . Smokeless tobacco: Never Used  Substance and Sexual Activity  . Alcohol use: No    . Drug use: No  . Sexual activity: Not on file  Other Topics Concern  . Not on file  Social History Narrative   Widowed 1991.  Lives alone.    Played piano.       No living will   Son Shanon Brow should make decisions for her prn   Would accept resuscitation attempts   No tube feeds if cognitively unaware      Caffeine XBM:WUXLKG decaf   Right handed    Social Determinants of Health   Financial Resource Strain:   . Difficulty of Paying Living Expenses: Not on file  Food Insecurity:   . Worried About Charity fundraiser in the Last Year: Not on file  . Ran Out of Food in the Last Year: Not on file  Transportation Needs:   . Lack of Transportation (Medical): Not on file  . Lack of Transportation (Non-Medical): Not on file  Physical Activity:   . Days of Exercise per Week: Not on file  . Minutes of Exercise per Session: Not on file  Stress:   . Feeling of Stress : Not on file  Social Connections:   . Frequency of Communication with Friends and Family: Not on file  . Frequency of Social Gatherings with Friends and Family: Not on file  .  Attends Religious Services: Not on file  . Active Member of Clubs or Organizations: Not on file  . Attends Archivist Meetings: Not on file  . Marital Status: Not on file   Family History  Problem Relation Age of Onset  . Stroke Mother   . Neuropathy Brother   . Stroke Father   . Hypertension Neg Hx   . Diabetes Neg Hx   . Depression Neg Hx   . Alcohol abuse Neg Hx   . Drug abuse Neg Hx    Allergies  Allergen Reactions  . Procaine Hcl     REACTION: UNSPECIFIED   Prior to Admission medications   Medication Sig Start Date End Date Taking? Authorizing Provider  methenamine (HIPREX) 1 g tablet Take 1 tablet (1 g total) by mouth daily. Patient taking differently: Take 1 g by mouth every other day.  12/14/19  Yes Angiulli, Lavon Paganini, PA-C  cyanocobalamin (,VITAMIN B-12,) 1000 MCG/ML injection Inject 1,000 mcg into the muscle once.  09/05/20   [provider]  oxybutynin (DITROPAN) 5 MG tablet Take 1/2  To 1 pill once or twice a day 07/03/20   Britt Bottom, MD   DG Tibia/Fibula Left  Result Date: 10/13/2020 CLINICAL DATA:  Open reduction internal fixation of left tibial fracture. EXAM: DG C-ARM 1-60 MIN; LEFT TIBIA AND FIBULA - 2 VIEW FLUOROSCOPY TIME:  Fluoroscopy Time:  1 minutes and 5 seconds Number of Acquired Spot Images: 0 COMPARISON:  July 19, 2006 FINDINGS: Intraoperative fluoroscopic images from sideplate and screw fixation of comminuted impacted distal left tibial fracture demonstrate improved alignment and no evidence of immediate complications. There is an associated mildly comminuted distal fibular fracture. IMPRESSION: 1. Intraoperative fluoroscopic images from sideplate and screw fixation of comminuted impacted distal left tibial fracture. 2. Mildly comminuted distal fibular fracture. Electronically Signed   By: Fidela Salisbury M.D.   On: 10/13/2020 12:49   DG Tibia/Fibula Left Port  Result Date: 10/13/2020 CLINICAL DATA:  Distal tibial fracture status post fixation EXAM: PORTABLE LEFT TIBIA AND FIBULA - 2 VIEW COMPARISON:  Tibia and fibula intraoperative radiographs dated 10/13/2020. FINDINGS: An overlying splint obscures bony detail. A fixated comminuted distal tibial fracture with fixation plate and screws is in near anatomic alignment. A comminuted distal fibula fracture is in near anatomic alignment. There is no ankle joint dislocation. There is diffuse bony demineralization. IMPRESSION: Near anatomic alignment of the fixated distal tibial fracture and the comminuted distal fibula fracture. Electronically Signed   By: Zerita Boers M.D.   On: 10/13/2020 16:11   DG C-Arm 1-60 Min  Result Date: 10/13/2020 CLINICAL DATA:  Open reduction internal fixation of left tibial fracture. EXAM: DG C-ARM 1-60 MIN; LEFT TIBIA AND FIBULA - 2 VIEW FLUOROSCOPY TIME:  Fluoroscopy Time:  1 minutes and 5 seconds  Number of Acquired Spot Images: 0 COMPARISON:  July 19, 2006 FINDINGS: Intraoperative fluoroscopic images from sideplate and screw fixation of comminuted impacted distal left tibial fracture demonstrate improved alignment and no evidence of immediate complications. There is an associated mildly comminuted distal fibular fracture. IMPRESSION: 1. Intraoperative fluoroscopic images from sideplate and screw fixation of comminuted impacted distal left tibial fracture. 2. Mildly comminuted distal fibular fracture. Electronically Signed   By: Fidela Salisbury M.D.   On: 10/13/2020 12:49   Family History Reviewed and non-contributory, no pertinent history of problems with bleeding or anesthesia      Review of Systems 14 system ROS conducted and negative except for that noted  in HPI   OBJECTIVE  Vitals: Patient Vitals for the past 8 hrs:  BP Temp Temp src Pulse Resp SpO2  10/14/20 1300 128/65 98 F (36.7 C) -- 68 18 99 %  10/14/20 0813 (!) 105/53 98 F (36.7 C) Oral 67 16 97 %   General: Alert, no acute distress Cardiovascular: Warm extremities noted Respiratory: No cyanosis, no use of accessory musculature GI: No organomegaly, abdomen is soft and non-tender Skin: No lesions in the area of chief complaint other than those listed below in MSK exam.  Neurologic: Sensation intact distally save for the below mentioned MSK exam Psychiatric: Patient is competent for consent with normal mood and affect Lymphatic: No swelling obvious and reported other than the area involved in the exam below Extremities  Right upper extremity: 4 5 supraspinatus strength with pain, active forward elevation 90 passive to 140, distal motor and sensory function is intact otherwise in the upper extremity.    Test Results Imaging MRI reviewed demonstrating supraspinatus tear with mid humeral head retraction.  Labs cbc Recent Labs    10/13/20 2043 10/14/20 1036  WBC 8.1 14.3*  HGB 10.8* 10.8*  HCT 33.5* 34.3*   PLT 444* 383    Labs inflam No results for input(s): CRP in the last 72 hours.  Invalid input(s): ESR  Labs coag Recent Labs    10/12/20 2331  INR 1.0    Recent Labs    10/12/20 2331 10/13/20 2043  NA 142  --   K 4.2  --   CL 104  --   CO2 25  --   GLUCOSE 117*  --   BUN 16  --   CREATININE 0.68 0.78  CALCIUM 9.5  --      ASSESSMENT AND PLAN: 74 y.o. female with the following: Right rotator cuff tear and patient multiple medical comorbidities and need for early mobilization using upper extremities.   First of all we did discuss that the patient is allowed to use her arm as she sees fit currently with no obvious detriment to her shoulder in the long-term.  She is unhappy with this currently and does not feel that she will be able to mobilize well.  She wants to consider what her surgical options are.  A rotator cuff repair would require 6 weeks of immobilization and likely no weightbearing for 3 months total which is likely not able to be performed in the setting of wheelchair-bound status.  One alternative in addition to nonoperative measures would be a reverse total shoulder arthroplasty in which case we could allow the patient to mobilize early.  This is atypical but her situation is not typical as she is using upper extremities for mobilization secondary to her wheelchair-bound status.  We think that with a reverse shoulder arthroplasty she would use a sling while resting but would be okay for transfers immediately and could use her arm for ADLs at waist level immediately.  If the patient's bone quality is poor we may end up having to slow this down however most likely she would do well with mobilization early.  Risk benefits and alternatives including dislocation, instability, infection and periprosthetic fracture were all discussed amongst others.  She understands axillary nerve and neurovascular concerns as well.  She had concerns about anesthesia this close to her  previous surgery however from my experience this would not be of significant concern.  We defer anesthesia choice to her anesthesiologist.  Exparel nerve block may limit her ability to mobilize early  and perhaps a interscalene with a shorter acting medicine may be better.  Alternatively general would be reasonable.  -Plan for surgery with a right reverse total shoulder arthroplasty tomorrow in the evening if the OR schedule allows -N.p.o. at midnight -Consider short acting regional anesthesia or general with local anesthesia but defer to anesthesiologist overall -Would be okay for discharge day after surgery from shoulder perspective. -Will need an IV in her left upper extremity or lower extremity as her right upper extremity IV will not be adequate for surgery.

## 2020-10-14 NOTE — Progress Notes (Signed)
Ortho Progress Note  Doing well. No complaints with leg. Asking about her shoulder and timing for a surgery.  NAD LLE: Splint in place, c/d/i. Wiggles toes. Neuro at baseline  Imaging: stable postop imaging  A/P 74 yo w/ MS s/p ORIF of distal tibia 10/30  NWB LLE Splint until follow up Lovenox for VTE PT/OT but will need SNF Will reach out to shoulder specialist for her shoulder Appreciate hospitalist assistance  Roby Lofts, MD [facility] [phone] (office) [email or web address]

## 2020-10-15 ENCOUNTER — Inpatient Hospital Stay (HOSPITAL_COMMUNITY): Payer: Medicare Other | Admitting: Anesthesiology

## 2020-10-15 ENCOUNTER — Encounter (HOSPITAL_COMMUNITY): Payer: Self-pay | Admitting: Student

## 2020-10-15 ENCOUNTER — Encounter (HOSPITAL_COMMUNITY)
Admission: EM | Disposition: A | Discharge status: Skilled Nursing Facility | Payer: Self-pay | Source: Ambulatory Visit | Attending: Internal Medicine

## 2020-10-15 ENCOUNTER — Inpatient Hospital Stay (HOSPITAL_COMMUNITY): Payer: Medicare Other

## 2020-10-15 HISTORY — PX: REVERSE SHOULDER ARTHROPLASTY: SHX5054

## 2020-10-15 LAB — CBC
HCT: 35.7 % — ABNORMAL LOW (ref 36.0–46.0)
Hemoglobin: 11.3 g/dL — ABNORMAL LOW (ref 12.0–15.0)
MCH: 29.7 pg (ref 26.0–34.0)
MCHC: 31.7 g/dL (ref 30.0–36.0)
MCV: 93.9 fL (ref 80.0–100.0)
Platelets: 477 10*3/uL — ABNORMAL HIGH (ref 150–400)
RBC: 3.8 MIL/uL — ABNORMAL LOW (ref 3.87–5.11)
RDW: 11.5 % (ref 11.5–15.5)
WBC: 9.8 10*3/uL (ref 4.0–10.5)
nRBC: 0 % (ref 0.0–0.2)

## 2020-10-15 LAB — BASIC METABOLIC PANEL
Anion gap: 8 (ref 5–15)
BUN: 10 mg/dL (ref 8–23)
CO2: 25 mmol/L (ref 22–32)
Calcium: 8.9 mg/dL (ref 8.9–10.3)
Chloride: 107 mmol/L (ref 98–111)
Creatinine, Ser: 0.63 mg/dL (ref 0.44–1.00)
GFR, Estimated: >60 mL/min (ref >60)
Glucose, Bld: 89 mg/dL (ref 70–99)
Potassium: 4.2 mmol/L (ref 3.5–5.1)
Sodium: 140 mmol/L (ref 135–145)

## 2020-10-15 SURGERY — ARTHROPLASTY, SHOULDER, TOTAL, REVERSE
Anesthesia: General | Site: Shoulder | Laterality: Right

## 2020-10-15 MED ORDER — OXYCODONE HCL 5 MG/5ML PO SOLN: 5.0000 mg | Freq: Once | ORAL | Status: DC | PRN

## 2020-10-15 MED ORDER — ROCURONIUM BROMIDE 10 MG/ML (PF) SYRINGE
PREFILLED_SYRINGE | INTRAVENOUS | Status: DC | PRN
  Administered 2020-10-15: 40 mg via INTRAVENOUS

## 2020-10-15 MED ORDER — LACTATED RINGERS IV SOLN: INTRAVENOUS | Status: DC | PRN

## 2020-10-15 MED ORDER — DEXAMETHASONE SODIUM PHOSPHATE 10 MG/ML IJ SOLN
INTRAMUSCULAR | Status: DC | PRN
  Administered 2020-10-15: 10 mg via INTRAVENOUS

## 2020-10-15 MED ORDER — ONDANSETRON HCL 4 MG/2ML IJ SOLN
INTRAMUSCULAR | Status: AC
  Filled 2020-10-15: qty 2

## 2020-10-15 MED ORDER — ACETAMINOPHEN 10 MG/ML IV SOLN: 1000.0000 mg | Freq: Once | INTRAVENOUS | Status: DC | PRN

## 2020-10-15 MED ORDER — PHENYLEPHRINE 40 MCG/ML (10ML) SYRINGE FOR IV PUSH (FOR BLOOD PRESSURE SUPPORT)
PREFILLED_SYRINGE | INTRAVENOUS | Status: DC | PRN
  Administered 2020-10-15: 120 ug via INTRAVENOUS

## 2020-10-15 MED ORDER — FENTANYL CITRATE (PF) 100 MCG/2ML IJ SOLN
INTRAMUSCULAR | Status: AC
  Filled 2020-10-15: qty 2

## 2020-10-15 MED ORDER — SUGAMMADEX SODIUM 200 MG/2ML IV SOLN
INTRAVENOUS | Status: DC | PRN
  Administered 2020-10-15: 200 mg via INTRAVENOUS

## 2020-10-15 MED ORDER — LIDOCAINE 2% (20 MG/ML) 5 ML SYRINGE
INTRAMUSCULAR | Status: DC | PRN
  Administered 2020-10-15: 60 mg via INTRAVENOUS

## 2020-10-15 MED ORDER — OXYCODONE HCL 5 MG PO TABS: 5.0000 mg | ORAL_TABLET | Freq: Once | ORAL | Status: DC | PRN

## 2020-10-15 MED ORDER — LIDOCAINE 2% (20 MG/ML) 5 ML SYRINGE
INTRAMUSCULAR | Status: AC
  Filled 2020-10-15: qty 5

## 2020-10-15 MED ORDER — ONDANSETRON HCL 4 MG/2ML IJ SOLN
INTRAMUSCULAR | Status: DC | PRN
  Administered 2020-10-15: 4 mg via INTRAVENOUS

## 2020-10-15 MED ORDER — DEXAMETHASONE SODIUM PHOSPHATE 10 MG/ML IJ SOLN
INTRAMUSCULAR | Status: AC
  Filled 2020-10-15: qty 1

## 2020-10-15 MED ORDER — ROCURONIUM BROMIDE 10 MG/ML (PF) SYRINGE
PREFILLED_SYRINGE | INTRAVENOUS | Status: AC
  Filled 2020-10-15: qty 10

## 2020-10-15 MED ORDER — ROPIVACAINE HCL 5 MG/ML IJ SOLN
INTRAMUSCULAR | Status: DC | PRN
  Administered 2020-10-15: 30 mL via PERINEURAL

## 2020-10-15 MED ORDER — CEFAZOLIN SODIUM-DEXTROSE 2-4 GM/100ML-% IV SOLN
2.0000 g | Freq: Once | INTRAVENOUS | Status: AC
  Administered 2020-10-15: 2 g via INTRAVENOUS

## 2020-10-15 MED ORDER — CEFAZOLIN SODIUM-DEXTROSE 2-4 GM/100ML-% IV SOLN
INTRAVENOUS | Status: AC
  Filled 2020-10-15: qty 100

## 2020-10-15 MED ORDER — POTASSIUM CHLORIDE IN NACL 20-0.9 MEQ/L-% IV SOLN
INTRAVENOUS | Status: DC | PRN
  Filled 2020-10-15: qty 1000

## 2020-10-15 MED ORDER — FENTANYL CITRATE (PF) 250 MCG/5ML IJ SOLN
INTRAMUSCULAR | Status: DC | PRN
  Administered 2020-10-15 (×3): 25 ug via INTRAVENOUS

## 2020-10-15 MED ORDER — HYDROMORPHONE HCL 1 MG/ML IJ SOLN: 0.2500 mg | INTRAMUSCULAR | Status: DC | PRN

## 2020-10-15 MED ORDER — PROPOFOL 10 MG/ML IV BOLUS
INTRAVENOUS | Status: DC | PRN
  Administered 2020-10-15: 100 mg via INTRAVENOUS

## 2020-10-15 MED ORDER — LACTATED RINGERS IV SOLN: INTRAVENOUS | Status: DC

## 2020-10-15 MED ORDER — SODIUM CHLORIDE 0.9 % IR SOLN
Status: DC | PRN
  Administered 2020-10-15: 1000 mL

## 2020-10-15 MED ORDER — FENTANYL CITRATE (PF) 250 MCG/5ML IJ SOLN
INTRAMUSCULAR | Status: AC
  Filled 2020-10-15: qty 5

## 2020-10-15 MED ORDER — PROPOFOL 10 MG/ML IV BOLUS
INTRAVENOUS | Status: AC
  Filled 2020-10-15: qty 20

## 2020-10-15 MED ORDER — CHLORHEXIDINE GLUCONATE 0.12 % MT SOLN
15.0000 mL | Freq: Once | OROMUCOSAL | Status: AC
  Administered 2020-10-15: 15 mL via OROMUCOSAL
  Filled 2020-10-15: qty 15

## 2020-10-15 MED ORDER — VANCOMYCIN HCL 1000 MG IV SOLR
INTRAVENOUS | Status: AC
  Filled 2020-10-15: qty 1000

## 2020-10-15 MED ORDER — VANCOMYCIN HCL 1000 MG IV SOLR
INTRAVENOUS | Status: DC | PRN
  Administered 2020-10-15: 1000 mg via TOPICAL

## 2020-10-15 MED ORDER — PHENYLEPHRINE HCL-NACL 10-0.9 MG/250ML-% IV SOLN
INTRAVENOUS | Status: DC | PRN
  Administered 2020-10-15: 75 ug/min via INTRAVENOUS

## 2020-10-15 MED ORDER — MIDAZOLAM HCL 2 MG/2ML IJ SOLN
INTRAMUSCULAR | Status: AC
  Filled 2020-10-15: qty 2

## 2020-10-15 MED ORDER — 0.9 % SODIUM CHLORIDE (POUR BTL) OPTIME
TOPICAL | Status: DC | PRN
  Administered 2020-10-15: 1000 mL

## 2020-10-15 SURGICAL SUPPLY — 60 items
AID PSTN UNV HD RSTRNT DISP (MISCELLANEOUS) ×1
APL PRP STRL LF DISP 70% ISPRP (MISCELLANEOUS) ×2
BASEPLATE GLENOSPHERE 25 STD (Miscellaneous) ×1 IMPLANT
BIT DRILL 3.2 PERIPHERAL SCREW (BIT) ×1 IMPLANT
BLADE SAW SAG 73X25 THK (BLADE) ×1
BLADE SAW SGTL 73X25 THK (BLADE) ×1 IMPLANT
BSPLAT GLND STD 25 RVRS SHLDR (Miscellaneous) ×1 IMPLANT
CHLORAPREP W/TINT 26 (MISCELLANEOUS) ×4 IMPLANT
COVER SURGICAL LIGHT HANDLE (MISCELLANEOUS) ×2 IMPLANT
DRAPE HALF SHEET 40X57 (DRAPES) ×2 IMPLANT
DRAPE INCISE IOBAN 66X45 STRL (DRAPES) ×4 IMPLANT
DRAPE ORTHO SPLIT 77X108 STRL (DRAPES) ×4
DRAPE SURG ORHT 6 SPLT 77X108 (DRAPES) ×2 IMPLANT
DRAPE SWITCH (DRAPES) ×2 IMPLANT
DRAPE U-SHAPE 47X51 STRL (DRAPES) ×2 IMPLANT
DRSG AQUACEL AG ADV 3.5X 6 (GAUZE/BANDAGES/DRESSINGS) ×1 IMPLANT
ELECT REM PT RETURN 9FT ADLT (ELECTROSURGICAL) ×2
ELECTRODE REM PT RTRN 9FT ADLT (ELECTROSURGICAL) ×1 IMPLANT
GLENOSPHERE REV SHOULDER 36 (Joint) ×1 IMPLANT
GLOVE BIO SURGEON STRL SZ 6.5 (GLOVE) ×2 IMPLANT
GLOVE BIOGEL PI IND STRL 8 (GLOVE) ×1 IMPLANT
GLOVE BIOGEL PI INDICATOR 8 (GLOVE) ×1
GLOVE ECLIPSE 8.0 STRL XLNG CF (GLOVE) ×4 IMPLANT
GLOVE INDICATOR 6.5 STRL GRN (GLOVE) ×2 IMPLANT
GOWN STRL REUS W/ TWL LRG LVL3 (GOWN DISPOSABLE) ×1 IMPLANT
GOWN STRL REUS W/TWL LRG LVL3 (GOWN DISPOSABLE) ×2
GUIDEWIRE GLENOID 2.5X220 (WIRE) ×1 IMPLANT
HANDPIECE INTERPULSE COAX TIP (DISPOSABLE) ×2
IMPL REVERSE SHOULDER 0X3.5 (Shoulder) IMPLANT
IMPLANT REVERSE SHOULDER 0X3.5 (Shoulder) ×2 IMPLANT
INSERT HUMERAL 36X6MM 12.5DEG (Insert) ×1 IMPLANT
KIT BASIN OR (CUSTOM PROCEDURE TRAY) ×2 IMPLANT
KIT STABILIZATION SHOULDER (MISCELLANEOUS) ×2 IMPLANT
KIT TURNOVER KIT B (KITS) ×2 IMPLANT
MANIFOLD NEPTUNE II (INSTRUMENTS) ×2 IMPLANT
NS IRRIG 1000ML POUR BTL (IV SOLUTION) ×2 IMPLANT
PACK SHOULDER (CUSTOM PROCEDURE TRAY) ×2 IMPLANT
PAD ARMBOARD 7.5X6 YLW CONV (MISCELLANEOUS) ×4 IMPLANT
RESTRAINT HEAD UNIVERSAL NS (MISCELLANEOUS) ×2 IMPLANT
SCREW 5.0X38 SMALL F/PERFORM (Screw) ×1 IMPLANT
SCREW BONE 6.5X40 SM (Screw) ×1 IMPLANT
SCREW PERIPHERAL 5.0X34 (Screw) ×1 IMPLANT
SET HNDPC FAN SPRY TIP SCT (DISPOSABLE) ×1 IMPLANT
SPONGE LAP 18X18 RF (DISPOSABLE) ×2 IMPLANT
STEM HUMERAL SZ2B STND 70 PTC (Stem) ×2 IMPLANT
STEM HUMERAL SZ2BSTD 70 PTC (Stem) IMPLANT
SUCTION FRAZIER HANDLE 10FR (MISCELLANEOUS) ×2
SUCTION TUBE FRAZIER 10FR DISP (MISCELLANEOUS) IMPLANT
SUT ETHIBOND 2 V 37 (SUTURE) ×3 IMPLANT
SUT ETHIBOND NAB CT1 #1 30IN (SUTURE) ×3 IMPLANT
SUT FIBERWIRE #5 38 CONV NDL (SUTURE) ×8
SUT MNCRL AB 3-0 PS2 18 (SUTURE) ×2 IMPLANT
SUT MNCRL AB 4-0 PS2 18 (SUTURE) ×1 IMPLANT
SUT VIC AB 2-0 CT1 27 (SUTURE) ×2
SUT VIC AB 2-0 CT1 TAPERPNT 27 (SUTURE) ×1 IMPLANT
SUT VIC AB 3-0 SH 27 (SUTURE) ×2
SUT VIC AB 3-0 SH 27XBRD (SUTURE) IMPLANT
SUTURE FIBERWR #5 38 CONV NDL (SUTURE) ×4 IMPLANT
TAPE STRIPS DRAPE STRL (GAUZE/BANDAGES/DRESSINGS) ×1 IMPLANT
TOWEL GREEN STERILE (TOWEL DISPOSABLE) ×2 IMPLANT

## 2020-10-15 NOTE — Op Note (Signed)
Orthopaedic Surgery Operative Note (CSN: [id number])  [name]  Jan 15, 1946 Date of Surgery: 10/12/2020 - 10/15/2020   Diagnoses:  Right rotator cuff tear  Procedure: Right reverse total Shoulder Arthroplasty   Operative Finding Successful completion of planned procedure.  These were unique indications of the patient needed a reverse so that she can mobilize early in the setting of her lower extremity neurologic compromise.  She would not of done well with a cuff repair.  Her upper bone quality was moderate considering her lower extremity bone quality was very poor.  Post-operative plan: The patient will be allowed to use her arm for ADLs in front of the body at abdominal level and transfers immediately in sling for all other activities including sleep.  The patient should avoid shoulder extension at all cost.  The patient will be Readmitted to the floor but appropriate for discharge from a shoulder perspective tomorrow  DVT prophylaxis per Dr. Tama Headings team.  Pain control with PRN pain medication preferring oral medicines.  Follow up plan will be scheduled in approximately 7 days for incision check and XR.  Physical therapy to start after first visit.  Implants: Tornier 2B flex stem, 0 high offset tray with a 36+6 mm poly-, 36 glenosphere with a 25 baseplate and a 40 center screw with 2 peripheral locking screws.  Additional peripheral nonlocking screws were not felt to help as the patient's bone and glenoid anatomy would not supported very much in the way of fixation from these.  Post-Op Diagnosis: Same Surgeons:Primary: Hiram Gash, MD Assistants:Caroline McBane PA-C Location: [facility] OR ROOM 05 Anesthesia: General with Interscalene Antibiotics: Ancef 2g preop, Vancomycin 1022m locally Tourniquet time: None Estimated Blood Loss: 1654Complications: None Specimens: None Implants: Implant Name Type Inv. Item Serial No. Manufacturer Lot No. LRB No. Used Action  GLENOSPHERE REV  SHOULDER 36 - [id number] GLENOSPHERE REV SHOULDER 36 [id number]  Right 1 Implanted  BASEPLATE GLENOSPHERE 267RFSTD - [id number] BASEPLATE GLENOSPHERE [id number] [id number]  Right 1 Implanted  SCREW BONE 6.5X40 SM - [id number] SCREW BONE 6.5X40 SM  [employer]  Right 1 Implanted  SCREW 5.0X38 SMALL F/PERFORM - [id number] SCREW 5.0X38 SMALL F/PERFORM  [employer]  Right 1 Implanted  SCREW PERIPHERAL 5.0X34 - [id number] SCREW PERIPHERAL 5.0X34  [employer]  Right 1 Implanted  STEM HUMERAL SZ2B STND 70 PTC - [id number] STEM HUMERAL SZ2B STND 70 PTC [id number] [employer]  Right 1 Implanted  IMPLANT REVERSE SHOULDER 0X3.5 - [id number] IMPLANT REVERSE SHOULDER 0X3.5 [id number]  Right 1 Implanted  INSERT HUMERAL 36X6MM 12.5DEG - [id number] INSERT HUMERAL 36X6MM 12.5DEG [id number]  Right 1 Implanted    Indications for Surgery:   Kimberly DKYLEI PURINGTONis a 74y.o. female with fall resulting in a lower extremity injury in the setting of multiple sclerosis and wheelchair-bound state.  She had a unique indication for surgery as she had a rotator cuff tear that was preventing her from mobilizing.  We talked about nonoperative measures but she was not able to mobilize and we felt that a reverse may allow her to weight-bear through her extremity sooner than a cuff repair.  We talked at risk including dislocation, periprosthetic fracture, hardware malfunction, neurovascular injury amongst others.    Procedure:   The patient was identified in the preoperative holding area where the surgical site was marked. Block placed by anesthesia with exparel.  The patient was taken to  the OR where a procedural timeout was called and the above noted anesthesia was induced.  The patient was positioned beachchair on allen table with spider arm positioner.  Preoperative antibiotics were dosed.  The patient's right shoulder was prepped  and draped in the usual sterile fashion.  A second preoperative timeout was called.       Standard deltopectoral approach was performed with a #10 blade. We dissected down to the subcutaneous tissues and the cephalic vein was taken laterally with the deltoid. Clavipectoral fascia was incised in line with the incision. Deep retractors were placed. The long of the biceps tendon was identified and there was significant tenosynovitis present.  Tenodesis was performed to the pectoralis tendon with #2 Ethibond. The remaining biceps was followed up into the rotator interval where it was released.   The subscapularis was taken down in a full thickness layer with capsule along the humeral neck extending inferiorly around the humeral head. We continued releasing the capsule directly off of the osteophytes inferiorly all the way around the corner. This allowed [place] to dislocate the humeral head.   The rotator cuff was carefully examined and noted to be irreperably torn.  The decision was confirmed that a reverse total shoulder was indicated for this patient.  There were osteophytes along the inferior humeral neck. The osteophytes were removed with an osteotome and a rongeur.  Osteophytes were removed with a rongeur and an osteotome and the anatomic neck was well visualized.     A humeral cutting guide was inserted down the intramedullary canal. The version was set at 20 of retroversion. Humeral osteotomy was performed with an oscillating saw. The head fragment was passed off the back table. A starter awl was used to open the humeral canal. We next used T-handle straight sound reamers to ream up to an appropriate fit. A chisel was used to remove proximal humeral bone. We then broached starting with a size one broach and broaching up to 2B which obtained an appropriate fit. The broach handle was removed. A cut protector was placed. The broach handle was removed and a cut protector was placed. The humerus was retracted  posteriorly and we turned our attention to glenoid exposure.  The subscapularis was again identified and immediately we took care to palpate the axillary nerve anteriorly and verify its position with gentle palpation as well as the tug test.  We then released the SGHL with bovie cautery prior to placing a curved mayo at the junction of the anterior glenoid well above the axillary nerve and bluntly dissecting the subscapularis from the capsule.  We then carefully protected the axillary nerve as we gently released the inferior capsule to fully mobilize the subscapularis.  An anterior deltoid retractor was then placed as well as a small Hohmann retractor superiorly.   The glenoid cartilage is preserved in this rotator cuff tear case. The remaining labrum was removed circumferentially taking great care not to disrupt the posterior capsule.   The glenoid drill guide was placed and used to drill a guide pin in the center, inferior position. The glenoid face was then reamed concentrically over the guide wire. The center hole was drilled over the guidepin in a near anatomic angle of version. Next the  glenoid vault was drilled back to a depth of 40 mm.  We tapped and then placed a 43m size baseplate with 264mlateralization was selected with a 6.5 mm x 40 mm length central screw.  The base plate was screwed into  the glenoid vault obtaining secure fixation. We next placed superior and inferior locking screws for additional fixation.  Next a 36 mm glenosphere was selected and impacted onto the baseplate. The center screw was tightened.  We turned attention back to the humeral side. The cut protector was removed. We trialed with multiple size tray and polyethylene options and selected a 6 which provided good stability and range of motion without excess soft tissue tension. The offset was dialed in to match the normal anatomy. The shoulder was trialed.  There was good ROM in all planes and the shoulder was stable with  no inferior translation.  The real humeral implants were opened after again confirming sizes.  The trial was removed. #5 Fiberwire x4 sutures passed through the humeral neck for subscap repair. The humeral component was press-fit obtaining a secure fit. A +0 high offset tray was selected and impacted onto the stem.  A 36+6 polyethylene liner was impacted onto the stem.  The joint was reduced and thoroughly irrigated with pulsatile lavage. Subscap was repaired back with #5 Fiberwire sutures through bone tunnels. Hemostasis was obtained. The deltopectoral interval was reapproximated with #1 Ethibond. The subcutaneous tissues were closed with 2-0 Vicryl and the skin was closed with running monocryl.    The wounds were cleaned and dried and an Aquacel dressing was placed. The drapes taken down. The arm was placed into sling with abduction pillow. Patient was awakened, extubated, and transferred to the recovery room in stable condition. There were no intraoperative complications. The sponge, needle, and attention counts were  correct at the end of the case.        Noemi Chapel, PA-C, present and scrubbed throughout the case, critical for completion in a timely fashion, and for retraction, instrumentation, closure.

## 2020-10-15 NOTE — Anesthesia Procedure Notes (Signed)
Anesthesia Regional Block: Interscalene brachial plexus block   Pre-Anesthetic Checklist: ,, timeout performed, Correct Patient, Correct Site, Correct Laterality, Correct Procedure, Correct Position, site marked, Risks and benefits discussed,  Surgical consent,  Pre-op evaluation,  At surgeon's request and post-op pain management  Laterality: Right  Prep: chloraprep       Needles:  Injection technique: Single-shot  Needle Type: Echogenic Needle     Needle Length: 9cm      Additional Needles:   Procedures:,,,, ultrasound used (permanent image in chart),,,,  Narrative:  Start time: 10/15/2020 4:12 PM End time: 10/15/2020 4:21 PM Injection made incrementally with aspirations every 5 mL.  Performed by: Personally  Anesthesiologist: Eilene Ghazi, MD  Additional Notes: Patient tolerated the procedure well without complications

## 2020-10-15 NOTE — Anesthesia Procedure Notes (Signed)
Procedure Name: Intubation Date/Time: 10/15/2020 5:04 PM Performed by: Modena Morrow, CRNA Pre-anesthesia Checklist: Patient identified, Emergency Drugs available, Suction available and Patient being monitored Patient Re-evaluated:Patient Re-evaluated prior to induction Oxygen Delivery Method: Circle system utilized Preoxygenation: Pre-oxygenation with 100% oxygen Induction Type: IV induction Ventilation: Mask ventilation without difficulty Laryngoscope Size: Miller and 3 Grade View: Grade I Tube type: Oral Tube size: 7.0 mm Number of attempts: 1 Airway Equipment and Method: Stylet and Oral airway Placement Confirmation: ETT inserted through vocal cords under direct vision,  positive ETCO2 and breath sounds checked- equal and bilateral Secured at: 21 cm Tube secured with: Tape Dental Injury: Teeth and Oropharynx as per pre-operative assessment  Comments: SRNA DVL x2. CRNA DVL x1.

## 2020-10-15 NOTE — Transfer of Care (Signed)
Immediate Anesthesia Transfer of Care Note  Patient: [name]  Procedure(s) Performed: REVERSE SHOULDER ARTHROPLASTY (Right Shoulder)  Patient Location: PACU  Anesthesia Type:General  Level of Consciousness: drowsy and patient cooperative  Airway & Oxygen Therapy: Patient Spontanous Breathing  Post-op Assessment: Report given to RN and Post -op Vital signs reviewed and stable  Post vital signs: Reviewed and stable  Last Vitals:  Vitals Value Taken Time  BP 108/57 10/15/20 1816  Temp    Pulse 72 10/15/20 1818  Resp 23 10/15/20 1818  SpO2 96 % 10/15/20 1818  Vitals shown include unvalidated device data.  Last Pain:  Vitals:   10/15/20 0837  TempSrc:   PainSc: 0-No pain      Patients Stated Pain Goal: 2 (10/13/20 2056)  Complications: No complications documented.

## 2020-10-15 NOTE — Progress Notes (Signed)
PROGRESS NOTE    [name]  PPJ:[id number] DOB: 1946/03/10 DOA: 10/12/2020 PCP: Karie Schwalbe, MD    Brief Narrative:  [name] is a 74 year old female with past medical history notable for multiple sclerosis in which she is wheelchair-bound at baseline, MVP who was sent to the emergency department from her orthopedist office for need of operative management for a left tibia/fibular fracture.  Patient initially injured her leg after being caught in the wheelchair and was immobilized with a cast and her orthopedist office.  Patient continues with left lower extremity pain, otherwise no other complaints.  In the ED, WBC count 10.8, hemoglobin 13.7, platelet 548, sodium 142, potassium 4.2, chloride 104, bicarbonate 25, BUN 16, creatinine 0.6, glucose 117.  INR 1.0.  SARS-CoV-2/Covid-19 PCR negative.  Hospitalist service consulted for admission.   Assessment & Plan:   Principal Problem:   Tibia/fibula fracture Active Problems:   Multiple sclerosis (HCC)   Paraplegia, unspecified (HCC)   Neurogenic bladder   Left tibia/fibula fracture Patient presenting to the ED from her orthopedist's office, Dr. Eulah Pont for need of operative management of her acute left tibia/fibula fracture.  Patient is wheelchair-bound at baseline due to her underlying multiple sclerosis and injured her left leg after being caught in the wheelchair causing the leg to twist and fracture.  Splinted in orthopedics office prior to ED arrival.  Patient underwent ORIF of left tibia fracture by Dr. Jena Gauss on 10/13/2020. --NWB LLE --Norco 7.5-325 mg 1-2 tablets every 4 hours as needed --Morphine 0.5-1 mg every 2 hours as needed severe pain --Robaxin 500 mg p.o. every 6 hours as needed muscle spasm --TOC consult for SNF placement; patient prefers [facility] or [facility]  Right shoulder rotator cuff tear --Plan for right reverse total shoulder arthroplasty this afternoon by Dr. Everardo Pacific  Multiple  sclerosis Paraplegia Neurogenic bladder Follows with outpatient neurology, Dr. Epimenio Foot.  Recently seen on 07/03/2020, no new lesions on MRI and not started on disease modifying therapy.  Patient reports not on oxybutynin as it is listed on her home medications. --Monitor urine output   DVT prophylaxis: Lovenox Code Status: Full code Family Communication: No family present at bedside this morning  Disposition Plan:  Status is: Inpatient  Remains inpatient appropriate because:Ongoing active pain requiring inpatient pain management, Unsafe d/c plan, IV treatments appropriate due to intensity of illness or inability to take PO and Inpatient level of care appropriate due to severity of illness   Dispo: The patient is from: Home              Anticipated d/c is to: SNF              Anticipated d/c date is: 1 day              Patient currently is not medically stable to d/c.   Consultants:   Orthopedics, Dr. Jena Gauss  Procedures:   ORIF left tibia fracture, Dr. Jena Gauss 10/13/2020  Antimicrobials:   Perioperative cefazolin   Subjective: Patient seen and examined bedside, resting comfortably. Pain is well controlled.  Plan for right shoulder total arthroplasty by Dr. Everardo Pacific this afternoon for rotator cuff tear.  No other complaints or concerns at this time. Denies headache, no fever/chills/night sweats, no nausea/vomiting/diarrhea, no chest pain, palpitations, no shortness of breath, no abdominal pain.  No acute events overnight per nursing staff.  Objective: Vitals:   10/14/20 1602 10/14/20 1927 10/15/20 0300 10/15/20 0800  BP: (!) 103/55 (!) 120/53 (!) 114/59 130/60  Pulse:  81 86 71 74  Resp: 16  17 16   Temp: 98.4 F (36.9 C) 98.5 F (36.9 C) 98.7 F (37.1 C) 98.2 F (36.8 C)  TempSrc: Oral Oral Oral Oral  SpO2: 98% 98% 97% 95%  Weight:      Height:        Intake/Output Summary (Last 24 hours) at 10/15/2020 1022 Last data filed at 10/15/2020 0900 Gross per 24 hour  Intake  1103.72 ml  Output 2000 ml  Net -896.28 ml   Filed Weights   10/12/20 1759  Weight: 51.3 kg    Examination:  General exam: Appears calm and comfortable  Respiratory system: Clear to auscultation. Respiratory effort normal.  Oxygenating well on room air  Cardiovascular system: S1 & S2 heard, RRR. No JVD, murmurs, rubs, gallops or clicks. No pedal edema. Gastrointestinal system: Abdomen is nondistended, soft and nontender. No organomegaly or masses felt. Normal bowel sounds heard. Central nervous system: Alert and oriented. No focal neurological deficits. Extremities: Left lower extremity with splint in place, neurovascular intact Skin: No rashes, lesions or ulcers Psychiatry: Judgement and insight appear normal. Mood & affect appropriate.     Data Reviewed: I have personally reviewed following labs and imaging studies  CBC: Recent Labs  Lab 10/12/20 2331 10/13/20 2043 10/14/20 1036 10/15/20 0825  WBC 10.8* 8.1 14.3* 9.8  HGB 13.7 10.8* 10.8* 11.3*  HCT 43.4 33.5* 34.3* 35.7*  MCV 96.2 93.8 96.6 93.9  PLT 548* 444* 383 477*   Basic Metabolic Panel: Recent Labs  Lab 10/12/20 2331 10/13/20 2043 10/15/20 0825  NA 142  --  140  K 4.2  --  4.2  CL 104  --  107  CO2 25  --  25  GLUCOSE 117*  --  89  BUN 16  --  10  CREATININE 0.68 0.78 0.63  CALCIUM 9.5  --  8.9   GFR: Estimated Creatinine Clearance: 50 mL/min (by C-G formula based on SCr of 0.63 mg/dL). Liver Function Tests: No results for input(s): AST, ALT, ALKPHOS, BILITOT, PROT, ALBUMIN in the last 168 hours. No results for input(s): LIPASE, AMYLASE in the last 168 hours. No results for input(s): AMMONIA in the last 168 hours. Coagulation Profile: Recent Labs  Lab 10/12/20 2331  INR 1.0   Cardiac Enzymes: No results for input(s): CKTOTAL, CKMB, CKMBINDEX, TROPONINI in the last 168 hours. BNP (last 3 results) No results for input(s): PROBNP in the last 8760 hours. HbA1C: No results for input(s): HGBA1C  in the last 72 hours. CBG: No results for input(s): GLUCAP in the last 168 hours. Lipid Profile: No results for input(s): CHOL, HDL, LDLCALC, TRIG, CHOLHDL, LDLDIRECT in the last 72 hours. Thyroid Function Tests: No results for input(s): TSH, T4TOTAL, FREET4, T3FREE, THYROIDAB in the last 72 hours. Anemia Panel: No results for input(s): VITAMINB12, FOLATE, FERRITIN, TIBC, IRON, RETICCTPCT in the last 72 hours. Sepsis Labs: No results for input(s): PROCALCITON, LATICACIDVEN in the last 168 hours.  Recent Results (from the past 240 hour(s))  Respiratory Panel by RT PCR (Flu A&B, Covid) - Nasopharyngeal Swab     Status: None   Collection Time: 10/13/20  1:55 AM   Specimen: Nasopharyngeal Swab  Result Value Ref Range Status   SARS Coronavirus 2 by RT PCR NEGATIVE NEGATIVE Final    Comment: (NOTE) SARS-CoV-2 target nucleic acids are NOT DETECTED.  The SARS-CoV-2 RNA is generally detectable in upper respiratoy specimens during the acute phase of infection. The lowest concentration of SARS-CoV-2 viral copies  this assay can detect is 131 copies/mL. A negative result does not preclude SARS-Cov-2 infection and should not be used as the sole basis for treatment or other patient management decisions. A negative result may occur with  improper specimen collection/handling, submission of specimen other than nasopharyngeal swab, presence of viral mutation(s) within the areas targeted by this assay, and inadequate number of viral copies (<131 copies/mL). A negative result must be combined with clinical observations, patient history, and epidemiological information. The expected result is Negative.  Fact Sheet for Patients:  https://www.moore.com/  Fact Sheet for Healthcare Providers:  https://www.young.biz/  This test is no t yet approved or cleared by the [place] FDA and  has been authorized for detection and/or diagnosis of SARS-CoV-2 by FDA  under an Emergency Use Authorization (EUA). This EUA will remain  in effect (meaning this test can be used) for the duration of the COVID-19 declaration under Section 564(b)(1) of the Act, 21 U.S.C. section 360bbb-3(b)(1), unless the authorization is terminated or revoked sooner.     Influenza A by PCR NEGATIVE NEGATIVE Final   Influenza B by PCR NEGATIVE NEGATIVE Final    Comment: (NOTE) The Xpert Xpress SARS-CoV-2/FLU/RSV assay is intended as an aid in  the diagnosis of influenza from Nasopharyngeal swab specimens and  should not be used as a sole basis for treatment. Nasal washings and  aspirates are unacceptable for Xpert Xpress SARS-CoV-2/FLU/RSV  testing.  Fact Sheet for Patients: https://www.moore.com/  Fact Sheet for Healthcare Providers: https://www.young.biz/  This test is not yet approved or cleared by the [place] FDA and  has been authorized for detection and/or diagnosis of SARS-CoV-2 by  FDA under an Emergency Use Authorization (EUA). This EUA will remain  in effect (meaning this test can be used) for the duration of the  Covid-19 declaration under Section 564(b)(1) of the Act, 21  U.S.C. section 360bbb-3(b)(1), unless the authorization is  terminated or revoked. Performed at [facility], [address], [place], [place] [address]   MRSA PCR Screening     Status: None   Collection Time: 10/13/20  3:24 AM   Specimen: Nasal Mucosa; Nasopharyngeal  Result Value Ref Range Status   MRSA by PCR NEGATIVE NEGATIVE Final    Comment:        The GeneXpert MRSA Assay (FDA approved for NASAL specimens only), is one component of a comprehensive MRSA colonization surveillance program. It is not intended to diagnose MRSA infection nor to guide or monitor treatment for MRSA infections. Performed at [facility], [address] [place], [place] [address]          Radiology Studies: DG Tibia/Fibula  Left  Result Date: 10/13/2020 CLINICAL DATA:  Open reduction internal fixation of left tibial fracture. EXAM: DG C-ARM 1-60 MIN; LEFT TIBIA AND FIBULA - 2 VIEW FLUOROSCOPY TIME:  Fluoroscopy Time:  1 minutes and 5 seconds Number of Acquired Spot Images: 0 COMPARISON:  July 19, 2006 FINDINGS: Intraoperative fluoroscopic images from sideplate and screw fixation of comminuted impacted distal left tibial fracture demonstrate improved alignment and no evidence of immediate complications. There is an associated mildly comminuted distal fibular fracture. IMPRESSION: 1. Intraoperative fluoroscopic images from sideplate and screw fixation of comminuted impacted distal left tibial fracture. 2. Mildly comminuted distal fibular fracture. Electronically Signed   By: Ted Mcalpine M.D.   On: 10/13/2020 12:49   DG Tibia/Fibula Left Port  Result Date: 10/13/2020 CLINICAL DATA:  Distal tibial fracture status post fixation EXAM: PORTABLE  LEFT TIBIA AND FIBULA - 2 VIEW COMPARISON:  Tibia and fibula intraoperative radiographs dated 10/13/2020. FINDINGS: An overlying splint obscures bony detail. A fixated comminuted distal tibial fracture with fixation plate and screws is in near anatomic alignment. A comminuted distal fibula fracture is in near anatomic alignment. There is no ankle joint dislocation. There is diffuse bony demineralization. IMPRESSION: Near anatomic alignment of the fixated distal tibial fracture and the comminuted distal fibula fracture. Electronically Signed   By: Romona Curls M.D.   On: 10/13/2020 16:11   DG C-Arm 1-60 Min  Result Date: 10/13/2020 CLINICAL DATA:  Open reduction internal fixation of left tibial fracture. EXAM: DG C-ARM 1-60 MIN; LEFT TIBIA AND FIBULA - 2 VIEW FLUOROSCOPY TIME:  Fluoroscopy Time:  1 minutes and 5 seconds Number of Acquired Spot Images: 0 COMPARISON:  July 19, 2006 FINDINGS: Intraoperative fluoroscopic images from sideplate and screw fixation of comminuted impacted  distal left tibial fracture demonstrate improved alignment and no evidence of immediate complications. There is an associated mildly comminuted distal fibular fracture. IMPRESSION: 1. Intraoperative fluoroscopic images from sideplate and screw fixation of comminuted impacted distal left tibial fracture. 2. Mildly comminuted distal fibular fracture. Electronically Signed   By: Ted Mcalpine M.D.   On: 10/13/2020 12:49        Scheduled Meds: . docusate sodium  100 mg Oral BID  . enoxaparin (LOVENOX) injection  40 mg Subcutaneous Q24H  . methenamine  1 g Oral QODAY  . mupirocin ointment  1 application Nasal BID   Continuous Infusions: . 0.9 % NaCl with KCl 20 mEq / L    . methocarbamol (ROBAXIN) IV       LOS: 2 days    Time spent: 35 minutes spent on chart review, discussion with nursing staff, consultants, updating family and interview/physical exam; more than 50% of that time was spent in counseling and/or coordination of care.    Alvira Philips [place], DO [facility] Available via Epic secure chat 7am-7pm After these hours, please refer to coverage provider listed on [email or web address] 10/15/2020, 10:22 AM

## 2020-10-15 NOTE — Plan of Care (Signed)

## 2020-10-15 NOTE — Progress Notes (Signed)
Orthopedic Tech Progress Note Patient Details:  [name] January 21, 1946 [id number]  Ortho Devices Type of Ortho Device: Shoulder abduction pillow Ortho Device/Splint Location: Right Upper Extremity Ortho Device/Splint Interventions: Ordered, Delivered to OR       Clema Skousen P Harle Stanford 10/15/2020, 6:07 PM

## 2020-10-15 NOTE — Anesthesia Preprocedure Evaluation (Signed)
Anesthesia Evaluation  Patient identified by MRN, date of birth, ID band Patient awake    Reviewed: Allergy & Precautions, NPO status , Patient's Chart, lab work & pertinent test results  Airway Mallampati: II  TM Distance: >3 FB Neck ROM: Full    Dental no notable dental hx.    Pulmonary neg pulmonary ROS,    Pulmonary exam normal breath sounds clear to auscultation       Cardiovascular negative cardio ROS Normal cardiovascular exam Rhythm:Regular Rate:Normal     Neuro/Psych MS negative psych ROS   GI/Hepatic negative GI ROS, Neg liver ROS,   Endo/Other  negative endocrine ROS  Renal/GU negative Renal ROS  negative genitourinary   Musculoskeletal negative musculoskeletal ROS (+)   Abdominal   Peds negative pediatric ROS (+)  Hematology negative hematology ROS (+)   Anesthesia Other Findings   Reproductive/Obstetrics negative OB ROS                             Anesthesia Physical Anesthesia Plan  ASA: III  Anesthesia Plan: General   Post-op Pain Management:  Regional for Post-op pain   Induction: Intravenous  PONV Risk Score and Plan: 3 and Ondansetron, Dexamethasone and Treatment may vary due to age or medical condition  Airway Management Planned: Oral ETT  Additional Equipment:   Intra-op Plan:   Post-operative Plan: Extubation in OR  Informed Consent: I have reviewed the patients History and Physical, chart, labs and discussed the procedure including the risks, benefits and alternatives for the proposed anesthesia with the patient or authorized representative who has indicated his/her understanding and acceptance.     Dental advisory given  Plan Discussed with: CRNA and Surgeon  Anesthesia Plan Comments:         Anesthesia Quick Evaluation

## 2020-10-15 NOTE — Progress Notes (Addendum)
OT Cancellation Note  Patient Details Name: [name] MRN: [id number] DOB: [birth year]   Cancelled Treatment:    Reason Eval/Treat Not Completed: Other (comment) Pt undergoing shoulder surgery today. Will follow-up for OT session post-op to assess functional changes.   Lorre Munroe 10/15/2020, 1:33 PM

## 2020-10-15 NOTE — Interval H&P Note (Signed)
All questions answered

## 2020-10-15 NOTE — Anesthesia Procedure Notes (Signed)
Anesthesia Procedure Image    

## 2020-10-15 NOTE — Plan of Care (Signed)

## 2020-10-15 NOTE — Progress Notes (Signed)
Ortho Progress Note  S: Doing well. No complaints with leg. Scheduled for shoulder surgery with Dr.Varkey later today. Patient has requested she go to [facility] or [facility] for rehab if available at discharge. Will have TOC talk with patient about options  O: General: Sitting up in bed, NAD Respiratory: No increased work of breathing at rest LLE: Splint in place, c/d/i. Wiggles toes. Neuro at baseline  Imaging: stable postop imaging  Assessment/Plan: 74 yo w/ MS s/p ORIF of left distal tibia 10/13/20  NWB LLE Splint until follow up Lovenox for VTE PT/OT as tolerated, will need SNF.  Appreciate hospitalist assistance    Amrit Cress A. Ladonna Snide [facility] [phone] (office) [email or web address]

## 2020-10-16 ENCOUNTER — Encounter (HOSPITAL_COMMUNITY): Payer: Self-pay | Admitting: Orthopaedic Surgery

## 2020-10-16 DIAGNOSIS — M75121 Complete rotator cuff tear or rupture of right shoulder, not specified as traumatic: Secondary | ICD-10-CM

## 2020-10-16 LAB — BASIC METABOLIC PANEL
Anion gap: 10 (ref 5–15)
BUN: 9 mg/dL (ref 8–23)
CO2: 24 mmol/L (ref 22–32)
Calcium: 9.1 mg/dL (ref 8.9–10.3)
Chloride: 103 mmol/L (ref 98–111)
Creatinine, Ser: 0.57 mg/dL (ref 0.44–1.00)
GFR, Estimated: >60 mL/min (ref >60)
Glucose, Bld: 159 mg/dL — ABNORMAL HIGH (ref 70–99)
Potassium: 4.4 mmol/L (ref 3.5–5.1)
Sodium: 137 mmol/L (ref 135–145)

## 2020-10-16 LAB — CBC
HCT: 36.3 % (ref 36.0–46.0)
Hemoglobin: 11.9 g/dL — ABNORMAL LOW (ref 12.0–15.0)
MCH: 30.7 pg (ref 26.0–34.0)
MCHC: 32.8 g/dL (ref 30.0–36.0)
MCV: 93.6 fL (ref 80.0–100.0)
Platelets: 523 10*3/uL — ABNORMAL HIGH (ref 150–400)
RBC: 3.88 MIL/uL (ref 3.87–5.11)
RDW: 11.4 % — ABNORMAL LOW (ref 11.5–15.5)
WBC: 11.4 10*3/uL — ABNORMAL HIGH (ref 4.0–10.5)
nRBC: 0 % (ref 0.0–0.2)

## 2020-10-16 NOTE — TOC Initial Note (Addendum)
Transition of Care [facility]) - Initial/Assessment Note    Patient Details  Name: [name] MRN: [id number] Date of Birth: [birth year]  [facility] CM/SW Contact:    Epifanio Lesches, RN Phone Number: 10/16/2020, 2:12 PM  Clinical Narrative:        Presents with R rotator cuff tear, s/p Right reverse total Shoulder Arthroplasty, 11/1. From home alone. Independent with ADL's PTA, no DME usage.  RNCM received consult for possible SNF placement at time of discharge. RNCM spoke with patient regarding PT recommendation of SNF placement at time of discharge. Patient reported she is currently unable to care for patient at their home given patient's current physical needs and fall riska. Patient expressed she  is agreeable to SNF placement at time of discharge if needed. Patient reports preference for [facility] SNF. RNCM discussed insurance authorization process and provided Medicare SNF ratings list. Patient expressed being hopeful for rehab and to feel better soon. No further questions reported at this time. RNCM to continue to follow and assist with discharge planning needs.  Pt is COVID vaccinated.  TOC TEAM will continue to monitor for needs...Marland KitchenMarland KitchenMarland Kitchen  Referral faxed out to SNFs for bed  offers.....  Expected Discharge Plan: Skilled Nursing Facility Barriers to Discharge: Continued Medical Work up   Patient Goals and CMS Choice Patient states their goals for this hospitalization and ongoing recovery are:: To get better CMS Medicare.gov Compare Post Acute Care list provided to:: Patient    Expected Discharge Plan and Services Expected Discharge Plan: Skilled Nursing Facility   Discharge Planning Services: CM Consult   Living arrangements for the past 2 months: Single Family Home                                      Prior Living Arrangements/Services Living arrangements for the past 2 months: Single Family Home   Patient language and need for interpreter  reviewed:: Yes Do you feel safe going back to the place where you live?: Yes      Need for Family Participation in Patient Care: Yes (Comment) Care giver support system in place?: No (comment)   Criminal Activity/Legal Involvement Pertinent to Current Situation/Hospitalization: No - Comment as needed  Activities of Daily Living Home Assistive Devices/Equipment: Wheelchair ADL Screening (condition at time of admission) Patient's cognitive ability adequate to safely complete daily activities?: Yes Is the patient deaf or have difficulty hearing?: No Does the patient have difficulty seeing, even when wearing glasses/contacts?: No Does the patient have difficulty concentrating, remembering, or making decisions?: No Patient able to express need for assistance with ADLs?: Yes Does the patient have difficulty dressing or bathing?: Yes Independently performs ADLs?: No Communication: Independent Dressing (OT): Needs assistance Is this a change from baseline?: Pre-admission baseline Grooming: Needs assistance Is this a change from baseline?: Pre-admission baseline Feeding: Independent Bathing: Needs assistance Is this a change from baseline?: Pre-admission baseline Toileting: Needs assistance Is this a change from baseline?: Pre-admission baseline In/Out Bed: Needs assistance Is this a change from baseline?: Pre-admission baseline Walks in Home: Needs assistance Is this a change from baseline?: Pre-admission baseline Does the patient have difficulty walking or climbing stairs?: Yes Weakness of Legs: Both Weakness of Arms/Hands: None  Permission Sought/Granted                  Emotional Assessment Appearance:: Appears stated age Attitude/Demeanor/Rapport: Engaged Affect (typically observed): Accepting Orientation: :  Oriented to Self, Oriented to Place, Oriented to  Time, Oriented to Situation Alcohol / Substance Use: Not Applicable Psych Involvement: No (comment)  Admission  diagnosis:  Multiple sclerosis (HCC) [G35] Tibia/fibula fracture [S82.209A, S82.409A] Tibia/fibula fracture, left, closed, initial encounter [O17.510C, S82.402A] Patient Active Problem List   Diagnosis Date Noted  . Tibia/fibula fracture 10/13/2020  . Sprain of left ankle 10/08/2020  . Rotator cuff tear arthropathy of right shoulder 10/08/2020  . Low back pain 04/05/2020  . 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  . Gait disturbance 04/03/2020  . Dysconjugate gaze 04/03/2020  . Vision disturbance 04/03/2020  . Nosebleed 03/12/2020  . Benign paroxysmal positional vertigo due to bilateral vestibular disorder 12/23/2019  . Borderline blood pressure   . Labile blood pressure   . Vertigo   . Hypophosphatemia   . Acute blood loss anemia   . Vertigo as late effect of stroke   . Neurogenic bowel   . Neurogenic bladder   . Multiple sclerosis exacerbation (HCC) 11/27/2019  . Pressure injury of skin 11/27/2019  . Acute pain of left wrist 10/06/2019  . Advance directive discussed with patient 05/17/2018  . Paraplegia, unspecified (HCC) 07/02/2017  . Recurrent UTI 07/02/2017  . Right leg swelling 08/26/2016  . Cervical spinal stenosis 06/12/2016  . 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 Health care maintenance 10/18/2012  . Osteoporosis   . Nephrolithiasis   . Dyslipidemia 05/25/2008  . Multiple sclerosis (HCC) 05/24/2008  . MITRAL VALVE PROLAPSE, HX OF 05/24/2008   PCP:  Karie Schwalbe, MD Pharmacy:   [facility] [address] [place] [address] [place]) [place] [address] Phone: [phone] Fax: [phone]     Social Determinants of Health (SDOH) Interventions    Readmission Risk Interventions No flowsheet data found.

## 2020-10-16 NOTE — Evaluation (Signed)
Physical Therapy Re-Evaluation Patient Details Name: [name] MRN: [id number] DOB: [birth year] Today's Date: 10/16/2020   History of Present Illness  74 year old female with a history of multiple sclerosis with a L distal tibia and fibula fracture, now s/p ORIF, NWB; Uses wheelchair for mobility; She does transfer but uses primarily her upper extremities to her.  She has a rotator cuff tear that has limited her ability to do this and underwent reverse total shoulder arthroplasty on 11/1.Marland Kitchen  She just got over a left ankle sprain.  has a past medical history of Ankle fracture, left (07/2006), Ankle fracture, right (10/30/06), Compression fracture (2011), Disc herniation, MS (multiple sclerosis) (HCC), MVP (mitral valve prolapse) (1979), Nephrolithiasis, Osteoporosis, Rib fractures (1998, 3295,1884), and Toe fracture (1998).  Clinical Impression   Patient is now s/p R reverse TSA in addition to prev L ankle ORIF resulting in functional limitations due to the deficits listed below (see PT Problem List). Now less able to use her RUE for ADLs and transfers, and she is quite anxious of moving wrong and causing damage to her shoulder; Still recommend pt be mobile, up and OOB, and especially getting to her power chair to give her more ability to be more independent with mobility and ADLs (reports her son can bring her power chair); Given her anxiety, likely that the [facility] or dependent lift transfer will be her best, most stable option here acutely; continue to recommend SNF for post-acute rehab;  Patient will benefit from skilled PT to increase their independence and safety with mobility to allow discharge to the venue listed below.       Follow Up Recommendations SNF    Equipment Recommendations  None recommended by PT (pretty well-equipped)    Recommendations for Other Services       Precautions / Restrictions Precautions Precautions: Fall;Shoulder Type of Shoulder Precautions: total  reverse Shoulder Interventions: Don joy ultra sling;Shoulder abduction pillow Precaution Booklet Issued: Yes (comment) Precaution Comments: allowed to use R UE arm for ADLs in front of the body at abdominal level and transfers. In sling at all other times. No shoulder extension Required Braces or Orthoses: Sling Restrictions Weight Bearing Restrictions: Yes LLE Weight Bearing: Non weight bearing Other Position/Activity Restrictions: NWB R UE      Mobility  Bed Mobility Overal bed mobility: Needs Assistance Bed Mobility: Supine to Sit;Sit to Supine     Supine to sit: Max assist;+2 for physical assistance;+2 for safety/equipment;HOB elevated Sit to supine: Max assist;+2 for physical assistance;+2 for safety/equipment   General bed mobility comments: Max A x 2 for bed mobility, cueing to avoid use of R UE, sequencing now with shoulder precautions. Assistance for trunk and B LE, but pt able to pull self to assist with L UE bedrail    Transfers Overall transfer level: Needs assistance Equipment used:  (bed pads) Transfers: Anterior-Posterior Transfer (simulated)       Anterior-Posterior transfers: Total assist;+2 physical assistance   General transfer comment: More difficulty with weight shifting to reciprocally scoot backwards and forwards sitting up in bed  Ambulation/Gait                Stairs            Wheelchair Mobility    Modified Rankin (Stroke Patients Only)       Balance Overall balance assessment: Needs assistance Sitting-balance support: Single extremity supported;Feet supported Sitting balance-Leahy Scale: Poor Sitting balance - Comments: Reliant on at least one UE support to maintain balance. A  few instances of min guard but unable to maintain >15 sec Postural control: Posterior lean                                   Pertinent Vitals/Pain Pain Assessment: 0-10 Pain Score: 8  Faces Pain Scale: Hurts a little bit Pain  Location: R shoulder Pain Descriptors / Indicators: Aching;Discomfort Pain Intervention(s): Monitored during session    Home Living Family/patient expects to be discharged to:: Private residence Living Arrangements: Alone Available Help at Discharge: Personal care attendant;Family;Available 24 hours/day (son stays at night, PCA during the day) Type of Home: House Home Access: Ramped entrance     Home Layout: One level Home Equipment: Wheelchair - power;Wheelchair - manual;Bedside commode Additional Comments: also has slide board    Prior Function Level of Independence: Needs assistance   Gait / Transfers Assistance Needed: assist in/out of w/c  ADL's / Homemaking Assistance Needed: needing assist to get on/off toilet recently  Comments: Normally living alone and functioning independently; as of recently she required assist from PCA and son staying at night.     Hand Dominance   Dominant Hand: Right    Extremity/Trunk Assessment   Upper Extremity Assessment Upper Extremity Assessment: Defer to OT evaluation RUE Deficits / Details: R UE s/p total reverse shoulder arthroplasty. elbow, wrist and hand ROM WFL RUE: Unable to fully assess due to immobilization RUE Coordination: decreased gross motor    Lower Extremity Assessment Lower Extremity Assessment: RLE deficits/detail;LLE deficits/detail RLE Deficits / Details: Overall weak with MS; notable hip and knee muscle voluntary activiation, though uncoordinated RLE Coordination: decreased gross motor LLE Deficits / Details: Did not observe voluntary movement of LLE; lower leg cast     Cervical / Trunk Assessment Cervical / Trunk Assessment: Kyphotic  Communication   Communication: No difficulties  Cognition Arousal/Alertness: Awake/alert Behavior During Therapy: WFL for tasks assessed/performed;Anxious Overall Cognitive Status: Impaired/Different from baseline Area of Impairment: Memory;Problem solving                      Memory: Decreased short-term memory       Problem Solving: Requires verbal cues;Requires tactile cues General Comments: Overall functional and participatory though anxious      General Comments General comments (skin integrity, edema, etc.): began educating on shoulder precautions (plan to provide handout), sling mgmt (pt unable to pinch clasp to release sling without assistance), and ROM allowances. Pt anxious and fearful to do anything with R UE    Exercises Shoulder Exercises Elbow Flexion: AROM;Right;5 reps;Seated Elbow Extension: AROM;Right;5 reps;Seated Wrist Flexion: AROM;Right;5 reps;Seated Wrist Extension: AROM;Right;5 reps;Seated Digit Composite Flexion: AROM;Right;5 reps;Seated Composite Extension: AROM;Right;5 reps;Seated Donning/doffing shirt without moving shoulder: Maximal assistance Method for sponge bathing under operated UE: Moderate assistance Donning/doffing sling/immobilizer: Maximal assistance Correct positioning of sling/immobilizer: Moderate assistance ROM for elbow, wrist and digits of operated UE: Supervision/safety Sling wearing schedule (on at all times/off for ADL's): Supervision/safety Positioning of UE while sleeping: Supervision/safety   Assessment/Plan    PT Assessment Patient needs continued PT services  PT Problem List Decreased strength;Decreased range of motion;Decreased activity tolerance;Decreased balance;Decreased mobility;Decreased coordination;Decreased knowledge of use of DME;Decreased safety awareness;Decreased knowledge of precautions;Pain;Impaired sensation;Impaired tone       PT Treatment Interventions DME instruction;Functional mobility training;Therapeutic activities;Therapeutic exercise;Balance training;Neuromuscular re-education;Cognitive remediation;Patient/family education;Wheelchair mobility training    PT Goals (Current goals can be found in the Care Plan section)  Acute Rehab  PT Goals Patient Stated Goal:  indicated she plans on going to SNF PT Goal Formulation: With patient Time For Goal Achievement: 10/30/20 Potential to Achieve Goals: Fair (will update transfer goal)    Frequency Min 2X/week   Barriers to discharge        Co-evaluation PT/OT/SLP Co-Evaluation/Treatment: Yes Reason for Co-Treatment: Complexity of the patient's impairments (multi-system involvement);For patient/therapist safety PT goals addressed during session: Mobility/safety with mobility OT goals addressed during session: ADL's and self-care;Other (comment) (shoulder precautions)       AM-PAC PT "6 Clicks" Mobility  Outcome Measure Help needed turning from your back to your side while in a flat bed without using bedrails?: A Lot Help needed moving from lying on your back to sitting on the side of a flat bed without using bedrails?: A Lot Help needed moving to and from a bed to a chair (including a wheelchair)?: Total Help needed standing up from a chair using your arms (e.g., wheelchair or bedside chair)?: Total Help needed to walk in hospital room?: Total Help needed climbing 3-5 steps with a railing? : Total 6 Click Score: 8    End of Session Equipment Utilized During Treatment: Other (comment) (bed pad) Activity Tolerance: Patient tolerated treatment well Patient left: in bed;with call bell/phone within reach;with bed alarm set Nurse Communication: Mobility status;Need for lift equipment PT Visit Diagnosis: Other abnormalities of gait and mobility (R26.89);Pain Pain - part of body: Shoulder;Leg (R shoulder, L lower leg)    Time: 3151-7616 PT Time Calculation (min) (ACUTE ONLY): 46 min   Charges:   PT Evaluation $PT Re-evaluation: 1 Re-eval          Van Clines, PT  Acute Rehabilitation Services Pager [phone] Office [phone]   Levi Aland 10/16/2020, 4:51 PM

## 2020-10-16 NOTE — Anesthesia Postprocedure Evaluation (Signed)
Anesthesia Post Note  Patient: [name]  Procedure(s) Performed: REVERSE SHOULDER ARTHROPLASTY (Right Shoulder)     Patient location during evaluation: PACU Anesthesia Type: General Level of consciousness: awake and alert Pain management: pain level controlled Vital Signs Assessment: post-procedure vital signs reviewed and stable Respiratory status: spontaneous breathing, nonlabored ventilation, respiratory function stable and patient connected to nasal cannula oxygen Cardiovascular status: blood pressure returned to baseline and stable Postop Assessment: no apparent nausea or vomiting Anesthetic complications: no   No complications documented.  Last Vitals:  Vitals:   10/16/20 0451 10/16/20 0800  BP: 113/64 118/77  Pulse: 86 85  Resp: 18 18  Temp: 36.9 C 36.7 C  SpO2: 95% 95%    Last Pain:  Vitals:   10/16/20 0815  TempSrc:   PainSc: 0-No pain                 Addeline Calarco S

## 2020-10-16 NOTE — Progress Notes (Signed)
PROGRESS NOTE    [name]  TGG:[id number] DOB: 25-Feb-1946 DOA: 10/12/2020 PCP: Karie Schwalbe, MD    Brief Narrative:  [name] is a 74 year old female with past medical history notable for multiple sclerosis in which she is wheelchair-bound at baseline, MVP who was sent to the emergency department from her orthopedist office for need of operative management for a left tibia/fibular fracture.  Patient initially injured her leg after being caught in the wheelchair and was immobilized with a cast and her orthopedist office.  Patient continues with left lower extremity pain, otherwise no other complaints.  In the ED, WBC count 10.8, hemoglobin 13.7, platelet 548, sodium 142, potassium 4.2, chloride 104, bicarbonate 25, BUN 16, creatinine 0.6, glucose 117.  INR 1.0.  SARS-CoV-2/Covid-19 PCR negative.  Hospitalist service consulted for admission.   Assessment & Plan:   Principal Problem:   Tibia/fibula fracture Active Problems:   Multiple sclerosis (HCC)   Paraplegia, unspecified (HCC)   Neurogenic bladder   Left tibia/fibula fracture Patient presenting to the ED from her orthopedist's office, Dr. Eulah Pont for need of operative management of her acute left tibia/fibula fracture.  Patient is wheelchair-bound at baseline due to her underlying multiple sclerosis and injured her left leg after being caught in the wheelchair causing the leg to twist and fracture.  Splinted in orthopedics office prior to ED arrival.  Patient underwent ORIF of left tibia fracture by Dr. Jena Gauss on 10/13/2020. --NWB LLE --Norco 7.5-325 mg 1-2 tablets every 4 hours as needed --Morphine 0.5-1 mg every 2 hours as needed severe pain --Robaxin 500 mg p.o. every 6 hours as needed muscle spasm --Lovenox for postoperative DVT prophylaxis --TOC consult for SNF placement; patient prefers [facility] or [facility]  Right shoulder rotator cuff tear Underwent right reverse total shoulder arthroplasty, 10/15/2020  by Dr. Everardo Pacific.  --Right upper extremity okay to use arm for ADLs in front of body at abdominal level and transfers, sling for all other activities including sleep  Multiple sclerosis Paraplegia Neurogenic bladder Follows with outpatient neurology, Dr. Epimenio Foot.  Recently seen on 07/03/2020, no new lesions on MRI and not started on disease modifying therapy.  Patient reports not on oxybutynin as it is listed on her home medications. --Monitor urine output   DVT prophylaxis: Lovenox Code Status: Full code Family Communication: No family present at bedside this morning  Disposition Plan:  Status is: Inpatient  Remains inpatient appropriate because:Ongoing active pain requiring inpatient pain management, Unsafe d/c plan, IV treatments appropriate due to intensity of illness or inability to take PO and Inpatient level of care appropriate due to severity of illness   Dispo: The patient is from: Home              Anticipated d/c is to: SNF              Anticipated d/c date is: 1 day              Patient currently is not medically stable to d/c.   Consultants:   Orthopedics, Dr. Jena Gauss, Dr. Everardo Pacific  Procedures:   ORIF left tibia fracture, Dr. Jena Gauss 10/13/2020  Right reverse total shoulder arthroplasty, Dr. Everardo Pacific 10/15/2020  Antimicrobials:   Perioperative cefazolin   Subjective: Patient seen and examined bedside, resting comfortably. Having some difficulty utilizing her left arm to eat breakfast this morning.  Right upper extremity in sling.  Pain controlled.  No other complaints or concerns at this time. Denies headache, no fever/chills/night sweats, no nausea/vomiting/diarrhea, no chest  pain, palpitations, no shortness of breath, no abdominal pain.  No acute events overnight per nursing staff.  Objective: Vitals:   10/15/20 2212 10/15/20 2339 10/16/20 0451 10/16/20 0800  BP: 112/74 119/67 113/64 118/77  Pulse: 74 82 86 85  Resp: Temp: 97.8 F (36.6 C) 97.7 F  (36.5 C) 98.4 F (36.9 C) 98.1 F (36.7 C)  TempSrc: Oral Oral Oral Oral  SpO2: 96% 96% 95% 95%  Weight:      Height:        Intake/Output Summary (Last 24 hours) at 10/16/2020 1204 Last data filed at 10/16/2020 0900 Gross per 24 hour  Intake 1068.83 ml  Output 1080 ml  Net -11.17 ml   Filed Weights   10/12/20 1759  Weight: 51.3 kg    Examination:  General exam: Appears calm and comfortable  Respiratory system: Clear to auscultation. Respiratory effort normal.  Oxygenating well on room air  Cardiovascular system: S1 & S2 heard, RRR. No JVD, murmurs, rubs, gallops or clicks. No pedal edema. Gastrointestinal system: Abdomen is nondistended, soft and nontender. No organomegaly or masses felt. Normal bowel sounds heard. Central nervous system: Alert and oriented. No focal neurological deficits. Extremities: Left lower extremity with splint in place, right shoulder with sling in place, neurovascular intact Skin: No rashes, lesions or ulcers Psychiatry: Judgement and insight appear normal. Mood & affect appropriate.     Data Reviewed: I have personally reviewed following labs and imaging studies  CBC: Recent Labs  Lab 10/12/20 2331 10/13/20 2043 10/14/20 1036 10/15/20 0825 10/16/20 0212  WBC 10.8* 8.1 14.3* 9.8 11.4*  HGB 13.7 10.8* 10.8* 11.3* 11.9*  HCT 43.4 33.5* 34.3* 35.7* 36.3  MCV 96.2 93.8 96.6 93.9 93.6  PLT 548* 444* 383 477* 523*   Basic Metabolic Panel: Recent Labs  Lab 10/12/20 2331 10/13/20 2043 10/15/20 0825 10/16/20 0212  NA 142  --  140 137  K 4.2  --  4.2 4.4  CL 104  --  107 103  CO2 25  --  25 24  GLUCOSE 117*  --  89 159*  BUN 16  --  10 9  CREATININE 0.68 0.78 0.63 0.57  CALCIUM 9.5  --  8.9 9.1   GFR: Estimated Creatinine Clearance: 50 mL/min (by C-G formula based on SCr of 0.57 mg/dL). Liver Function Tests: No results for input(s): AST, ALT, ALKPHOS, BILITOT, PROT, ALBUMIN in the last 168 hours. No results for input(s): LIPASE,  AMYLASE in the last 168 hours. No results for input(s): AMMONIA in the last 168 hours. Coagulation Profile: Recent Labs  Lab 10/12/20 2331  INR 1.0   Cardiac Enzymes: No results for input(s): CKTOTAL, CKMB, CKMBINDEX, TROPONINI in the last 168 hours. BNP (last 3 results) No results for input(s): PROBNP in the last 8760 hours. HbA1C: No results for input(s): HGBA1C in the last 72 hours. CBG: No results for input(s): GLUCAP in the last 168 hours. Lipid Profile: No results for input(s): CHOL, HDL, LDLCALC, TRIG, CHOLHDL, LDLDIRECT in the last 72 hours. Thyroid Function Tests: No results for input(s): TSH, T4TOTAL, FREET4, T3FREE, THYROIDAB in the last 72 hours. Anemia Panel: No results for input(s): VITAMINB12, FOLATE, FERRITIN, TIBC, IRON, RETICCTPCT in the last 72 hours. Sepsis Labs: No results for input(s): PROCALCITON, LATICACIDVEN in the last 168 hours.  Recent Results (from the past 240 hour(s))  Respiratory Panel by RT PCR (Flu A&B, Covid) - Nasopharyngeal Swab     Status: None   Collection Time: 10/13/20  1:55 AM   Specimen: Nasopharyngeal Swab  Result Value Ref Range Status   SARS Coronavirus 2 by RT PCR NEGATIVE NEGATIVE Final    Comment: (NOTE) SARS-CoV-2 target nucleic acids are NOT DETECTED.  The SARS-CoV-2 RNA is generally detectable in upper respiratoy specimens during the acute phase of infection. The lowest concentration of SARS-CoV-2 viral copies this assay can detect is 131 copies/mL. A negative result does not preclude SARS-Cov-2 infection and should not be used as the sole basis for treatment or other patient management decisions. A negative result may occur with  improper specimen collection/handling, submission of specimen other than nasopharyngeal swab, presence of viral mutation(s) within the areas targeted by this assay, and inadequate number of viral copies (<131 copies/mL). A negative result must be combined with clinical observations, patient  history, and epidemiological information. The expected result is Negative.  Fact Sheet for Patients:  https://www.moore.com/  Fact Sheet for Healthcare Providers:  https://www.young.biz/  This test is no t yet approved or cleared by the [place] FDA and  has been authorized for detection and/or diagnosis of SARS-CoV-2 by FDA under an Emergency Use Authorization (EUA). This EUA will remain  in effect (meaning this test can be used) for the duration of the COVID-19 declaration under Section 564(b)(1) of the Act, 21 U.S.C. section 360bbb-3(b)(1), unless the authorization is terminated or revoked sooner.     Influenza A by PCR NEGATIVE NEGATIVE Final   Influenza B by PCR NEGATIVE NEGATIVE Final    Comment: (NOTE) The Xpert Xpress SARS-CoV-2/FLU/RSV assay is intended as an aid in  the diagnosis of influenza from Nasopharyngeal swab specimens and  should not be used as a sole basis for treatment. Nasal washings and  aspirates are unacceptable for Xpert Xpress SARS-CoV-2/FLU/RSV  testing.  Fact Sheet for Patients: https://www.moore.com/  Fact Sheet for Healthcare Providers: https://www.young.biz/  This test is not yet approved or cleared by the [place] FDA and  has been authorized for detection and/or diagnosis of SARS-CoV-2 by  FDA under an Emergency Use Authorization (EUA). This EUA will remain  in effect (meaning this test can be used) for the duration of the  Covid-19 declaration under Section 564(b)(1) of the Act, 21  U.S.C. section 360bbb-3(b)(1), unless the authorization is  terminated or revoked. Performed at [facility], [address], [place], [place] [address]   MRSA PCR Screening     Status: None   Collection Time: 10/13/20  3:24 AM   Specimen: Nasal Mucosa; Nasopharyngeal  Result Value Ref Range Status   MRSA by PCR NEGATIVE NEGATIVE Final    Comment:        The  GeneXpert MRSA Assay (FDA approved for NASAL specimens only), is one component of a comprehensive MRSA colonization surveillance program. It is not intended to diagnose MRSA infection nor to guide or monitor treatment for MRSA infections. Performed at [facility], [address] [place], [place] [address]          Radiology Studies: DG Shoulder Right Port  Result Date: 10/15/2020 CLINICAL DATA:  Postop right shoulder arthroplasty EXAM: PORTABLE RIGHT SHOULDER COMPARISON:  09/06/2020 FINDINGS: Internal rotation, external rotation, transscapular views of the right shoulder demonstrate right shoulder arthroplasty in the expected position without signs of acute complication. No acute fracture. Right chest is clear. IMPRESSION: 1. Unremarkable right shoulder arthroplasty. Electronically Signed   By: Sharlet Salina M.D.   On: 10/15/2020 20:14        Scheduled Meds: . docusate  sodium  100 mg Oral BID  . enoxaparin (LOVENOX) injection  40 mg Subcutaneous Q24H  . methenamine  1 g Oral QODAY  . mupirocin ointment  1 application Nasal BID   Continuous Infusions: . 0.9 % NaCl with KCl 20 mEq / L    . methocarbamol (ROBAXIN) IV       LOS: 3 days    Time spent: 34 minutes spent on chart review, discussion with nursing staff, consultants, updating family and interview/physical exam; more than 50% of that time was spent in counseling and/or coordination of care.    Alvira Philips [place], DO [facility] Available via Epic secure chat 7am-7pm After these hours, please refer to coverage provider listed on [email or web address] 10/16/2020, 12:04 PM

## 2020-10-16 NOTE — NC FL2 (Signed)
[place] MEDICAID FL2 LEVEL OF CARE SCREENING TOOL     IDENTIFICATION  Patient Name: [name] Birthdate: 1946-09-04 Sex: female Admission Date (Current Location): 10/12/2020  [facility] and [place] Number:  Producer, television/film/video and Address:  [place], [address], [place], [place] [address]      Provider Number: [id number]  Attending Physician Name and Address:  [place], Alvira Philips, DO  Relative Name and Phone Number:       Current Level of Care: Hospital Recommended Level of Care: Skilled Nursing Facility Prior Approval Number:    Date Approved/Denied:   PASRR Number: [id number]  Discharge Plan: SNF    Current Diagnoses: Patient Active Problem List   Diagnosis Date Noted  . Tibia/fibula fracture 10/13/2020  . Sprain of left ankle 10/08/2020  . Rotator cuff tear arthropathy of right shoulder 10/08/2020  . Low back pain 04/05/2020  . 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  . Gait disturbance 04/03/2020  . Dysconjugate gaze 04/03/2020  . Vision disturbance 04/03/2020  . Nosebleed 03/12/2020  . Benign paroxysmal positional vertigo due to bilateral vestibular disorder 12/23/2019  . Borderline blood pressure   . Labile blood pressure   . Vertigo   . Hypophosphatemia   . Acute blood loss anemia   . Vertigo as late effect of stroke   . Neurogenic bowel   . Neurogenic bladder   . Multiple sclerosis exacerbation (HCC) 11/27/2019  . Pressure injury of skin 11/27/2019  . Acute pain of left wrist 10/06/2019  . Advance directive discussed with patient 05/17/2018  . Paraplegia, unspecified (HCC) 07/02/2017  . Recurrent UTI 07/02/2017  . Right leg swelling 08/26/2016  . Cervical spinal stenosis 06/12/2016  . 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 Health care maintenance 10/18/2012  . Osteoporosis   . Nephrolithiasis   . Dyslipidemia 05/25/2008  . Multiple sclerosis (HCC) 05/24/2008  . MITRAL VALVE PROLAPSE, HX OF 05/24/2008     Orientation RESPIRATION BLADDER Height & Weight     Self, Time, Situation, Place  Normal Continent Weight: 51.3 kg Height:  5\' 3"  (160 cm)  BEHAVIORAL SYMPTOMS/MOOD NEUROLOGICAL BOWEL NUTRITION STATUS      Continent Diet (refe to d/c summary)  AMBULATORY STATUS COMMUNICATION OF NEEDS Skin   Extensive Assist Verbally Surgical wounds (Right reverse total Shoulder Arthroplasty,11/1)                       Personal Care Assistance Level of Assistance  Bathing, Dressing, Feeding Bathing Assistance: Maximum assistance Feeding assistance: Limited assistance Dressing Assistance: Maximum assistance     Functional Limitations Info  Sight, Hearing, Speech Sight Info: Adequate Hearing Info: Adequate Speech Info: Adequate    SPECIAL CARE FACTORS FREQUENCY  PT (By licensed PT), OT (By licensed OT)     PT Frequency: 5x/week, evaluate and treat OT Frequency: 5x/week, evaluate and treat            Contractures Contractures Info: Not present    Additional Factors Info  Allergies, Code Status Code Status Info: Full code Allergies Info: Lidocaine, procaine           Current Medications (10/16/2020):  This is the current hospital active medication list Current Facility-Administered Medications  Medication Dose Route Frequency Provider Last Rate Last Admin  . 0.9 % NaCl with KCl 20 mEq/ L  infusion   Intravenous PRN 13/01/2020, PA-C      . acetaminophen (TYLENOL) tablet 325-650 mg  325-650 mg  Oral Q6H PRN Despina Hidden, PA-C      . docusate sodium (COLACE) capsule 100 mg  100 mg Oral BID Ulyses Southward A, PA-C   100 mg at 10/16/20 1018  . enoxaparin (LOVENOX) injection 40 mg  40 mg Subcutaneous Q24H Ulyses Southward A, PA-C   40 mg at 10/16/20 0848  . HYDROcodone-acetaminophen (NORCO) 7.5-325 MG per tablet 1-2 tablet  1-2 tablet Oral Q4H PRN Despina Hidden, PA-C      . HYDROcodone-acetaminophen (NORCO/VICODIN) 5-325 MG per tablet 1-2 tablet  1-2 tablet Oral Q4H PRN  Despina Hidden, PA-C      . menthol-cetylpyridinium (CEPACOL) lozenge 3 mg  1 lozenge Oral PRN [place], Eric J, DO   3 mg at 10/14/20 1314  . methenamine (MANDELAMINE) tablet 1 g  1 g Oral QODAY Ulyses Southward A, PA-C   1 g at 10/13/20 1418  . methocarbamol (ROBAXIN) tablet 500 mg  500 mg Oral Q6H PRN Ulyses Southward A, PA-C       Or  . methocarbamol (ROBAXIN) 500 mg in dextrose 5 % 50 mL IVPB  500 mg Intravenous Q6H PRN Despina Hidden, PA-C      . metoCLOPramide (REGLAN) tablet 5-10 mg  5-10 mg Oral Q8H PRN Ulyses Southward A, PA-C       Or  . metoCLOPramide (REGLAN) injection 5-10 mg  5-10 mg Intravenous Q8H PRN Despina Hidden, PA-C      . morphine 2 MG/ML injection 0.5-1 mg  0.5-1 mg Intravenous Q2H PRN Despina Hidden, PA-C      . mupirocin ointment (BACTROBAN) 2 % 1 application  1 application Nasal BID Despina Hidden, PA-C   1 application at 10/16/20 1018  . ondansetron (ZOFRAN) tablet 4 mg  4 mg Oral Q6H PRN Ulyses Southward A, PA-C       Or  . ondansetron (ZOFRAN) injection 4 mg  4 mg Intravenous Q6H PRN Ulyses Southward A, PA-C      . polyethylene glycol (MIRALAX / GLYCOLAX) packet 17 g  17 g Oral Daily PRN Despina Hidden, PA-C         Discharge Medications: Please see discharge summary for a list of discharge medications.  Relevant Imaging Results:  Relevant Lab Results:   Additional Information SS# [id number]  Epifanio Lesches, RN

## 2020-10-16 NOTE — TOC CAGE-AID Note (Signed)
Transition of Care [facility]) - CAGE-AID Screening   Patient Details  Name: [name] MRN: [id number] Date of Birth: 02/07/1946  Transition of Care [facility]) CM/SW Contact:    Emeterio Reeve, [place] Phone Number: 10/16/2020, 3:15 PM   Clinical Narrative:  CSW met with pt at bedside. CSW introduced self and explained role at the hospital.  Pt denies alcohol use. Pt denies substance use. Pt did not need any resources at this time.    CAGE-AID Screening:    Have You Ever Felt You Ought to Cut Down on Your Drinking or Drug Use?: No Have People Annoyed You By Critizing Your Drinking Or Drug Use?: No Have You Felt Bad Or Guilty About Your Drinking Or Drug Use?: No Have You Ever Had a Drink or Used Drugs First Thing In The Morning to Steady Your Nerves or to Get Rid of a Hangover?: No CAGE-AID Score: 0  Substance Abuse Education Offered: Yes    Blima Ledger, [place] Social Worker [phone]

## 2020-10-16 NOTE — Progress Notes (Signed)
   ORTHOPAEDIC PROGRESS NOTE  s/p Procedure(s): RIGHT REVERSE SHOULDER ARTHROPLASTY on 10/15/2020 with Dr. Everardo [place] Open reduction internal fixation of left distal tibia fracture on 10/13/2020 with Dr. Jena Gauss  SUBJECTIVE: She is doing well. Minimal pain right shoulder. Nerve block is still working. No complaints with left leg. No nausea/vomiting. Requests [facility] or [facility] for rehab.   OBJECTIVE: PE: General: NAD Respiratory: no increased work of breathing Cardiac: regular rate Right upper extremity: Dressing CDI and sling well fitting.  Axillary nerve sensation/motor altered in setting of block and unable to be fully tested.  Distal motor and sensory altered in setting of block. Left lower extremity: Splint CDI. Wiggles toes. Neuro deficit at baseline. Warm well perfused foot.   Vitals:   10/15/20 2339 10/16/20 0451  BP: 119/67 113/64  Pulse: 82 86  Resp: 18 18  Temp: 97.7 F (36.5 C) 98.4 F (36.9 C)  SpO2: 96% 95%     ASSESSMENT: [name] is a 74 y.o. female   1. POD #3 ORIF left distal tibia fracture 2. POD #1 Right reverse total shoulder  PLAN: Weightbearing:   1. RUE: Okay to use her arm for ADLs in front of the body at abdominal level and transfers. Sling for all other activities including sleep  2. LLE: NWB Insicional and dressing care:   1. RUE: Reinforce as needed  2. LLE: Keep splint clean and dry.  Orthopedic device(s):   1. RUE: Sling  2. Splint VTE prophylaxis: Lovenox 40mg  qd  Pain control: PRN pain medications, preferring oral medications Follow - up plan: TBD. PT/OT evaluations today. Will likely need SNF. Would like [facility] or [facility]. TOC following.  Contact information:  Dr. , Ramond Marrow PA-C, After hours and holidays please check [email or web address] for group call information for [facility]    Alfonse Alpers, PA-C 10/16/2020

## 2020-10-16 NOTE — Evaluation (Signed)
Occupational Therapy Re-Evaluation Patient Details Name: [name] MRN: [id number] DOB: February 13, 1946 Today's Date: 10/16/2020    History of Present Illness 74 year old female with a history of multiple sclerosis with a L distal tibia and fibula fracture, now s/p ORIF, NWB; Uses wheelchair for mobility; She does transfer but uses primarily her upper extremities to her.  She has a rotator cuff tear that has limited her ability to do this and underwent reverse total shoulder arthroplasty on 11/1.Marland Kitchen  She just got over a left ankle sprain. has a past medical history of Ankle fracture, left (07/2006), Ankle fracture, right (10/30/06), Compression fracture (2011), Disc herniation, MS (multiple sclerosis) (HCC), MVP (mitral valve prolapse) (1979), Nephrolithiasis, Osteoporosis, Rib fractures (1998, 9518,8416), and Toe fracture (1998).   Clinical Impression   Pt seen today for OT re-evaluation s/p reverse total shoulder arthroplasty of dominant R UE. Pt presents now with Max A needed for UB ADLs now along with LB ADLs secondary to impaired dominant R UE use. However,pt remains motivated to return to independence. Pt overall Max A x 2 for bed mobility, cueing for techniques to maintain shoulder precautions. Pt varies from Mod A to min guard for sitting balance EOB. Began education on shoulder precautions, hand/wrist/elbow ROM exercises, and sling mgmt. Pt requires Max A for sling mgmt at this time secondary to decreased pincer grasp when attempting to manage clasp on don joy sling. Pt would benefit from continued education on shoulder precautions during ADLs, as well as compensatory strategies. Continue to recommend SNF for short term rehab to maximize independence prior to return home.    Follow Up Recommendations  SNF    Equipment Recommendations  None recommended by OT    Recommendations for Other Services       Precautions / Restrictions Precautions Precautions: Fall;Shoulder Type of Shoulder  Precautions: total reverse Shoulder Interventions: Don joy ultra sling;Shoulder abduction pillow Precaution Booklet Issued: No Precaution Comments: allowed to use R UE arm for ADLs in front of the body at abdominal level and transfers. In sling at all other times. No shoulder extension Required Braces or Orthoses: Sling Restrictions Weight Bearing Restrictions: Yes LLE Weight Bearing: Non weight bearing Other Position/Activity Restrictions: NWB R UE      Mobility Bed Mobility Overal bed mobility: Needs Assistance Bed Mobility: Supine to Sit;Sit to Supine     Supine to sit: Max assist;+2 for physical assistance;+2 for safety/equipment;HOB elevated Sit to supine: Max assist;+2 for physical assistance;+2 for safety/equipment   General bed mobility comments: Max A x 2 for bed mobility, cueing to avoid use of R UE, sequencing now with shoulder precautions. Assistance for trunk and B LE, but pt able to pull self to assist with L UE bedrail    Transfers                      Balance Overall balance assessment: Needs assistance Sitting-balance support: Single extremity supported;Feet supported Sitting balance-Leahy Scale: Poor Sitting balance - Comments: Reliant on at least one UE support to maintain balance. A few instances of min guard but unable to maintain >15 sec Postural control: Posterior lean                                 ADL either performed or assessed with clinical judgement   ADL Overall ADL's : Needs assistance/impaired Eating/Feeding: Set up;Sitting   Grooming: Minimal assistance;Sitting   Upper Body Bathing: Adhering  to UE precautions;Sitting;Maximal assistance   Lower Body Bathing: Maximal assistance;Sitting/lateral leans;Sit to/from stand   Upper Body Dressing : Maximal assistance;Sitting;Cueing for UE precautions   Lower Body Dressing: Maximal assistance;Sitting/lateral leans;Sit to/from stand       Toileting- Architect  and Hygiene: Total assistance         General ADL Comments: Limited by L LE and now R UE deficits (shoulder precautions), decreased strength and endurance,as well as fear of falling     Vision Baseline Vision/History: No visual deficits Patient Visual Report: No change from baseline Vision Assessment?: No apparent visual deficits     Perception     Praxis      Pertinent Vitals/Pain Pain Assessment: Faces Faces Pain Scale: Hurts a little bit Pain Location: LEs with movement, R shoulder at end of session (still with nerve block) Pain Descriptors / Indicators: Aching;Discomfort Pain Intervention(s): Limited activity within patient's tolerance;Monitored during session;Repositioned;Premedicated before session;Ice applied     Hand Dominance Right   Extremity/Trunk Assessment Upper Extremity Assessment Upper Extremity Assessment: RUE deficits/detail RUE Deficits / Details: R UE s/p total reverse shoulder arthroplasty. elbow, wrist and hand ROM WFL RUE: Unable to fully assess due to immobilization RUE Coordination: decreased gross motor   Lower Extremity Assessment Lower Extremity Assessment: Defer to PT evaluation   Cervical / Trunk Assessment Cervical / Trunk Assessment: Kyphotic   Communication Communication Communication: No difficulties   Cognition Arousal/Alertness: Awake/alert Behavior During Therapy: WFL for tasks assessed/performed;Anxious Overall Cognitive Status: Impaired/Different from baseline Area of Impairment: Memory;Problem solving                     Memory: Decreased short-term memory       Problem Solving: Requires verbal cues;Requires tactile cues General Comments: Overall functional and participatory though anxious   General Comments  began educating on shoulder precautions (plan to provide handout), sling mgmt (pt unable to pinch clasp to release sling without assistance), and ROM allowances. Pt anxious and fearful to do anything with R  UE    Exercises Exercises: Shoulder Shoulder Exercises Elbow Flexion: AROM;Right;5 reps;Seated Elbow Extension: AROM;Right;5 reps;Seated Wrist Flexion: AROM;Right;5 reps;Seated Wrist Extension: AROM;Right;5 reps;Seated Digit Composite Flexion: AROM;Right;5 reps;Seated Composite Extension: AROM;Right;5 reps;Seated   Shoulder Instructions Shoulder Instructions Donning/doffing shirt without moving shoulder: Maximal assistance Method for sponge bathing under operated UE: Moderate assistance Donning/doffing sling/immobilizer: Maximal assistance Correct positioning of sling/immobilizer: Moderate assistance ROM for elbow, wrist and digits of operated UE: Supervision/safety Sling wearing schedule (on at all times/off for ADL's): Supervision/safety Positioning of UE while sleeping: Supervision/safety    Home Living Family/patient expects to be discharged to:: Private residence Living Arrangements: Alone Available Help at Discharge: Personal care attendant;Family;Available 24 hours/day (son stays at night, PCA during the day) Type of Home: House Home Access: Ramped entrance     Home Layout: One level     Bathroom Shower/Tub: Other (comment)   Bathroom Toilet:  (BSC welded to bed)     Home Equipment: Wheelchair - power;Wheelchair - manual;Bedside commode   Additional Comments: also has slide board      Prior Functioning/Environment Level of Independence: Needs assistance  Gait / Transfers Assistance Needed: assist in/out of w/c ADL's / Homemaking Assistance Needed: needing assist to get on/off toilet recently   Comments: Normally living alone and functioning independently; as of recently she required assist from PCA and son staying at night.        OT Problem List: Decreased strength;Decreased knowledge of use of DME or  AE;Decreased knowledge of precautions;Decreased activity tolerance;Decreased cognition;Impaired balance (sitting and/or standing);Pain;Impaired UE functional  use;Decreased range of motion      OT Treatment/Interventions: Self-care/ADL training;Therapeutic exercise;Patient/family education;Balance training;Energy conservation;Therapeutic activities;DME and/or AE instruction    OT Goals(Current goals can be found in the care plan section) Acute Rehab OT Goals Patient Stated Goal: indicated she plans on going to SNF OT Goal Formulation: With patient Time For Goal Achievement: 10/30/20 Potential to Achieve Goals: Good ADL Goals Pt Will Perform Upper Body Bathing: with min assist;sitting Pt Will Perform Upper Body Dressing: with min assist;sitting Pt/caregiver will Perform Home Exercise Program: Both right and left upper extremity;With theraputty;Independently;With written HEP provided  OT Frequency: Min 2X/week   Barriers to D/C:            Co-evaluation PT/OT/SLP Co-Evaluation/Treatment: Yes Reason for Co-Treatment: Complexity of the patient's impairments (multi-system involvement);For patient/therapist safety;To address functional/ADL transfers   OT goals addressed during session: ADL's and self-care;Other (comment) (shoulder precautions)      AM-PAC OT "6 Clicks" Daily Activity     Outcome Measure Help from another person eating meals?: A Little Help from another person taking care of personal grooming?: A Little Help from another person toileting, which includes using toliet, bedpan, or urinal?: Total Help from another person bathing (including washing, rinsing, drying)?: A Lot Help from another person to put on and taking off regular upper body clothing?: A Lot Help from another person to put on and taking off regular lower body clothing?: A Lot 6 Click Score: 13   End of Session Equipment Utilized During Treatment: Other (comment) (sling) Nurse Communication: Mobility status;Need for lift equipment  Activity Tolerance: Patient tolerated treatment well Patient left: in bed;with call bell/phone within reach;with bed alarm  set  OT Visit Diagnosis: Unsteadiness on feet (R26.81);Other abnormalities of gait and mobility (R26.89);Muscle weakness (generalized) (M62.81);Other symptoms and signs involving the nervous system (R29.898);Pain Pain - Right/Left: Left (R shoulder) Pain - part of body: Ankle and joints of foot;Leg (R shoulder)                Time: 0350-0938 OT Time Calculation (min): 46 min Charges:  OT General Charges $OT Visit: 1 Visit OT Evaluation $OT Re-eval: 1 Re-eval OT Treatments $Therapeutic Activity: 8-22 mins  Lorre Munroe, OTR/L  Lorre Munroe 10/16/2020, 1:21 PM

## 2020-10-16 NOTE — Progress Notes (Addendum)
Occupational Therapy Treatment  Pt seen for additional session to provide continued education on shoulder precautions given surgeon's parameters with handout provided. Reinforced hand, wrist and elbow ROM with handout. Provided HEP for hand strengthening and coordination using theraputty to improve ability to independently manage don joy sling clasp with pt verbalizing understanding. Plan to guide pt in UB ADLs while following shoulder precautions within pt's abilities during next session. Continue to recommend SNF for short term rehab.     10/16/20 1500  OT Visit Information  Last OT Received On 10/16/20  Assistance Needed +2  History of Present Illness 74 year old female with a history of multiple sclerosis with a L distal tibia and fibula fracture, now s/p ORIF, NWB; Uses wheelchair for mobility; She does transfer but uses primarily her upper extremities to her.  She has a rotator cuff tear that has limited her ability to do this and underwent reverse total shoulder arthroplasty on 11/1.Marland Kitchen  She just got over a left ankle sprain. has a past medical history of Ankle fracture, left (07/2006), Ankle fracture, right (10/30/06), Compression fracture (2011), Disc herniation, MS (multiple sclerosis) (HCC), MVP (mitral valve prolapse) (1979), Nephrolithiasis, Osteoporosis, Rib fractures (1998, 3557,3220), and Toe fracture (1998).  Precautions  Precautions Fall;Shoulder  Type of Shoulder Precautions total reverse  Shoulder Interventions Don joy ultra sling;Shoulder abduction pillow  Precaution Booklet Issued Yes (comment)  Precaution Comments allowed to use R UE arm for ADLs in front of the body at abdominal level and transfers. In sling at all other times. No shoulder extension  Required Braces or Orthoses Sling  Pain Assessment  Pain Assessment 0-10  Pain Score 8  Pain Location R shoulder  Pain Descriptors / Indicators Aching;Discomfort  Pain Intervention(s) Premedicated before session;Monitored  during session  Cognition  Arousal/Alertness Awake/alert  Behavior During Therapy WFL for tasks assessed/performed;Anxious  Overall Cognitive Status Impaired/Different from baseline  Area of Impairment Memory;Problem solving  Memory Decreased short-term memory  Problem Solving Requires verbal cues;Requires tactile cues  Upper Extremity Assessment  Upper Extremity Assessment RUE deficits/detail  RUE Deficits / Details R UE s/p total reverse shoulder arthroplasty. elbow, wrist and hand ROM WFL  RUE Unable to fully assess due to immobilization  RUE Coordination decreased gross motor  Lower Extremity Assessment  Lower Extremity Assessment Defer to PT evaluation  Restrictions  Weight Bearing Restrictions Yes  LLE Weight Bearing NWB  Other Position/Activity Restrictions NWB R UE  Vision- Assessment  Vision Assessment? No apparent visual deficits  OT - End of Session  Equipment Utilized During Treatment Other (comment) (sling)  Activity Tolerance Patient tolerated treatment well  Patient left in bed;with call bell/phone within reach;with bed alarm set;with nursing/sitter in room  Nurse Communication Patient requests pain meds  OT Assessment/Plan  OT Plan Discharge plan remains appropriate  OT Visit Diagnosis Unsteadiness on feet (R26.81);Other abnormalities of gait and mobility (R26.89);Muscle weakness (generalized) (M62.81);Other symptoms and signs involving the nervous system (R29.898);Pain  Pain - Right/Left Right  Pain - part of body Shoulder  OT Frequency (ACUTE ONLY) Min 2X/week  Follow Up Recommendations SNF  OT Equipment None recommended by OT  AM-PAC OT "6 Clicks" Daily Activity Outcome Measure (Version 2)  Help from another person eating meals? 3  Help from another person taking care of personal grooming? 3  Help from another person toileting, which includes using toliet, bedpan, or urinal? 1  Help from another person bathing (including washing, rinsing, drying)? 2  Help  from another person to put on and taking  off regular upper body clothing? 2  Help from another person to put on and taking off regular lower body clothing? 2  6 Click Score 13  OT Goal Progression  Progress towards OT goals Progressing toward goals  Acute Rehab OT Goals  Patient Stated Goal indicated she plans on going to SNF  OT Goal Formulation With patient  Time For Goal Achievement 10/30/20  Potential to Achieve Goals Good  ADL Goals  Pt Will Perform Lower Body Bathing with min assist;sitting/lateral leans;sit to/from stand;with adaptive equipment  Pt Will Perform Lower Body Dressing with min assist;sitting/lateral leans;sit to/from stand;with adaptive equipment  Pt Will Transfer to Toilet with mod assist;anterior/posterior transfer;bedside commode  Pt Will Perform Upper Body Bathing with min assist;sitting  Pt Will Perform Upper Body Dressing with min assist;sitting  Pt/caregiver will Perform Home Exercise Program Both right and left upper extremity;With theraputty;Independently;With written HEP provided  OT Time Calculation  OT Start Time (ACUTE ONLY) 1458  OT Stop Time (ACUTE ONLY) 1511  OT Time Calculation (min) 13 min  OT General Charges  $OT Visit 1 Visit  OT Treatments  $Self Care/Home Management  8-22 mins

## 2020-10-16 NOTE — Care Management Important Message (Signed)
Important Message  Patient Details  Name: [name] MRN: [id number] Date of Birth: 1946-07-13   Medicare Important Message Given:  Yes - Important Message mailed due to current National Emergency  Verbal consent obtained due to current National Emergency  Relationship to patient: Self Contact Name: Verita Lamb Call Date: 10/16/20  Time: 0958 Phone: [phone] Outcome: Spoke with contact Important Message mailed to: Other (must enter comment) (patient declined additional copy of IM)    Koni Kannan P Neiva Maenza 10/16/2020, 9:59 AM

## 2020-10-17 ENCOUNTER — Telehealth: Payer: Self-pay | Admitting: *Deleted

## 2020-10-17 LAB — CBC
HCT: 32.7 % — ABNORMAL LOW (ref 36.0–46.0)
Hemoglobin: 10.5 g/dL — ABNORMAL LOW (ref 12.0–15.0)
MCH: 29.9 pg (ref 26.0–34.0)
MCHC: 32.1 g/dL (ref 30.0–36.0)
MCV: 93.2 fL (ref 80.0–100.0)
Platelets: 400 10*3/uL (ref 150–400)
RBC: 3.51 MIL/uL — ABNORMAL LOW (ref 3.87–5.11)
RDW: 11.4 % — ABNORMAL LOW (ref 11.5–15.5)
WBC: 13.5 10*3/uL — ABNORMAL HIGH (ref 4.0–10.5)
nRBC: 0 % (ref 0.0–0.2)

## 2020-10-17 LAB — BASIC METABOLIC PANEL
Anion gap: 9 (ref 5–15)
BUN: 11 mg/dL (ref 8–23)
CO2: 24 mmol/L (ref 22–32)
Calcium: 8.5 mg/dL — ABNORMAL LOW (ref 8.9–10.3)
Chloride: 101 mmol/L (ref 98–111)
Creatinine, Ser: 0.6 mg/dL (ref 0.44–1.00)
GFR, Estimated: >60 mL/min (ref >60)
Glucose, Bld: 125 mg/dL — ABNORMAL HIGH (ref 70–99)
Potassium: 4.2 mmol/L (ref 3.5–5.1)
Sodium: 134 mmol/L — ABNORMAL LOW (ref 135–145)

## 2020-10-17 MED ORDER — POLYETHYLENE GLYCOL 3350 17 G PO PACK: 17.0000 g | PACK | Freq: Every day | ORAL | Status: DC | PRN

## 2020-10-17 MED ORDER — DIPHENHYDRAMINE HCL 12.5 MG/5ML PO ELIX: 12.5000 mg | ORAL_SOLUTION | ORAL | Status: DC | PRN

## 2020-10-17 MED ORDER — MAGNESIUM CITRATE PO SOLN: 1.0000 | Freq: Once | ORAL | Status: DC | PRN

## 2020-10-17 MED ORDER — HYDROMORPHONE HCL 1 MG/ML IJ SOLN: 0.5000 mg | INTRAMUSCULAR | Status: DC | PRN

## 2020-10-17 MED ORDER — DOCUSATE SODIUM 100 MG PO CAPS
100.0000 mg | ORAL_CAPSULE | Freq: Two times a day (BID) | ORAL | Status: DC
  Administered 2020-10-17 – 2020-10-19 (×4): 100 mg via ORAL
  Filled 2020-10-17 (×4): qty 1

## 2020-10-17 MED ORDER — ONDANSETRON HCL 4 MG/2ML IJ SOLN: 4.0000 mg | Freq: Four times a day (QID) | INTRAMUSCULAR | Status: DC | PRN

## 2020-10-17 MED ORDER — BISACODYL 10 MG RE SUPP: 10.0000 mg | Freq: Every day | RECTAL | Status: DC | PRN

## 2020-10-17 MED ORDER — CEFAZOLIN SODIUM-DEXTROSE 1-4 GM/50ML-% IV SOLN
1.0000 g | Freq: Four times a day (QID) | INTRAVENOUS | Status: AC
  Administered 2020-10-17 (×3): 1 g via INTRAVENOUS
  Filled 2020-10-17 (×4): qty 50

## 2020-10-17 MED ORDER — VITAMIN D (ERGOCALCIFEROL) 1.25 MG (50000 UNIT) PO CAPS
50000.0000 [IU] | ORAL_CAPSULE | ORAL | Status: DC
  Administered 2020-10-17: 50000 [IU] via ORAL
  Filled 2020-10-17: qty 1

## 2020-10-17 MED ORDER — OXYCODONE HCL 5 MG PO TABS
5.0000 mg | ORAL_TABLET | ORAL | Status: DC | PRN
  Administered 2020-10-17 – 2020-10-19 (×2): 5 mg via ORAL
  Filled 2020-10-17 (×2): qty 1

## 2020-10-17 MED ORDER — ONDANSETRON HCL 4 MG PO TABS: 4.0000 mg | ORAL_TABLET | Freq: Four times a day (QID) | ORAL | Status: DC | PRN

## 2020-10-17 MED ORDER — ENOXAPARIN SODIUM 40 MG/0.4ML ~~LOC~~ SOLN
40.0000 mg | SUBCUTANEOUS | Status: DC
  Administered 2020-10-18 – 2020-10-19 (×2): 40 mg via SUBCUTANEOUS
  Filled 2020-10-17 (×2): qty 0.4

## 2020-10-17 MED ORDER — ACETAMINOPHEN 500 MG PO TABS: 1000.0000 mg | ORAL_TABLET | Freq: Three times a day (TID) | ORAL | Status: DC

## 2020-10-17 MED ORDER — OXYCODONE HCL 5 MG PO TABS: 10.0000 mg | ORAL_TABLET | ORAL | Status: DC | PRN

## 2020-10-17 MED ORDER — PHENOL 1.4 % MT LIQD: 1.0000 | OROMUCOSAL | Status: DC | PRN

## 2020-10-17 MED ORDER — CELECOXIB 100 MG PO CAPS
100.0000 mg | ORAL_CAPSULE | Freq: Two times a day (BID) | ORAL | Status: DC
  Administered 2020-10-17 – 2020-10-19 (×5): 100 mg via ORAL
  Filled 2020-10-17 (×6): qty 1

## 2020-10-17 NOTE — Telephone Encounter (Signed)
Called [facility]. [name] was unavailable. I left a message for her to cancel the order.

## 2020-10-17 NOTE — Progress Notes (Signed)
   ORTHOPAEDIC PROGRESS NOTE  s/p Procedure(s): RIGHT REVERSE SHOULDER ARTHROPLASTY on 10/15/2020 with Dr. Everardo Pacific Open reduction internal fixation of left distal tibia fracture on 10/13/2020 with Dr. Jena Gauss  SUBJECTIVE: Patient sitting up in hospital bed. Saying pain medicine makes her feel dizzy. Wants to know about bed availability for a SNF. Having more pain today because the nerve block has worn off.   OBJECTIVE: PE:  General: NAD Respiratory: no increased work of breathing Cardiac: regular rate Right upper extremity: Dressing CDI and sling well fitting,  full and painless ROM throughout hand with DPC of 0. + Motor in  AIN, PIN, Ulnar distributions. Axillary nerve sensation preserved and symmetric.  Sensation intact in medial, radial, and ulnar distributions. Well perfused digits.  Left lower extremity: Splint CDI. Wiggles toes. Neuro deficit at baseline. Warm well perfused foot.   Vitals:   10/16/20 1920 10/17/20 0300  BP: (!) 109/55 (!) 104/53  Pulse: 81 77  Resp: 17 16  Temp: 98.5 F (36.9 C) 98.4 F (36.9 C)  SpO2: 98% 99%     ASSESSMENT: [name] is a 74 y.o. female   1. POD #4 ORIF left distal tibia fracture 2. POD #2 Right reverse total shoulder  PLAN: Weightbearing:   1. RUE: Okay to use her arm for ADLs in front of the body at abdominal level and transfers. Sling for all other activities including sleep  2. LLE: NWB Insicional and dressing care:   1. RUE: Reinforce as needed  2. LLE: Keep splint clean and dry.  Orthopedic device(s):   1. RUE: Sling  2. Splint VTE prophylaxis: Lovenox 40mg  qd  Pain control:  - PRN pain medications, preferring oral medications.  - Will discontinue IV narcotics.  - Feels dizzy with 10mg  oxycodone. Will decrease dosage. - Placed order for [employer]Joy IceMan Follow - up plan: TBD. PT/OT evaluations today. Will likely need SNF. Would like [facility] or [facility]. TOC following.  Contact information:  Dr. ,  PA-C, After hours and holidays please check [email or web address] for group call information for [facility]    Ramond Marrow, PA-C 10/17/2020

## 2020-10-17 NOTE — Telephone Encounter (Signed)
You can tell them to cancel the orders. She broke her leg and needed surgery Still in the hospital and will be going to rehab after

## 2020-10-17 NOTE — Plan of Care (Signed)

## 2020-10-17 NOTE — Telephone Encounter (Signed)
[name] with [facility] left a voicemail stating that they had received orders for hospital bed, lift and pressure reducing mattress which would be a gel mattress. [name] stated that they need the most recent office notes regarding these orders. [name] stated that they need a narrative stating why these items are required. [name] stated that narrative needs to show that patient requires frequent turning and is susceptible to pressure wounds.  Message was hard to understand. Called and spoke to [place] and was advised that [facility] had faxed paperwork over with what is needed in order for insurance to cover these items.

## 2020-10-17 NOTE — Progress Notes (Signed)
Called [facility] for the don joyce ice machine for the patient and they said it has to be ordered and they will give me a call back when they find any

## 2020-10-17 NOTE — Progress Notes (Signed)
Occupational Therapy Treatment Patient Details Name: [name] MRN: [id number] DOB: 27-Dec-1945 Today's Date: 10/17/2020    History of present illness 74 year old female with a history of multiple sclerosis with a L distal tibia and fibula fracture, now s/p ORIF, NWB; Uses wheelchair for mobility; She does transfer but uses primarily her upper extremities to her.  She has a rotator cuff tear that has limited her ability to do this and underwent reverse total shoulder arthroplasty on 11/1.Marland Kitchen  She just got over a left ankle sprain. has a past medical history of Ankle fracture, left (07/2006), Ankle fracture, right (10/30/06), Compression fracture (2011), Disc herniation, MS (multiple sclerosis) (HCC), MVP (mitral valve prolapse) (1979), Nephrolithiasis, Osteoporosis, Rib fractures (1998, 4854,6270), and Toe fracture (1998).   OT comments  Pt progressing towards OT goals gradually with session focusing on UB ADLs while maintaining shoulder precautions and using compensatory strategies. Pt limited today by pain, decreased ability to use R dominant hand and reports of dizziness from pain medications. Instructed pt in UB bathing, UB dressing and sling mgmt with overall Mod A to Max A for these tasks. Educated pt on possible compensatory strategies for LB dressing bed level with use of adjustable bed at home if able to acquire to increase ability to reach B feet. Continue to recommend SNF for short term rehab.    Follow Up Recommendations  SNF    Equipment Recommendations  Hospital bed    Recommendations for Other Services      Precautions / Restrictions Precautions Precautions: Fall;Shoulder Type of Shoulder Precautions: total reverse Shoulder Interventions: Don joy ultra sling;Shoulder abduction pillow Precaution Booklet Issued: Yes (comment) Precaution Comments: allowed to use R UE arm for ADLs in front of the body at abdominal level and transfers. In sling at all other times. No shoulder  extension Required Braces or Orthoses: Sling Restrictions Weight Bearing Restrictions: Yes LLE Weight Bearing: Non weight bearing Other Position/Activity Restrictions: NWB R UE       Mobility Bed Mobility                  Transfers                      Balance                                           ADL either performed or assessed with clinical judgement   ADL Overall ADL's : Needs assistance/impaired Eating/Feeding: Set up;Sitting Eating/Feeding Details (indicate cue type and reason): Pt trialed spaghetti for lunch today instead of finger foods. Difficulty managing with L UE, messy - cued for techniques to try, positioning Grooming: Minimal assistance;Bed level;Applying deodorant Grooming Details (indicate cue type and reason): Mod A to don deodorant under B UE, cues for sequencing. Will require less assist for other grooming tasks Upper Body Bathing: Adhering to UE precautions;Moderate assistance;Bed level Upper Body Bathing Details (indicate cue type and reason): Educated on precautions with pt demo reaching and strategies     Upper Body Dressing : Moderate assistance;Bed level;Cueing for UE precautions Upper Body Dressing Details (indicate cue type and reason): Mod A for donning hospital gown using compensatory strategies, Mod - Max A for sling mgmt. Continued difficulty with clasps, but able to manage some   Lower Body Dressing Details (indicate cue type and reason): Educated on circle sitting in bed to don pants,  appropriate clothing to ease LB dressing                General ADL Comments: Pt limited by pain and anxiety, improving problem solving for UB ADLs but continues to require extensive assist      Vision   Vision Assessment?: No apparent visual deficits   Perception     Praxis      Cognition Arousal/Alertness: Awake/alert Behavior During Therapy: WFL for tasks assessed/performed;Anxious Overall Cognitive Status:  Impaired/Different from baseline Area of Impairment: Memory;Problem solving                     Memory: Decreased short-term memory       Problem Solving: Requires verbal cues;Requires tactile cues General Comments: Cues for sequencing multi step novel tasks        Exercises     Shoulder Instructions Shoulder Instructions Donning/doffing shirt without moving shoulder: Moderate assistance Method for sponge bathing under operated UE: Moderate assistance Donning/doffing sling/immobilizer: Maximal assistance Correct positioning of sling/immobilizer: Minimal assistance     General Comments      Pertinent Vitals/ Pain       Pain Assessment: 0-10 Pain Score: 10-Worst pain ever Pain Location: R shoulder Pain Descriptors / Indicators: Aching;Discomfort ("worst pain I've ever felt") Pain Intervention(s): Limited activity within patient's tolerance;Monitored during session;Repositioned;Premedicated before session  Home Living                                          Prior Functioning/Environment              Frequency  Min 2X/week        Progress Toward Goals  OT Goals(current goals can now be found in the care plan section)  Progress towards OT goals: Progressing toward goals  Acute Rehab OT Goals Patient Stated Goal: indicated she plans on going to SNF OT Goal Formulation: With patient Time For Goal Achievement: 10/30/20 Potential to Achieve Goals: Good ADL Goals Pt Will Perform Upper Body Bathing: with min assist;sitting Pt Will Perform Lower Body Bathing: with min assist;sitting/lateral leans;sit to/from stand;with adaptive equipment Pt Will Perform Upper Body Dressing: with min assist;sitting Pt Will Perform Lower Body Dressing: with min assist;sitting/lateral leans;sit to/from stand;with adaptive equipment Pt Will Transfer to Toilet: with mod assist;anterior/posterior transfer;bedside commode Pt/caregiver will Perform Home Exercise  Program: Both right and left upper extremity;With theraputty;Independently;With written HEP provided  Plan Discharge plan remains appropriate    Co-evaluation                 AM-PAC OT "6 Clicks" Daily Activity     Outcome Measure   Help from another person eating meals?: A Little Help from another person taking care of personal grooming?: A Little Help from another person toileting, which includes using toliet, bedpan, or urinal?: Total Help from another person bathing (including washing, rinsing, drying)?: A Lot Help from another person to put on and taking off regular upper body clothing?: A Lot Help from another person to put on and taking off regular lower body clothing?: A Lot 6 Click Score: 13    End of Session Equipment Utilized During Treatment: Other (comment) (sling)  OT Visit Diagnosis: Unsteadiness on feet (R26.81);Other abnormalities of gait and mobility (R26.89);Muscle weakness (generalized) (M62.81);Other symptoms and signs involving the nervous system (R29.898);Pain Pain - Right/Left: Right Pain - part of body: Shoulder  Activity Tolerance Patient limited by pain   Patient Left in bed;with call bell/phone within reach;with bed alarm set   Nurse Communication Patient requests pain meds;Mobility status;Other (comment) (feeding difficulties)        Time: 6256-3893 OT Time Calculation (min): 42 min  Charges: OT General Charges $OT Visit: 1 Visit OT Treatments $Self Care/Home Management : 38-52 mins  Lorre Munroe, OTR/L   Lorre Munroe 10/17/2020, 1:50 PM

## 2020-10-17 NOTE — Progress Notes (Signed)
PROGRESS NOTE    [name]  IWP:[id number] DOB: [birth year] DOA: 10/12/2020 PCP: Karie Schwalbe, MD    Brief Narrative:  [name] is a 74 year old female with past medical history notable for multiple sclerosis in which she is wheelchair-bound at baseline, MVP who was sent to the emergency department from her orthopedist office for need of operative management for a left tibia/fibular fracture.  Patient initially injured her leg after being caught in the wheelchair and was immobilized with a cast and her orthopedist office.  Patient continues with left lower extremity pain, otherwise no other complaints.  In the ED, WBC count 10.8, hemoglobin 13.7, platelet 548, sodium 142, potassium 4.2, chloride 104, bicarbonate 25, BUN 16, creatinine 0.6, glucose 117.  INR 1.0.  SARS-CoV-2/Covid-19 PCR negative.  Hospitalist service consulted for admission.   Assessment & Plan:   Principal Problem:   Tibia/fibula fracture Active Problems:   Multiple sclerosis (HCC)   Paraplegia, unspecified (HCC)   Neurogenic bladder   Left tibia/fibula fracture Patient presenting to the ED from her orthopedist's office, Dr. Eulah Pont for need of operative management of her acute left tibia/fibula fracture.  Patient is wheelchair-bound at baseline due to her underlying multiple sclerosis and injured her left leg after being caught in the wheelchair causing the leg to twist and fracture.  Splinted in orthopedics office prior to ED arrival.  Patient underwent ORIF of left tibia fracture by Dr. Jena Gauss on 10/13/2020. --NWB LLE --Robaxin 500 mg p.o. every 6 hours as needed muscle spasm --Lovenox for postoperative DVT prophylaxis --TOC consult for SNF placement; patient prefers [facility] or [facility] --Pain medication causes dizziness.  Appreciate Ortho assistance and addressing pain medication.  Monitor response.  Right shoulder rotator cuff tear Underwent right reverse total shoulder arthroplasty,  10/15/2020 by Dr. Everardo Pacific.  --Right upper extremity okay to use arm for ADLs in front of body at abdominal level and transfers, sling for all other activities including sleep Currently has orders for[employer]  Multiple sclerosis Paraplegia Neurogenic bladder Follows with outpatient neurology, Dr. Epimenio Foot.  Recently seen on 07/03/2020, no new lesions on MRI and not started on disease modifying therapy.  Patient reports not on oxybutynin as it is listed on her home medications. --Monitor urine output   DVT prophylaxis: Lovenox Code Status: Full code Family Communication: No family present at bedside this morning  Disposition Plan:  Status is: Inpatient  Remains inpatient appropriate because:Ongoing active pain requiring inpatient pain management, Unsafe d/c plan, IV treatments appropriate due to intensity of illness or inability to take PO and Inpatient level of care appropriate due to severity of illness   Dispo: The patient is from: Home              Anticipated d/c is to: SNF              Anticipated d/c date is: 1 day              Patient currently is not medically stable to d/c.   Consultants:   Orthopedics, Dr. Jena Gauss, Dr. Everardo Pacific  Procedures:   ORIF left tibia fracture, Dr. Jena Gauss 10/13/2020  Right reverse total shoulder arthroplasty, Dr. Everardo Pacific 10/15/2020  Antimicrobials:   Perioperative cefazolin   Subjective: No nausea no vomiting.  Leukocytosis of.  Patient denies any symptoms of fever chills nausea vomiting diarrhea constipation and burning urination cough.  Pain still is an issue and reports dizziness with pain medication and she is planning to take down her pain regimen.  Explained to patient that she may have to explain her options for SNF choices otherwise it will delay her recovery.  Objective: Vitals:   10/16/20 0800 10/16/20 1638 10/16/20 1920 10/17/20 0300  BP: 118/77 (!) 107/53 (!) 109/55 (!) 104/53  Pulse: 85 85 81 77  Resp: 18 17 17 16   Temp: 98.1  F (36.7 C) 98.3 F (36.8 C) 98.5 F (36.9 C) 98.4 F (36.9 C)  TempSrc: Oral Oral Oral Axillary  SpO2: 95% 95% 98% 99%  Weight:      Height:        Intake/Output Summary (Last 24 hours) at 10/17/2020 1716 Last data filed at 10/17/2020 1500 Gross per 24 hour  Intake 480 ml  Output 1200 ml  Net -720 ml   Filed Weights   10/12/20 1759  Weight: 51.3 kg    Examination:  General exam: Appears calm and comfortable  Respiratory system: Clear to auscultation. Respiratory effort normal.  Oxygenating well on room air  Cardiovascular system: S1 & S2 heard, RRR. No JVD, murmurs, rubs, gallops or clicks. No pedal edema. Gastrointestinal system: Abdomen is nondistended, soft and nontender. No organomegaly or masses felt. Normal bowel sounds heard. Central nervous system: Alert and oriented. No focal neurological deficits. Extremities: Left lower extremity with splint in place, right shoulder with sling in place, neurovascular intact Skin: No rashes, lesions or ulcers Psychiatry: Judgement and insight appear normal. Mood & affect appropriate.     Data Reviewed: I have personally reviewed following labs and imaging studies  CBC: Recent Labs  Lab 10/13/20 2043 10/14/20 1036 10/15/20 0825 10/16/20 0212 10/17/20 0139  WBC 8.1 14.3* 9.8 11.4* 13.5*  HGB 10.8* 10.8* 11.3* 11.9* 10.5*  HCT 33.5* 34.3* 35.7* 36.3 32.7*  MCV 93.8 96.6 93.9 93.6 93.2  PLT 444* 383 477* 523* 400   Basic Metabolic Panel: Recent Labs  Lab 10/12/20 2331 10/13/20 2043 10/15/20 0825 10/16/20 0212 10/17/20 0139  NA 142  --  140 137 134*  K 4.2  --  4.2 4.4 4.2  CL 104  --  107 103 101  CO2 25  --  25 24 24   GLUCOSE 117*  --  89 159* 125*  BUN 16  --  10 9 11   CREATININE 0.68 0.78 0.63 0.57 0.60  CALCIUM 9.5  --  8.9 9.1 8.5*   GFR: Estimated Creatinine Clearance: 50 mL/min (by C-G formula based on SCr of 0.6 mg/dL). Liver Function Tests: No results for input(s): AST, ALT, ALKPHOS, BILITOT, PROT,  ALBUMIN in the last 168 hours. No results for input(s): LIPASE, AMYLASE in the last 168 hours. No results for input(s): AMMONIA in the last 168 hours. Coagulation Profile: Recent Labs  Lab 10/12/20 2331  INR 1.0   Cardiac Enzymes: No results for input(s): CKTOTAL, CKMB, CKMBINDEX, TROPONINI in the last 168 hours. BNP (last 3 results) No results for input(s): PROBNP in the last 8760 hours. HbA1C: No results for input(s): HGBA1C in the last 72 hours. CBG: No results for input(s): GLUCAP in the last 168 hours. Lipid Profile: No results for input(s): CHOL, HDL, LDLCALC, TRIG, CHOLHDL, LDLDIRECT in the last 72 hours. Thyroid Function Tests: No results for input(s): TSH, T4TOTAL, FREET4, T3FREE, THYROIDAB in the last 72 hours. Anemia Panel: No results for input(s): VITAMINB12, FOLATE, FERRITIN, TIBC, IRON, RETICCTPCT in the last 72 hours. Sepsis Labs: No results for input(s): PROCALCITON, LATICACIDVEN in the last 168 hours.  Recent Results (from the past 240 hour(s))  Respiratory Panel by RT PCR (Flu  A&B, Covid) - Nasopharyngeal Swab     Status: None   Collection Time: 10/13/20  1:55 AM   Specimen: Nasopharyngeal Swab  Result Value Ref Range Status   SARS Coronavirus 2 by RT PCR NEGATIVE NEGATIVE Final    Comment: (NOTE) SARS-CoV-2 target nucleic acids are NOT DETECTED.  The SARS-CoV-2 RNA is generally detectable in upper respiratoy specimens during the acute phase of infection. The lowest concentration of SARS-CoV-2 viral copies this assay can detect is 131 copies/mL. A negative result does not preclude SARS-Cov-2 infection and should not be used as the sole basis for treatment or other patient management decisions. A negative result may occur with  improper specimen collection/handling, submission of specimen other than nasopharyngeal swab, presence of viral mutation(s) within the areas targeted by this assay, and inadequate number of viral copies (<131 copies/mL). A negative  result must be combined with clinical observations, patient history, and epidemiological information. The expected result is Negative.  Fact Sheet for Patients:  https://www.moore.com/  Fact Sheet for Healthcare Providers:  https://www.young.biz/  This test is no t yet approved or cleared by the [place] FDA and  has been authorized for detection and/or diagnosis of SARS-CoV-2 by FDA under an Emergency Use Authorization (EUA). This EUA will remain  in effect (meaning this test can be used) for the duration of the COVID-19 declaration under Section 564(b)(1) of the Act, 21 U.S.C. section 360bbb-3(b)(1), unless the authorization is terminated or revoked sooner.     Influenza A by PCR NEGATIVE NEGATIVE Final   Influenza B by PCR NEGATIVE NEGATIVE Final    Comment: (NOTE) The Xpert Xpress SARS-CoV-2/FLU/RSV assay is intended as an aid in  the diagnosis of influenza from Nasopharyngeal swab specimens and  should not be used as a sole basis for treatment. Nasal washings and  aspirates are unacceptable for Xpert Xpress SARS-CoV-2/FLU/RSV  testing.  Fact Sheet for Patients: https://www.moore.com/  Fact Sheet for Healthcare Providers: https://www.young.biz/  This test is not yet approved or cleared by the [place] FDA and  has been authorized for detection and/or diagnosis of SARS-CoV-2 by  FDA under an Emergency Use Authorization (EUA). This EUA will remain  in effect (meaning this test can be used) for the duration of the  Covid-19 declaration under Section 564(b)(1) of the Act, 21  U.S.C. section 360bbb-3(b)(1), unless the authorization is  terminated or revoked. Performed at [facility], [address], [place], [place] [address]   MRSA PCR Screening     Status: None   Collection Time: 10/13/20  3:24 AM   Specimen: Nasal Mucosa; Nasopharyngeal  Result Value Ref Range Status   MRSA  by PCR NEGATIVE NEGATIVE Final    Comment:        The GeneXpert MRSA Assay (FDA approved for NASAL specimens only), is one component of a comprehensive MRSA colonization surveillance program. It is not intended to diagnose MRSA infection nor to guide or monitor treatment for MRSA infections. Performed at [facility], [address] [place], [place] [address]          Radiology Studies: DG Shoulder Right Port  Result Date: 10/15/2020 CLINICAL DATA:  Postop right shoulder arthroplasty EXAM: PORTABLE RIGHT SHOULDER COMPARISON:  09/06/2020 FINDINGS: Internal rotation, external rotation, transscapular views of the right shoulder demonstrate right shoulder arthroplasty in the expected position without signs of acute complication. No acute fracture. Right chest is clear. IMPRESSION: 1. Unremarkable right shoulder arthroplasty. Electronically Signed   By: Sharlet Salina  M.D.   On: 10/15/2020 20:14        Scheduled Meds: . celecoxib  100 mg Oral BID  . docusate sodium  100 mg Oral BID  . enoxaparin (LOVENOX) injection  40 mg Subcutaneous Q24H  . methenamine  1 g Oral QODAY  . mupirocin ointment  1 application Nasal BID  . Vitamin D (Ergocalciferol)  50,000 Units Oral Q7 days   Continuous Infusions: .  ceFAZolin (ANCEF) IV 1 g (10/17/20 1506)  . methocarbamol (ROBAXIN) IV       LOS: 4 days    Time spent: 34 minutes spent on chart review, discussion with nursing staff, consultants, updating family and interview/physical exam; more than 50% of that time was spent in counseling and/or coordination of care.    Lynden Oxford,  [facility] Available via Epic secure chat 7am-7pm After these hours, please refer to coverage provider listed on [email or web address] 10/17/2020, 5:16 PM

## 2020-10-18 LAB — BASIC METABOLIC PANEL
Anion gap: 7 (ref 5–15)
BUN: 10 mg/dL (ref 8–23)
CO2: 27 mmol/L (ref 22–32)
Calcium: 8.7 mg/dL — ABNORMAL LOW (ref 8.9–10.3)
Chloride: 102 mmol/L (ref 98–111)
Creatinine, Ser: 0.57 mg/dL (ref 0.44–1.00)
GFR, Estimated: >60 mL/min (ref >60)
Glucose, Bld: 113 mg/dL — ABNORMAL HIGH (ref 70–99)
Potassium: 4.2 mmol/L (ref 3.5–5.1)
Sodium: 136 mmol/L (ref 135–145)

## 2020-10-18 LAB — CBC
HCT: 32.3 % — ABNORMAL LOW (ref 36.0–46.0)
Hemoglobin: 10.4 g/dL — ABNORMAL LOW (ref 12.0–15.0)
MCH: 30.1 pg (ref 26.0–34.0)
MCHC: 32.2 g/dL (ref 30.0–36.0)
MCV: 93.6 fL (ref 80.0–100.0)
Platelets: 450 10*3/uL — ABNORMAL HIGH (ref 150–400)
RBC: 3.45 MIL/uL — ABNORMAL LOW (ref 3.87–5.11)
RDW: 11.6 % (ref 11.5–15.5)
WBC: 13.6 10*3/uL — ABNORMAL HIGH (ref 4.0–10.5)
nRBC: 0 % (ref 0.0–0.2)

## 2020-10-18 LAB — GLUCOSE, CAPILLARY: Glucose-Capillary: 128 mg/dL — ABNORMAL HIGH (ref 70–99)

## 2020-10-18 MED ORDER — ONDANSETRON HCL 4 MG PO TABS: 4.0000 mg | ORAL_TABLET | Freq: Three times a day (TID) | ORAL | 0 refills | Status: AC | PRN

## 2020-10-18 MED ORDER — ACETAMINOPHEN 500 MG PO TABS: 500.0000 mg | ORAL_TABLET | Freq: Three times a day (TID) | ORAL | 0 refills | Status: AC

## 2020-10-18 MED ORDER — POLYETHYLENE GLYCOL 3350 17 G PO PACK
17.0000 g | PACK | Freq: Every day | ORAL | Status: DC
  Administered 2020-10-18 – 2020-10-19 (×2): 17 g via ORAL
  Filled 2020-10-18: qty 1

## 2020-10-18 MED ORDER — CELECOXIB 100 MG PO CAPS: 100.0000 mg | ORAL_CAPSULE | Freq: Two times a day (BID) | ORAL | 0 refills | Status: AC

## 2020-10-18 MED ORDER — ENOXAPARIN SODIUM 40 MG/0.4ML ~~LOC~~ SOLN: 40.0000 mg | SUBCUTANEOUS | 0 refills | Status: DC

## 2020-10-18 MED ORDER — OXYCODONE HCL 5 MG PO TABS
ORAL_TABLET | ORAL | 0 refills | Status: DC
Start: 2020-10-18 — End: 2021-03-27

## 2020-10-18 NOTE — Discharge Instructions (Signed)
Ramond Marrow MD, MPH Alfonse Alpers, PA-C [facility] [address] [phone] (tel)   [phone] (fax)   POST-OPERATIVE INSTRUCTIONS - TOTAL SHOULDER REPLACEMENT    WOUND CARE ? You may leave the operative dressing in place until your follow-up appointment. ? KEEP THE INCISIONS CLEAN AND DRY. ? There may be a small amount of fluid/bleeding leaking at the surgical site. This is normal after surgery.  ? If it fills with liquid or blood please call us immediately to change it for you. ? Use the provided ice machine or Ice packs as often as possible for the first 3-4 days, then as needed for pain relief.  Keep a layer of cloth or a shirt between your skin and the cooling unit to prevent frost bite as it can get very cold.  SHOWERING: - You may shower on Post-Op Day #2.  - The dressing is water resistant but do not scrub it as it may start to peel up.   - You may remove the sling for showering, but keep a water resistant pillow under the arm to keep both the  elbow and shoulder away from the body (mimicking the abduction sling).  - Gently pat the area dry.  - Do not soak the shoulder in water. Do not go swimming in the pool or ocean until your sutures are removed. - KEEP THE INCISIONS CLEAN AND DRY.  EXERCISES ? Wear the sling at all times except when doing your exercises and when doing your normal activities of daily living.  ? You CAN take your arm out of your sling to help you transfer! ? You may remove the sling for showering, but keep the arm across the chest or in a secondary sling.    ? Accidental/Purposeful External Rotation and shoulder flexion (reaching behind you) is to be avoided at all costs for the first month. ? It is ok to come out of your sling if your are sitting and have assistance for eating.  Do not lift anything heavier than 1 pound until we discuss it further in clinic. ? Please perform the exercises:   . Elbow / Hand / Wrist   Range of Motion Exercises . Grip strengthening   REGIONAL ANESTHESIA (NERVE BLOCKS) . The anesthesia team may have performed a nerve block for you if safe in the setting of your care.  This is a great tool used to minimize pain.  Typically the block may start wearing off overnight but the long acting medicine may last for 3-4 days.  The nerve block wearing off can be a challenging period but please utilize your as needed pain medications to try and manage this period.    POST-OP MEDICATIONS- Multimodal approach to pain control . In general your pain will be controlled with a combination of substances.  Prescriptions unless otherwise discussed are electronically sent to your pharmacy.  This is a carefully made plan we use to minimize narcotic use.     ? Meloxicam OR Celebrex - Anti-inflammatory medication taken on a scheduled basis ? Acetaminophen - Non-narcotic pain medicine taken on a scheduled basis  ? Oxycodone - This is a strong narcotic, to be used only on an "as needed" basis for pain. ? Lovenox - This medicine is used to minimize the risk of blood clots after surgery. ? Zofran -  take as needed for nausea  Meloxicam/Celebrex - these are anti-inflammatory and pain relievers.  Do not take additional ibuprofen, naproxen or other NSAID  while taking this medicine.   FOLLOW-UP ? If you develop a Fever (>101.5), Redness or Drainage from the surgical incision site, please call our office to arrange for an evaluation. ? Please call the office to schedule a follow-up appointment for a wound check, 7-10 days post-operatively.  IF YOU HAVE ANY QUESTIONS, PLEASE FEEL FREE TO CALL OUR OFFICE.  HELPFUL INFORMATION  . If you had a block, it will wear off between 8-24 hrs postop typically.  This is period when your pain may go from nearly zero to the pain you would have had post-op without the block.  This is an abrupt transition but nothing dangerous is happening.  You may take an extra dose of  narcotic when this happens.  ? Your arm will be in a sling following surgery. You will be in this sling for the next 3-4 weeks.  I will let you know the exact duration at your follow-up visit.  ? You may be more comfortable sleeping in a semi-seated position the first few nights following surgery.  Keep a pillow propped under the elbow and forearm for comfort.  If you have a recliner type of chair it might be beneficial.  If not that is fine too, but it would be helpful to sleep propped up with pillows behind your operated shoulder as well under your elbow and forearm.  This will reduce pulling on the suture lines.  ? When dressing, put your operative arm in the sleeve first.  When getting undressed, take your operative arm out last.  Loose fitting, button-down shirts are recommended.  ? In most states it is against the law to drive while your arm is in a sling. And certainly against the law to drive while taking narcotics.  ? You may return to work/school in the next couple of days when you feel up to it. Desk work and typing in the sling is fine.  ? We suggest you use the pain medication the first night prior to going to bed, in order to ease any pain when the anesthesia wears off. You should avoid taking pain medications on an empty stomach as it will make you nauseous.  ? Do not drink alcoholic beverages or take illicit drugs when taking pain medications.  ? Pain medication may make you constipated.  Below are a few solutions to try in this order: - Decrease the amount of pain medication if you aren't having pain. - Drink lots of decaffeinated fluids. - Drink prune juice and/or each dried prunes  o If the first 3 don't work start with additional solutions - Take Colace - an over-the-counter stool softener - Take Senokot - an over-the-counter laxative - Take Miralax - a stronger over-the-counter laxative   Dental Antibiotics:  In most cases prophylactic antibiotics for Dental  procdeures after total joint surgery are not necessary.  Exceptions are as follows:  1. History of prior total joint infection  2. Severely immunocompromised (Organ Transplant, cancer chemotherapy, Rheumatoid biologic meds such as Humera)  3. Poorly controlled diabetes (A1C &gt; 8.0, blood glucose over 200)  If you have one of these conditions, contact your surgeon for an antibiotic prescription, prior to your dental procedure.

## 2020-10-18 NOTE — Plan of Care (Signed)
Patient is alert and oriented x4. Rates shoulder pain at 8/10, ice packs applied, prn oxycodone administered. Completed Zosyn antibiotic dose, Has shoulder immobilizer. LLE dressing intact. Problem: Education: Goal: Knowledge of General Education information will improve Description: Including pain rating scale, medication(s)/side effects and non-pharmacologic comfort measures Outcome: Progressing   Problem: Health Behavior/Discharge Planning: Goal: Ability to manage health-related needs will improve Outcome: Progressing   Problem: Clinical Measurements: Goal: Ability to maintain clinical measurements within normal limits will improve Outcome: Progressing Goal: Will remain free from infection Outcome: Progressing Goal: Diagnostic test results will improve Outcome: Progressing Goal: Respiratory complications will improve Outcome: Progressing Goal: Cardiovascular complication will be avoided Outcome: Progressing   Problem: Activity: Goal: Risk for activity intolerance will decrease Outcome: Progressing   Problem: Nutrition: Goal: Adequate nutrition will be maintained Outcome: Progressing   Problem: Coping: Goal: Level of anxiety will decrease Outcome: Progressing   Problem: Elimination: Goal: Will not experience complications related to bowel motility Outcome: Progressing Goal: Will not experience complications related to urinary retention Outcome: Progressing   Problem: Pain Managment: Goal: General experience of comfort will improve Outcome: Progressing   Problem: Safety: Goal: Ability to remain free from injury will improve Outcome: Progressing   Problem: Skin Integrity: Goal: Risk for impaired skin integrity will decrease Outcome: Progressing

## 2020-10-18 NOTE — Progress Notes (Signed)
Ortho Progress Note  S: Doing well. No complaints with leg. Shoulder sore. Asking about bed availability at SNF   O: General: Sitting up in bed, NAD Respiratory: No increased work of breathing at rest LLE: Splint in place, c/d/i. Wiggles toes. Neuro at baseline. Toes warm and well perfused RUE: Dressing c/d/i and sling well fitting. Has full and painless ROM throughout hand. + Motor in  AIN, PIN, Ulnar distributions. Sensation intact in medial, radial, and ulnar distributions. +radial pulse. Well perfused digits.   IMAGING: stable postop imaging L tibia  ASSESSMENT: 74 year old female with PMH significant for multiple sclerosis  1. S/P ORIF of left distal tibia 10/13/20 2. S/P right reverse total shoulder arthroplasty 10/15/20  PLAN: Weightbearing:              1. RUE: Okay to use her arm for ADLs in front of the body at abdominal level and transfers. Sling for all other activities including sleep             2. LLE: NWB Insicional and dressing care:              1. RUE: Reinforce as needed             2. LLE: Keep splint clean and dry.  Orthopedic device(s):              1. RUE: Sling             2. LLE: Splint VTE prophylaxis: Lovenox 40mg  qd  Pain control:  - PRN pain medications, preferring oral medications.  Follow - up plan: 2 weeks after discharge for LLE splint removal. Continue therapies as tolerated. Would like [facility] or [facility]. TOC following.    Contact information:  Dr. , Truitt Merle PA-C   [facility] Dolezal [facility] [phone] (office) [email or web address]

## 2020-10-18 NOTE — Plan of Care (Signed)

## 2020-10-18 NOTE — Progress Notes (Signed)
[facility] Progress Note  Patient: [name]    JIR:[id number]  DOA: 10/12/2020     Date of Service: the patient was seen and examined on 10/18/2020  Brief hospital course: Patient with past medical history of multiple sclerosis, wheelchair-bound, MVP.  Presented with injury to her leg after it got caught in the wheelchair.  Found to have left tibia-fibula fracture. Underwent ORIF on 10/13/2020 for left distal tibia fracture.   Also had right rotator cuff tear and underwent reverse right shoulder arthroplasty on 10/15/2020. Currently plan is arrange for discharge planning.  Assessment and Plan: 1.  Left tibia fibular fracture Wheelchair-bound at baseline. Injured her left leg after being caught in the wheelchair. Orthopedic was consulted. Underwent ORIF on 10/13/2020. Nonweightbearing left lower extremity. Currently pain control per orthopedics. PT OT suggest SNF. Keep splint clean and dry.   2.  Right shoulder rotator cuff tear. Underwent reverse right shoulder arthroplasty on 10/15/2020 to assist her with rapid recovery instead of regular rotator cuff treatment. Patient primarily uses her upper body for mobility due to her wheelchair-bound status. Pain control was difficult given dizziness with pain medication. Orthopedic is managing pain control with oxycodone and DonJoy Iceman. Okay to use her arm for ADLs in front of the body at abdominal level and transfers. Sling for all other activities including sleep  From orthopedic point of view patient is okay for discharge for above conditions. Explained the patient the importance of therapy and rehabilitation and timeliness of it as well.  Patient has made only limited choices regarding SNF she would like to go but currently no availability. Explained to the patient that the longer she remains in the hospital the higher chances of recovery will be delayed.  Explained to her that she is medically ready for discharge as  well.  3.  Multiple sclerosis. Paraplegia. Neurogenic bladder history. Follows up with neurology Dr. Epimenio Foot. No new lesions on the MRI back in July 2021. Not on any medication for now. Monitor closely.  4.  Constipation. Continue bowel regimen.  5.  Leukocytosis Currently stable.  Patient denies any symptoms of cough, fever, chills, chest pain, abdominal pain.  No burning urination as well.  6.  Postop blood loss anemia Drop from 13.7-10.8.  Currently hemoglobin stable. Monitor.  7.  Recurrent UTI. Patient takes Hip-Rex for prophylaxis.  Monitor.  Diet: Regular diet DVT Prophylaxis:   SCD's Start: 10/17/20 0941 enoxaparin (LOVENOX) injection 40 mg Start: 10/17/20 0940 SCDs Start: 10/13/20 1357    Advance goals of care discussion: Full code  Family Communication: no family was present at bedside, at the time of interview.   Disposition:  Status is: Inpatient  Remains inpatient appropriate because:Ongoing active pain requiring inpatient pain management   Dispo:  Patient From: Home  Planned Disposition: Skilled Nursing Facility  Expected discharge date: 10/19/20  Medically stable for discharge: No  Subjective: No nausea no vomiting.  No fever no chills.  Currently receiving DonJoy for pain control.  Passing gas but no BM.  Minimal oral intake.  Physical Exam:  General: Appear in mild distress, no Rash; Oral Mucosa Clear, moist. no Abnormal Neck Mass Or lumps, Conjunctiva normal  Cardiovascular: S1 and S2 Present, no Murmur, Respiratory: good respiratory effort, Bilateral Air entry present and CTA, no Crackles, no wheezes Abdomen: Bowel Sound present, Soft and no tenderness Extremities: no Pedal edema Neurology: alert and oriented to time, place, and person affect appropriate. no new focal deficit Gait not checked due to patient safety  concerns  Vitals:   10/17/20 0300 10/17/20 1930 10/18/20 0334 10/18/20 0855  BP: (!) 104/53 (!) 109/51 (!) 106/59 111/62   Pulse: 77 71 76 83  Resp: 16 16 15 17   Temp: 98.4 F (36.9 C) 98.3 F (36.8 C) 97.9 F (36.6 C) 98.7 F (37.1 C)  TempSrc: Axillary Axillary Oral Oral  SpO2: 99% 98% 94% 94%  Weight:      Height:        Intake/Output Summary (Last 24 hours) at 10/18/2020 1615 Last data filed at 10/18/2020 0347 Gross per 24 hour  Intake 455.66 ml  Output 1200 ml  Net -744.34 ml   Filed Weights   10/12/20 1759  Weight: 51.3 kg    Data Reviewed: I have personally reviewed and interpreted daily labs, tele strips, imagings as discussed above. I reviewed all nursing notes, pharmacy notes, vitals, pertinent old records I have discussed plan of care as described above with RN and patient/family.  CBC: Recent Labs  Lab 10/14/20 1036 10/15/20 0825 10/16/20 0212 10/17/20 0139 10/18/20 0130  WBC 14.3* 9.8 11.4* 13.5* 13.6*  HGB 10.8* 11.3* 11.9* 10.5* 10.4*  HCT 34.3* 35.7* 36.3 32.7* 32.3*  MCV 96.6 93.9 93.6 93.2 93.6  PLT 383 477* 523* 400 450*   Basic Metabolic Panel: Recent Labs  Lab 10/12/20 2331 10/12/20 2331 10/13/20 2043 10/15/20 0825 10/16/20 0212 10/17/20 0139 10/18/20 0130  NA 142  --   --  140 137 134* 136  K 4.2  --   --  4.2 4.4 4.2 4.2  CL 104  --   --  107 103 101 102  CO2 25  --   --  25 24 24 27   GLUCOSE 117*  --   --  89 159* 125* 113*  BUN 16  --   --  10 9 11 10   CREATININE 0.68   < > 0.78 0.63 0.57 0.60 0.57  CALCIUM 9.5  --   --  8.9 9.1 8.5* 8.7*   < > = values in this interval not displayed.    Studies: No results found.  Scheduled Meds: . celecoxib  100 mg Oral BID  . docusate sodium  100 mg Oral BID  . enoxaparin (LOVENOX) injection  40 mg Subcutaneous Q24H  . methenamine  1 g Oral QODAY  . polyethylene glycol  17 g Oral Daily  . Vitamin D (Ergocalciferol)  50,000 Units Oral Q7 days   Continuous Infusions: PRN Meds: bisacodyl, diphenhydrAMINE, menthol-cetylpyridinium, ondansetron **OR** ondansetron (ZOFRAN) IV, oxyCODONE, phenol  Time  spent: 35 minutes  Author: 13/04/21, MD [facility] 10/18/2020 4:15 PM  To reach On-call, see care teams to locate the attending and reach out via www. . Between 7PM-7AM, please contact night-coverage If you still have difficulty reaching the attending provider, please page the [facility] (Director on Call) for [facility] on amion for assistance.

## 2020-10-18 NOTE — Progress Notes (Signed)
   ORTHOPAEDIC PROGRESS NOTE  s/p Procedure(s): RIGHT REVERSE SHOULDER ARTHROPLASTY on 10/15/2020 with Dr. Everardo Pacific Open reduction internal fixation of left distal tibia fracture on 10/13/2020 with Dr. Jena Gauss  SUBJECTIVE: Pain in shoulder improving. Did well with adjustment of pain meds. Frustrated because there have been no available beds for the rehab facilities she would like to go to.   OBJECTIVE: PE:  General: NAD Respiratory: no increased work of breathing Cardiac: regular rate Right upper extremity: Dressing CDI and sling well fitting,  full and painless ROM throughout hand with DPC of 0. + Motor in  AIN, PIN, Ulnar distributions. Axillary nerve sensation preserved and symmetric.  Sensation intact in medial, radial, and ulnar distributions. Well perfused digits.  Left lower extremity: Splint CDI. Wiggles toes. Warm well perfused foot.   Vitals:   10/17/20 1930 10/18/20 0334  BP: (!) 109/51 (!) 106/59  Pulse: 71 76  Resp: 16 15  Temp: 98.3 F (36.8 C) 97.9 F (36.6 C)  SpO2: 98% 94%     ASSESSMENT: [name] is a 74 y.o. female   1. POD #5 ORIF left distal tibia fracture 2. POD #3 Right reverse total shoulder  PLAN: Weightbearing:   1. RUE: Okay to use her arm for ADLs in front of the body at abdominal level and transfers. Sling for all other activities including sleep  2. LLE: NWB Insicional and dressing care:   1. RUE: Reinforce as needed  2. LLE: Keep splint clean and dry.  Orthopedic device(s):   1. RUE: Sling  2. Splint VTE prophylaxis: Lovenox 40mg  qd  Pain control:  - PRN pain medications, preferring oral medications.  - Discontinued IV narcotics.  - Pain controlled with decreased dosage of oxycodone.  - Placed order for DonJoy IceMan Follow - up plan: TBD. PT/OT recommending SNF. Would like [facility] or [facility] - but has been told no bed availability. TOC following.  Contact information:  Dr. , Ramond Marrow PA-C, After hours and  holidays please check [email or web address] for group call information for [facility]  Printed prescriptions for discharge medications were placed in patient's chart. Once cleared by medicine team and therapies, she is okay for discharge from orthopedics standpoint.   Follow-up with Dr. Alfonse Alpers 2 weeks after discharge for LLE splint removal Follow-up with Dr. Jena Gauss 1 week after discharge for incision check and x-ray.    Everardo Pacific, PA-C 10/18/2020

## 2020-10-19 LAB — CBC
HCT: 33 % — ABNORMAL LOW (ref 36.0–46.0)
Hemoglobin: 10.7 g/dL — ABNORMAL LOW (ref 12.0–15.0)
MCH: 30.5 pg (ref 26.0–34.0)
MCHC: 32.4 g/dL (ref 30.0–36.0)
MCV: 94 fL (ref 80.0–100.0)
Platelets: 447 10*3/uL — ABNORMAL HIGH (ref 150–400)
RBC: 3.51 MIL/uL — ABNORMAL LOW (ref 3.87–5.11)
RDW: 11.7 % (ref 11.5–15.5)
WBC: 12.2 10*3/uL — ABNORMAL HIGH (ref 4.0–10.5)
nRBC: 0 % (ref 0.0–0.2)

## 2020-10-19 LAB — BASIC METABOLIC PANEL
Anion gap: 8 (ref 5–15)
BUN: 9 mg/dL (ref 8–23)
CO2: 27 mmol/L (ref 22–32)
Calcium: 8.9 mg/dL (ref 8.9–10.3)
Chloride: 103 mmol/L (ref 98–111)
Creatinine, Ser: 0.56 mg/dL (ref 0.44–1.00)
GFR, Estimated: >60 mL/min (ref >60)
Glucose, Bld: 95 mg/dL (ref 70–99)
Potassium: 4.2 mmol/L (ref 3.5–5.1)
Sodium: 138 mmol/L (ref 135–145)

## 2020-10-19 LAB — SARS CORONAVIRUS 2 BY RT PCR (HOSPITAL ORDER, PERFORMED IN ~~LOC~~ HOSPITAL LAB): SARS Coronavirus 2: NEGATIVE

## 2020-10-19 MED ORDER — METHENAMINE HIPPURATE 1 G PO TABS: 1.0000 g | ORAL_TABLET | ORAL | 0 refills | Status: DC

## 2020-10-19 MED ORDER — VITAMIN D (ERGOCALCIFEROL) 1.25 MG (50000 UNIT) PO CAPS
50000.0000 [IU] | ORAL_CAPSULE | ORAL | 0 refills | Status: DC
Start: 2020-10-24 — End: 2021-03-27

## 2020-10-19 MED ORDER — DOCUSATE SODIUM 100 MG PO CAPS
100.0000 mg | ORAL_CAPSULE | Freq: Two times a day (BID) | ORAL | 0 refills | Status: DC
Start: 2020-10-19 — End: 2021-03-27

## 2020-10-19 MED ORDER — POLYETHYLENE GLYCOL 3350 17 G PO PACK
17.0000 g | PACK | Freq: Every day | ORAL | 0 refills | Status: DC
Start: 2020-10-20 — End: 2021-03-27

## 2020-10-19 NOTE — Discharge Summary (Signed)
[facility] Discharge Summary   Patient: [name]:[id number]  PCP: Karie Schwalbe, MD  Date of admission: 10/12/2020   Date of discharge:  10/19/2020     Discharge Diagnoses:  Principal Problem:   Tibia/fibula fracture Active Problems:   Multiple sclerosis (HCC)   Paraplegia, unspecified (HCC)   Neurogenic bladder   Admitted From: home Disposition:  SNF   Recommendations for Outpatient Follow-up:  1. PCP: follow up in 1 week 2. Follow up LABS/TEST:  none   Follow-up Information    Bjorn Pippin, MD In 1 week.   Specialty: Orthopedic Surgery Why: To re-check right shoulder Contact information: [address] [place] [address] [phone]        Tillman Abide I, MD. Schedule an appointment as soon as possible for a visit in 1 week(s).   Specialties: Internal Medicine, Pediatrics Contact information: [address] [place] [address] [phone]              Diet recommendation: Cardiac diet  Activity: The patient is advised to gradually reintroduce usual activities, as tolerated  Discharge Condition: stable  Code Status: Full code   History of present illness: As per the H and P dictated on admission, "[name] is a 74 y.o. female with medical history significant of MS, MVP sent to the ED by orthopedics for hospital admission.  Patient sustained a left tib-fib fracture.  Fracture was casted and wrapped at orthopedics office today.  Patient was sent to the ED by Dr. Eulah Pont from orthopedics requesting hospital admission with anticipated surgical management by Dr. Jena Gauss either Saturday or Sunday.  Patient is wheelchair-bound due to MS.  She injured her leg after it got caught in the wheelchair and her son did not recognize that.  He continued to wheel her causing the leg to twist and fracture.  No other injuries reported.  Patient reports pain in her left leg.  She has no other complaints.  Denies fevers,  chills, cough, shortness of breath, chest pain, nausea, vomiting, abdominal pain, or diarrhea.  ED Course: Hemodynamically stable.  WBC 10.8, hemoglobin 13.7, hematocrit 43.4, platelet 548k.  Platelet count was slightly elevated on labs done 4 months ago as well.  Sodium 142, potassium 4.2, chloride 104, bicarb 25, BUN 16, creatinine 0.6, glucose 117.  INR 1.0.  Screening SARS-CoV-2 PCR test pending."  Hospital Course:  Patient with past medical history of multiple sclerosis, wheelchair-bound, MVP.  Presented with injury to her leg after it got caught in the wheelchair.  Found to have left tibia-fibula fracture. Underwent ORIF on 10/13/2020 for left distal tibia fracture. Also had right rotator cuff tear and underwent reverse right shoulder arthroplasty on 10/15/2020.  Summary of her active problems in the hospital is as following. 1.  Left tibia fibular fracture Wheelchair-bound at baseline. Injured her left leg after being caught in the wheelchair. Orthopedic was consulted. Underwent ORIF on 10/13/2020. Nonweightbearing left lower extremity. Currently pain control per orthopedics. PT OT suggest SNF. Keep splint clean and dry.   2.  Right shoulder rotator cuff tear. Underwent reverse right shoulder arthroplasty on 10/15/2020 to assist her with rapid recovery instead of regular rotator cuff treatment. Patient primarily uses her upper body for mobility due to her wheelchair-bound status. Pain control was difficult given dizziness with pain medication. Orthopedic is managing pain control with oxycodone and DonJoy Iceman. Okay to use her arm for ADLs in front of the body at abdominal level  and transfers. Sling for all other activities including sleep  From orthopedic point of view patient is okay for discharge for above conditions. Explained the patient the importance of therapy and rehabilitation and timeliness of it as well.  Patient has made only limited choices regarding SNF she would  like to go but currently no availability. Explained to the patient that the longer she remains in the hospital the higher chances of recovery will be delayed.  Explained to her that she is medically ready for discharge as well.  3.  Multiple sclerosis. Paraplegia. Neurogenic bladder history. Follows up with neurology Dr. Epimenio Foot. No new lesions on the MRI back in July 2021. Not on any medication for now. Monitor closely.  4.  Constipation. Continue bowel regimen.  5.  Leukocytosis Currently stable.  Patient denies any symptoms of cough, fever, chills, chest pain, abdominal pain.  No burning urination as well.  6.  Postop blood loss anemia Drop from 13.7-10.8.  Currently hemoglobin stable. Monitor.  7.  Recurrent UTI. Patient takes HippuRex for prophylaxis every other day. continue   Pain control  - [facility] Controlled Substance Reporting System database was reviewed. - Patient was instructed, not to drive, operate heavy machinery, perform activities at heights, swimming or participation in water activities or provide baby sitting services while on Pain, Sleep and Anxiety Medications; until her outpatient Physician has advised to do so again.  - Also recommended to not to take more than prescribed Pain, Sleep and Anxiety Medications.  Patient was seen by physical therapy, who recommended SNF,  was arranged. On the day of the discharge the patient's vitals were stable, and no other acute medical condition were reported by patient. The patient was felt safe to be discharge at SNF with therapy.  Consultants: Orthopedics   Procedures: ORIF on 10/13/2020 for left distal tibia fracture. right rotator cuff tear and underwent reverse right shoulder arthroplasty on 10/15/2020.   Discharge Exam: General: Appear in no distress, no Rash; Oral Mucosa Clear, moist. no Abnormal Neck Mass Or lumps, Conjunctiva normal  Cardiovascular: S1 and S2 Present, no Murmur Respiratory: good  respiratory effort, Bilateral Air entry present and CTA, no Crackles, no wheezes Abdomen: Bowel Sound present, Soft and no tenderness Extremities: no Pedal edema Neurology: alert and oriented to time, place, and person affect appropriate. no new focal deficit  Filed Weights   10/12/20 1759  Weight: 51.3 kg   Vitals:   10/18/20 2030 10/19/20 0541  BP: (!) 114/94 109/61  Pulse: 86 78  Resp: 17 17  Temp: 99 F (37.2 C) 98.1 F (36.7 C)  SpO2: 96% 96%    DISCHARGE MEDICATION: Allergies as of 10/19/2020      Reactions   Lidocaine    rash   Procaine Hcl    REACTION: UNSPECIFIED      Medication List    STOP taking these medications   oxybutynin 5 MG tablet Commonly known as: DITROPAN     TAKE these medications   acetaminophen 500 MG tablet Commonly known as: TYLENOL Take 1 tablet (500 mg total) by mouth every 8 (eight) hours for 14 days.   celecoxib 100 MG capsule Commonly known as: CeleBREX Take 1 capsule (100 mg total) by mouth 2 (two) times daily. For two weeks. Then take as needed for pain   cyanocobalamin 1000 MCG/ML injection Commonly known as: (VITAMIN B-12) Inject 1,000 mcg into the muscle once.   docusate sodium 100 MG capsule Commonly known as: COLACE Take 1 capsule (100  mg total) by mouth 2 (two) times daily.   enoxaparin 40 MG/0.4ML injection Commonly known as: LOVENOX Inject 0.4 mLs (40 mg total) into the skin daily.   methenamine 1 g tablet Commonly known as: HIPREX Take 1 tablet (1 g total) by mouth every other day.   ondansetron 4 MG tablet Commonly known as: Zofran Take 1 tablet (4 mg total) by mouth every 8 (eight) hours as needed for up to 7 days for nausea or vomiting.   oxyCODONE 5 MG immediate release tablet Commonly known as: Oxy IR/ROXICODONE Take 0.5 - 1 pill every 6 hrs as needed for pain, no more than 6 pills daily   polyethylene glycol 17 g packet Commonly known as: MIRALAX / GLYCOLAX Take 17 g by mouth daily. Start taking  on: October 20, 2020   Vitamin D (Ergocalciferol) 1.25 MG (50000 UNIT) Caps capsule Commonly known as: DRISDOL Take 1 capsule (50,000 Units total) by mouth every 7 (seven) days. Start taking on: October 24, 2020      Allergies  Allergen Reactions  . Lidocaine     rash  . Procaine Hcl     REACTION: UNSPECIFIED   Discharge Instructions    Diet - low sodium heart healthy   Complete by: As directed    Increase activity slowly   Complete by: As directed    No wound care   Complete by: As directed       The results of significant diagnostics from this hospitalization (including imaging, microbiology, ancillary and laboratory) are listed below for reference.    Significant Diagnostic Studies: DG Tibia/Fibula Left  Result Date: 10/13/2020 CLINICAL DATA:  Open reduction internal fixation of left tibial fracture. EXAM: DG C-ARM 1-60 MIN; LEFT TIBIA AND FIBULA - 2 VIEW FLUOROSCOPY TIME:  Fluoroscopy Time:  1 minutes and 5 seconds Number of Acquired Spot Images: 0 COMPARISON:  July 19, 2006 FINDINGS: Intraoperative fluoroscopic images from sideplate and screw fixation of comminuted impacted distal left tibial fracture demonstrate improved alignment and no evidence of immediate complications. There is an associated mildly comminuted distal fibular fracture. IMPRESSION: 1. Intraoperative fluoroscopic images from sideplate and screw fixation of comminuted impacted distal left tibial fracture. 2. Mildly comminuted distal fibular fracture. Electronically Signed   By: Ted Mcalpine M.D.   On: 10/13/2020 12:49   DG Shoulder Right Port  Result Date: 10/15/2020 CLINICAL DATA:  Postop right shoulder arthroplasty EXAM: PORTABLE RIGHT SHOULDER COMPARISON:  09/06/2020 FINDINGS: Internal rotation, external rotation, transscapular views of the right shoulder demonstrate right shoulder arthroplasty in the expected position without signs of acute complication. No acute fracture. Right chest is clear.  IMPRESSION: 1. Unremarkable right shoulder arthroplasty. Electronically Signed   By: Sharlet Salina M.D.   On: 10/15/2020 20:14   DG Tibia/Fibula Left Port  Result Date: 10/13/2020 CLINICAL DATA:  Distal tibial fracture status post fixation EXAM: PORTABLE LEFT TIBIA AND FIBULA - 2 VIEW COMPARISON:  Tibia and fibula intraoperative radiographs dated 10/13/2020. FINDINGS: An overlying splint obscures bony detail. A fixated comminuted distal tibial fracture with fixation plate and screws is in near anatomic alignment. A comminuted distal fibula fracture is in near anatomic alignment. There is no ankle joint dislocation. There is diffuse bony demineralization. IMPRESSION: Near anatomic alignment of the fixated distal tibial fracture and the comminuted distal fibula fracture. Electronically Signed   By: Romona Curls M.D.   On: 10/13/2020 16:11   DG C-Arm 1-60 Min  Result Date: 10/13/2020 CLINICAL DATA:  Open reduction internal fixation  of left tibial fracture. EXAM: DG C-ARM 1-60 MIN; LEFT TIBIA AND FIBULA - 2 VIEW FLUOROSCOPY TIME:  Fluoroscopy Time:  1 minutes and 5 seconds Number of Acquired Spot Images: 0 COMPARISON:  July 19, 2006 FINDINGS: Intraoperative fluoroscopic images from sideplate and screw fixation of comminuted impacted distal left tibial fracture demonstrate improved alignment and no evidence of immediate complications. There is an associated mildly comminuted distal fibular fracture. IMPRESSION: 1. Intraoperative fluoroscopic images from sideplate and screw fixation of comminuted impacted distal left tibial fracture. 2. Mildly comminuted distal fibular fracture. Electronically Signed   By: Ted Mcalpine M.D.   On: 10/13/2020 12:49    Microbiology: Recent Results (from the past 240 hour(s))  Respiratory Panel by RT PCR (Flu A&B, Covid) - Nasopharyngeal Swab     Status: None   Collection Time: 10/13/20  1:55 AM   Specimen: Nasopharyngeal Swab  Result Value Ref Range Status   SARS  Coronavirus 2 by RT PCR NEGATIVE NEGATIVE Final    Comment: (NOTE) SARS-CoV-2 target nucleic acids are NOT DETECTED.  The SARS-CoV-2 RNA is generally detectable in upper respiratoy specimens during the acute phase of infection. The lowest concentration of SARS-CoV-2 viral copies this assay can detect is 131 copies/mL. A negative result does not preclude SARS-Cov-2 infection and should not be used as the sole basis for treatment or other patient management decisions. A negative result may occur with  improper specimen collection/handling, submission of specimen other than nasopharyngeal swab, presence of viral mutation(s) within the areas targeted by this assay, and inadequate number of viral copies (<131 copies/mL). A negative result must be combined with clinical observations, patient history, and epidemiological information. The expected result is Negative.  Fact Sheet for Patients:  https://www.moore.com/  Fact Sheet for Healthcare Providers:  https://www.young.biz/  This test is no t yet approved or cleared by the [place] FDA and  has been authorized for detection and/or diagnosis of SARS-CoV-2 by FDA under an Emergency Use Authorization (EUA). This EUA will remain  in effect (meaning this test can be used) for the duration of the COVID-19 declaration under Section 564(b)(1) of the Act, 21 U.S.C. section 360bbb-3(b)(1), unless the authorization is terminated or revoked sooner.     Influenza A by PCR NEGATIVE NEGATIVE Final   Influenza B by PCR NEGATIVE NEGATIVE Final    Comment: (NOTE) The Xpert Xpress SARS-CoV-2/FLU/RSV assay is intended as an aid in  the diagnosis of influenza from Nasopharyngeal swab specimens and  should not be used as a sole basis for treatment. Nasal washings and  aspirates are unacceptable for Xpert Xpress SARS-CoV-2/FLU/RSV  testing.  Fact Sheet for  Patients: https://www.moore.com/  Fact Sheet for Healthcare Providers: https://www.young.biz/  This test is not yet approved or cleared by the [place] FDA and  has been authorized for detection and/or diagnosis of SARS-CoV-2 by  FDA under an Emergency Use Authorization (EUA). This EUA will remain  in effect (meaning this test can be used) for the duration of the  Covid-19 declaration under Section 564(b)(1) of the Act, 21  U.S.C. section 360bbb-3(b)(1), unless the authorization is  terminated or revoked. Performed at [facility], [address], [place], [place] [address]   MRSA PCR Screening     Status: None   Collection Time: 10/13/20  3:24 AM   Specimen: Nasal Mucosa; Nasopharyngeal  Result Value Ref Range Status   MRSA by PCR NEGATIVE NEGATIVE Final    Comment:        The  GeneXpert MRSA Assay (FDA approved for NASAL specimens only), is one component of a comprehensive MRSA colonization surveillance program. It is not intended to diagnose MRSA infection nor to guide or monitor treatment for MRSA infections. Performed at [facility], [address], [place], [place] [address]      Labs: CBC: Recent Labs  Lab 10/15/20 0825 10/16/20 0212 10/17/20 0139 10/18/20 0130 10/19/20 0408  WBC 9.8 11.4* 13.5* 13.6* 12.2*  HGB 11.3* 11.9* 10.5* 10.4* 10.7*  HCT 35.7* 36.3 32.7* 32.3* 33.0*  MCV 93.9 93.6 93.2 93.6 94.0  PLT 477* 523* 400 450* 447*   Basic Metabolic Panel: Recent Labs  Lab 10/15/20 0825 10/16/20 0212 10/17/20 0139 10/18/20 0130 10/19/20 0640  NA 140 137 134* 136 138  K 4.2 4.4 4.2 4.2 4.2  CL 107 103 101 102 103  CO2 GLUCOSE 89 159* 125* 113* 95  BUN CREATININE 0.63 0.57 0.60 0.57 0.56  CALCIUM 8.9 9.1 8.5* 8.7* 8.9   Liver Function Tests: No results for input(s): AST, ALT, ALKPHOS, BILITOT, PROT, ALBUMIN in the last 168 hours. CBG: Recent Labs  Lab  10/18/20 2037  GLUCAP 128*    Time spent: 35 minutes  Signed:  Lynden Oxford  [facility]  10/19/2020 10:30 AM

## 2020-10-19 NOTE — Plan of Care (Signed)
  Problem: Safety: Goal: Ability to remain free from injury will improve Outcome: Progressing   

## 2020-10-19 NOTE — Plan of Care (Signed)
Patient is alert and oriented x4. On celebrex and ice pack for right shoulder pain. In no acute distress. LLE dressing intact without drainage. Problem: Education: Goal: Knowledge of General Education information will improve Description: Including pain rating scale, medication(s)/side effects and non-pharmacologic comfort measures Outcome: Progressing   Problem: Health Behavior/Discharge Planning: Goal: Ability to manage health-related needs will improve Outcome: Progressing   Problem: Clinical Measurements: Goal: Ability to maintain clinical measurements within normal limits will improve Outcome: Progressing Goal: Will remain free from infection Outcome: Progressing Goal: Diagnostic test results will improve Outcome: Progressing Goal: Respiratory complications will improve Outcome: Progressing Goal: Cardiovascular complication will be avoided Outcome: Progressing   Problem: Activity: Goal: Risk for activity intolerance will decrease Outcome: Progressing   Problem: Nutrition: Goal: Adequate nutrition will be maintained Outcome: Progressing   Problem: Coping: Goal: Level of anxiety will decrease Outcome: Progressing   Problem: Elimination: Goal: Will not experience complications related to bowel motility Outcome: Progressing Goal: Will not experience complications related to urinary retention Outcome: Progressing   Problem: Pain Managment: Goal: General experience of comfort will improve Outcome: Progressing   Problem: Safety: Goal: Ability to remain free from injury will improve Outcome: Progressing   Problem: Skin Integrity: Goal: Risk for impaired skin integrity will decrease Outcome: Progressing

## 2020-10-19 NOTE — Care Management Important Message (Signed)
Important Message  Patient Details  Name: [name] MRN: [id number] Date of Birth: 03/27/1946   Medicare Important Message Given:  Yes     Elika Godar P Seaborn Nakama 10/19/2020, 1:47 PM

## 2020-10-19 NOTE — TOC Transition Note (Signed)
Transition of Care [facility]) - CM/SW Discharge Note   Patient Details  Name: [name] MRN: [id number] Date of Birth: 23-Feb-1946  [facility] CM/SW Contact:  Epifanio Lesches, RN Phone Number: 10/19/2020, 11:29 AM   Clinical Narrative:    Patient will DC to:  [facility] Anticipated DC date: 10/19/2020 Family notified: son Transport by: [facility]   Pt accepted bed offer extended by [facility]. Per MD patient ready for DC today. RN, patient, patient's son, and facility notified of DC. Discharge Summary and FL2 sent to facility. RN to call report prior to discharge ( [phone]). DC packet on chart. Ambulance transport requested for patient.   Brisia Schuermann (Son)     [phone]       RNCM will sign off for now as intervention is no longer needed. Please consult [place] again if new needs arise.    Final next level of care: Skilled Nursing Facility Barriers to Discharge: No Barriers Identified   Patient Goals and CMS Choice Patient states their goals for this hospitalization and ongoing recovery are:: To get better CMS Medicare.gov Compare Post Acute Care list provided to:: Patient    Discharge Placement                       Discharge Plan and Services   Discharge Planning Services: CM Consult                                 Social Determinants of Health (SDOH) Interventions     Readmission Risk Interventions No flowsheet data found.

## 2020-11-14 ENCOUNTER — Telehealth: Payer: Self-pay | Admitting: Neurology

## 2020-11-14 NOTE — Telephone Encounter (Signed)
Pt's son [name] on [place] called and LVM stating that the pt' legs have been having spasms and also has been nauseated and not being able to eat for the past several days. Please call son back when available.

## 2020-11-14 NOTE — Telephone Encounter (Signed)
Called and spoke with son. He reports she is having increased leg spasms that started 1-1.5 months ago. Got J&J covid-19 vaccine around 8 weeks ago. She has had no appetite starting about 4-5 days after getting vaccine.  She had sprained ankle, placed in boot. Boot caught in [facility], broke both bones above ankle. Got pins/rods placed. While at hospital, they also did total shoulder replacement. This was 2-3 weeks ago. She has seen [facility] surgeon for follow up on ankle/shoulder. Has appts this month for both again. She is currently at [facility] living. Losing a lot of weight, at around 100lb now per son. They are trying to get her to drink ensure. She has been sick, throwing up. Having diarrhea the last two days.  NP saw her today at facility. Talking about doing xray of her stomach for further work up. Recommended he see if they can do labs/UA/test for covid-19/flu. He should also let PCP know. Did advise if she has any infection, this can cause MS sx to be worse. Advised I will send message to Dr. Epimenio Foot to see if he recommends anything neurologically. Advised I will call back tomorrow at the latest. He said it is ok to LVM tomorrow. He works in [employer] and it is loud. He will call back.

## 2020-11-14 NOTE — Telephone Encounter (Signed)
We could start baclofen 10 mg p.o. three times  daily (take just once a day for the first few days then twice a day for few days)  #90  #5

## 2020-11-15 ENCOUNTER — Encounter: Payer: Self-pay | Admitting: *Deleted

## 2020-11-15 NOTE — Telephone Encounter (Signed)
Called son back. Relayed Dr. Bonnita Hollow recommendation. He states this is what another physician had placed her on and she could not pee for 24 hr and started throwing up. He could not remember the name of this medication yesterday but confirmed it was Baclofen. She has since stopped this. He confirmed they are doing xray abdomen and labs. Unsure when. He requested I call [facility] to get further info on this. Their phone number: [phone]. Advised I will call and then call back with another recommendation once I speak with MD.

## 2020-11-15 NOTE — Telephone Encounter (Signed)
Let us try methocarbamol 500 mg p.o. 3 times daily.  It is not as strong as baclofen so hopefully she will be able to tolerate this

## 2020-11-15 NOTE — Telephone Encounter (Signed)
Called [facility]. Asked to speak with nurse. Spoke with [name]. She received one dose baclofen 5mg . Had N/V and diarrhea after. NP saw her yesterday. Ordered KUB study and labs. She mentioned pt is feeling better today. They are also going to check for CDIFF. f Dr. wants to make any med changes, Fax: [phone], attn: Nurse.

## 2020-11-15 NOTE — Telephone Encounter (Signed)
Wrote up order, waiting on MD signature and then will fax. Called son and provided him update. He verbalized understanding.

## 2020-11-15 NOTE — Telephone Encounter (Signed)
Took call from phone staff and spoke with Otelia Santee at [facility]. She states they are trying to place order for methocarbamol. Flagging since they have order for baclofen. Advised this should be d/c'd. We were informed pt had SE and she stopped this. She will contact NP who originally ordered to d/c this.

## 2020-11-15 NOTE — Telephone Encounter (Signed)
Faxed completed/signed order for methocarbamol to [phone]. Received fax confirmation.

## 2020-11-30 IMAGING — MR MR HEAD W/O CM
9 of 12 series · 30 of 48 positions shown · non-contrast
Comparison: CT head 11/26/2019.  No prior MRI for comparison.

CLINICAL DATA: Focal neurologic deficit. Suspect stroke. History of
multiple sclerosis.

EXAM:
MRI HEAD WITHOUT CONTRAST
TECHNIQUE: Multiplanar, multiecho pulse sequences of the brain and surrounding
structures were obtained without intravenous contrast.

[Series 2: DWI · axial · 3.0mm · 0.94mm/px · z∈[-75,+78]mm · 7 of 104 slices shown (1 of 2)]
[im 1/104]
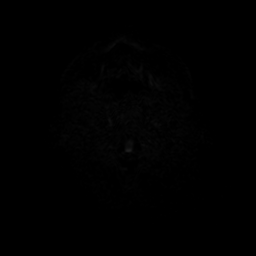
[im 18/104]
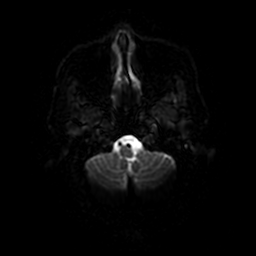
[im 35/104]
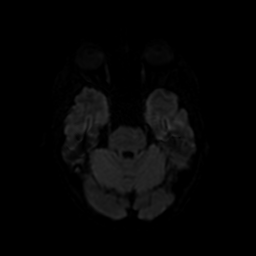
[im 52/104]
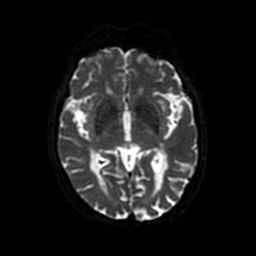
[im 69/104]
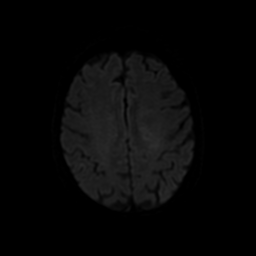
[im 86/104]
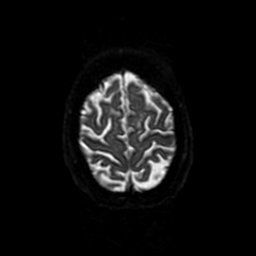
[im 104/104]
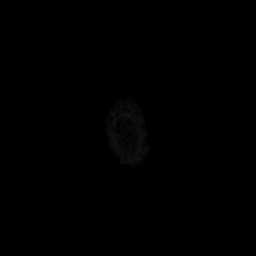

[Series 3: DWI · coronal · 4.0mm · 0.94mm/px · 4 of 72 slices shown (2 of 2)]
[im 1/72]
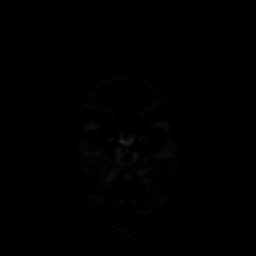
[im 24/72]
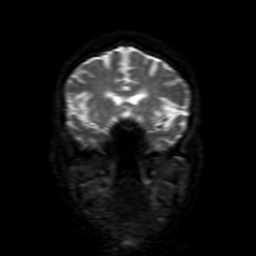
[im 48/72]
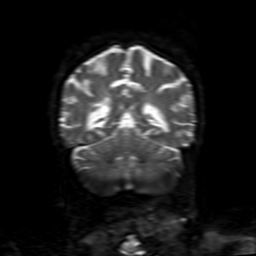
[im 72/72]
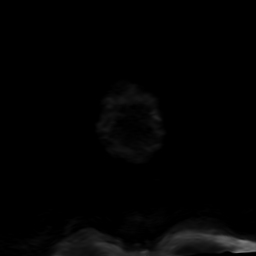

[Series 4: FLAIR · axial · 3.0mm · 0.45mm/px · 1 of 25 slices shown (1 of 3)]
[im 1/25]
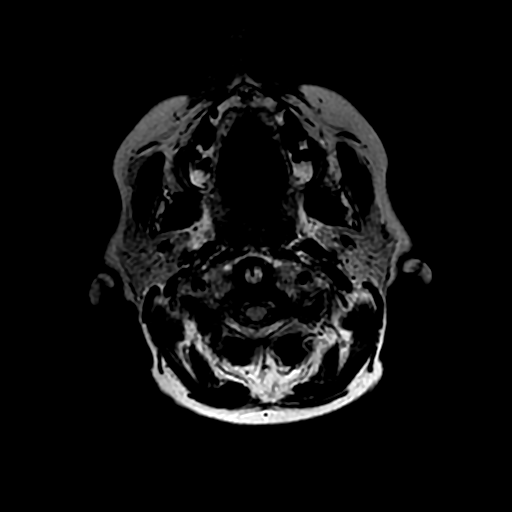

[Series 6: FLAIR · sagittal · 5.0mm · 0.47mm/px · 1 of 25 slices shown (2 of 3)]
[im 1/25]
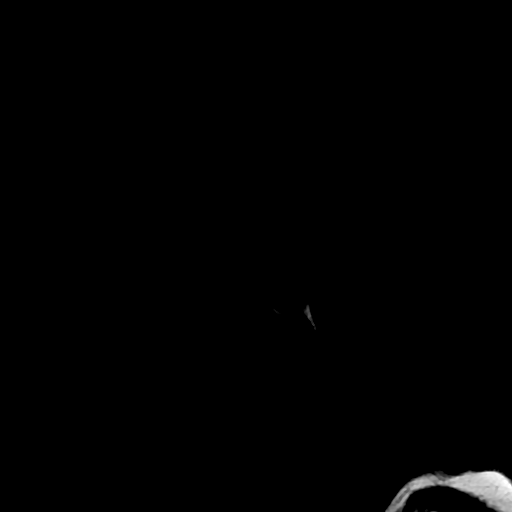

[Series 7: T2 · axial · 5.0mm · 0.47mm/px · 1 of 25 slices shown (1 of 2)]
[im 1/25]
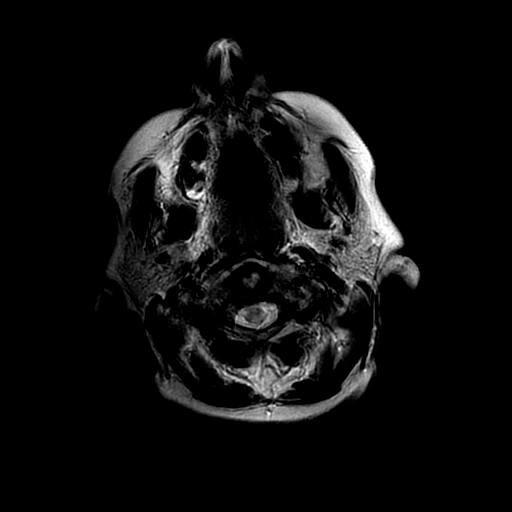

[Series 10: FLAIR · sagittal · 1.4mm · 0.50mm/px · 9 of 216 slices shown (3 of 3)]
[im 1/216]
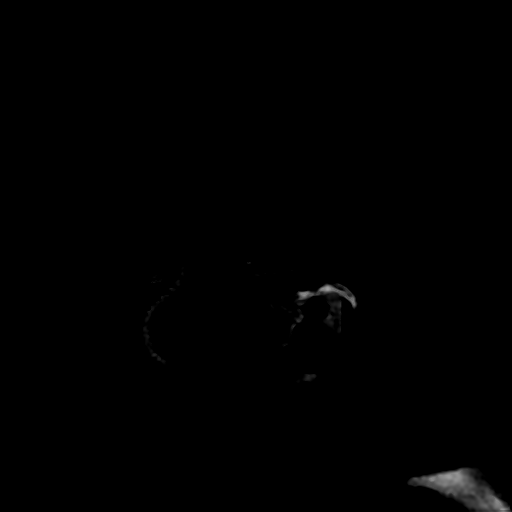
[im 40/216]
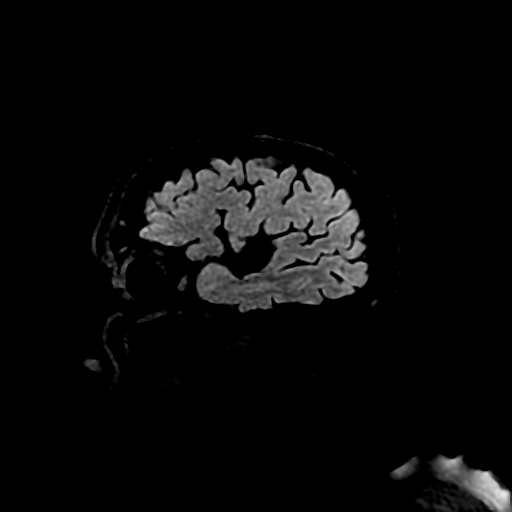
[im 59/216]
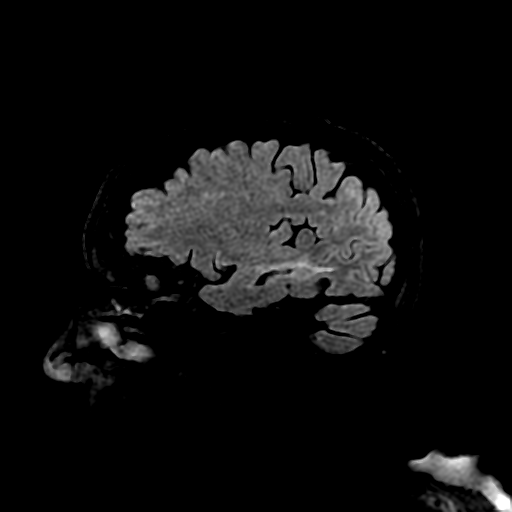
[im 98/216]
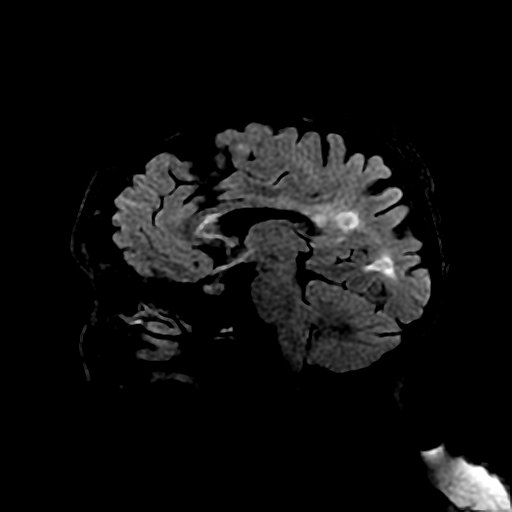
[im 118/216]
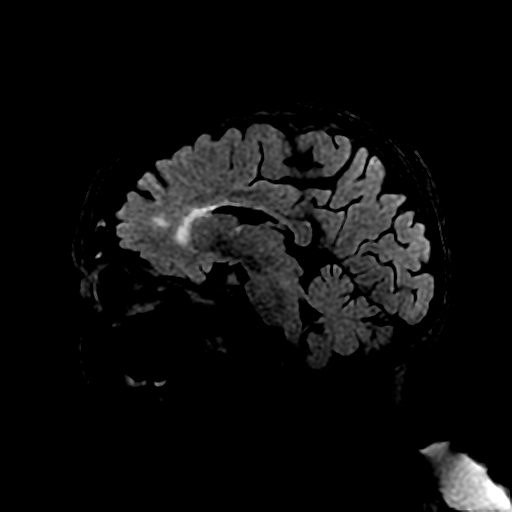
[im 157/216]
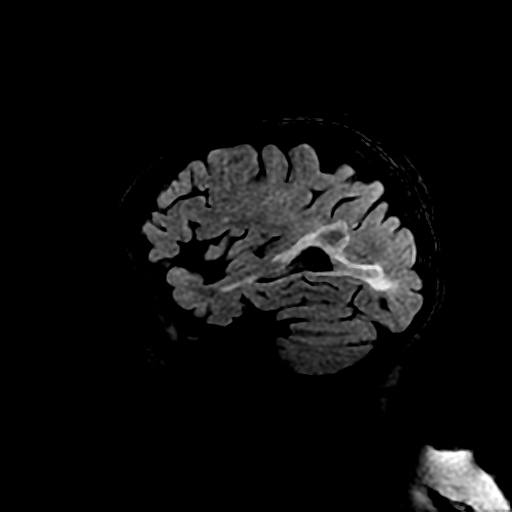
[im 176/216]
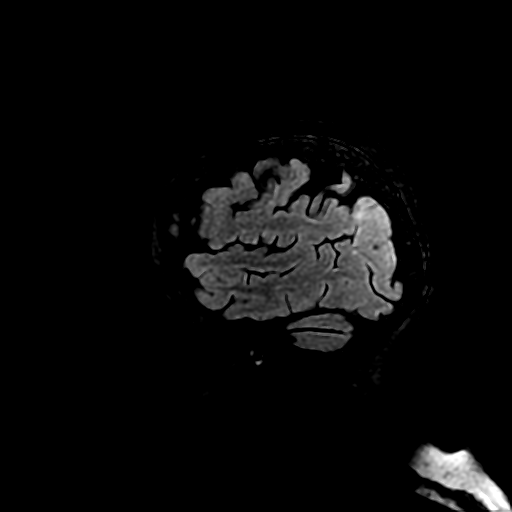
[im 196/216]
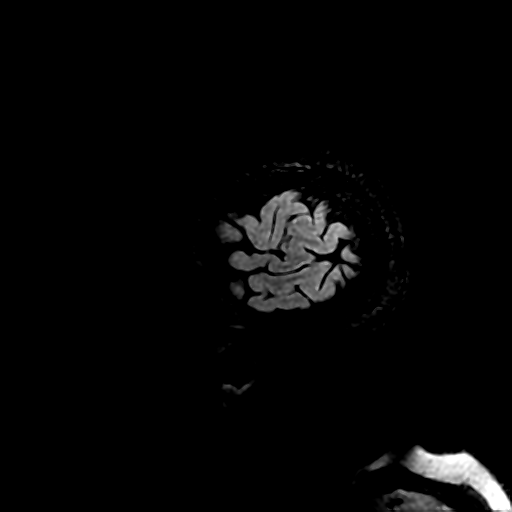
[im 216/216]
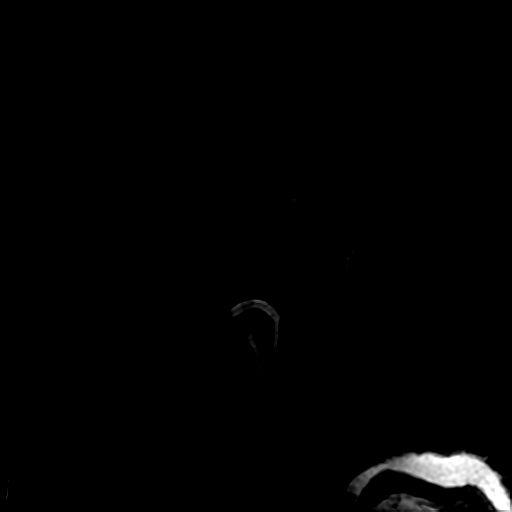

[Series 11: T2 · coronal · 5.0mm · 0.39mm/px · 2 of 30 slices shown (2 of 2)]
[im 1/30]
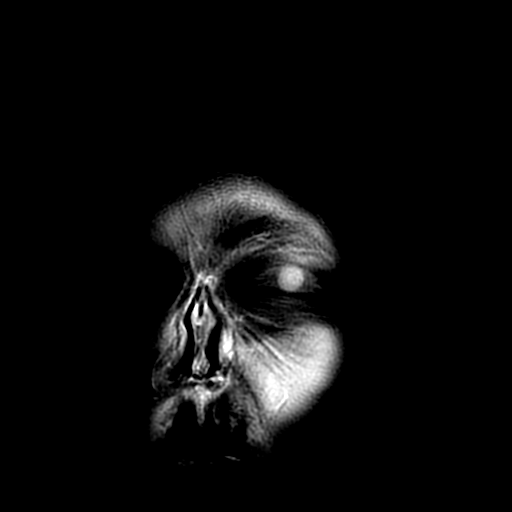
[im 30/30]
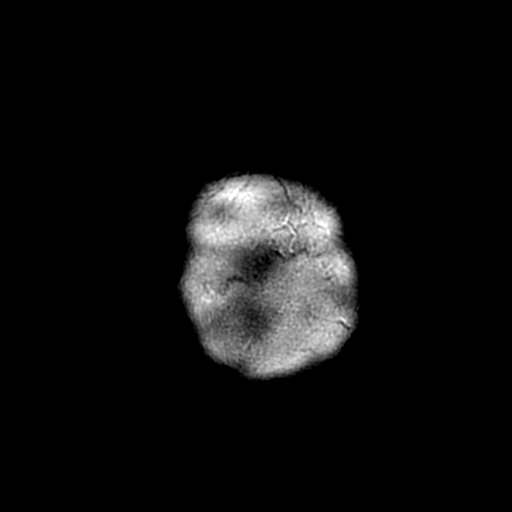

[Series 250: ADC · axial · 3.0mm · 0.94mm/px · z∈[-75,+78]mm · 3 of 51 slices shown (1 of 2)]
[im 1/51]
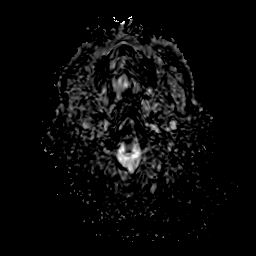
[im 26/51]
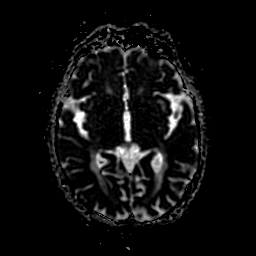
[im 51/51]
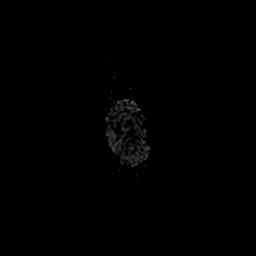

[Series 350: ADC · coronal · 4.0mm · 0.94mm/px · 2 of 36 slices shown (2 of 2)]
[im 1/36]
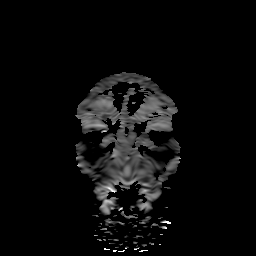
[im 36/36]
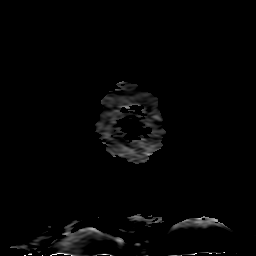

[30 of 48 positions shown; findings below may reference images not displayed]

FINDINGS: Brain: Negative for acute infarct.

Numerous periventricular white matter hyperintensities. Pattern
consistent with multiple sclerosis. Hyperintensities extend into the
deep white matter in the parietal lobe bilaterally. Brainstem and
cerebellum intact.

Cerebral volume mildly diminished but normal for age. Negative for
hydrocephalus. Negative for hemorrhage or mass.

Vascular: Normal arterial flow voids

Skull and upper cervical spine: Negative

Sinuses/Orbits: Negative

Other: None
IMPRESSION: Negative for acute infarct

Periventricular deep white matter hyperintensities compatible with
multiple sclerosis. No prior MRI for comparison purposes.

## 2020-11-30 IMAGING — CT CT HEAD W/O CM
3 of 4 series · 15 of 47 positions shown, 18 images · non-contrast
Comparison: None.

CLINICAL DATA: Dizziness and weakness for 3 weeks

EXAM:
CT HEAD WITHOUT CONTRAST
TECHNIQUE: Contiguous axial images were obtained from the base of the skull
through the vertex without intravenous contrast.

[Series 4: head 2.0 h70h · axial · 0.44mm/px · z∈[-90,+50]mm · 9 of 88 slices shown, 12 images]
[im 9/88  brain]
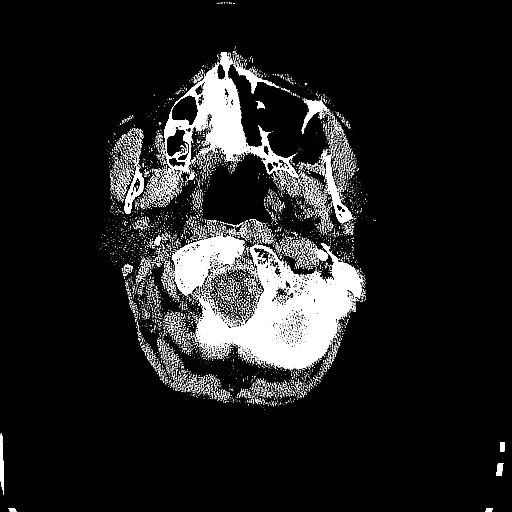
[im 9/88  bone]
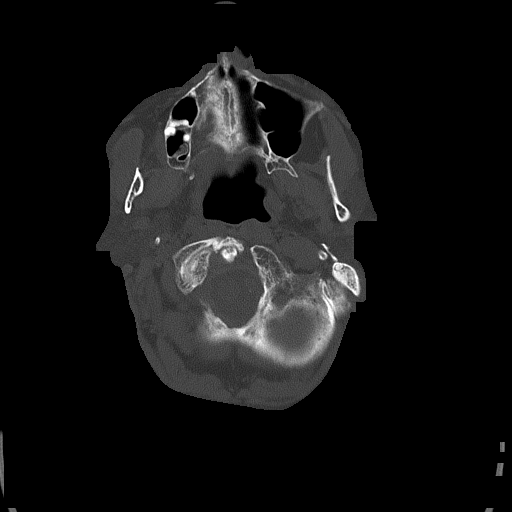
[im 17/88  brain]
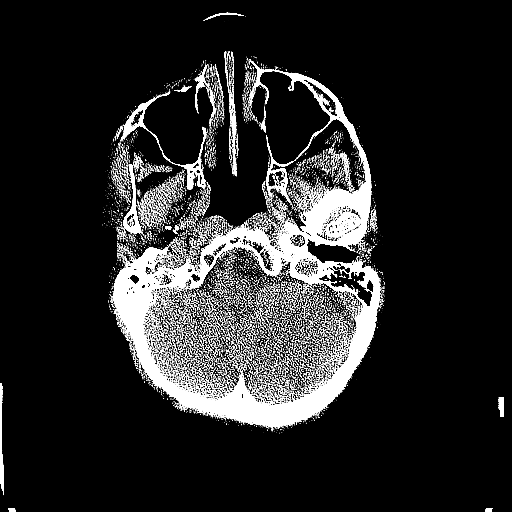
[im 25/88  brain]
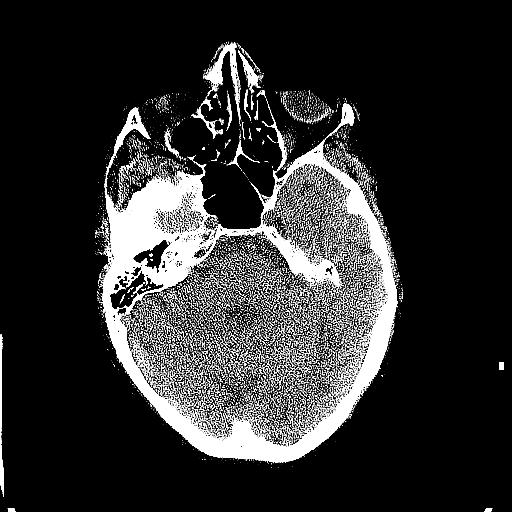
[im 34/88  brain]
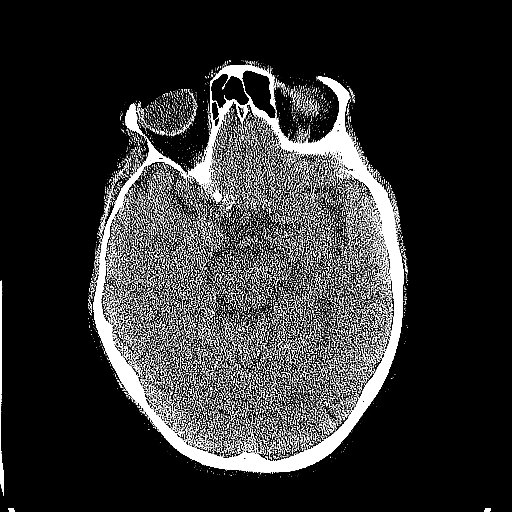
[im 46/88  brain]
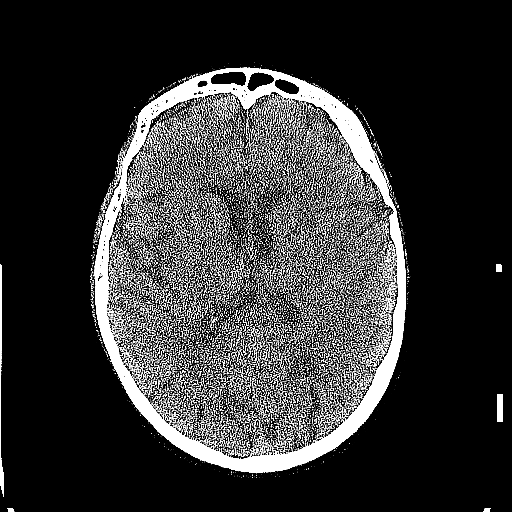
[im 46/88  bone]
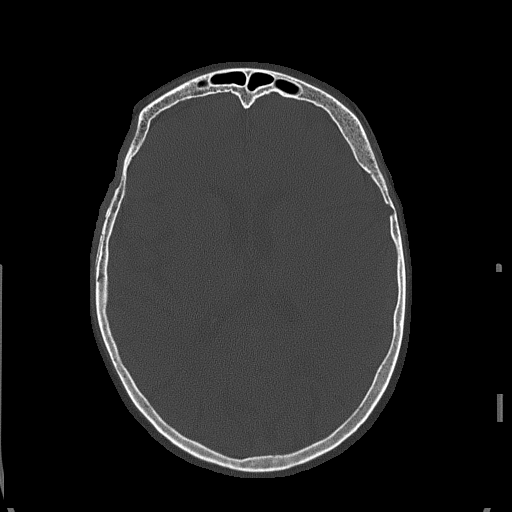
[im 54/88  brain]
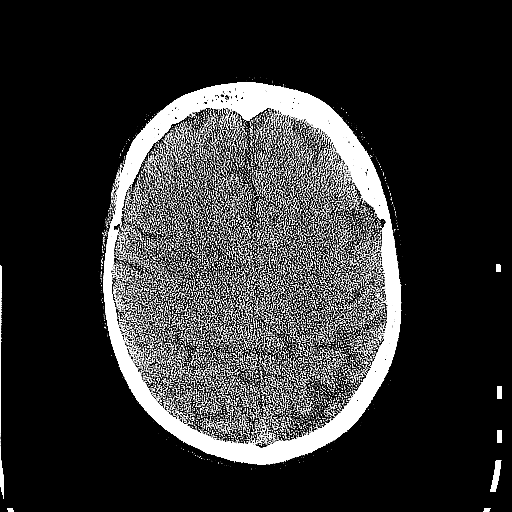
[im 63/88  brain]
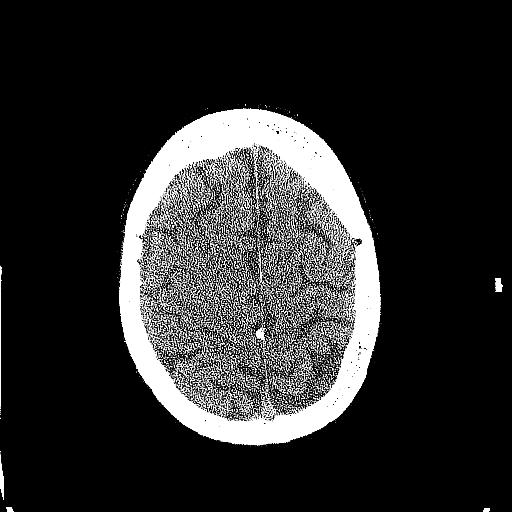
[im 71/88  brain]
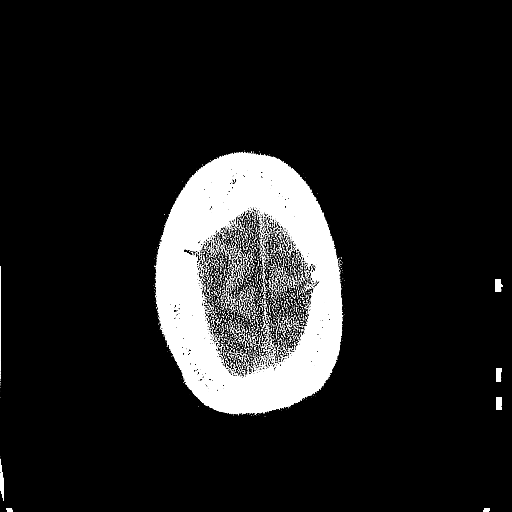
[im 79/88  brain]
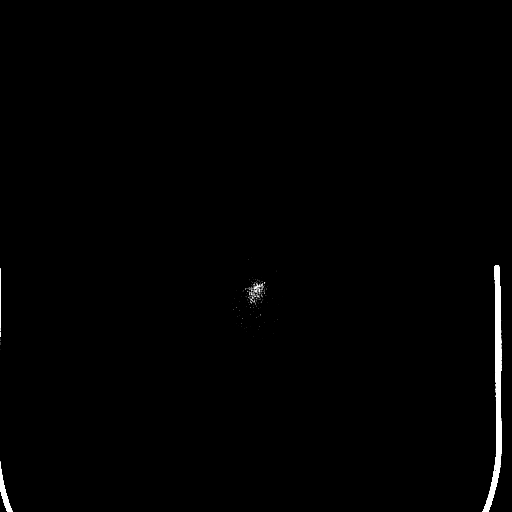
[im 79/88  bone]
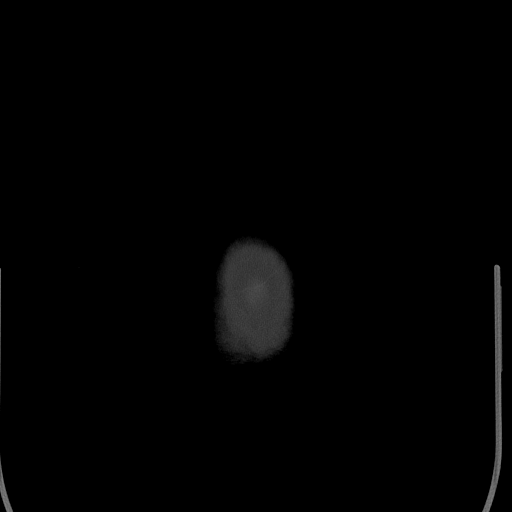

[Series 5: head 3.0 mpr cor · coronal · 0.33mm/px · 3 of 67 slices shown]
[im 23/67  brain]
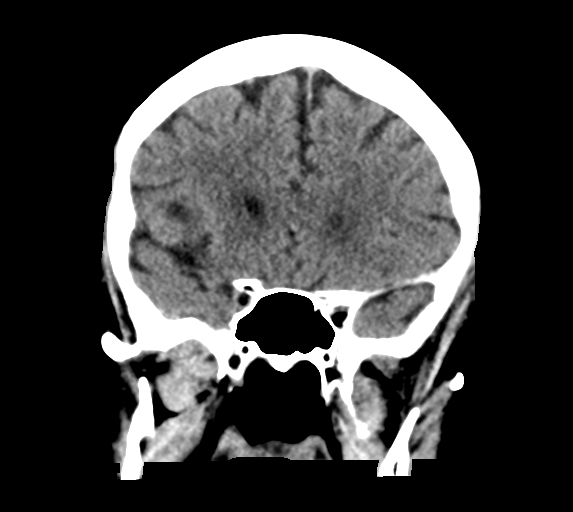
[im 30/67  brain]
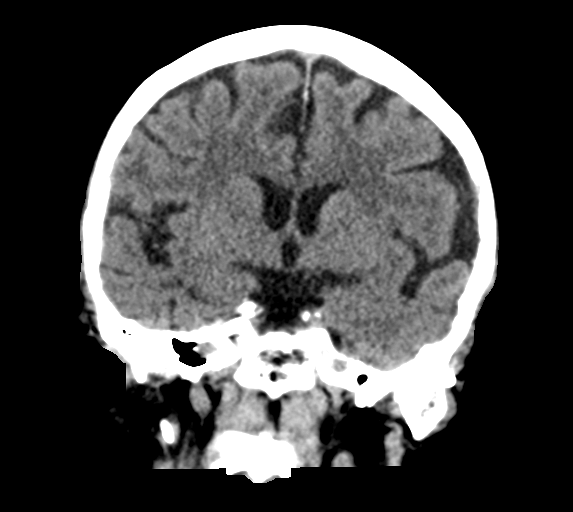
[im 37/67  brain]
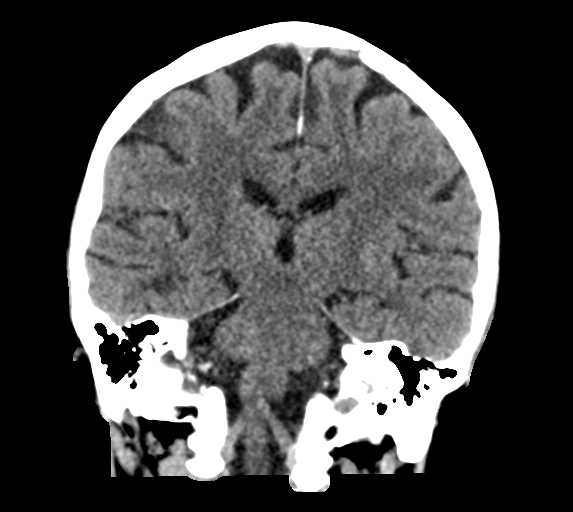

[Series 6: head 3.0 mpr sag · sagittal · 0.34mm/px · 3 of 64 slices shown]
[im 22/64  brain]
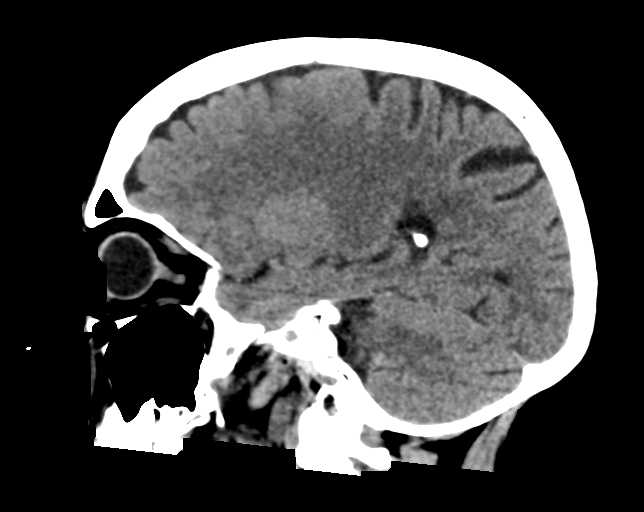
[im 32/64  brain]
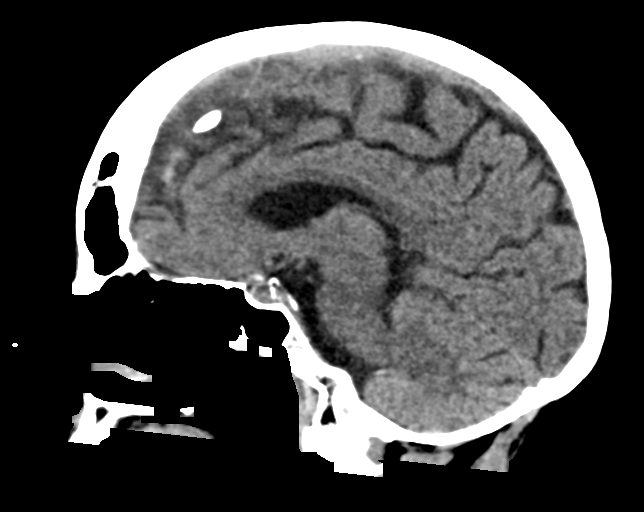
[im 43/64  brain]
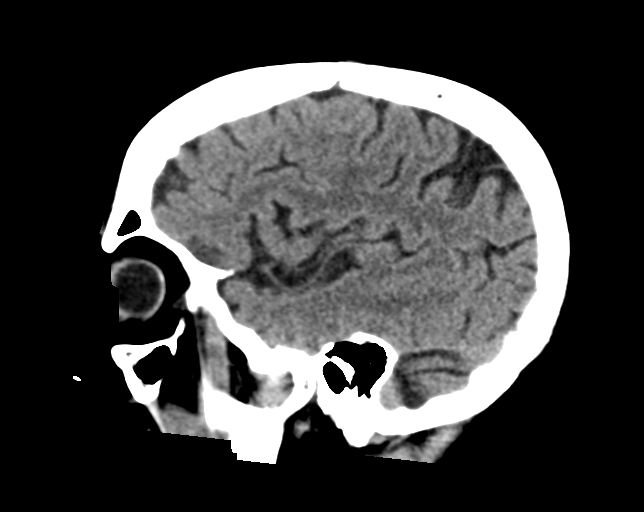

[15 of 47 positions shown; findings below may reference images not displayed]

FINDINGS: Brain: Brain atrophy noted with white matter microvascular ischemic
changes about the lateral ventricle throughout both cerebral
hemispheres. No acute intracranial hemorrhage definite new
infarction mass lesion, midline shift, herniation, hydrocephalus,
extra-axial fluid collection. No focal mass effect or edema.
Cisterns are patent. No cerebellar abnormality.

Vascular: Intracranial atherosclerosis noted.  No hyperdense vessel.

Skull: Normal. Negative for fracture or focal lesion.

Sinuses/Orbits: No acute finding.

Other: None.
IMPRESSION: Brain atrophy and chronic white matter microvascular changes. No
acute intracranial abnormality by noncontrast CT.

## 2020-12-21 ENCOUNTER — Other Ambulatory Visit (HOSPITAL_COMMUNITY): Payer: Self-pay | Admitting: Neurosurgery

## 2020-12-21 ENCOUNTER — Other Ambulatory Visit: Payer: Self-pay | Admitting: Neurosurgery

## 2020-12-21 DIAGNOSIS — M4712 Other spondylosis with myelopathy, cervical region: Secondary | ICD-10-CM

## 2020-12-21 DIAGNOSIS — R29898 Other symptoms and signs involving the musculoskeletal system: Secondary | ICD-10-CM

## 2020-12-21 DIAGNOSIS — G959 Disease of spinal cord, unspecified: Secondary | ICD-10-CM

## 2020-12-21 DIAGNOSIS — G35 Multiple sclerosis: Secondary | ICD-10-CM

## 2021-01-08 ENCOUNTER — Ambulatory Visit (HOSPITAL_COMMUNITY): Payer: Medicare Other

## 2021-01-08 ENCOUNTER — Other Ambulatory Visit (HOSPITAL_COMMUNITY): Payer: Self-pay | Admitting: Neurosurgery

## 2021-01-08 ENCOUNTER — Ambulatory Visit (HOSPITAL_COMMUNITY)
Admission: RE | Admit: 2021-01-08 | Discharge: 2021-01-08 | Disposition: A | Discharge status: Home / Self Care | Payer: Medicare Other | Source: Ambulatory Visit | Attending: Neurosurgery | Admitting: Neurosurgery

## 2021-01-08 ENCOUNTER — Encounter (HOSPITAL_COMMUNITY): Payer: Self-pay

## 2021-01-08 ENCOUNTER — Ambulatory Visit (HOSPITAL_COMMUNITY): Admission: RE | Admit: 2021-01-08 | Payer: Medicare Other | Source: Ambulatory Visit

## 2021-01-08 ENCOUNTER — Other Ambulatory Visit: Payer: Self-pay

## 2021-01-08 DIAGNOSIS — M4712 Other spondylosis with myelopathy, cervical region: Secondary | ICD-10-CM | POA: Diagnosis present

## 2021-01-08 DIAGNOSIS — G959 Disease of spinal cord, unspecified: Secondary | ICD-10-CM | POA: Insufficient documentation

## 2021-01-08 DIAGNOSIS — R29898 Other symptoms and signs involving the musculoskeletal system: Secondary | ICD-10-CM | POA: Insufficient documentation

## 2021-01-08 DIAGNOSIS — G35 Multiple sclerosis: Secondary | ICD-10-CM | POA: Insufficient documentation

## 2021-01-28 ENCOUNTER — Telehealth: Payer: Self-pay | Admitting: Neurology

## 2021-01-28 NOTE — Telephone Encounter (Signed)
Pt. Called today stating that she is in terrible pain and she is needing an appt ASAP. She said we should call her son [name] to set up the appt. Best call back is [phone]

## 2021-01-28 NOTE — Telephone Encounter (Signed)
Jillian, do you mind reaching out to son to offer appt this Wed at 9am with Dr. Epimenio Foot? Thank you

## 2021-01-30 ENCOUNTER — Encounter: Payer: Self-pay | Admitting: Neurology

## 2021-01-30 ENCOUNTER — Ambulatory Visit (INDEPENDENT_AMBULATORY_CARE_PROVIDER_SITE_OTHER): Payer: Medicare Other | Admitting: Neurology

## 2021-01-30 ENCOUNTER — Telehealth: Payer: Self-pay | Admitting: Neurology

## 2021-01-30 VITALS — BP 110/60 | HR 71 | Ht 63.0 in

## 2021-01-30 DIAGNOSIS — N319 Neuromuscular dysfunction of bladder, unspecified: Secondary | ICD-10-CM | POA: Diagnosis not present

## 2021-01-30 DIAGNOSIS — G801 Spastic diplegic cerebral palsy: Secondary | ICD-10-CM | POA: Insufficient documentation

## 2021-01-30 DIAGNOSIS — R252 Cramp and spasm: Secondary | ICD-10-CM | POA: Diagnosis not present

## 2021-01-30 DIAGNOSIS — G35 Multiple sclerosis: Secondary | ICD-10-CM | POA: Diagnosis not present

## 2021-01-30 NOTE — Telephone Encounter (Addendum)
Dr. Epimenio Foot injected patient with 400 units of Xeomin ([id number]) today for G82.20 and G35. CPT codes 10071, [id number], [phone]. He would like patient to come back for possible repeat injection in 3 months. I called the patient's son but was not able to reach him or LVM.

## 2021-01-30 NOTE — Progress Notes (Signed)
[facility]  PATIENT: [name] DOB: 1946/09/06  REFERRING DOCTOR OR PCP: Tillman Abide MD SOURCE: Patient, notes from primary care, notes from neurology 2014 and recent hospital admission, imaging and laboratory reports, MRI images personally reviewed.  _________________________________   HISTORICAL  CHIEF COMPLAINT:  Chief Complaint  Patient presents with  . Follow-up    RM 13, with son, [name]. Last seen 07/03/20. Not on DMT for MS. Pt at [facility]. Transported to appt. In [place] chair today. Reports being in a lot of pain. Unable to straighten out legs. Very spastic. Has boot on left foot.    HISTORY OF PRESENT ILLNESS:   Update 01/30/2021: Since the last visit, she is experiencing more pain and spasticity.  She notes the spasticity in her legs.   They jerk a lot.  She went to a Nursing Home after left leg surgery for a fracture and the legs were raised over a pillow x weeks.  Now she notes the leg spasticity worsened and she can't straighten her legs now.   The right leg is also contracted but less than her left.   Trying to straighten the leg is painful and her tendons are very tight.       She also has a lot of back pain    Pain is worse when laying flat.   She had lumbar surgery in the past.     She is noting a little more difficulty controlling her bladder.   She has had more incontinence.  She reports urgency more than hesitancy.  She feels this has worsened since her hospital stay in December.  Of note, while in the hospital she used pads and was doing better with her bladder before.  Before the hospital stay, while at home, she was using the bathroom exclusively.  She felt she lost a little but of ground with her strength during that hospitalization, despite some PT.   She is reporting pain and swelling in her right leg.  A venous doppler 06/13/20 was negative.   She reports more swelling last month but more pain currently.    She is in a  wheelchair but can transfer independently.  She has been wheelchair bound 10 years.  She had a hospital admission last month.  Multiple MRIs were performed and these were personally reviewed.  Results below.  MS History:    She was diagnosed with MS in 1982 after presenting with right arm numbness.   In retrospect, she reports that after the swine flu vaccination in the 1970's she had trouble with her walking.   She had a myelogram and reports her gait was worse afterwards.  She needed to hold on for balance since 1982.   She started to use a walker in 1995 part time and most of the time around 2005.   She has been wheelchair-bound since 2010.Marland Kitchen She is able to transfer from the wheelchair to the toilet but cannot bear much weight.   She was seeing Dr. Sandria Manly before 2014 and saw Dr. Marjory Lies once in 2014.Marland Kitchen    She was never on a DMT.    She has not had any definite exacerbation in the last 15 years.  However, she does feel that she has slowly worsened.    She had fractures of the right ankle in 2006, left ankle in 2007 and thoracic spine in 2011, She had vertebroplasty or kyphoplasty.    With each one, she had permanent worsening.    She  had vertigo November or December 2020.  When vertigo worsened she went to the ED.  Her son notes that she had nystagmus when the vertigo spells occured.  The some of the episodes occurred associated with position change.   MRI showed white matter changes c/w MS and she had 2 small hemispheric enhancing lesion.    She was admitted and received IV Solumedrol.   She also received PT and vestibular therapy.  She notes that after one of the sessions of vestibular therapy that the vertigo markedly improved.  The MRI was repeated in June 2021.  She did not appear to have any new lesions (though that MRI was done without contrast)    MRI Images: MRI of the brain 11/26/2019 showed multiple T2/FLAIR hyperintense foci in the periventricular, juxtacortical and deep white matter.  The  infratentorial white matter appears normal.  There were 2 small enhancing lesions, both in the periventricular white matter (these small lesions would not be expected to cause vertigo or any other symptoms.).  I also reviewed the MRI of the cervical spine from May 07, 2016.  It shows multiple single and confluent T2 hyperintense foci within the spinal cord.  Some of the foci are peripherally located, consistent with multiple sclerosis lesions.  MRI of the brain was repeated June 2021.  There were no new lesions.  MRI of the brain 01/08/2021 showed multiple T2/FLAIR hyperintense foci in the hemispheres consistent with MS.  There were no acute findings.  No change compared to the 06/03/2020 MRI.  MRI of the cervical spine 01/08/2021 showed patchy T2 hyperintense foci within the cervical spine.  Due to movement artifact, comparison with previous MRIs is difficult but no definite new lesions.  Multilevel degenerative changes that are most progressed at C6-C7 with disc protrusion causes mild spinal stenosis.  Foraminal narrowing was noted from C3-C7, left greater than right.  MRI of the thoracic spine 01/08/2021 showed some patchy T2 hyperintense signal in the upper thoracic spinal cord.  Movement artifact affects interpretation.  Disc protrusions at T7-T8 and T8-T9.  MRI of the lumbar spine shows multilevel degenerative changes most pronounced at L4-L5 (sacralized L5) with moderate right and severe left foraminal narrowing   REVIEW OF SYSTEMS: Constitutional: No fevers, chills, sweats, or change in appetite Eyes: She notes visual changes.  She has a dysconjugate gaze but denies diplopia Ear, nose and throat: No hearing loss, ear pain, nasal congestion, sore throat Cardiovascular: No chest pain, palpitations Respiratory: No shortness of breath at rest or with exertion.   No wheezes GastrointestinaI: No nausea, vomiting, diarrhea, abdominal pain, fecal incontinence Genitourinary:Urinary  urgency. Musculoskeletal: No neck pain, back pain Integumentary: No rash, pruritus, skin lesions Neurological: as above Psychiatric: No depression at this time.  No anxiety Endocrine: No palpitations, diaphoresis, change in appetite, change in weigh or increased thirst Hematologic/Lymphatic: No anemia, purpura, petechiae. Allergic/Immunologic: No itchy/runny eyes, nasal congestion, recent allergic reactions, rashes  ALLERGIES: Allergies  Allergen Reactions  . Baclofen Diarrhea and Nausea And Vomiting  . Lidocaine     rash  . Procaine Hcl     REACTION: UNSPECIFIED    HOME MEDICATIONS:  Current Outpatient Medications:  .  diclofenac Sodium (VOLTAREN) 1 % GEL, Apply 2 g topically in the morning, at noon, and at bedtime. Apply to right shoulder, Disp: , Rfl:  .  docusate sodium (COLACE) 100 MG capsule, Take 1 capsule (100 mg total) by mouth 2 (two) times daily. (Patient taking differently: Take 100 mg by mouth daily.),  Disp: 10 capsule, Rfl: 0 .  methenamine (HIPREX) 1 g tablet, Take 1 tablet (1 g total) by mouth every other day., Disp: 30 tablet, Rfl: 0 .  methocarbamol (ROBAXIN) 750 MG tablet, Take 750 mg by mouth 3 (three) times daily., Disp: , Rfl:  .  ondansetron (ZOFRAN-ODT) 4 MG disintegrating tablet, Take 4 mg by mouth every 8 (eight) hours as needed for nausea or vomiting., Disp: , Rfl:  .  oxyCODONE (OXY IR/ROXICODONE) 5 MG immediate release tablet, Take 0.5 - 1 pill every 6 hrs as needed for pain, no more than 6 pills daily, Disp: 20 tablet, Rfl: 0 .  polyethylene glycol (MIRALAX / GLYCOLAX) 17 g packet, Take 17 g by mouth daily. (Patient taking differently: Take 17 g by mouth daily as needed.), Disp: 14 each, Rfl: 0 .  Vitamin D, Ergocalciferol, (DRISDOL) 1.25 MG (50000 UNIT) CAPS capsule, Take 1 capsule (50,000 Units total) by mouth every 7 (seven) days., Disp: 5 capsule, Rfl: 0  PAST MEDICAL HISTORY: Past Medical History:  Diagnosis Date  . Ankle fracture, left 07/2006   . Ankle fracture, right 10/30/06   [facility]  . Compression fracture 2011   Dr. Dion Saucier   . Disc herniation    L5, S1  . MS (multiple sclerosis) (HCC)    evaluated by Dr. Sandria Manly  . MVP (mitral valve prolapse) 1979   ECHO. Trace MR, trace TR, trace PR 07/17/03  . Nephrolithiasis   . Osteoporosis   . Rib fractures 1998, [id number]   2  . Toe fracture 1998   5    PAST SURGICAL HISTORY: Past Surgical History:  Procedure Laterality Date  . LUMBAR DISC SURGERY  04/2007   L5, S1- [facility]  . OPEN REDUCTION INTERNAL FIXATION (ORIF) TIBIA/FIBULA FRACTURE Left 10/13/2020   Procedure: OPEN REDUCTION INTERNAL FIXATION (ORIF) TIBIA/FIBULA FRACTURE;  Surgeon: Roby Lofts, MD;  Location: [facility] OR;  Service: Orthopedics;  Laterality: Left;  . REVERSE SHOULDER ARTHROPLASTY Right 10/15/2020   Procedure: REVERSE SHOULDER ARTHROPLASTY;  Surgeon: Bjorn Pippin, MD;  Location: [facility] OR;  Service: Orthopedics;  Laterality: Right;  . SEPTOPLASTY  1983   deviated septum  . TONSILLECTOMY AND ADENOIDECTOMY  1960  . TOTAL ABDOMINAL HYSTERECTOMY W/ BILATERAL SALPINGOOPHORECTOMY  1982   for endometriosis    FAMILY HISTORY: Family History  Problem Relation Age of Onset  . Stroke Mother   . Neuropathy Brother   . Stroke Father   . Hypertension Neg Hx   . Diabetes Neg Hx   . Depression Neg Hx   . Alcohol abuse Neg Hx   . Drug abuse Neg Hx     SOCIAL HISTORY:  Social History   Socioeconomic History  . Marital status: Widowed    Spouse name: Not on file  . Number of children: 2  . Years of education: HS  . Highest education level: Not on file  Occupational History  . Occupation: Secretarial work---disabled  Tobacco Use  . Smoking status: Never Smoker  . Smokeless tobacco: Never Used  Substance and Sexual Activity  . Alcohol use: No  . Drug use: No  . Sexual activity: Not on file  Other Topics Concern  . Not on file  Social History Narrative   Widowed 1991.  Lives alone.    Played piano.       No  living will   Son [name] should make decisions for her prn   Would accept resuscitation attempts   No tube feeds if cognitively unaware  Caffeine ZOX:WRUEAVuse:drinks decaf   Right handed    Social Determinants of Health   Financial Resource Strain: Not on file  Food Insecurity: Not on file  Transportation Needs: Not on file  Physical Activity: Not on file  Stress: Not on file  Social Connections: Not on file  Intimate Partner Violence: Not on file     PHYSICAL EXAM  Vitals:   01/30/21 0818  BP: 110/60  Pulse: 71  SpO2: 97%  Height: 5\' 3"  (1.6 m)    Body mass index is 20.03 kg/m.   General: The patient is well-developed and well-nourished and in no acute distress.  She is in a Scientist, water qualityGeri chair.  HEENT:  Head is [place]/AT.  Sclera are anicteric.     Skin: Extremities are without rash.  She has pedal edema, right greater than left.  She has tenderness in both legs to deep palpation.    Neurologic Exam  Mental status: The patient is alert and oriented x 3 at the time of the examination. The patient has apparent normal recent and remote memory, with an apparently normal attention span and concentration ability.   Speech is normal.  Cranial nerves: She has a disconjugate gaze but no nystagmus.   Facial symmetry is present. There is good facial sensation to soft touch bilaterally.Facial strength is normal.  Trapezius and sternocleidomastoid strength is normal. No dysarthria is noted.  No obvious hearing deficits are noted.  Motor:  Muscle bulk seems normal.  She has greatly increased muscle tone in the legs, left greater than right.  She has contracture in a flexed position at the knee and to a lesser extent plantar flexed at the left left ankle.  She has similar but milder findings on the right.  No significant increased muscle tone in the arms.  Strength is  5 / 5 in the arms.  Strength is 2/5 with right hip extension, 2+/5 with right lower leg extension and 2/5 in the right foot and ankle.   Strength is 1-2/5 on the left.  Legs cannot be fully extended.  Trying to extend legs is painful, left worse than right  Sensory: She has mildly reduced vibration sensation in the right leg.  Coordination: Cerebellar testing reveals good finger-nose-finger.  She is unable to do heel-to-shin.  Gait and station:    She cannot stand or walk.   Reflexes: Deep tendon reflexes are symmetric and normal in arms, trace at the knees and ankles.      DIAGNOSTIC DATA (LABS, IMAGING, TESTING) - I reviewed patient records, labs, notes, testing and imaging myself where available.  Lab Results  Component Value Date   WBC 12.2 (H) 10/19/2020   HGB 10.7 (L) 10/19/2020   HCT 33.0 (L) 10/19/2020   MCV 94.0 10/19/2020   PLT 447 (H) 10/19/2020      Component Value Date/Time   NA 138 10/19/2020 0640   K 4.2 10/19/2020 0640   CL 103 10/19/2020 0640   CO2 27 10/19/2020 0640   GLUCOSE 95 10/19/2020 0640   BUN 9 10/19/2020 0640   CREATININE 0.56 10/19/2020 0640   CREATININE 0.65 08/26/2016 1642   CALCIUM 8.9 10/19/2020 0640   PROT 6.8 06/13/2020 1621   ALBUMIN 4.3 06/13/2020 1621   AST 25 06/13/2020 1621   ALT 42 (H) 06/13/2020 1621   ALKPHOS 90 06/13/2020 1621   BILITOT 0.2 06/13/2020 1621   GFRNONAA >60 10/19/2020 0640   GFRAA >60 12/07/2019 0657    Lab Results  Component Value Date  UJWJXBJY78 278 11/28/2019   Lab Results  Component Value Date   TSH 4.46 06/13/2020       ASSESSMENT AND PLAN  Multiple sclerosis (HCC)  Spastic diplegia (HCC)  Neurogenic bladder  Spasticity  1.   There were no new lesions on recent MRI.  She will hold off on starting a disease modifying therapy. 2.   Trial of low-dose oxybutynin for her bladder.  If she feels that hesitancy worsens she should stop. 3.   Xeomin 400 Units injected as follows: 150 units injected into the hamstring muscles  (semimembranosus, semitendinosus and biceps femoris) on the right.  220 units injected into the hamstring  muscles (semimembranosus, semitendinosus and biceps femoris) on the left and 30 units injected into the left gastrocnemius and soleus muscles.  She tolerated the injections well. 4.   Change Robaxin to baclofen 40 mg total (10 mg every morning, 10 mg every afternoon and 20 mg nightly  5.   return in 1 year or sooner if there are new or worsening neurologic.  50-minute office visit with the majority of the time spent face-to-face for history and physical, discussion/counseling and decision-making.  Additional time with record review and documentation.  Queenie Aufiero A. Epimenio Foot, MD, [facility] 01/30/2021, 12:55 PM Certified in Neurology, Clinical Neurophysiology, Sleep Medicine and Neuroimaging  [facility] [address], [address] [place], [place] [address] [phone]

## 2021-02-13 ENCOUNTER — Telehealth: Payer: Self-pay | Admitting: Internal Medicine

## 2021-02-13 NOTE — Telephone Encounter (Signed)
Spoke to pt. Explained how Dr Karle Starch home visits work. Advised her she would need to be seen somewhere to have the are evaluated. She will get with her son.

## 2021-02-13 NOTE — Telephone Encounter (Signed)
I can put her on my home visit list---but I don't make emergency visits. And based on my schedule---I probably couldn't get out there till the end of March at the earliest. Not sure what other options there are

## 2021-02-13 NOTE — Telephone Encounter (Signed)
Patient just came home from rehab. Patient states that she has a open wound on the  buttocks. They are wondering if you could do a home visit being the patient is not mobile. Patient states it has pus and fluid coming from it. Please advise patient [name]

## 2021-02-13 NOTE — Telephone Encounter (Signed)
I called and spoke to the patient. I explained that I can't come out--at least for some weeks. She needs action though--suggested she start with urgent care since the wound apparently is small and she is not sick (like with fever).  Asked her about going on the home visit list otherwise--and she declined

## 2021-02-13 NOTE — Telephone Encounter (Signed)
Patient's son called in stating that he would like to discuss this with Dr.Letvak, himself, further. Informed son that it was already discussed with patient. Son still requesting call from PCP and not CMA. Please advise.

## 2021-02-14 ENCOUNTER — Telehealth: Payer: Self-pay

## 2021-02-14 NOTE — Telephone Encounter (Signed)
That is fine 

## 2021-02-14 NOTE — Telephone Encounter (Signed)
Verbal orders left on VM for [facility].

## 2021-02-14 NOTE — Telephone Encounter (Signed)
Kreg Shropshire PT with [facility] left v/m requesting verbal orders for [facility] PT 1 x a wk for 9 wks for range of motion, strengthen legs and trunk and bed mobility.

## 2021-02-15 ENCOUNTER — Telehealth: Payer: Self-pay | Admitting: *Deleted

## 2021-02-15 DIAGNOSIS — S31000D Unspecified open wound of lower back and pelvis without penetration into retroperitoneum, subsequent encounter: Secondary | ICD-10-CM

## 2021-02-15 NOTE — Telephone Encounter (Signed)
Pam nurse with [facility] left a voicemail stating that she saw the patient and is treating her for a wound on her sacral area that is about 3 cm by 1.5 cm with gray white fluff. Pam stated that she wants an order to treat the wound with santyl dressing. Pam stated that she needs a script to be sent to the pharmacy because she would like instructions for the caregiver to change the dressing .Pam stated that they will teach the caregiver how to change the dressing daily.  Pam stated that she would like the patient to be referred to a wound care center as soon as possible. Pam stated that the care is being transferred to their new name which is [facility]. Pharmacy [facility]  Ask for manager when calling the verbal order back.

## 2021-02-16 NOTE — Addendum Note (Signed)
Addended by: Lorre Munroe on: 02/16/2021 01:17 PM   Modules accepted: Orders

## 2021-02-16 NOTE — Telephone Encounter (Cosign Needed)
Ok to use [facility]. Referral to wound care center placed

## 2021-02-18 NOTE — Telephone Encounter (Signed)
Patient's son called in checking on script. Asking it to be sent in via fax to [facility]. Please advise.

## 2021-02-18 NOTE — Telephone Encounter (Signed)
She is needing her wound care medication , the pharmacy is [facility] [place]

## 2021-02-19 MED ORDER — SANTYL 250 UNIT/GM EX OINT: 1.0000 "application " | TOPICAL_OINTMENT | Freq: Every day | CUTANEOUS | 2 refills | Status: DC

## 2021-02-19 NOTE — Addendum Note (Signed)
Addended by: Lorre Munroe on: 02/19/2021 08:16 AM   Modules accepted: Orders

## 2021-02-19 NOTE — Telephone Encounter (Signed)
RX sent to pharmacy  

## 2021-02-19 NOTE — Telephone Encounter (Signed)
Spoke to pt's son to let him know we sent in the [place].

## 2021-02-27 ENCOUNTER — Other Ambulatory Visit: Payer: Self-pay

## 2021-02-27 ENCOUNTER — Encounter: Payer: Medicare Other | Attending: Internal Medicine | Admitting: Internal Medicine

## 2021-02-27 DIAGNOSIS — G35 Multiple sclerosis: Secondary | ICD-10-CM | POA: Insufficient documentation

## 2021-02-27 DIAGNOSIS — L89153 Pressure ulcer of sacral region, stage 3: Secondary | ICD-10-CM | POA: Diagnosis not present

## 2021-02-27 NOTE — Progress Notes (Signed)
[name], [name] ([id number]) Visit Report for 02/27/2021 Abuse/Suicide Risk Screen Details Patient Name: [name], [name]. Date of Service: 02/27/2021 8:30 AM Medical Record Number: [id number] Patient Account Number: [id number] Date of Birth/Sex: 19-Jan-1946 (74 y.o. F) Treating RN: Rogers Blocker Primary Care Yurani Fettes: Tillman Abide Other Clinician: Lolita Cram Referring Petrita Blunck: Tillman Abide Treating Chandler Stofer/Extender: Altamese [place] in Treatment: 0 Abuse/Suicide Risk Screen Items Answer ABUSE RISK SCREEN: Has anyone close to you tried to hurt or harm you recentlyo No Do you feel uncomfortable with anyone in your familyo No Has anyone forced you do things that you didnot want to doo No Electronic Signature(s) Signed: 02/27/2021 5:04:06 PM By: Phillis Haggis, Dondra Prader RN Entered By: Phillis Haggis, Kenia on 02/27/2021 08:43:10 Kimberly Lamb D. ([id number]) -------------------------------------------------------------------------------- Activities of Daily Living Details Patient Name: Kimberly Lamb D. Date of Service: 02/27/2021 8:30 AM Medical Record Number: [id number] Patient Account Number: [id number] Date of Birth/Sex: 1946/02/27 (74 y.o. F) Treating RN: Rogers Blocker Primary Care Emie Sommerfeld: Tillman Abide Other Clinician: Lolita Cram Referring Louretta Tantillo: Tillman Abide Treating Braiden Presutti/Extender: Altamese [place] in Treatment: 0 Activities of Daily Living Items Answer Activities of Daily Living (Please select one for each item) Drive Automobile Not Able Take Medications Completely Able Use Telephone Completely Able Care for Appearance Need Assistance Use Toilet Need Assistance Bath / Shower Need Assistance Dress Self Need Assistance Feed Self Need Assistance Walk Not Able Get In / Out Bed Need Assistance Housework Need Assistance Prepare Meals Need Assistance Handle Money Completely Able Shop for Self Need Assistance Electronic  Signature(s) Signed: 02/27/2021 5:04:06 PM By: Lajean Manes RN Entered By: Phillis Haggis, Dondra Prader on 02/27/2021 08:43:44 Arsenio Loader ([id number]) -------------------------------------------------------------------------------- Education Screening Details Patient Name: Kimberly Lamb D. Date of Service: 02/27/2021 8:30 AM Medical Record Number: [id number] Patient Account Number: [id number] Date of Birth/Sex: 06/06/1946 (74 y.o. F) Treating RN: Rogers Blocker Primary Care Cailen Mihalik: Tillman Abide Other Clinician: Lolita Cram Referring Jhoanna Heyde: Tillman Abide Treating Autry Prust/Extender: Altamese [place] in Treatment: 0 Primary Learner Assessed: Patient Learning Preferences/Education Level/Primary Language Learning Preference: Explanation, Demonstration Highest Education Level: High School Preferred Language: English Cognitive Barrier Language Barrier: No Translator Needed: No Memory Deficit: No Emotional Barrier: No Cultural/Religious Beliefs Affecting Medical Care: No Physical Barrier Impaired Vision: No Impaired Hearing: No Decreased Hand dexterity: No Knowledge/Comprehension Knowledge Level: Medium Comprehension Level: Medium Ability to understand written instructions: Medium Ability to understand verbal instructions: Medium Motivation Anxiety Level: Calm Cooperation: Cooperative Education Importance: Acknowledges Need Interest in Health Problems: Asks Questions Perception: Coherent Willingness to Engage in Self-Management High Activities: Readiness to Engage in Self-Management High Activities: Electronic Signature(s) Signed: 02/27/2021 5:04:06 PM By: Phillis Haggis, Dondra Prader RN Entered By: Phillis Haggis, Dondra Prader on 02/27/2021 08:44:33 Arsenio Loader ([id number]) -------------------------------------------------------------------------------- Fall Risk Assessment Details Patient Name: Kimberly Lamb D. Date of Service: 02/27/2021 8:30  AM Medical Record Number: [id number] Patient Account Number: [id number] Date of Birth/Sex: 1946/01/17 (74 y.o. F) Treating RN: Rogers Blocker Primary Care Keoshia Steinmetz: Tillman Abide Other Clinician: Lolita Cram Referring Oneisha Ammons: Tillman Abide Treating Sulay Brymer/Extender: Altamese [place] in Treatment: 0 Fall Risk Assessment Items Have you had 2 or more falls in the last 12 monthso 0 No Have you had any fall that resulted in injury in the last 12 monthso 0 No FALLS RISK SCREEN History of falling - immediate or within 3 months 0 No Secondary diagnosis (Do you have 2 or more medical diagnoseso) 0 No Ambulatory aid None/bed rest/wheelchair/nurse 0 Yes Crutches/cane/walker 0 No Furniture 0  No Intravenous therapy Access/Saline/Heparin Lock 0 No Gait/Transferring Normal/ bed rest/ wheelchair 0 Yes Weak (short steps with or without shuffle, stooped but able to lift head while walking, may 0 No seek support from furniture) Impaired (short steps with shuffle, may have difficulty arising from chair, head down, impaired 0 No balance) Mental Status Oriented to own ability 0 Yes Electronic Signature(s) Signed: 02/27/2021 5:04:06 PM By: Phillis Haggis, Dondra Prader RN Entered By: Phillis Haggis, Kenia on 02/27/2021 08:45:04 Kimberly Lamb D. ([id number]) -------------------------------------------------------------------------------- Foot Assessment Details Patient Name: Kimberly Lamb D. Date of Service: 02/27/2021 8:30 AM Medical Record Number: [id number] Patient Account Number: [id number] Date of Birth/Sex: Mar 04, 1946 (74 y.o. F) Treating RN: Rogers Blocker Primary Care Stewart Pimenta: Tillman Abide Other Clinician: Lolita Cram Referring Santhosh Gulino: Tillman Abide Treating Kingsley Herandez/Extender: Altamese [place] in Treatment: 0 Foot Assessment Items Site Locations + = Sensation present, - = Sensation absent, C = Callus, U = Ulcer R = Redness, W = Warmth, M = Maceration, PU =  Pre-ulcerative lesion F = Fissure, S = Swelling, D = Dryness Assessment Right: Left: Other Deformity: No No Prior Foot Ulcer: No No Prior Amputation: No No Charcot Joint: No No Ambulatory Status: Non-ambulatory Assistance Device: Wheelchair Gait: Electronic Signature(s) Signed: 02/27/2021 5:04:06 PM By: Phillis Haggis, Dondra Prader RN Entered By: Phillis Haggis, Dondra Prader on 02/27/2021 08:45:30 Kimberly Lamb D. ([id number]) -------------------------------------------------------------------------------- Nutrition Risk Screening Details Patient Name: Kimberly Lamb D. Date of Service: 02/27/2021 8:30 AM Medical Record Number: [id number] Patient Account Number: [id number] Date of Birth/Sex: [birth year] (74 y.o. F) Treating RN: Rogers Blocker Primary Care Liliahna Cudd: Tillman Abide Other Clinician: Lolita Cram Referring Jeovany Huitron: Tillman Abide Treating Eli Adami/Extender: Altamese [place] in Treatment: 0 Height (in): 63 Weight (lbs): 120 Body Mass Index (BMI): 21.3 Nutrition Risk Screening Items Score Screening NUTRITION RISK SCREEN: I have an illness or condition that made me change the kind and/or amount of food I eat 0 No I eat fewer than two meals per day 0 No I eat few fruits and vegetables, or milk products 0 No I have three or more drinks of beer, liquor or wine almost every day 0 No I have tooth or mouth problems that make it hard for me to eat 0 No I don't always have enough money to buy the food I need 0 No I eat alone most of the time 0 No I take three or more different prescribed or over-the-counter drugs a day 0 No Without wanting to, I have lost or gained 10 pounds in the last six months 0 No I am not always physically able to shop, cook and/or feed myself 0 No Nutrition Protocols Good Risk Protocol 0 No interventions needed Moderate Risk Protocol High Risk Proctocol Risk Level: Good Risk Score: 0 Electronic Signature(s) Signed: 02/27/2021 5:04:06 PM By:  Phillis Haggis, Dondra Prader RN Entered By: Phillis Haggis, Dondra Prader on 02/27/2021 08:45:14

## 2021-02-28 NOTE — Progress Notes (Signed)
[name], [name] ([id number]) Visit Report for 02/27/2021 Allergy List Details Patient Name: [name], [name]. Date of Service: 02/27/2021 8:30 AM Medical Record Number: [id number] Patient Account Number: [id number] Date of Birth/Sex: 1946/12/10 (74 y.o. F) Treating RN: Rogers Blocker Primary Care Kagan Mutchler: Tillman Abide Other Clinician: Lolita Cram Referring Riyansh Gerstner: Tillman Abide Treating Damarien Nyman/Extender: Maxwell Caul Weeks in Treatment: 0 Allergies Active Allergies lidocaine baclofen Severity: Severe Novocain Allergy Notes Electronic Signature(s) Signed: 02/27/2021 5:04:06 PM By: Phillis Haggis, Dondra Prader RN Entered By: Phillis Haggis, Dondra Prader on 02/27/2021 08:39:50 Arsenio Loader ([id number]) -------------------------------------------------------------------------------- Arrival Information Details Patient Name: Kimberly Lamb D. Date of Service: 02/27/2021 8:30 AM Medical Record Number: [id number] Patient Account Number: [id number] Date of Birth/Sex: 08-21-1946 (74 y.o. F) Treating RN: Rogers Blocker Primary Care Mylee Falin: Tillman Abide Other Clinician: Lolita Cram Referring Betzaida Cremeens: Tillman Abide Treating Ivo Moga/Extender: Altamese [place] in Treatment: 0 Visit Information Patient Arrived: Wheel Chair Arrival Time: 08:34 Accompanied By: son Transfer Assistance: EasyPivot Patient Lift Patient Identification Verified: No Secondary Verification Process No Completed: Patient Has Alerts: Yes Patient Alerts: **ALLERGIC TO LIDOCAINE** Electronic Signature(s) Signed: 02/27/2021 10:22:53 AM By: Phillis Haggis, Dondra Prader RN Entered By: Phillis Haggis, Dondra Prader on 02/27/2021 10:22:52 Arsenio Loader ([id number]) -------------------------------------------------------------------------------- Clinic Level of Care Assessment Details Patient Name: Arsenio Loader. Date of Service: 02/27/2021 8:30 AM Medical Record Number: [id number] Patient  Account Number: [id number] Date of Birth/Sex: February 24, 1946 (74 y.o. F) Treating RN: Huel Coventry Primary Care Bennett Vanscyoc: Tillman Abide Other Clinician: Lolita Cram Referring Hilbert Briggs: Tillman Abide Treating Kodee Drury/Extender: Altamese [place] in Treatment: 0 Clinic Level of Care Assessment Items TOOL 1 Quantity Score []  - Use when EandM and Procedure is performed on INITIAL visit 0 ASSESSMENTS - Nursing Assessment / Reassessment X - General Physical Exam (combine w/ comprehensive assessment (listed just below) when performed on new 1 20 pt. evals) X- 1 25 Comprehensive Assessment (HX, ROS, Risk Assessments, Wounds Hx, etc.) ASSESSMENTS - Wound and Skin Assessment / Reassessment []  - Dermatologic / Skin Assessment (not related to wound area) 0 ASSESSMENTS - Ostomy and/or Continence Assessment and Care []  - Incontinence Assessment and Management 0 []  - 0 Ostomy Care Assessment and Management (repouching, etc.) PROCESS - Coordination of Care X - Simple Patient / Family Education for ongoing care 1 15 []  - 0 Complex (extensive) Patient / Family Education for ongoing care X- 1 10 Staff obtains Consents, Records, Test Results / Process Orders []  - 0 Staff telephones HHA, Nursing Homes / Clarify orders / etc []  - 0 Routine Transfer to another Facility (non-emergent condition) []  - 0 Routine Hospital Admission (non-emergent condition) X- 1 15 New Admissions / / Ordering NPWT, Apligraf, etc. []  - 0 Emergency Hospital Admission (emergent condition) PROCESS - Special Needs []  - Pediatric / Minor Patient Management 0 []  - 0 Isolation Patient Management []  - 0 Hearing / Language / Visual special needs []  - 0 Assessment of Community assistance (transportation, D/C planning, etc.) []  - 0 Additional assistance / Altered mentation []  - 0 Support Surface(s) Assessment (bed, cushion, seat, etc.) INTERVENTIONS - Miscellaneous []  - External ear exam  0 X- 1 10 Patient Transfer (multiple staff / / Similar devices) []  - 0 Simple Staple / Suture removal (25 or less) []  - 0 Complex Staple / Suture removal (26 or more) []  - 0 Hypo/Hyperglycemic Management (do not check if billed separately) []  - 0 Ankle / Brachial Index (ABI) - do not check if billed separately Has the patient  been seen at the hospital within the last three years: Yes Total Score: 95 Level Of Care: New/Established - Level 3 [name], [name] ([id number]) Electronic Signature(s) Signed: 02/27/2021 6:32:03 PM By: Elliot Gurney, BSN, RN, CWS, Kim RN, BSN Entered By: Elliot Gurney, BSN, RN, CWS, Kim on 02/27/2021 09:20:19 Arsenio Loader ([id number]) -------------------------------------------------------------------------------- Encounter Discharge Information Details Patient Name: Kimberly Lamb D. Date of Service: 02/27/2021 8:30 AM Medical Record Number: [id number] Patient Account Number: [id number] Date of Birth/Sex: Jan 06, 1946 (74 y.o. F) Treating RN: Huel Coventry Primary Care Nannie Starzyk: Tillman Abide Other Clinician: Lolita Cram Referring Damilola Flamm: Tillman Abide Treating Sebasthian Stailey/Extender: Altamese [place] in Treatment: 0 Encounter Discharge Information Items Post Procedure Vitals Discharge Condition: Stable Temperature (F): 98.3 Ambulatory Status: Ambulatory Pulse (bpm): 82 Discharge Destination: Home Respiratory Rate (breaths/min): 18 Transportation: Private Auto Blood Pressure (mmHg): 115/72 Accompanied By: self Schedule Follow-up Appointment: No Clinical Summary of Care: Electronic Signature(s) Signed: 02/27/2021 6:32:03 PM By: Elliot Gurney, BSN, RN, CWS, Kim RN, BSN Entered By: Elliot Gurney, BSN, RN, CWS, Kim on 02/27/2021 09:21:51 Arsenio Loader ([id number]) -------------------------------------------------------------------------------- Lower Extremity Assessment Details Patient Name: Kimberly Lamb D. Date of Service: 02/27/2021 8:30  AM Medical Record Number: [id number] Patient Account Number: [id number] Date of Birth/Sex: 01-11-1946 (74 y.o. F) Treating RN: Rogers Blocker Primary Care Elyanna Wallick: Tillman Abide Other Clinician: Lolita Cram Referring Elowyn Raupp: Tillman Abide Treating Fannie Gathright/Extender: Altamese [place] in Treatment: 0 Electronic Signature(s) Signed: 02/27/2021 5:04:06 PM By: Phillis Haggis, Dondra Prader RN Entered By: Phillis Haggis, Dondra Prader on 02/27/2021 08:55:22 Arsenio Loader ([id number]) -------------------------------------------------------------------------------- Multi Wound Chart Details Patient Name: Kimberly Lamb D. Date of Service: 02/27/2021 8:30 AM Medical Record Number: [id number] Patient Account Number: [id number] Date of Birth/Sex: 05/14/1946 (74 y.o. F) Treating RN: Huel Coventry Primary Care Bane Hagy: Tillman Abide Other Clinician: Lolita Cram Referring Allyne Hebert: Tillman Abide Treating Jeyson Deshotel/Extender: Altamese [place] in Treatment: 0 Vital Signs Height(in): 63 Pulse(bpm): 82 Weight(lbs): 120 Blood Pressure(mmHg): 115/72 Body Mass Index(BMI): 21 Temperature(F): 98.3 Respiratory Rate(breaths/min): 18 Photos: [1:No Photos] [N/A:N/A] Wound Location: [1:Sacrum] [N/A:N/A] Wounding Event: [1:Pressure Injury] [N/A:N/A] Primary Etiology: [1:Pressure Ulcer] [N/A:N/A] Comorbid History: [1:History of pressure wounds, Osteoarthritis] [N/A:N/A] Date Acquired: [1:02/12/2021] [N/A:N/A] Weeks of Treatment: [1:0] [N/A:N/A] Wound Status: [1:Open] [N/A:N/A] Measurements L x W x D (cm) [1:2x1.5x1] [N/A:N/A] Area (cm) : [1:2.356] [N/A:N/A] Volume (cm) : [1:2.356] [N/A:N/A] % Reduction in Area: [1:0.00%] [N/A:N/A] % Reduction in Volume: [1:0.00%] [N/A:N/A] Classification: [1:Category/Stage III] [N/A:N/A] Exudate Amount: [1:Medium] [N/A:N/A] Exudate Type: [1:Serosanguineous] [N/A:N/A] Exudate Color: [1:red, brown] [N/A:N/A] Wound Margin: [1:Flat and Intact]  [N/A:N/A] Granulation Amount: [1:Small (1-33%)] [N/A:N/A] Granulation Quality: [1:Red] [N/A:N/A] Necrotic Amount: [1:Large (67-100%)] [N/A:N/A] Exposed Structures: [1:Fat Layer (Subcutaneous Tissue): Yes Fascia: No Tendon: No Muscle: No Joint: No Bone: No] [N/A:N/A] Epithelialization: [1:None] [N/A:N/A] Debridement: [1:Debridement - Excisional] [N/A:N/A] Pre-procedure Verification/Time 09:08 [N/A:N/A] Out Taken: Tissue Debrided: [1:Subcutaneous, Slough] [N/A:N/A] Level: [1:Skin/Subcutaneous Tissue] [N/A:N/A] Debridement Area (sq cm): [1:3] [N/A:N/A] Instrument: [1:Curette] [N/A:N/A] Bleeding: [1:Minimum] [N/A:N/A] Hemostasis Achieved: [1:Pressure] [N/A:N/A] Debridement Treatment [1:Procedure was tolerated well] [N/A:N/A] Response: Post Debridement [1:2x1.5x1.3] [N/A:N/A] Measurements L x W x D (cm) Post Debridement Volume: [1:3.063] [N/A:N/A] (cm) Post Debridement Stage: [1:Unstageable/Unclassified Debridement] [N/A:N/A N/A] Treatment Notes Wound #1 (Sacrum) [name], [name] ([id number]) Cleanser Soap and Water Discharge Instruction: Gently cleanse wound with antibacterial soap, rinse and pat dry prior to dressing wounds Peri-Wound Care Topical Primary Dressing Prisma 4.34 (in) Discharge Instruction: Moisten w/normal saline or sterile water; Cover wound as directed. Do not remove from wound bed. Secondary Dressing Mepilex Border Flex, 4x4 (in/in) Discharge  Instruction: Apply to wound as directed. Do not cut. Secured With Compression Wrap Compression Stockings Facilities manager) Signed: 02/28/2021 2:54:56 PM By: Baltazar Najjar MD Entered By: Baltazar Najjar on 02/27/2021 09:41:12 Arsenio Loader ([id number]) -------------------------------------------------------------------------------- Multi-Disciplinary Care Plan Details Patient Name: Kimberly Lamb D. Date of Service: 02/27/2021 8:30 AM Medical Record Number: [id number] Patient Account Number:  [id number] Date of Birth/Sex: 10/13/1946 (74 y.o. F) Treating RN: Huel Coventry Primary Care Louay Myrie: Tillman Abide Other Clinician: Lolita Cram Referring Ladislao Cohenour: Tillman Abide Treating Lanetta Figuero/Extender: Altamese [place] in Treatment: 0 Active Inactive Necrotic Tissue Nursing Diagnoses: Impaired tissue integrity related to necrotic/devitalized tissue Knowledge deficit related to management of necrotic/devitalized tissue Goals: Necrotic/devitalized tissue will be minimized in the wound bed Date Initiated: 02/27/2021 Target Resolution Date: 03/13/2021 Goal Status: Active Interventions: Assess patient pain level pre-, during and post procedure and prior to discharge Treatment Activities: Excisional debridement : 02/27/2021 Notes: Orientation to the Wound Care Program Nursing Diagnoses: Knowledge deficit related to the wound healing center program Goals: Patient/caregiver will verbalize understanding of the Wound Healing Center Program Date Initiated: 02/27/2021 Target Resolution Date: 03/13/2021 Goal Status: Active Interventions: Provide education on orientation to the wound center Notes: Pressure Nursing Diagnoses: Potential for impaired tissue integrity related to pressure, friction, moisture, and shear Goals: Patient will remain free from development of additional pressure ulcers Date Initiated: 02/27/2021 Target Resolution Date: 03/13/2021 Goal Status: Active Interventions: Assess: immobility, friction, shearing, incontinence upon admission and as needed Provide education on pressure ulcers Notes: Wound/Skin Impairment Nursing Diagnoses: Impaired tissue integrity [name], [name] ([id number]) Goals: Patient/caregiver will verbalize understanding of skin care regimen Date Initiated: 02/27/2021 Target Resolution Date: 03/13/2021 Goal Status: Active Ulcer/skin breakdown will have a volume reduction of 30% by week 4 Date Initiated: 02/27/2021 Target  Resolution Date: 03/23/2021 Goal Status: Active Interventions: Assess ulceration(s) every visit Treatment Activities: Skin care regimen initiated : 02/27/2021 Topical wound management initiated : 02/27/2021 Notes: Electronic Signature(s) Signed: 02/27/2021 6:32:03 PM By: Elliot Gurney, BSN, RN, CWS, Kim RN, BSN Entered By: Elliot Gurney, BSN, RN, CWS, Kim on 02/27/2021 09:08:11 Arsenio Loader ([id number]) -------------------------------------------------------------------------------- Pain Assessment Details Patient Name: Kimberly Lamb D. Date of Service: 02/27/2021 8:30 AM Medical Record Number: [id number] Patient Account Number: [id number] Date of Birth/Sex: November 21, 1946 (74 y.o. F) Treating RN: Rogers Blocker Primary Care Jhane Lorio: Tillman Abide Other Clinician: Lolita Cram Referring Margaree Sandhu: Tillman Abide Treating Sianne Tejada/Extender: Altamese [place] in Treatment: 0 Active Problems Location of Pain Severity and Description of Pain Patient Has Paino Yes Site Locations Pain Location: Pain in Ulcers Rate the pain. Current Pain Level: 4 Pain Management and Medication Current Pain Management: Electronic Signature(s) Signed: 02/27/2021 5:04:06 PM By: Phillis Haggis, Dondra Prader RN Entered By: Phillis Haggis, Kenia on 02/27/2021 08:37:13 Arsenio Loader ([id number]) -------------------------------------------------------------------------------- Patient/Caregiver Education Details Patient Name: Kimberly Lamb D. Date of Service: 02/27/2021 8:30 AM Medical Record Number: [id number] Patient Account Number: [id number] Date of Birth/Gender: 09-21-1946 (75 y.o. F) Treating RN: Huel Coventry Primary Care Physician: Tillman Abide Other Clinician: Lolita Cram Referring Physician: Tillman Abide Treating Physician/Extender: Altamese [place] in Treatment: 0 Education Assessment Education Provided To: Patient Education Topics Provided Pressure: Handouts: Pressure Ulcers:  Care and Offloading Methods: Demonstration, Explain/Verbal Responses: State content correctly Welcome To The [facility]: Handouts: Welcome To The [facility] Methods: Demonstration, Explain/Verbal Responses: State content correctly Wound/Skin Impairment: Handouts: Caring for Your Ulcer Methods: Demonstration, Explain/Verbal Responses: State content correctly Electronic Signature(s) Signed: 02/27/2021 6:32:03 PM By: Elliot Gurney, BSN, RN, CWS, Kim RN,  BSN Entered By: Elliot Gurney, BSN, RN, CWS, Kim on 02/27/2021 09:20:52 Arsenio Loader ([id number]) -------------------------------------------------------------------------------- Wound Assessment Details Patient Name: Kimberly Lamb D. Date of Service: 02/27/2021 8:30 AM Medical Record Number: [id number] Patient Account Number: [id number] Date of Birth/Sex: 1946/07/23 (74 y.o. F) Treating RN: Rogers Blocker Primary Care Climmie Cronce: Tillman Abide Other Clinician: Lolita Cram Referring Spiro Ausborn: Tillman Abide Treating Natia Fahmy/Extender: Altamese [place] in Treatment: 0 Wound Status Wound Number: 1 Primary Etiology: Pressure Ulcer Wound Location: Sacrum Wound Status: Open Wounding Event: Pressure Injury Comorbid History: History of pressure wounds, Osteoarthritis Date Acquired: 02/12/2021 Weeks Of Treatment: 0 Clustered Wound: No Photos Photo Uploaded By: Phillis Haggis, Dondra Prader on 02/27/2021 11:40:31 Wound Measurements Length: (cm) 2 Width: (cm) 1.5 Depth: (cm) 1 Area: (cm) 2.356 Volume: (cm) 2.356 % Reduction in Area: 0% % Reduction in Volume: 0% Epithelialization: None Tunneling: No Undermining: No Wound Description Classification: Category/Stage III Wound Margin: Flat and Intact Exudate Amount: Medium Exudate Type: Serosanguineous Exudate Color: red, brown Foul Odor After Cleansing: No Slough/Fibrino Yes Wound Bed Granulation Amount: Small (1-33%) Exposed Structure Granulation Quality: Red Fascia  Exposed: No Necrotic Amount: Large (67-100%) Fat Layer (Subcutaneous Tissue) Exposed: Yes Necrotic Quality: Adherent Slough Tendon Exposed: No Muscle Exposed: No Joint Exposed: No Bone Exposed: No Treatment Notes Wound #1 (Sacrum) Cleanser Soap and Water Discharge Instruction: Gently cleanse wound with antibacterial soap, rinse and pat dry prior to dressing wounds [name], Kimberly D. ([id number]) Peri-Wound Care Topical Primary Dressing Prisma 4.34 (in) Discharge Instruction: Moisten w/normal saline or sterile water; Cover wound as directed. Do not remove from wound bed. Secondary Dressing Mepilex Border Flex, 4x4 (in/in) Discharge Instruction: Apply to wound as directed. Do not cut. Secured With Compression Wrap Compression Stockings Facilities manager) Signed: 02/27/2021 5:04:06 PM By: Phillis Haggis, Dondra Prader RN Signed: 02/27/2021 6:32:03 PM By: Elliot Gurney, BSN, RN, CWS, Kim RN, BSN Entered By: Elliot Gurney, BSN, RN, CWS, Kim on 02/27/2021 09:23:16 Arsenio Loader ([id number]) -------------------------------------------------------------------------------- Vitals Details Patient Name: Kimberly Lamb D. Date of Service: 02/27/2021 8:30 AM Medical Record Number: [id number] Patient Account Number: [id number] Date of Birth/Sex: 1946/11/25 (74 y.o. F) Treating RN: Rogers Blocker Primary Care Faven Watterson: Tillman Abide Other Clinician: Lolita Cram Referring Zia Najera: Tillman Abide Treating Gerry Heaphy/Extender: Altamese [place] in Treatment: 0 Vital Signs Time Taken: 08:37 Temperature (F): 98.3 Height (in): 63 Pulse (bpm): 82 Source: Stated Respiratory Rate (breaths/min): 18 Weight (lbs): 120 Blood Pressure (mmHg): 115/72 Source: Stated Reference Range: 80 - 120 mg / dl Body Mass Index (BMI): 21.3 Electronic Signature(s) Signed: 02/27/2021 5:04:06 PM By: Phillis Haggis, Dondra Prader RN Entered By: Phillis Haggis, Dondra Prader on 02/27/2021 08:38:44

## 2021-02-28 NOTE — Progress Notes (Signed)
[name], Elie D. ([id number]) Visit Report for 02/27/2021 Chief Complaint Document Details Patient Name: [name], Kimberly D. Date of Service: 02/27/2021 8:30 AM Medical Record Number: [id number] Patient Account Number: [id number] Date of Birth/Sex: 1946-01-17 (74 y.o. F) Treating RN: Huel CoventryWoody, Kim Primary Care Provider: Tillman AbideLetvak, Richard Other Clinician: Lolita CramBurnette, Kyara Referring Provider: Tillman AbideLetvak, Richard Treating Provider/Extender: Altamese CarolinaOBSON, Kier Smead G Weeks in Treatment: 0 Information Obtained from: Patient Chief Complaint 02/27/2021; patient is here for review of a pressure ulcer on the sacrum Electronic Signature(s) Signed: 02/28/2021 2:54:56 PM By: Baltazar Najjarobson, Benita Boonstra MD Entered By: Baltazar Najjarobson, Evelyne Makepeace on 02/27/2021 09:42:04 [name], Kimberly D. ([id number]) -------------------------------------------------------------------------------- Debridement Details Patient Name: [name], Chamara D. Date of Service: 02/27/2021 8:30 AM Medical Record Number: [id number] Patient Account Number: [id number] Date of Birth/Sex: 1946-01-17 (74 y.o. F) Treating RN: Huel CoventryWoody, Kim Primary Care Provider: Tillman AbideLetvak, Richard Other Clinician: Lolita CramBurnette, Kyara Referring Provider: Tillman AbideLetvak, Richard Treating Provider/Extender: Altamese CarolinaOBSON, Jahnavi Muratore G Weeks in Treatment: 0 Debridement Performed for Wound #1 Sacrum Assessment: Performed By: Physician Maxwell CaulOBSON, Aalaya Yadao G, MD Debridement Type: Debridement Level of Consciousness (Pre- Awake and Alert procedure): Pre-procedure Verification/Time Out Yes - 09:08 Taken: Total Area Debrided (L x W): 2 (cm) x 1.5 (cm) = 3 (cm) Tissue and other material Viable, Non-Viable, Fat, Slough, Subcutaneous, Slough debrided: Level: Skin/Subcutaneous Tissue Debridement Description: Excisional Instrument: Curette Bleeding: Minimum Hemostasis Achieved: Pressure Response to Treatment: Procedure was tolerated well Level of Consciousness (Post- Awake and Alert procedure): Post Debridement Measurements  of Total Wound Length: (cm) 2 Stage: Unstageable/Unclassified Width: (cm) 1.5 Depth: (cm) 1.3 Volume: (cm) 3.063 Character of Wound/Ulcer Post Debridement: Stable Post Procedure Diagnosis Same as Pre-procedure Electronic Signature(s) Signed: 02/27/2021 6:32:03 PM By: Elliot GurneyWoody, BSN, RN, CWS, Kim RN, BSN Signed: 02/28/2021 2:54:56 PM By: Baltazar Najjarobson, Kree Rafter MD Entered By: Baltazar Najjarobson, Desia Saban on 02/27/2021 09:41:39 [name], Kimberly D. ([id number]) -------------------------------------------------------------------------------- HPI Details Patient Name: [name], Kimberly D. Date of Service: 02/27/2021 8:30 AM Medical Record Number: [id number] Patient Account Number: [id number] Date of Birth/Sex: 1946-01-17 (74 y.o. F) Treating RN: Huel CoventryWoody, Kim Primary Care Provider: Tillman AbideLetvak, Richard Other Clinician: Lolita CramBurnette, Kyara Referring Provider: Tillman AbideLetvak, Richard Treating Provider/Extender: Altamese CarolinaOBSON, Krew Hortman G Weeks in Treatment: 0 History of Present Illness HPI Description: Admission 02/27/2021 This woman is a 75 year old woman with fairly advanced multiple sclerosis that was diagnosed in 1982 follows with neurology. In October of last year she had an ORIF of a tib-fib fracture and apparently a shoulder replacement. She went to [facility] is just come home 2 weeks ago. Unbeknownst to the patient or her son they noticed a wound on her sacrum. Her son is able to show me pictures on the phone generally small wound slightly bigger than a dime however completely necrotic surface. They are not exactly sure what they are using on this wound. There is a note from primary care I think a telephone conversation that they sent in [place] on 02/15/2021 although both the patient and her son think it is Sylvant which is something I am really not familiar with. [facility] they ended up paying $300 after insurance. Even for Santyl this would be expense Also the patient has  come home a lot more physically challenged. 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 This will make it difficult to keep the pressure off this area. They have [facility] and they are getting a physical therapist out. Past medical history includes; multiple sclerosis ankle fractures, disc herniation, mitral valve  prolapse, nephrolithiasis, osteoporosis, rib fractures. She has significant urinary incontinence Electronic Signature(s) Signed: 02/28/2021 2:54:56 PM By: Baltazar Najjar MD Entered By: Baltazar Najjar on 02/27/2021 09:45:38 Kimberly Loader ([id number]) -------------------------------------------------------------------------------- Physical Exam Details Patient Name: Kimberly Lamb D. Date of Service: 02/27/2021 8:30 AM Medical Record Number: [id number] Patient Account Number: [id number] Date of Birth/Sex: [birth year] (74 y.o. F) Treating RN: Huel Coventry Primary Care Provider: Tillman Abide Other Clinician: Lolita Cram Referring Provider: Tillman Abide Treating Provider/Extender: Altamese [place] in Treatment: 0 Constitutional Sitting or standing Blood Pressure is within target range for patient.. Pulse regular and within target range for patient.Marland Kitchen Respirations regular, non- labored and within target range.. Temperature is normal and within the target range for the patient.Marland Kitchen appears in no distress. Respiratory Respiratory effort is easy and symmetric bilaterally. Rate is normal at rest and on room air.. Cardiovascular She does not appear dehydrated or malnourished. Gastrointestinal (GI) Abdomen is soft and non-distended without masses or tenderness. Bowel sounds active in all quadrants.. No liver or spleen enlargement or tenderness.. Genitourinary (GU) She had some suprapubic fullness with some tenderness. I wondered about some degree of urinary retention although it was difficult to examine her with the  contracted state. Musculoskeletal Marked flexion contractures bilaterally at hips and knees.Marland Kitchen Psychiatric Patient appears depressed today. Cognitively intact. Notes Wound exam; lower sacrum. Small wound in terms of surface area however completely necrotic surface. I used pickups and a #15 scalpel to remove the first layer of this in a #5 curette to finish debriding. Unfortunately this has significant depth although I do not think it gets down to bone or periosteum at this point there was little evidence to suggest infection Electronic Signature(s) Signed: 02/28/2021 2:54:56 PM By: Baltazar Najjar MD Entered By: Baltazar Najjar on 02/27/2021 09:47:26 Kimberly Loader ([id number]) -------------------------------------------------------------------------------- Physician Orders Details Patient Name: Kimberly Lamb D. Date of Service: 02/27/2021 8:30 AM Medical Record Number: [id number] Patient Account Number: [id number] Date of Birth/Sex: [birth year] (74 y.o. F) Treating RN: Huel Coventry Primary Care Provider: Tillman Abide Other Clinician: Lolita Cram Referring Provider: Tillman Abide Treating Provider/Extender: Altamese [place] in Treatment: 0 Verbal / Phone Orders: No Diagnosis Coding Follow-up Appointments o Return Appointment in 1 week. Home Health o Home Health Company: - [facility] o [facility] for wound care. May utilize formulary equivalent dressing for wound treatment orders unless otherwise specified. Home Health Nurse may visit PRN to address patientos wound care needs. - Wound Care Orders o Scheduled days for dressing changes to be completed; exception, patient has scheduled wound care visit that day. o **Please direct any NON-WOUND related issues/requests for orders to patient's Primary Care Physician. **If current dressing causes regression in wound condition, may D/C ordered dressing product/s and apply Normal Saline Moist Dressing daily  until next Wound Healing Center or Other MD appointment. **Notify Wound Healing Center of regression in wound condition at [phone]. Bathing/ Shower/ Hygiene o May shower; gently cleanse wound with antibacterial soap, rinse and pat dry prior to dressing wounds Off-Loading o Hospital bed/mattress o Low air-loss mattress (Group 2) o Turn and reposition every 2 hours Additional Orders / Instructions o Follow Nutritious Diet and Increase Protein Intake Wound Treatment Wound #1 - Sacrum Cleanser: Soap and Water 3 x Per Week/30 Days Discharge Instructions: Gently cleanse wound with antibacterial soap, rinse and pat dry prior to dressing wounds Primary Dressing: Prisma 4.34 (in) (Home Health) 3 x Per Week/30 Days Discharge Instructions: Moisten w/normal saline or sterile water; Cover wound as  directed. Do not remove from wound bed. Secondary Dressing: Mepilex Border Flex, 4x4 (in/in) (Home Health) 3 x Per Week/30 Days Discharge Instructions: Apply to wound as directed. Do not cut. Electronic Signature(s) Signed: 02/27/2021 6:32:03 PM By: Elliot Gurney, BSN, RN, CWS, Kim RN, BSN Signed: 02/28/2021 2:54:56 PM By: Baltazar Najjar MD Entered By: Elliot Gurney, BSN, RN, CWS, Kim on 02/27/2021 09:26:03 Kimberly Loader ([id number]) -------------------------------------------------------------------------------- Problem List Details Patient Name: [name], RUMER. Date of Service: 02/27/2021 8:30 AM Medical Record Number: [id number] Patient Account Number: [id number] Date of Birth/Sex: August 07, 1946 (74 y.o. F) Treating RN: Huel Coventry Primary Care Provider: Tillman Abide Other Clinician: Lolita Cram Referring Provider: Tillman Abide Treating Provider/Extender: Altamese [place] in Treatment: 0 Active Problems ICD-10 Encounter Code Description Active Date MDM Diagnosis L89.153 Pressure ulcer of sacral region, stage 3 02/27/2021 No Yes G35 Multiple sclerosis 02/27/2021 No Yes Inactive  Problems Resolved Problems Electronic Signature(s) Signed: 02/28/2021 2:54:56 PM By: Baltazar Najjar MD Entered By: Baltazar Najjar on 02/27/2021 09:30:26 Kimberly Lamb D. ([id number]) -------------------------------------------------------------------------------- Progress Note Details Patient Name: Kimberly Lamb D. Date of Service: 02/27/2021 8:30 AM Medical Record Number: [id number] Patient Account Number: [id number] Date of Birth/Sex: December 01, 1946 (74 y.o. F) Treating RN: Huel Coventry Primary Care Provider: Tillman Abide Other Clinician: Lolita Cram Referring Provider: Tillman Abide Treating Provider/Extender: Altamese [place] in Treatment: 0 Subjective Chief Complaint Information obtained from Patient 02/27/2021; patient is here for review of a pressure ulcer on the sacrum History of Present Illness (HPI) Admission 02/27/2021 This woman is a 75 year old woman with fairly advanced multiple sclerosis that was diagnosed in 1982 follows with neurology. In October of last year she had an ORIF of a tib-fib fracture and apparently a shoulder replacement. She went to [facility] for rehabilitation is just come home 2 weeks ago. Unbeknownst to the patient or her son they noticed a wound on her sacrum. Her son is able to show me pictures on the phone generally small wound slightly bigger than a dime however completely necrotic surface. They are not exactly sure what they are using on this wound. There is a note from primary care I think a telephone conversation that they sent in [place] on 02/15/2021 although both the patient and her son think it is Sylvant which is something I am really not familiar with. They said it was $1300 at [facility] they ended up paying $300 after insurance. Even for Santyl this would be expense Also the patient has come home a lot more physically challenged. 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 This will make it difficult to keep the pressure off this area. They have [facility] and they are getting a physical therapist out. Past medical history includes; multiple sclerosis ankle fractures, disc herniation, mitral valve prolapse, nephrolithiasis, osteoporosis, rib fractures. She has significant urinary incontinence Patient History Information obtained from Patient. Allergies lidocaine, baclofen (Severity: Severe), Novocain Social History Never smoker, Marital Status - Widowed, Alcohol Use - Never, Drug Use - No History, Caffeine Use - Rarely. Medical History Eyes Denies history of Cataracts, Glaucoma, Optic Neuritis Ear/Nose/Mouth/Throat Denies history of Chronic sinus problems/congestion, Middle ear problems Hematologic/Lymphatic Denies history of Anemia, Hemophilia, Human Immunodeficiency Virus, Lymphedema, Sickle Cell Disease Respiratory Denies history of Aspiration, Asthma, Chronic Obstructive Pulmonary Disease (COPD), Pneumothorax, Sleep Apnea, Tuberculosis Cardiovascular Denies history of Angina, Arrhythmia, Congestive Heart Failure, Coronary Artery Disease, Deep Vein Thrombosis, Hypertension, Hypotension, Myocardial Infarction, Peripheral Arterial Disease, Peripheral  Venous Disease, Phlebitis, Vasculitis Gastrointestinal Denies history of Cirrhosis , Colitis, Crohn s, Hepatitis A, Hepatitis B, Hepatitis C Endocrine Denies history of Type I Diabetes, Type II Diabetes Genitourinary Denies history of End Stage Renal Disease Immunological Denies history of Lupus Erythematosus, Raynaud s, Scleroderma Integumentary (Skin) Patient has history of History of pressure wounds Denies history of History of Burn Musculoskeletal Patient has history of Osteoarthritis Denies history of Gout, Rheumatoid Arthritis, Osteomyelitis Neurologic Denies history of Dementia, Neuropathy, Quadriplegia, Paraplegia, Seizure Disorder Oncologic Denies history of Received  Chemotherapy, Received Radiation Psychiatric [name], [name] ([id number]) Denies history of Anorexia/bulimia, Confinement Anxiety Medical And Surgical History Notes Neurologic MS Review of Systems (ROS) Constitutional Symptoms (General Health) Denies complaints or symptoms of Fatigue, Fever, Chills, Marked Weight Change. Eyes Denies complaints or symptoms of Dry Eyes, Vision Changes, Glasses / Contacts. Ear/Nose/Mouth/Throat Denies complaints or symptoms of Difficult clearing ears, Sinusitis. Hematologic/Lymphatic Denies complaints or symptoms of Bleeding / Clotting Disorders, Human Immunodeficiency Virus. Respiratory Denies complaints or symptoms of Chronic or frequent coughs, Shortness of Breath. Cardiovascular Denies complaints or symptoms of Chest pain, LE edema. Gastrointestinal Denies complaints or symptoms of Frequent diarrhea, Nausea, Vomiting. Endocrine Denies complaints or symptoms of Hepatitis, Thyroid disease, Polydypsia (Excessive Thirst). Genitourinary Complains or has symptoms of Incontinence/dribbling. Denies complaints or symptoms of Kidney failure/ Dialysis. Immunological Denies complaints or symptoms of Hives, Itching. Integumentary (Skin) Complains or has symptoms of Wounds. Denies complaints or symptoms of Bleeding or bruising tendency, Breakdown, Swelling. Musculoskeletal Complains or has symptoms of Muscle Weakness. Denies complaints or symptoms of Muscle Pain. Neurologic Denies complaints or symptoms of Numbness/parasthesias, Focal/Weakness. Psychiatric Denies complaints or symptoms of Anxiety, Claustrophobia. Objective Constitutional Sitting or standing Blood Pressure is within target range for patient.. Pulse regular and within target range for patient.Marland Kitchen Respirations regular, non- labored and within target range.. Temperature is normal and within the target range for the patient.Marland Kitchen appears in no distress. Vitals Time Taken: 8:37 AM, Height: 63 in,  Source: Stated, Weight: 120 lbs, Source: Stated, BMI: 21.3, Temperature: 98.3 F, Pulse: 82 bpm, Respiratory Rate: 18 breaths/min, Blood Pressure: 115/72 mmHg. Respiratory Respiratory effort is easy and symmetric bilaterally. Rate is normal at rest and on room air.. Cardiovascular She does not appear dehydrated or malnourished. Gastrointestinal (GI) Abdomen is soft and non-distended without masses or tenderness. Bowel sounds active in all quadrants.. No liver or spleen enlargement or tenderness.. Genitourinary (GU) She had some suprapubic fullness with some tenderness. I wondered about some degree of urinary retention although it was difficult to examine her with the contracted state. Musculoskeletal Marked flexion contractures bilaterally at hips and knees.Marland Kitchen Psychiatric Patient appears depressed today. Cognitively intact. [name], [name] ([id number]) General Notes: Wound exam; lower sacrum. Small wound in terms of surface area however completely necrotic surface. I used pickups and a #15 scalpel to remove the first layer of this in a #5 curette to finish debriding. Unfortunately this has significant depth although I do not think it gets down to bone or periosteum at this point there was little evidence to suggest infection Integumentary (Hair, Skin) Wound #1 status is Open. Original cause of wound was Pressure Injury. The date acquired was: 02/12/2021. The wound is located on the Sacrum. The wound measures 2cm length x 1.5cm width x 1cm depth; 2.356cm^2 area and 2.356cm^3 volume. There is Fat Layer (Subcutaneous Tissue) exposed. There is no tunneling or undermining noted. There is a medium amount of serosanguineous drainage noted. The wound margin is flat and intact. There is small (  1-33%) red granulation within the wound bed. There is a large (67-100%) amount of necrotic tissue within the wound bed including Adherent Slough. Assessment Active Problems ICD-10 Pressure ulcer of sacral  region, stage 3 Multiple sclerosis Procedures Wound #1 Pre-procedure diagnosis of Wound #1 is a Pressure Ulcer located on the Sacrum . There was a Excisional Skin/Subcutaneous Tissue Debridement with a total area of 3 sq cm performed by Maxwell Caul, MD. With the following instrument(s): Curette to remove Viable and Non-Viable tissue/material. Material removed includes Fat, Subcutaneous Tissue, and Slough. No specimens were taken. A time out was conducted at 09:08, prior to the start of the procedure. A Minimum amount of bleeding was controlled with Pressure. The procedure was tolerated well. Post Debridement Measurements: 2cm length x 1.5cm width x 1.3cm depth; 3.063cm^3 volume. Post debridement Stage noted as Unstageable/Unclassified. Character of Wound/Ulcer Post Debridement is stable. Post procedure Diagnosis Wound #1: Same as Pre-Procedure Plan Follow-up Appointments: Return Appointment in 1 week. Home Health: Home Health Company: - [facility] for wound care. May utilize formulary equivalent dressing for wound treatment orders unless otherwise specified. Home Health Nurse may visit PRN to address patient s wound care needs. - Wound Care Orders Scheduled days for dressing changes to be completed; exception, patient has scheduled wound care visit that day. **Please direct any NON-WOUND related issues/requests for orders to patient's Primary Care Physician. **If current dressing causes regression in wound condition, may D/C ordered dressing product/s and apply Normal Saline Moist Dressing daily until next Wound Healing Center or Other MD appointment. **Notify Wound Healing Center of regression in wound condition at [phone]. Bathing/ Shower/ Hygiene: May shower; gently cleanse wound with antibacterial soap, rinse and pat dry prior to dressing wounds Off-Loading: Hospital bed/mattress Low air-loss mattress (Group 2) Turn and reposition every 2 hours Additional  Orders / Instructions: Follow Nutritious Diet and Increase Protein Intake WOUND #1: - Sacrum Wound Laterality: Cleanser: Soap and Water 3 x Per Week/30 Days Discharge Instructions: Gently cleanse wound with antibacterial soap, rinse and pat dry prior to dressing wounds Primary Dressing: Prisma 4.34 (in) (Home Health) 3 x Per Week/30 Days Discharge Instructions: Moisten w/normal saline or sterile water; Cover wound as directed. Do not remove from wound bed. Secondary Dressing: Mepilex Border Flex, 4x4 (in/in) (Home Health) 3 x Per Week/30 Days Discharge Instructions: Apply to wound as directed. Do not cut. [name], Kimberly D. ([id number]) 1. We will try to verify what exactly they were using on the wound although I was at I suspect that this was Santyl 2. I change the dressing to silver collagen with backing wet-to-dry this can be changed every 2 days or as needed if it is soiled 3. I wonder about how much time she is spending in the wheelchair versus bed. She is now living with her son in [place]. I explained to her that if there is unrelieved pressure on this area we have no chance of healing this. 4. She is using a protein supplement. 5. I wondered about some degree of bladder distention I will try to check this next time she is here. Might be able to get home health nursing to doing in and out cath 6. We have ordered a hospital bed with a level 2 pressure relief surface 7. Although she has a cushion for her wheelchair I have advised no more than an hour to an hour and a half in the wheelchair perhaps 3 times a dayo For meals or physical therapy Electronic Signature(s)  Signed: 02/28/2021 2:54:56 PM By: Baltazar Najjar MD Entered By: Baltazar Najjar on 02/27/2021 09:49:31 Kimberly Loader ([id number]) -------------------------------------------------------------------------------- ROS/PFSH Details Patient Name: Kimberly Lamb D. Date of Service: 02/27/2021 8:30 AM Medical Record  Number: [id number] Patient Account Number: [id number] Date of Birth/Sex: [birth year] (74 y.o. F) Treating RN: Rogers Blocker Primary Care Provider: Tillman Abide Other Clinician: Lolita Cram Referring Provider: Tillman Abide Treating Provider/Extender: Altamese [place] in Treatment: 0 Information Obtained From Patient Constitutional Symptoms (General Health) Complaints and Symptoms: Negative for: Fatigue; Fever; Chills; Marked Weight Change Eyes Complaints and Symptoms: Negative for: Dry Eyes; Vision Changes; Glasses / Contacts Medical History: Negative for: Cataracts; Glaucoma; Optic Neuritis Ear/Nose/Mouth/Throat Complaints and Symptoms: Negative for: Difficult clearing ears; Sinusitis Medical History: Negative for: Chronic sinus problems/congestion; Middle ear problems Hematologic/Lymphatic Complaints and Symptoms: Negative for: Bleeding / Clotting Disorders; Human Immunodeficiency Virus Medical History: Negative for: Anemia; Hemophilia; Human Immunodeficiency Virus; Lymphedema; Sickle Cell Disease Respiratory Complaints and Symptoms: Negative for: Chronic or frequent coughs; Shortness of Breath Medical History: Negative for: Aspiration; Asthma; Chronic Obstructive Pulmonary Disease (COPD); Pneumothorax; Sleep Apnea; Tuberculosis Cardiovascular Complaints and Symptoms: Negative for: Chest pain; LE edema Medical History: Negative for: Angina; Arrhythmia; Congestive Heart Failure; Coronary Artery Disease; Deep Vein Thrombosis; Hypertension; Hypotension; Myocardial Infarction; Peripheral Arterial Disease; Peripheral Venous Disease; Phlebitis; Vasculitis Gastrointestinal Complaints and Symptoms: Negative for: Frequent diarrhea; Nausea; Vomiting Medical History: Negative for: Cirrhosis ; Colitis; Crohnos; Hepatitis A; Hepatitis B; Hepatitis C Endocrine Complaints and Symptoms: Negative for: Hepatitis; Thyroid disease; Polydypsia (Excessive Thirst) [name], [name] ([id number]) Medical History: Negative for: Type I Diabetes; Type II Diabetes Genitourinary Complaints and Symptoms: Positive for: Incontinence/dribbling Negative for: Kidney failure/ Dialysis Medical History: Negative for: End Stage Renal Disease Immunological Complaints and Symptoms: Negative for: Hives; Itching Medical History: Negative for: Lupus Erythematosus; Raynaudos; Scleroderma Integumentary (Skin) Complaints and Symptoms: Positive for: Wounds Negative for: Bleeding or bruising tendency; Breakdown; Swelling Medical History: Positive for: History of pressure wounds Negative for: History of Burn Musculoskeletal Complaints and Symptoms: Positive for: Muscle Weakness Negative for: Muscle Pain Medical History: Positive for: Osteoarthritis Negative for: Gout; Rheumatoid Arthritis; Osteomyelitis Neurologic Complaints and Symptoms: Negative for: Numbness/parasthesias; Focal/Weakness Medical History: Negative for: Dementia; Neuropathy; Quadriplegia; Paraplegia; Seizure Disorder Past Medical History Notes: MS Psychiatric Complaints and Symptoms: Negative for: Anxiety; Claustrophobia Medical History: Negative for: Anorexia/bulimia; Confinement Anxiety Oncologic Medical History: Negative for: Received Chemotherapy; Received Radiation Immunizations Pneumococcal Vaccine: Received Pneumococcal Vaccination: No Implantable Devices None [name], ERCK ([id number]) Family and Social History Never smoker; Marital Status - Widowed; Alcohol Use: Never; Drug Use: No History; Caffeine Use: Rarely Electronic Signature(s) Signed: 02/27/2021 5:04:06 PM By: Phillis Haggis, Dondra Prader RN Signed: 02/28/2021 2:54:56 PM By: Baltazar Najjar MD Entered By: Phillis Haggis, Dondra Prader on 02/27/2021 09:07:57 Kimberly Loader ([id number]) -------------------------------------------------------------------------------- SuperBill Details Patient Name: Kimberly Lamb D. Date of  Service: 02/27/2021 Medical Record Number: [id number] Patient Account Number: [id number] Date of Birth/Sex: 11-30-1946 (75 y.o. F) Treating RN: Huel Coventry Primary Care Provider: Tillman Abide Other Clinician: Lolita Cram Referring Provider: Tillman Abide Treating Provider/Extender: Altamese [place] in Treatment: 0 Diagnosis Coding ICD-10 Codes Code Description [id number] Pressure ulcer of sacral region, stage 3 G35 Multiple sclerosis Facility Procedures CPT4 Code: 42595638 Description: 99213 - WOUND CARE VISIT-LEV 3 EST PT Modifier: Quantity: 1 CPT4 Code: 75643329 Description: 11042 - DEB SUBQ TISSUE 20 SQ CM/< Modifier: Quantity: 1 CPT4 Code: Description: ICD-10 Diagnosis Description L89.153 Pressure ulcer of sacral region, stage 3 Modifier: Quantity: Physician Procedures CPT4 Code:  8891694 Description: 99204 - WC PHYS LEVEL 4 - NEW PT Modifier: 25 Quantity: 1 CPT4 Code: Description: ICD-10 Diagnosis Description L89.153 Pressure ulcer of sacral region, stage 3 G35 Multiple sclerosis Modifier: Quantity: CPT4 Code: 5038882 Description: 11042 - WC PHYS SUBQ TISS 20 SQ CM Modifier: Quantity: 1 CPT4 Code: Description: ICD-10 Diagnosis Description L89.153 Pressure ulcer of sacral region, stage 3 Modifier: Quantity: Electronic Signature(s) Signed: 02/28/2021 2:54:56 PM By: Baltazar Najjar MD Entered By: Baltazar Najjar on 02/27/2021 09:49:56

## 2021-03-06 ENCOUNTER — Encounter: Payer: Medicare Other | Admitting: Internal Medicine

## 2021-03-06 ENCOUNTER — Other Ambulatory Visit: Payer: Self-pay

## 2021-03-06 ENCOUNTER — Other Ambulatory Visit
Admission: RE | Admit: 2021-03-06 | Discharge: 2021-03-06 | Disposition: A | Discharge status: Home / Self Care | Payer: Medicare Other | Source: Ambulatory Visit | Attending: Internal Medicine | Admitting: Internal Medicine

## 2021-03-06 DIAGNOSIS — L89153 Pressure ulcer of sacral region, stage 3: Secondary | ICD-10-CM | POA: Diagnosis not present

## 2021-03-06 DIAGNOSIS — B999 Unspecified infectious disease: Secondary | ICD-10-CM | POA: Insufficient documentation

## 2021-03-09 LAB — AEROBIC CULTURE W GRAM STAIN (SUPERFICIAL SPECIMEN): Gram Stain: NONE SEEN

## 2021-03-11 NOTE — Progress Notes (Signed)
[name], [name] ([id number]) Visit Report for 03/06/2021 Arrival Information Details Patient Name: [name], [name]. Date of Service: 03/06/2021 8:00 AM Medical Record Number: [id number] Patient Account Number: [id number] Date of Birth/Sex: July 21, 1946 (74 y.o. F) Treating RN: Yevonne Pax Primary Care Altariq Goodall: Tillman Abide Other Clinician: Referring Jennavie Martinek: Tillman Abide Treating Mae Denunzio/Extender: Altamese [place] in Treatment: 1 Visit Information History Since Last Visit All ordered tests and consults were completed: No Patient Arrived: Wheel Chair Added or deleted any medications: No Arrival Time: 08:23 Any new allergies or adverse reactions: No Accompanied By: son Had a fall or experienced change in No Transfer Assistance: None activities of daily living that may affect Patient Identification Verified: Yes risk of falls: Secondary Verification Process Yes Signs or symptoms of abuse/neglect since last visito No Completed: Hospitalized since last visit: No Patient Has Alerts: Yes Implantable device outside of the clinic excluding No Patient Alerts: **ALLERGIC TO cellular tissue based products placed in the center LIDOCAINE** since last visit: Has Dressing in Place as Prescribed: Yes Pain Present Now: No Electronic Signature(s) Signed: 03/11/2021 5:08:01 PM By: Yevonne Pax RN Entered By: Yevonne Pax on 03/06/2021 08:24:20 [name] ([id number]) -------------------------------------------------------------------------------- Clinic Level of Care Assessment Details Patient Name: [name]. Date of Service: 03/06/2021 8:00 AM Medical Record Number: [id number] Patient Account Number: [id number] Date of Birth/Sex: Dec 24, 1945 (74 y.o. F) Treating RN: Huel Coventry Primary Care Taira Knabe: Tillman Abide Other Clinician: Referring Marikay Roads: Tillman Abide Treating Atziri Zubiate/Extender: Altamese [place] in Treatment: 1 Clinic Level of Care  Assessment Items TOOL 4 Quantity Score []  - Use when only an EandM is performed on FOLLOW-UP visit 0 ASSESSMENTS - Nursing Assessment / Reassessment X - Reassessment of Co-morbidities (includes updates in patient status) 1 10 X- 1 5 Reassessment of Adherence to Treatment Plan ASSESSMENTS - Wound and Skin Assessment / Reassessment X - Simple Wound Assessment / Reassessment - one wound 1 5 []  - 0 Complex Wound Assessment / Reassessment - multiple wounds []  - 0 Dermatologic / Skin Assessment (not related to wound area) ASSESSMENTS - Focused Assessment []  - Circumferential Edema Measurements - multi extremities 0 []  - 0 Nutritional Assessment / Counseling / Intervention []  - 0 Lower Extremity Assessment (monofilament, tuning fork, pulses) []  - 0 Peripheral Arterial Disease Assessment (using hand held doppler) ASSESSMENTS - Ostomy and/or Continence Assessment and Care []  - Incontinence Assessment and Management 0 []  - 0 Ostomy Care Assessment and Management (repouching, etc.) PROCESS - Coordination of Care X - Simple Patient / Family Education for ongoing care 1 15 []  - 0 Complex (extensive) Patient / Family Education for ongoing care []  - 0 Staff obtains , Records, Test Results / Process Orders []  - 0 Staff telephones HHA, Nursing Homes / Clarify orders / etc []  - 0 Routine Transfer to another Facility (non-emergent condition) []  - 0 Routine Hospital Admission (non-emergent condition) []  - 0 New Admissions / / Ordering NPWT, Apligraf, etc. []  - 0 Emergency Hospital Admission (emergent condition) X- 1 10 Simple Discharge Coordination []  - 0 Complex (extensive) Discharge Coordination PROCESS - Special Needs []  - Pediatric / Minor Patient Management 0 []  - 0 Isolation Patient Management []  - 0 Hearing / Language / Visual special needs []  - 0 Assessment of Community assistance (transportation, Collins/C planning, etc.) []  - 0 Additional  assistance / Altered mentation []  - 0 Support Surface(s) Assessment (bed, cushion, seat, etc.) INTERVENTIONS - Wound Cleansing / Measurement [name], [name]. ( ) X- 1 5 Simple Wound  Cleansing - one wound []  - 0 Complex Wound Cleansing - multiple wounds X- 1 5 Wound Imaging (photographs - any number of wounds) []  - 0 Wound Tracing (instead of photographs) X- 1 5 Simple Wound Measurement - one wound []  - 0 Complex Wound Measurement - multiple wounds INTERVENTIONS - Wound Dressings []  - Small Wound Dressing one or multiple wounds 0 X- 1 15 Medium Wound Dressing one or multiple wounds []  - 0 Large Wound Dressing one or multiple wounds []  - 0 Application of Medications - topical []  - 0 Application of Medications - injection INTERVENTIONS - Miscellaneous []  - External ear exam 0 X- 1 5 Specimen Collection (cultures, biopsies, blood, body fluids, etc.) X- 1 5 Specimen(s) / Culture(s) sent or taken to Lab for analysis []  - 0 Patient Transfer (multiple staff / / Similar devices) []  - 0 Simple Staple / Suture removal (25 or less) []  - 0 Complex Staple / Suture removal (26 or more) []  - 0 Hypo / Hyperglycemic Management (close monitor of Blood Glucose) []  - 0 Ankle / Brachial Index (ABI) - do not check if billed separately X- 1 5 Vital Signs Has the patient been seen at the hospital within the last three years: Yes Total Score: 90 Level Of Care: New/Established - Level 3 Electronic Signature(s) Signed: 03/06/2021 6:23:16 PM By: , BSN, RN, CWS, Kim RN, BSN Entered By: , BSN, RN, CWS, Kim on 03/06/2021 08:48:09 ( ) -------------------------------------------------------------------------------- Encounter Discharge Information Details Patient Name: Collins. Date of Service: 03/06/2021 8:00 AM Medical Record Number: Nurse, adult Patient Account Number: Date of Birth/Sex: 1946-02-23 (74 y.o.  F) Treating RN: Primary Care Fara Worthy: 03/08/2021 Other Clinician: Referring Arthelia Callicott: Elliot Gurney Treating Danaysha Kirn/Extender: Elliot Gurney in Treatment: 1 Encounter Discharge Information Items Discharge Condition: Stable Ambulatory Status: Wheelchair Discharge Destination: Home Transportation: Private Auto Accompanied By: son Schedule Follow-up Appointment: Yes Clinical Summary of Care: Electronic Signature(s) Signed: 03/06/2021 4:40:52 PM By: [name], [id number] RN Entered By: Kimberly Lamb, 03/08/2021 on 03/06/2021 08:59:23 [id number] (Kimberly) -------------------------------------------------------------------------------- Lower Extremity Assessment Details Patient Name: [name]. Date of Service: 03/06/2021 8:00 AM Medical Record Number: Tillman Abide Patient Account Number: Tillman Abide Date of Birth/Sex: 04-Dec-1946 (74 y.o. F) Treating RN: Phillis Haggis Primary Care Rishikesh Khachatryan: Dondra Prader Other Clinician: Referring Denna Fryberger: Phillis Haggis Treating Ritamarie Arkin/Extender: Dondra Prader in Treatment: 1 Electronic Signature(s) Signed: 03/11/2021 5:08:01 PM By: Kimberly Loader RN Entered By: [id number] on 03/06/2021 03/08/2021 [id number] ([id number]) -------------------------------------------------------------------------------- Multi Wound Chart Details Patient Name: [name]. Date of Service: 03/06/2021 8:00 AM Medical Record Number: Yevonne Pax Patient Account Number: Tillman Abide Date of Birth/Sex: 08-22-1946 (74 y.o. F) Treating RN: 03/13/2021 Primary Care Nykeem Citro: Yevonne Pax Other Clinician: Referring Shermaine Brigham: Yevonne Pax Treating Blessing Zaucha/Extender: 03/08/2021 in Treatment: 1 Vital Signs Height(in): 63 Pulse(bpm): 79 Weight(lbs): 120 Blood Pressure(mmHg): 122/77 Body Mass Index(BMI): 21 Temperature(F): 98.3 Respiratory Rate(breaths/min): 16 Photos: [N/A:N/A] Wound  Location: Sacrum N/A N/A Wounding Event: Pressure Injury N/A N/A Primary Etiology: Pressure Ulcer N/A N/A Comorbid History: History of pressure wounds, N/A N/A Osteoarthritis Date Acquired: 02/12/2021 N/A N/A Weeks of Treatment: 1 N/A N/A Wound Status: Open N/A N/A Measurements L x W x Collins (cm) 2x1x1.7 N/A N/A Area (cm) : 1.571 N/A N/A Volume (cm) : 2.67 N/A N/A % Reduction in Area: 33.30% N/A N/A % Reduction in Volume: -13.30% N/A N/A Starting Position 1 (o'clock): 12 Ending Position 1 (o'clock): 1  Maximum Distance 1 (cm): 1.5 Undermining: Yes N/A N/A Classification: Category/Stage III N/A N/A Exudate Amount: Medium N/A N/A Exudate Type: Serosanguineous N/A N/A Exudate Color: red, brown N/A N/A Wound Margin: Flat and Intact N/A N/A Granulation Amount: Medium (34-66%) N/A N/A Granulation Quality: Red N/A N/A Necrotic Amount: Medium (34-66%) N/A N/A Exposed Structures: Fat Layer (Subcutaneous Tissue): N/A N/A Yes Fascia: No Tendon: No Muscle: No Joint: No Bone: No Epithelialization: None N/A N/A Treatment Notes Electronic Signature(s) Signed: 03/06/2021 5:15:48 PM By: Baltazar Najjar MD Entered By: Baltazar Najjar on 03/06/2021 08:52:42 [name] ([id number]) [name], Kenney Houseman ([id number]) -------------------------------------------------------------------------------- Multi-Disciplinary Care Plan Details Patient Name: [name]. Date of Service: 03/06/2021 8:00 AM Medical Record Number: [id number] Patient Account Number: [id number] Date of Birth/Sex: Jan 07, 1946 (74 y.o. F) Treating RN: Huel Coventry Primary Care Beauden Tremont: Tillman Abide Other Clinician: Referring Grant Henkes: Tillman Abide Treating Crespin Forstrom/Extender: Altamese [place] in Treatment: 1 Active Inactive Necrotic Tissue Nursing Diagnoses: Impaired tissue integrity related to necrotic/devitalized tissue Knowledge deficit related to management of necrotic/devitalized  tissue Goals: Necrotic/devitalized tissue will be minimized in the wound bed Date Initiated: 02/27/2021 Target Resolution Date: 03/13/2021 Goal Status: Active Interventions: Assess patient pain level pre-, during and post procedure and prior to discharge Treatment Activities: Excisional debridement : 02/27/2021 Notes: Orientation to the Wound Care Program Nursing Diagnoses: Knowledge deficit related to the wound healing center program Goals: Patient/caregiver will verbalize understanding of the Wound Healing Center Program Date Initiated: 02/27/2021 Target Resolution Date: 03/13/2021 Goal Status: Active Interventions: Provide education on orientation to the wound center Notes: Pressure Nursing Diagnoses: Potential for impaired tissue integrity related to pressure, friction, moisture, and shear Goals: Patient will remain free from development of additional pressure ulcers Date Initiated: 02/27/2021 Target Resolution Date: 03/13/2021 Goal Status: Active Interventions: Assess: immobility, friction, shearing, incontinence upon admission and as needed Provide education on pressure ulcers Notes: Wound/Skin Impairment Nursing Diagnoses: Impaired tissue integrity [name], [name] ([id number]) Goals: Patient/caregiver will verbalize understanding of skin care regimen Date Initiated: 02/27/2021 Target Resolution Date: 03/13/2021 Goal Status: Active Ulcer/skin breakdown will have a volume reduction of 30% by week 4 Date Initiated: 02/27/2021 Target Resolution Date: 03/23/2021 Goal Status: Active Interventions: Assess ulceration(s) every visit Treatment Activities: Skin care regimen initiated : 02/27/2021 Topical wound management initiated : 02/27/2021 Notes: Electronic Signature(s) Signed: 03/06/2021 6:23:16 PM By: Elliot Gurney, BSN, RN, CWS, Kim RN, BSN Entered By: Elliot Gurney, BSN, RN, CWS, Kim on 03/06/2021 08:36:34 [name]  ([id number]) -------------------------------------------------------------------------------- Pain Assessment Details Patient Name: [name]. Date of Service: 03/06/2021 8:00 AM Medical Record Number: [id number] Patient Account Number: [id number] Date of Birth/Sex: 03-05-1946 (74 y.o. F) Treating RN: Yevonne Pax Primary Care Leonidas Boateng: Tillman Abide Other Clinician: Referring Matilyn Fehrman: Tillman Abide Treating Allan Minotti/Extender: Altamese [place] in Treatment: 1 Active Problems Location of Pain Severity and Description of Pain Patient Has Paino No Site Locations Pain Management and Medication Current Pain Management: Electronic Signature(s) Signed: 03/11/2021 5:08:01 PM By: Yevonne Pax RN Entered By: Yevonne Pax on 03/06/2021 08:24:50 [name] ([id number]) -------------------------------------------------------------------------------- Patient/Caregiver Education Details Patient Name: [name]. Date of Service: 03/06/2021 8:00 AM Medical Record Number: [id number] Patient Account Number: [id number] Date of Birth/Gender: Jun 09, 1946 (75 y.o. F) Treating RN: Huel Coventry Primary Care Physician: Tillman Abide Other Clinician: Referring Physician: Tillman Abide Treating Physician/Extender: Altamese [place] in Treatment: 1 Education Assessment Education Provided To: Patient Education Topics Provided Nutrition: Handouts: Nutrition Methods: Demonstration, Explain/Verbal Responses: State content correctly Pressure: Handouts: Pressure Ulcers: Care  and Offloading Methods: Demonstration, Explain/Verbal Responses: State content correctly Wound/Skin Impairment: Handouts: Caring for Your Ulcer, Other: continue wound care as prescribed Methods: Demonstration, Explain/Verbal Responses: State content correctly Electronic Signature(s) Signed: 03/06/2021 6:23:16 PM By: Elliot Gurney, BSN, RN, CWS, Kim RN, BSN Entered By: Elliot Gurney, BSN, RN, CWS, Kim on  03/06/2021 08:49:34 [name] ([id number]) -------------------------------------------------------------------------------- Wound Assessment Details Patient Name: [name]. Date of Service: 03/06/2021 8:00 AM Medical Record Number: [id number] Patient Account Number: [id number] Date of Birth/Sex: 09-Dec-1946 (74 y.o. F) Treating RN: Yevonne Pax Primary Care Hawley Pavia: Tillman Abide Other Clinician: Referring Tempie Gibeault: Tillman Abide Treating Santo Zahradnik/Extender: Altamese [place] in Treatment: 1 Wound Status Wound Number: 1 Primary Etiology: Pressure Ulcer Wound Location: Sacrum Wound Status: Open Wounding Event: Pressure Injury Comorbid History: History of pressure wounds, Osteoarthritis Date Acquired: 02/12/2021 Weeks Of Treatment: 1 Clustered Wound: No Photos Wound Measurements Length: (cm) 2 % Red Width: (cm) 1 % Red Depth: (cm) 1.7 Epith Area: (cm) 1.571 Tunn Volume: (cm) 2.67 Un[address] St En Ma uction in Area: 33.3% uction in Volume: -13.3% elialization: None eling: No rmining: Yes arting Position (o'clock): 12 ding Position (o'clock): 1 ximum Distance: (cm) 1.5 Wound Description Classification: Category/Stage III Foul Wound Margin: Flat and Intact Slou Exudate Amount: Medium Exudate Type: Serosanguineous Exudate Color: red, brown Odor After Cleansing: No gh/Fibrino Yes Wound Bed Granulation Amount: Medium (34-66%) Exposed Structure Granulation Quality: Red Fascia Exposed: No Necrotic Amount: Medium (34-66%) Fat Layer (Subcutaneous Tissue) Exposed: Yes Necrotic Quality: Adherent Slough Tendon Exposed: No Muscle Exposed: No Joint Exposed: No Bone Exposed: No Treatment Notes Wound #1 (Sacrum) Cleanser [name], [name] ([id number]) Soap and Water Discharge Instruction: Gently cleanse wound with antibacterial soap, rinse and pat dry prior to dressing wounds Peri-Wound Care Topical Primary Dressing Prisma 4.34 (in) Discharge  Instruction: Moisten w/normal saline and pack lightly into tunnel from 12-1 Secondary Dressing Mepilex Border Flex, 4x4 (in/in) Discharge Instruction: Apply to wound as directed. Do not cut. Secured With Compression Wrap Compression Stockings Facilities manager) Signed: 03/06/2021 6:23:16 PM By: Elliot Gurney, BSN, RN, CWS, Kim RN, BSN Signed: 03/11/2021 5:08:01 PM By: Yevonne Pax RN Entered By: Elliot Gurney, BSN, RN, CWS, Kim on 03/06/2021 08:41:37 [name] ([id number]) -------------------------------------------------------------------------------- Vitals Details Patient Name: [name]. Date of Service: 03/06/2021 8:00 AM Medical Record Number: [id number] Patient Account Number: [id number] Date of Birth/Sex: 1946-12-05 (74 y.o. F) Treating RN: Yevonne Pax Primary Care Jan Olano: Tillman Abide Other Clinician: Referring Ashya Nicolaisen: Tillman Abide Treating Ceriah Kohler/Extender: Altamese [place] in Treatment: 1 Vital Signs Time Taken: 08:24 Temperature (F): 98.3 Height (in): 63 Pulse (bpm): 79 Weight (lbs): 120 Respiratory Rate (breaths/min): 16 Body Mass Index (BMI): 21.3 Blood Pressure (mmHg): 122/77 Reference Range: 80 - 120 mg / dl Electronic Signature(s) Signed: 03/11/2021 5:08:01 PM By: Yevonne Pax RN Entered By: Yevonne Pax on 03/06/2021 08:24:42

## 2021-03-12 ENCOUNTER — Telehealth: Payer: Self-pay

## 2021-03-12 NOTE — Telephone Encounter (Signed)
Kreg Shropshire PT with [facility] left v/m requesting to put [facility] PT on hold until end of next month(April). pts wounds on buttock prohibits pt from sitting at all; pts caregiver can perform lower extremity range of motion. Jae Dire request cb.

## 2021-03-13 ENCOUNTER — Encounter: Payer: Medicare Other | Admitting: Internal Medicine

## 2021-03-13 ENCOUNTER — Other Ambulatory Visit: Payer: Self-pay

## 2021-03-13 DIAGNOSIS — L89153 Pressure ulcer of sacral region, stage 3: Secondary | ICD-10-CM | POA: Diagnosis not present

## 2021-03-13 NOTE — Telephone Encounter (Signed)
Spoke to [employer] He said a home visit in about 3 weeks would be fine.

## 2021-03-13 NOTE — Telephone Encounter (Signed)
noted 

## 2021-03-13 NOTE — Telephone Encounter (Signed)
Okay Sorry to hear the wound is not doing well.  Please offer to her again that I can make a home visit---I am not sure how else I can check on her

## 2021-03-14 NOTE — Progress Notes (Signed)
[name], [name] ([id number]) Visit Report for 03/06/2021 HPI Details Patient Name: [name], [name]. Date of Service: 03/06/2021 8:00 AM Medical Record Number: [id number] Patient Account Number: [id number] Date of Birth/Sex: [birth year] (75 y.o. F) Treating RN: Huel Coventry Primary Care Provider: Tillman Abide Other Clinician: Referring Provider: Tillman Abide Treating Provider/Extender: Altamese [place] in Treatment: 1 History of Present Illness HPI Description: Admission 02/27/2021 This woman is a 75 year old woman with fairly advanced multiple sclerosis that was diagnosed in 1982 follows with neurology. In October of last year she had an ORIF of a tib-fib fracture and apparently a shoulder replacement. She went to [facility] for rehabilitation is just come home 2 weeks ago. Unbeknownst to the patient or her son they noticed a wound on her sacrum. Her son is able to show me pictures on the phone generally small wound slightly bigger than a dime however completely necrotic surface. They are not exactly sure what they are using on this wound. There is a note from primary care I think a telephone conversation that they sent in [place] on 02/15/2021 although both the patient and her son think it is Sylvant which is something I am really not familiar with. They said it was $1300 at [facility] they ended up paying $300 after insurance. Even for Santyl this would be expense Also the patient has come home a lot more physically challenged. 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 This will make it difficult to keep the pressure off this area. They have [facility] and they are getting a physical therapist out. Past medical history includes; multiple sclerosis ankle fractures, disc herniation, mitral valve prolapse, nephrolithiasis, osteoporosis, rib fractures. She has significant urinary incontinence 3/23; deep stage  III wound over the sacrum. We have been using silver collagen. As it turns out the ointment that you are using was Santyl. I told him not to shoot this out as we may need to go back to this. It sounds like they are religiously offloading this only up 1 hour in her chair for meals. Also careful offloading in the wheelchair. Electronic Signature(s) Signed: 03/06/2021 5:15:48 PM By: Baltazar Najjar MD Entered By: Baltazar Najjar on 03/06/2021 08:53:40 [name] ([id number]) -------------------------------------------------------------------------------- Physical Exam Details Patient Name: Kimberly Lamb D. Date of Service: 03/06/2021 8:00 AM Medical Record Number: [id number] Patient Account Number: [id number] Date of Birth/Sex: 1946-10-05 (75 y.o. F) Treating RN: Huel Coventry Primary Care Provider: Tillman Abide Other Clinician: Referring Provider: Tillman Abide Treating Provider/Extender: Altamese [place] in Treatment: 1 Constitutional Sitting or standing Blood Pressure is within target range for patient.. Pulse regular and within target range for patient.Marland Kitchen Respirations regular, non- labored and within target range.. Temperature is normal and within the target range for the patient.Marland Kitchen appears in no distress. Notes Wound exam; lower sacrum wound I had to debride aggressively last week. Apparently although we did identify depth we did not identify undermining at 12:00 which I measured today at 1.5 cm. Furthermore some degree of purulence which I have cultured. There is no infection around the wound no crepitus. As was true last week this wound does not probe to bone Electronic Signature(s) Signed: 03/06/2021 5:15:48 PM By: Baltazar Najjar MD Entered By: Baltazar Najjar on 03/06/2021 09:05:53 [name] ([id number]) -------------------------------------------------------------------------------- Physician Orders Details Patient Name: Kimberly Lamb D. Date of Service:  03/06/2021 8:00 AM Medical Record Number: [id number] Patient Account Number: [id number] Date of Birth/Sex:  Aug 10, 1946 (75 y.o. F) Treating RN: Huel Coventry Primary Care Provider: Tillman Abide Other Clinician: Referring Provider: Tillman Abide Treating Provider/Extender: Altamese [place] in Treatment: 1 Verbal / Phone Orders: No Diagnosis Coding Follow-up Appointments o Return Appointment in 1 week. Home Health o Home Health Company: - [facility] o [facility] for wound care. May utilize formulary equivalent dressing for wound treatment orders unless otherwise specified. Home Health Nurse may visit PRN to address patientos wound care needs. - Wound Care Orders o Scheduled days for dressing changes to be completed; exception, patient has scheduled wound care visit that day. o **Please direct any NON-WOUND related issues/requests for orders to patient's Primary Care Physician. **If current dressing causes regression in wound condition, may D/C ordered dressing product/s and apply Normal Saline Moist Dressing daily until next Wound Healing Center or Other MD appointment. **Notify Wound Healing Center of regression in wound condition at [phone]. Bathing/ Shower/ Hygiene o May shower; gently cleanse wound with antibacterial soap, rinse and pat dry prior to dressing wounds Off-Loading o Hospital bed/mattress o Low air-loss mattress (Group 2) o Turn and reposition every 2 hours Additional Orders / Instructions o Follow Nutritious Diet and Increase Protein Intake Wound Treatment Wound #1 - Sacrum Cleanser: Soap and Water 3 x Per Week/30 Days Discharge Instructions: Gently cleanse wound with antibacterial soap, rinse and pat dry prior to dressing wounds Primary Dressing: Prisma 4.34 (in) (Home Health) 3 x Per Week/30 Days Discharge Instructions: Moisten w/normal saline and pack lightly into tunnel from 12-1 Secondary Dressing: Mepilex Border Flex, 4x4  (in/in) (Home Health) 3 x Per Week/30 Days Discharge Instructions: Apply to wound as directed. Do not cut. Laboratory o Bacteria identified in Wound by Culture (MICRO) - sacrum oooo LOINC Code: 6462-6 oooo Convenience Name: Wound culture routine Electronic Signature(s) Signed: 03/06/2021 6:23:16 PM By: Elliot Gurney, BSN, RN, CWS, Kim RN, BSN Signed: 03/14/2021 8:16:57 AM By: Baltazar Najjar MD Previous Signature: 03/06/2021 5:15:48 PM Version By: Baltazar Najjar MD Entered By: Elliot Gurney, BSN, RN, CWS, Kim on 03/06/2021 17:14:53 [name] ([id number]) -------------------------------------------------------------------------------- Problem List Details Patient Name: CHANDA, LAPERLE. Date of Service: 03/06/2021 8:00 AM Medical Record Number: [id number] Patient Account Number: [id number] Date of Birth/Sex: 22-Jan-1946 (75 y.o. F) Treating RN: Huel Coventry Primary Care Provider: Tillman Abide Other Clinician: Referring Provider: Tillman Abide Treating Provider/Extender: Altamese [place] in Treatment: 1 Active Problems ICD-10 Encounter Code Description Active Date MDM Diagnosis L89.153 Pressure ulcer of sacral region, stage 3 02/27/2021 No Yes G35 Multiple sclerosis 02/27/2021 No Yes Inactive Problems Resolved Problems Electronic Signature(s) Signed: 03/06/2021 5:15:48 PM By: Baltazar Najjar MD Entered By: Baltazar Najjar on 03/06/2021 08:52:22 [name] ([id number]) -------------------------------------------------------------------------------- Progress Note Details Patient Name: Kimberly Lamb D. Date of Service: 03/06/2021 8:00 AM Medical Record Number: [id number] Patient Account Number: [id number] Date of Birth/Sex: January 19, 1946 (75 y.o. F) Treating RN: Huel Coventry Primary Care Provider: Tillman Abide Other Clinician: Referring Provider: Tillman Abide Treating Provider/Extender: Altamese [place] in Treatment: 1 Subjective History of Present Illness  (HPI) Admission 02/27/2021 This woman is a 75 year old woman with fairly advanced multiple sclerosis that was diagnosed in 1982 follows with neurology. In October of last year she had an ORIF of a tib-fib fracture and apparently a shoulder replacement. She went to [facility] for rehabilitation is just come home 2 weeks ago. Unbeknownst to the patient or her son they noticed a wound on her sacrum. Her son is able to show me pictures on the  phone generally small wound slightly bigger than a dime however completely necrotic surface. They are not exactly sure what they are using on this wound. There is a note from primary care I think a telephone conversation that they sent in [place] on 02/15/2021 although both the patient and her son think it is Sylvant which is something I am really not familiar with. They said it was $1300 at [facility] they ended up paying $300 after insurance. Even for Santyl this would be expense Also the patient has come home a lot more physically challenged. 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 This will make it difficult to keep the pressure off this area. They have [facility] and they are getting a physical therapist out. Past medical history includes; multiple sclerosis ankle fractures, disc herniation, mitral valve prolapse, nephrolithiasis, osteoporosis, rib fractures. 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 We have been using silver collagen. As it turns out the ointment that you are using was Santyl. I told him not to shoot this out as we may need to go back to this. It sounds like they are religiously offloading this only up 1 hour in her chair for meals. Also careful offloading in the wheelchair. Objective Constitutional Sitting or standing Blood Pressure is within target range for patient.. Pulse regular and within target range for patient.Marland Kitchen  Respirations regular, non- labored and within target range.. Temperature is normal and within the target range for the patient.Marland Kitchen appears in no distress. Vitals Time Taken: 8:24 AM, Height: 63 in, Weight: 120 lbs, BMI: 21.3, Temperature: 98.3 F, Pulse: 79 bpm, Respiratory Rate: 16 breaths/min, Blood Pressure: 122/77 mmHg. General Notes: Wound exam; lower sacrum wound I had to debride aggressively last week. Apparently although we did identify depth we did not identify undermining at 12:00 which I measured today at 1.5 cm. Furthermore some degree of purulence which I have cultured. There is no infection around the wound no crepitus. As was true last week this wound does not probe to bone Integumentary (Hair, Skin) Wound #1 status is Open. Original cause of wound was Pressure Injury. The date acquired was: 02/12/2021. The wound has been in treatment 1 weeks. The wound is located on the Sacrum. The wound measures 2cm length x 1cm width x 1.7cm depth; 1.571cm^2 area and 2.67cm^3 volume. There is Fat Layer (Subcutaneous Tissue) exposed. There is no tunneling noted, however, there is undermining starting at 12:00 and ending at 1:00 with a maximum distance of 1.5cm. There is a medium amount of serosanguineous drainage noted. The wound margin is flat and intact. There is medium (34-66%) red granulation within the wound bed. There is a medium (34-66%) amount of necrotic tissue within the wound bed including Adherent Slough. Assessment Active Problems ICD-10 Pressure ulcer of sacral region, stage 3 Multiple sclerosis [name], [name] ([id number]) Plan Follow-up Appointments: Return Appointment in 1 week. Home Health: Home Health Company: - [facility] for wound care. May utilize formulary equivalent dressing for wound treatment orders unless otherwise specified. Home Health Nurse may visit PRN to address patient s wound care needs. - Wound Care Orders Scheduled days for dressing  changes to be completed; exception, patient has scheduled wound care visit that day. **Please direct any NON-WOUND related issues/requests for orders to patient's Primary Care Physician. **If current dressing causes regression in wound condition, may D/C ordered dressing product/s and apply Normal Saline Moist Dressing  daily until next Wound Healing Center or Other MD appointment. **Notify Wound Healing Center of regression in wound condition at [phone]. Bathing/ Shower/ Hygiene: May shower; gently cleanse wound with antibacterial soap, rinse and pat dry prior to dressing wounds Off-Loading: Hospital bed/mattress Low air-loss mattress (Group 2) Turn and reposition every 2 hours Additional Orders / Instructions: Follow Nutritious Diet and Increase Protein Intake Laboratory ordered were: Wound culture routine - sacrum WOUND #1: - Sacrum Wound Laterality: Cleanser: Soap and Water 3 x Per Week/30 Days Discharge Instructions: Gently cleanse wound with antibacterial soap, rinse and pat dry prior to dressing wounds Primary Dressing: Prisma 4.34 (in) (Home Health) 3 x Per Week/30 Days Discharge Instructions: Moisten w/normal saline and pack lightly into tunnel from 12-1 Secondary Dressing: Mepilex Border Flex, 4x4 (in/in) (Home Health) 3 x Per Week/30 Days Discharge Instructions: Apply to wound as directed. Do not cut. #1 for now I continued with the silver collagen. She has some degree of healthy looking granulation although at the base of this wound and undermining there is still debris. No debridement today after last week's aggressive debridement 2. I had a long conversation with the patient who appears somewhat despondent about the lack of progress. I emphasized that this will be a process that will take time. She needs to offload this, protein supplements, etc. It sounds as though she is doing all of this Electronic Signature(s) Signed: 03/06/2021 5:15:08 PM By: Elliot Gurney, BSN, RN, CWS, Kim  RN, BSN Signed: 03/14/2021 8:16:57 AM By: Baltazar Najjar MD Previous Signature: 03/06/2021 5:15:48 PM Version By: Baltazar Najjar MD Entered By: Elliot Gurney, BSN, RN, CWS, Kim on 03/06/2021 17:15:08 [name] ([id number]) -------------------------------------------------------------------------------- SuperBill Details Patient Name: Kimberly Lamb D. Date of Service: 03/06/2021 Medical Record Number: [id number] Patient Account Number: [id number] Date of Birth/Sex: [birth year] (75 y.o. F) Treating RN: Huel Coventry Primary Care Provider: Tillman Abide Other Clinician: Referring Provider: Tillman Abide Treating Provider/Extender: Altamese [place] in Treatment: 1 Diagnosis Coding ICD-10 Codes Code Description [phone] Pressure ulcer of sacral region, stage 3 G35 Multiple sclerosis Facility Procedures CPT4 Code: [id number] Description: 99213 - WOUND CARE VISIT-LEV 3 EST PT Modifier: Quantity: 1 Physician Procedures CPT4 Code: 1224825 Description: 99214 - WC PHYS LEVEL 4 - EST PT Modifier: Quantity: 1 CPT4 Code: Description: ICD-10 Diagnosis Description L89.153 Pressure ulcer of sacral region, stage 3 G35 Multiple sclerosis Modifier: Quantity: Electronic Signature(s) Signed: 03/06/2021 5:15:48 PM By: Baltazar Najjar MD Entered By: Baltazar Najjar on 03/06/2021 09:07:24

## 2021-03-14 NOTE — Progress Notes (Signed)
[name], [name] ([id number]) Visit Report for 03/13/2021 Debridement Details Patient Name: [name], [name]. Date of Service: 03/13/2021 8:00 AM Medical Record Number: [id number] Patient Account Number: [id number] Date of Birth/Sex: Nov 15, 1946 (74 y.o. F) Treating RN: Huel Coventry Primary Care Provider: Tillman Abide Other Clinician: Lolita Cram Referring Provider: Tillman Abide Treating Provider/Extender: Altamese [place] in Treatment: 2 Debridement Performed for Wound #1 Sacrum Assessment: Performed By: Physician Maxwell Caul, MD Debridement Type: Debridement Level of Consciousness (Pre- Awake and Alert procedure): Pre-procedure Verification/Time Out Yes - 08:40 Taken: Pain Control: Other : Painease Total Area Debrided (L x W): 1.5 (cm) x 0.5 (cm) = 0.75 (cm) Tissue and other material Viable, Non-Viable, Slough, Subcutaneous, Slough debrided: Level: Skin/Subcutaneous Tissue Debridement Description: Excisional Instrument: Curette Bleeding: Moderate Hemostasis Achieved: Pressure Response to Treatment: Procedure was tolerated well Level of Consciousness (Post- Awake and Alert procedure): Post Debridement Measurements of Total Wound Length: (cm) 1.5 Stage: Category/Stage III Width: (cm) 0.5 Depth: (cm) 1.8 Volume: (cm) 1.06 Character of Wound/Ulcer Post Debridement: Stable Post Procedure Diagnosis Same as Pre-procedure Electronic Signature(s) Signed: 03/13/2021 1:17:06 PM By: Elliot Gurney, BSN, RN, CWS, Kim RN, BSN Signed: 03/14/2021 8:16:57 AM By: Baltazar Najjar MD Entered By: Baltazar Najjar on 03/13/2021 09:06:20 Arsenio Loader ([id number]) -------------------------------------------------------------------------------- HPI Details Patient Name: [name] D. Date of Service: 03/13/2021 8:00 AM Medical Record Number: [id number] Patient Account Number: [id number] Date of Birth/Sex: 21-Oct-1946 (74 y.o. F) Treating RN: Huel Coventry Primary Care  Provider: Tillman Abide Other Clinician: Lolita Cram Referring Provider: Tillman Abide Treating Provider/Extender: Altamese [place] in Treatment: 2 History of Present Illness HPI Description: Admission 02/27/2021 This woman is a 75 year old woman with fairly advanced multiple sclerosis that was diagnosed in 1982 follows with neurology. In October of last year she had an ORIF of a tib-fib fracture and apparently a shoulder replacement. She went to [facility] for rehabilitation is just come home 2 weeks ago. Unbeknownst to the patient or her son they noticed a wound on her sacrum. Her son is able to show me pictures on the phone generally small wound slightly bigger than a dime however completely necrotic surface. They are not exactly sure what they are using on this wound. There is a note from primary care I think a telephone conversation that they sent in [place] on 02/15/2021 although both the patient and her son think it is Sylvant which is something I am really not familiar with. They said it was $1300 at [facility] they ended up paying $300 after insurance. Even for Santyl this would be expense Also the patient has come home a lot more physically challenged. 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 This will make it difficult to keep the pressure off this area. They have [facility] and they are getting a physical therapist out. Past medical history includes; multiple sclerosis ankle fractures, disc herniation, mitral valve prolapse, nephrolithiasis, osteoporosis, rib fractures. 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 We have been using silver collagen. As it turns out the ointment that you are using was Santyl. I told him not to shoot this out as we may need to go back to this. It sounds like they are religiously offloading this only up 1 hour in her chair  for meals. Also careful offloading in the wheelchair. 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 We have been using silver collagen after she  came in on [place]. This is a deep wound and there is undermining at the base that is difficult to measure because of the small surface area. Still using silver collagen. We are going to explore a snap VAC and/or Oasis through insurance. Electronic Signature(s) Signed: 03/14/2021 8:16:57 AM By: Baltazar Najjar MD Entered By: Baltazar Najjar on 03/13/2021 09:07:27 Arsenio Loader ([id number]) -------------------------------------------------------------------------------- Physical Exam Details Patient Name: [name] D. Date of Service: 03/13/2021 8:00 AM Medical Record Number: [id number] Patient Account Number: [id number] Date of Birth/Sex: 11-12-1946 (74 y.o. F) Treating RN: Huel Coventry Primary Care Provider: Tillman Abide Other Clinician: Lolita Cram Referring Provider: Tillman Abide Treating Provider/Extender: Altamese [place] in Treatment: 2 Constitutional Sitting or standing Blood Pressure is within target range for patient.. Pulse regular and within target range for patient.Marland Kitchen Respirations regular, non- labored and within target range.. Temperature is normal and within the target range for the patient.Marland Kitchen appears in no distress. Notes Wound exam; lower sacrum. The wound is definitely come in in terms of its surface area with surrounding nice granulation however this wound is deep with undermining at the base. I do not know that it goes to bone perhaps periosteum it is difficult to tell and almost impossible to visualize because of the small surface area. I used a #3 curette to remove debris from the wound surface is much as I can see. Hemostasis with direct pressure. There is no surrounding erythema and no obvious infection Electronic Signature(s) Signed: 03/14/2021 8:16:57 AM By: Baltazar Najjar MD Entered By: Baltazar Najjar  on 03/13/2021 09:08:33 Arsenio Loader ([id number]) -------------------------------------------------------------------------------- Physician Orders Details Patient Name: [name] D. Date of Service: 03/13/2021 8:00 AM Medical Record Number: [id number] Patient Account Number: [id number] Date of Birth/Sex: 1946-05-29 (74 y.o. F) Treating RN: Huel Coventry Primary Care Provider: Tillman Abide Other Clinician: Lolita Cram Referring Provider: Tillman Abide Treating Provider/Extender: Altamese [place] in Treatment: 2 Verbal / Phone Orders: No Diagnosis Coding Follow-up Appointments o Return Appointment in 1 week. Home Health o Home Health Company: - [facility] o [facility] for wound care. May utilize formulary equivalent dressing for wound treatment orders unless otherwise specified. Home Health Nurse may visit PRN to address patientos wound care needs. - Wound Care Orders o Scheduled days for dressing changes to be completed; exception, patient has scheduled wound care visit that day. o **Please direct any NON-WOUND related issues/requests for orders to patient's Primary Care Physician. **If current dressing causes regression in wound condition, may D/C ordered dressing product/s and apply Normal Saline Moist Dressing daily until next Wound Healing Center or Other MD appointment. **Notify Wound Healing Center of regression in wound condition at [phone]. Bathing/ Shower/ Hygiene o May shower; gently cleanse wound with antibacterial soap, rinse and pat dry prior to dressing wounds Off-Loading o Hospital bed/mattress o Low air-loss mattress (Group 2) o Turn and reposition every 2 hours Additional Orders / Instructions o Follow Nutritious Diet and Increase Protein Intake Wound Treatment Wound #1 - Sacrum Cleanser: Soap and Water 3 x Per Week/30 Days Discharge Instructions: Gently cleanse wound with antibacterial soap, rinse and pat dry  prior to dressing wounds Primary Dressing: Prisma 4.34 (in) (Home Health) 3 x Per Week/30 Days Discharge Instructions: Moisten w/normal saline and pack lightly into tunnel from 12-1 Secondary Dressing: Mepilex Border Flex, 4x4 (in/in) (Home Health) 3 x Per Week/30 Days Discharge Instructions: Apply to wound as directed. Do not cut. Notes VOB Snap Vac; VOB Oasis [employer]) Signed: 03/13/2021 1:17:06  PM By: Elliot Gurney, BSN, RN, CWS, Kim RN, BSN Signed: 03/14/2021 8:16:57 AM By: Baltazar Najjar MD Entered By: Elliot Gurney, BSN, RN, CWS, Kim on 03/13/2021 08:49:24 Arsenio Loader ([id number]) -------------------------------------------------------------------------------- Problem List Details Patient Name: ADELISA, SATTERWHITE. Date of Service: 03/13/2021 8:00 AM Medical Record Number: [id number] Patient Account Number: [id number] Date of Birth/Sex: September 27, 1946 (74 y.o. F) Treating RN: Huel Coventry Primary Care Provider: Tillman Abide Other Clinician: Lolita Cram Referring Provider: Tillman Abide Treating Provider/Extender: Altamese [place] in Treatment: 2 Active Problems ICD-10 Encounter Code Description Active Date MDM Diagnosis L89.153 Pressure ulcer of sacral region, stage 3 02/27/2021 No Yes G35 Multiple sclerosis 02/27/2021 No Yes Inactive Problems Resolved Problems Electronic Signature(s) Signed: 03/14/2021 8:16:57 AM By: Baltazar Najjar MD Entered By: Baltazar Najjar on 03/13/2021 09:05:46 [name] D. ([id number]) -------------------------------------------------------------------------------- Progress Note Details Patient Name: [name] D. Date of Service: 03/13/2021 8:00 AM Medical Record Number: [id number] Patient Account Number: [id number] Date of Birth/Sex: 1946/06/23 (74 y.o. F) Treating RN: Huel Coventry Primary Care Provider: Tillman Abide Other Clinician: Lolita Cram Referring Provider: Tillman Abide Treating Provider/Extender: Altamese [place] in Treatment: 2 Subjective History of Present Illness (HPI) Admission 02/27/2021 This woman is a 75 year old woman with fairly advanced multiple sclerosis that was diagnosed in 1982 follows with neurology. In October of last year she had an ORIF of a tib-fib fracture and apparently a shoulder replacement. She went to [facility] facility for rehabilitation is just come home 2 weeks ago. Unbeknownst to the patient or her son they noticed a wound on her sacrum. Her son is able to show me pictures on the phone generally small wound slightly bigger than a dime however completely necrotic surface. They are not exactly sure what they are using on this wound. There is a note from primary care I think a telephone conversation that they sent in [place] on 02/15/2021 although both the patient and her son think it is Sylvant which is something I am really not familiar with. They said it was $1300 at [facility] they ended up paying $300 after insurance. Even for Santyl this would be expense Also the patient has come home a lot more physically challenged. 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 This will make it difficult to keep the pressure off this area. They have [facility] and they are getting a physical therapist out. Past medical history includes; multiple sclerosis ankle fractures, disc herniation, mitral valve prolapse, nephrolithiasis, osteoporosis, rib fractures. 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 We have been using silver collagen. As it turns out the ointment that you are using was Santyl. I told him not to shoot this out as we may need to go back to this. It sounds like they are religiously offloading this only up 1 hour in her chair for meals. Also careful offloading in the wheelchair. 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 We have been using silver collagen  after she came in on [place]. This is a deep wound and there is undermining at the base that is difficult to measure because of the small surface area. Still using silver collagen. We are going to explore a snap VAC and/or Oasis through insurance. Objective Constitutional Sitting or standing Blood Pressure is within target range for patient.. Pulse regular and within target range for patient.Marland Kitchen Respirations regular, non- labored and within target range.. Temperature is normal and within  the target range for the patient.Marland Kitchen appears in no distress. Vitals Time Taken: 8:10 AM, Height: 63 in, Weight: 120 lbs, BMI: 21.3, Temperature: 98.3 F, Pulse: 79 bpm, Respiratory Rate: 18 breaths/min, Blood Pressure: 114/73 mmHg. General Notes: Wound exam; lower sacrum. The wound is definitely come in in terms of its surface area with surrounding nice granulation however this wound is deep with undermining at the base. I do not know that it goes to bone perhaps periosteum it is difficult to tell and almost impossible to visualize because of the small surface area. I used a #3 curette to remove debris from the wound surface is much as I can see. Hemostasis with direct pressure. There is no surrounding erythema and no obvious infection Integumentary (Hair, Skin) Wound #1 status is Open. Original cause of wound was Pressure Injury. The date acquired was: 02/12/2021. The wound has been in treatment 2 weeks. The wound is located on the Sacrum. The wound measures 1.5cm length x 0.5cm width x 1.7cm depth; 0.589cm^2 area and 1.001cm^3 volume. There is Fat Layer (Subcutaneous Tissue) exposed. There is no tunneling noted, however, there is undermining starting at 12:00 and ending at 12:00 with a maximum distance of 2cm. There is a medium amount of serous drainage noted. The wound margin is flat and intact. There is medium (34-66%) red granulation within the wound bed. There is a medium (34-66%) amount of necrotic tissue within  the wound bed including Adherent Slough. SRINIKA, DELONE ([id number]) Assessment Active Problems ICD-10 Pressure ulcer of sacral region, stage 3 Multiple sclerosis Procedures Wound #1 Pre-procedure diagnosis of Wound #1 is a Pressure Ulcer located on the Sacrum . There was a Excisional Skin/Subcutaneous Tissue Debridement with a total area of 0.75 sq cm performed by Maxwell Caul, MD. With the following instrument(s): Curette to remove Viable and Non- Viable tissue/material. Material removed includes Subcutaneous Tissue and Slough and after achieving pain control using Other (Painease). No specimens were taken. A time out was conducted at 08:40, prior to the start of the procedure. A Moderate amount of bleeding was controlled with Pressure. The procedure was tolerated well. Post Debridement Measurements: 1.5cm length x 0.5cm width x 1.8cm depth; 1.06cm^3 volume. Post debridement Stage noted as Category/Stage III. Character of Wound/Ulcer Post Debridement is stable. Post procedure Diagnosis Wound #1: Same as Pre-Procedure Plan Follow-up Appointments: Return Appointment in 1 week. Home Health: Home Health Company: - [facility] for wound care. May utilize formulary equivalent dressing for wound treatment orders unless otherwise specified. Home Health Nurse may visit PRN to address patient s wound care needs. - Wound Care Orders Scheduled days for dressing changes to be completed; exception, patient has scheduled wound care visit that day. **Please direct any NON-WOUND related issues/requests for orders to patient's Primary Care Physician. **If current dressing causes regression in wound condition, may D/C ordered dressing product/s and apply Normal Saline Moist Dressing daily until next Wound Healing Center or Other MD appointment. **Notify Wound Healing Center of regression in wound condition at [phone]. Bathing/ Shower/ Hygiene: May shower; gently cleanse  wound with antibacterial soap, rinse and pat dry prior to dressing wounds Off-Loading: Hospital bed/mattress Low air-loss mattress (Group 2) Turn and reposition every 2 hours Additional Orders / Instructions: Follow Nutritious Diet and Increase Protein Intake General Notes: VOB Snap Vac; VOB Oasis WOUND #1: - Sacrum Wound Laterality: Cleanser: Soap and Water 3 x Per Week/30 Days Discharge Instructions: Gently cleanse wound with antibacterial soap, rinse and pat dry prior to  dressing wounds Primary Dressing: Prisma 4.34 (in) (Home Health) 3 x Per Week/30 Days Discharge Instructions: Moisten w/normal saline and pack lightly into tunnel from 12-1 Secondary Dressing: Mepilex Border Flex, 4x4 (in/in) (Home Health) 3 x Per Week/30 Days Discharge Instructions: Apply to wound as directed. Do not cut. 1. I am continuing with silver collagen this week. 2. Snap VAC and Oasis through her insurance 3. Again a multitude of questions I answered for the patient including why this is moving so slowly, the problems. I talked about pressure relief malnutrition etc. She is also very angry against the facility where this presumably occurred and their lack of communication. Electronic Signature(s) Signed: 03/14/2021 8:16:57 AM By: Baltazar Najjarobson, Lindberg Zenon MD Entered By: Baltazar Najjarobson, [place] Franson on 03/13/2021 09:10:12 Kimberly LambBATEMAN, Kathyleen D. ([id number]) Lajean SilviusBATEMAN, Kenney HousemanENZEL D. ([id number]) -------------------------------------------------------------------------------- SuperBill Details Patient Name: Kimberly LambBATEMAN, Kimberly D. Date of Service: 03/13/2021 Medical Record Number: [id number] Patient Account Number: [id number] Date of Birth/Sex: 27-Feb-1946 (75 y.o. F) Treating RN: Huel CoventryWoody, Kim Primary Care Provider: Tillman AbideLetvak, Richard Other Clinician: Lolita CramBurnette, Kyara Referring Provider: Tillman AbideLetvak, Richard Treating Provider/Extender: Altamese CarolinaOBSON, Mavis Fichera G Weeks in Treatment: 2 Diagnosis Coding ICD-10 Codes Code Description [phone] Pressure ulcer of sacral  region, stage 3 G35 Multiple sclerosis Facility Procedures CPT4 Code: [id number] Description: 11042 - DEB SUBQ TISSUE 20 SQ CM/< Modifier: Quantity: 1 CPT4 Code: Description: ICD-10 Diagnosis Description L89.153 Pressure ulcer of sacral region, stage 3 G35 Multiple sclerosis Modifier: Quantity: Physician Procedures CPT4 Code: [id number] Description: 11042 - WC PHYS SUBQ TISS 20 SQ CM Modifier: Quantity: 1 CPT4 Code: Description: ICD-10 Diagnosis Description L89.153 Pressure ulcer of sacral region, stage 3 G35 Multiple sclerosis Modifier: Quantity: Electronic Signature(s) Signed: 03/14/2021 8:16:57 AM By: Baltazar Najjarobson, Ashlei Chinchilla MD Entered By: Baltazar Najjarobson, Marla Pouliot on 03/13/2021 09:10:28

## 2021-03-14 NOTE — Progress Notes (Signed)
ZEA, KOSTKA ([id number]) Visit Report for 03/13/2021 Arrival Information Details Patient Name: Kimberly, Collins. Date of Service: 03/13/2021 8:00 AM Medical Record Number: [id number] Patient Account Number: [id number] Date of Birth/Sex: [birth year] (75 y.o. F) Treating RN: Huel Coventry Primary Care Carleen Rhue: Tillman Abide Other Clinician: Lolita Cram Referring Hugh Garrow: Tillman Abide Treating Aarya Robinson/Extender: Altamese [place] in Treatment: 2 Visit Information History Since Last Visit Added or deleted any medications: Yes Patient Arrived: Wheel Chair Had a fall or experienced change in No Arrival Time: 08:11 activities of daily living that may affect Accompanied By: son risk of falls: Transfer Assistance: EasyP[employer] Patient Lift Hospitalized since last visit: No Patient Identification Verified: Yes Pain Present Now: Yes Secondary Verification Process Yes Completed: Patient Has Alerts: Yes Patient Alerts: **ALLERGIC TO LIDOCAINE** Electronic Signature(s) Signed: 03/13/2021 4:51:14 PM By: Lolita Cram Entered By: Lolita Cram on 03/13/2021 08:15:25 Arsenio Loader ([id number]) -------------------------------------------------------------------------------- Encounter Discharge Information Details Patient Name: Kimberly Lamb D. Date of Service: 03/13/2021 8:00 AM Medical Record Number: [id number] Patient Account Number: [id number] Date of Birth/Sex: 03-15-1946 (75 y.o. F) Treating RN: Huel Coventry Primary Care Shree Espey: Tillman Abide Other Clinician: Lolita Cram Referring Dawnette Mione: Tillman Abide Treating Juanluis Guastella/Extender: Altamese [place] in Treatment: 2 Encounter Discharge Information Items Post Procedure Vitals Discharge Condition: Stable Temperature (F): 98.3 Ambulatory Status: Ambulatory Pulse (bpm): 79 Discharge Destination: Home Respiratory Rate (breaths/min): 18 Transportation: Private Auto Blood Pressure (mmHg):  114/73 Accompanied By: son Schedule Follow-up Appointment: Yes Clinical Summary of Care: Electronic Signature(s) Signed: 03/13/2021 1:17:06 PM By: Elliot Gurney, BSN, RN, CWS, Kim RN, BSN Entered By: Elliot Gurney, BSN, RN, CWS, Kim on 03/13/2021 08:45:05 Arsenio Loader ([id number]) -------------------------------------------------------------------------------- Lower Extremity Assessment Details Patient Name: Kimberly Lamb D. Date of Service: 03/13/2021 8:00 AM Medical Record Number: [id number] Patient Account Number: [id number] Date of Birth/Sex: [birth year] (75 y.o. F) Treating RN: Huel Coventry Primary Care Davey Bergsma: Tillman Abide Other Clinician: Lolita Cram Referring Antonique Langford: Tillman Abide Treating Emanuele Mcwhirter/Extender: Altamese [place] in Treatment: 2 Electronic Signature(s) Signed: 03/13/2021 1:17:06 PM By: Elliot Gurney BSN, RN, CWS, Kim RN, BSN Signed: 03/13/2021 4:51:14 PM By: Lolita Cram Entered By: Lolita Cram on 03/13/2021 08:16:13 Arsenio Loader ([id number]) -------------------------------------------------------------------------------- Multi Wound Chart Details Patient Name: Kimberly Lamb D. Date of Service: 03/13/2021 8:00 AM Medical Record Number: [id number] Patient Account Number: [id number] Date of Birth/Sex: [birth year] (75 y.o. F) Treating RN: Huel Coventry Primary Care Sui Kasparek: Tillman Abide Other Clinician: Lolita Cram Referring Leroi Haque: Tillman Abide Treating Dezeray Puccio/Extender: Altamese  in Treatment: 2 Vital Signs Height(in): 63 Pulse(bpm): 79 Weight(lbs): 120 Blood Pressure(mmHg): 114/73 Body Mass Index(BMI): 21 Temperature(F): 98.3 Respiratory Rate(breaths/min): 18 Photos: [N/A:N/A] Wound Location: Sacrum N/A N/A Wounding Event: Pressure Injury N/A N/A Primary Etiology: Pressure Ulcer N/A N/A Comorbid History: History of pressure wounds, N/A N/A Osteoarthritis Date Acquired: 02/12/2021 N/A N/A Weeks of Treatment: 2 N/A  N/A Wound Status: Open N/A N/A Measurements L x W x D (cm) 1.5x0.5x1.7 N/A N/A Area (cm) : 0.589 N/A N/A Volume (cm) : 1.001 N/A N/A % Reduction in Area: 75.00% N/A N/A % Reduction in Volume: 57.50% N/A N/A Starting Position 1 (o'clock): 12 Ending Position 1 (o'clock): 12 Maximum Distance 1 (cm): 2 Undermining: Yes N/A N/A Classification: Category/Stage III N/A N/A Exudate Amount: Medium N/A N/A Exudate Type: Serous N/A N/A Exudate Color: amber N/A N/A Wound Margin: Flat and Intact N/A N/A Granulation Amount: Medium (34-66%) N/A N/A Granulation Quality: Red N/A N/A Necrotic Amount: Medium (34-66%) N/A N/A Exposed Structures: Fat Layer (Subcutaneous  Tissue): N/A N/A Yes Fascia: No Tendon: No Muscle: No Joint: No Bone: No Epithelialization: None N/A N/A Debridement: Debridement - Excisional N/A N/A Pre-procedure Verification/Time 08:40 N/A N/A Out Taken: Pain Control: Other N/A N/A Tissue Debrided: Subcutaneous, Slough N/A N/A Level: Skin/Subcutaneous Tissue N/A N/A Debridement Area (sq cm): 0.75 N/A N/A Instrument: Curette N/A N/A Bleeding: Moderate N/A N/A Kimberly, Collins ([id number]) Hemostasis Achieved: Pressure N/A N/A Debridement Treatment Procedure was tolerated well N/A N/A Response: Post Debridement 1.5x0.5x1.8 N/A N/A Measurements L x W x D (cm) Post Debridement Volume: 1.06 N/A N/A (cm) Post Debridement Stage: Category/Stage III N/A N/A Procedures Performed: Debridement N/A N/A Treatment Notes Wound #1 (Sacrum) Cleanser Soap and Water Discharge Instruction: Gently cleanse wound with antibacterial soap, rinse and pat dry prior to dressing wounds Peri-Wound Care Topical Primary Dressing Prisma 4.34 (in) Discharge Instruction: Moisten w/normal saline and pack lightly into tunnel from 12-1 Secondary Dressing Mepilex Border Flex, 4x4 (in/in) Discharge Instruction: Apply to wound as directed. Do not cut. Secured With Compression Wrap Compression  Stockings Facilities manager) Signed: 03/14/2021 8:16:57 AM By: Baltazar Najjar MD Entered By: Baltazar Najjar on 03/13/2021 09:05:53 Arsenio Loader ([id number]) -------------------------------------------------------------------------------- Multi-Disciplinary Care Plan Details Patient Name: Kimberly Lamb D. Date of Service: 03/13/2021 8:00 AM Medical Record Number: [id number] Patient Account Number: [id number] Date of Birth/Sex: October 02, 1946 (74 y.o. F) Treating RN: Huel Coventry Primary Care Jadalee Westcott: Tillman Abide Other Clinician: Lolita Cram Referring Alizeh Madril: Tillman Abide Treating Ayeden Gladman/Extender: Altamese [place] in Treatment: 2 Active Inactive Necrotic Tissue Nursing Diagnoses: Impaired tissue integrity related to necrotic/devitalized tissue Knowledge deficit related to management of necrotic/devitalized tissue Goals: Necrotic/devitalized tissue will be minimized in the wound bed Date Initiated: 02/27/2021 Target Resolution Date: 03/13/2021 Goal Status: Active Interventions: Assess patient pain level pre-, during and post procedure and prior to discharge Treatment Activities: Excisional debridement : 02/27/2021 Notes: Orientation to the Wound Care Program Nursing Diagnoses: Knowledge deficit related to the wound healing center program Goals: Patient/caregiver will verbalize understanding of the Wound Healing Center Program Date Initiated: 02/27/2021 Target Resolution Date: 03/13/2021 Goal Status: Active Interventions: Provide education on orientation to the wound center Notes: Pressure Nursing Diagnoses: Potential for impaired tissue integrity related to pressure, friction, moisture, and shear Goals: Patient will remain free from development of additional pressure ulcers Date Initiated: 02/27/2021 Target Resolution Date: 03/13/2021 Goal Status: Active Interventions: Assess: immobility, friction, shearing, incontinence upon admission and  as needed Provide education on pressure ulcers Notes: Wound/Skin Impairment Nursing Diagnoses: Impaired tissue integrity Kimberly, Collins ([id number]) Goals: Patient/caregiver will verbalize understanding of skin care regimen Date Initiated: 02/27/2021 Target Resolution Date: 03/13/2021 Goal Status: Active Ulcer/skin breakdown will have a volume reduction of 30% by week 4 Date Initiated: 02/27/2021 Target Resolution Date: 03/23/2021 Goal Status: Active Interventions: Assess ulceration(s) every visit Treatment Activities: Skin care regimen initiated : 02/27/2021 Topical wound management initiated : 02/27/2021 Notes: Electronic Signature(s) Signed: 03/13/2021 1:17:06 PM By: Elliot Gurney, BSN, RN, CWS, Kim RN, BSN Entered By: Elliot Gurney, BSN, RN, CWS, Kim on 03/13/2021 08:40:18 Arsenio Loader ([id number]) -------------------------------------------------------------------------------- Pain Assessment Details Patient Name: Kimberly Lamb D. Date of Service: 03/13/2021 8:00 AM Medical Record Number: [id number] Patient Account Number: [id number] Date of Birth/Sex: 1946-09-15 (74 y.o. F) Treating RN: Huel Coventry Primary Care Rece Zechman: Tillman Abide Other Clinician: Lolita Cram Referring TRUE Garciamartinez: Tillman Abide Treating Rawlin Reaume/Extender: Altamese [place] in Treatment: 2 Active Problems Location of Pain Severity and Description of Pain Patient Has Paino Yes Site Locations Rate the pain. Current Pain  Level: 6 Pain Management and Medication Current Pain Management: Electronic Signature(s) Signed: 03/13/2021 1:17:06 PM By: Elliot Gurney, BSN, RN, CWS, Kim RN, BSN Signed: 03/13/2021 4:51:14 PM By: Lolita Cram Entered By: Lolita Cram on 03/13/2021 08:16:04 Arsenio Loader ([id number]) -------------------------------------------------------------------------------- Patient/Caregiver Education Details Patient Name: Kimberly Lamb D. Date of Service: 03/13/2021 8:00 AM Medical  Record Number: [id number] Patient Account Number: [id number] Date of Birth/Gender: 22-Sep-1946 (75 y.o. F) Treating RN: Huel Coventry Primary Care Physician: Tillman Abide Other Clinician: Lolita Cram Referring Physician: Tillman Abide Treating Physician/Extender: Altamese [place] in Treatment: 2 Education Assessment Education Provided To: Patient Education Topics Provided Pressure: Handouts: Pressure Ulcers: Care and Offloading Methods: Demonstration, Explain/Verbal Responses: State content correctly Wound Debridement: Handouts: Wound Debridement Methods: Demonstration, Explain/Verbal Responses: State content correctly Wound/Skin Impairment: Handouts: Caring for Your Ulcer Methods: Demonstration, Explain/Verbal Responses: State content correctly Electronic Signature(s) Signed: 03/13/2021 1:17:06 PM By: Elliot Gurney, BSN, RN, CWS, Kim RN, BSN Entered By: Elliot Gurney, BSN, RN, CWS, Kim on 03/13/2021 08:44:07 Arsenio Loader ([id number]) -------------------------------------------------------------------------------- Wound Assessment Details Patient Name: Kimberly Lamb D. Date of Service: 03/13/2021 8:00 AM Medical Record Number: [id number] Patient Account Number: [id number] Date of Birth/Sex: 08/03/1946 (74 y.o. F) Treating RN: Huel Coventry Primary Care Neidy Guerrieri: Tillman Abide Other Clinician: Lolita Cram Referring Janiya Millirons: Tillman Abide Treating Sumner Kirchman/Extender: Altamese [place] in Treatment: 2 Wound Status Wound Number: 1 Primary Etiology: Pressure Ulcer Wound Location: Sacrum Wound Status: Open Wounding Event: Pressure Injury Comorbid History: History of pressure wounds, Osteoarthritis Date Acquired: 02/12/2021 Weeks Of Treatment: 2 Clustered Wound: No Photos Wound Measurements Length: (cm) 1.5 % Red Width: (cm) 0.5 % Red Depth: (cm) 1.7 Epith Area: (cm) 0.589 Tunn Volume: (cm) 1.001 [address] En Ma uction in Area: 75% uction in Volume:  57.5% elialization: None eling: No rmining: Yes arting Position (o'clock): 12 ding Position (o'clock): 12 ximum Distance: (cm) 2 Wound Description Classification: Category/Stage III Foul Wound Margin: Flat and Intact Slou Exudate Amount: Medium Exudate Type: Serous Exudate Color: amber Odor After Cleansing: No gh/Fibrino Yes Wound Bed Granulation Amount: Medium (34-66%) Exposed Structure Granulation Quality: Red Fascia Exposed: No Necrotic Amount: Medium (34-66%) Fat Layer (Subcutaneous Tissue) Exposed: Yes Necrotic Quality: Adherent Slough Tendon Exposed: No Muscle Exposed: No Joint Exposed: No Bone Exposed: No Treatment Notes Wound #1 (Sacrum) Cleanser Kimberly, Collins ([id number]) Soap and Water Discharge Instruction: Gently cleanse wound with antibacterial soap, rinse and pat dry prior to dressing wounds Peri-Wound Care Topical Primary Dressing Prisma 4.34 (in) Discharge Instruction: Moisten w/normal saline and pack lightly into tunnel from 12-1 Secondary Dressing Mepilex Border Flex, 4x4 (in/in) Discharge Instruction: Apply to wound as directed. Do not cut. Secured With Compression Wrap Compression Stockings Facilities manager) Signed: 03/13/2021 1:17:06 PM By: Elliot Gurney, BSN, RN, CWS, Kim RN, BSN Entered By: Elliot Gurney, BSN, RN, CWS, Kim on 03/13/2021 08:52:39 Arsenio Loader ([id number]) -------------------------------------------------------------------------------- Vitals Details Patient Name: Kimberly Lamb D. Date of Service: 03/13/2021 8:00 AM Medical Record Number: [id number] Patient Account Number: [id number] Date of Birth/Sex: Feb 23, 1946 (74 y.o. F) Treating RN: Huel Coventry Primary Care Marlaya Turck: Tillman Abide Other Clinician: Lolita Cram Referring Leonia Heatherly: Tillman Abide Treating Yailen Zemaitis/Extender: Altamese [place] in Treatment: 2 Vital Signs Time Taken: 08:10 Temperature (F): 98.3 Height (in): 63 Pulse (bpm):  79 Weight (lbs): 120 Respiratory Rate (breaths/min): 18 Body Mass Index (BMI): 21.3 Blood Pressure (mmHg): 114/73 Reference Range: 80 - 120 mg / dl Electronic Signature(s) Signed: 03/13/2021 4:51:14 PM By: Lolita Cram Entered By: Lolita Cram on 03/13/2021 08:15:57

## 2021-03-19 ENCOUNTER — Telehealth: Payer: Self-pay

## 2021-03-19 NOTE — Telephone Encounter (Signed)
Harriett Sine OT with [facility] left v/m requesting verbal order to put OT and PT on hold until wound is better healed. Caregivers have been instructed with ROM for stiffness of joints, position strategies,and pain mgt. Harriett Sine would like to reaccess pt last wk of April. HH nursing will continue uninterrupted.

## 2021-03-20 ENCOUNTER — Other Ambulatory Visit: Payer: Self-pay

## 2021-03-20 ENCOUNTER — Encounter: Payer: Medicare Other | Attending: Internal Medicine | Admitting: Internal Medicine

## 2021-03-20 DIAGNOSIS — I341 Nonrheumatic mitral (valve) prolapse: Secondary | ICD-10-CM | POA: Diagnosis not present

## 2021-03-20 DIAGNOSIS — G35 Multiple sclerosis: Secondary | ICD-10-CM | POA: Insufficient documentation

## 2021-03-20 DIAGNOSIS — L89221 Pressure ulcer of left hip, stage 1: Secondary | ICD-10-CM | POA: Insufficient documentation

## 2021-03-20 DIAGNOSIS — L89153 Pressure ulcer of sacral region, stage 3: Secondary | ICD-10-CM | POA: Insufficient documentation

## 2021-03-20 NOTE — Telephone Encounter (Signed)
Left message on home VM that Dr Alphonsus Sias will be reaching out to try and schedule a Home Visit in the next 2 weeks. These are usually on Wednesdays.

## 2021-03-20 NOTE — Telephone Encounter (Signed)
Left message on verified VM for [name].

## 2021-03-20 NOTE — Telephone Encounter (Signed)
Okay I will try to get out there within 2 weeks

## 2021-03-21 NOTE — Progress Notes (Signed)
[name], Malana D. ([id number]) Visit Report for 03/20/2021 HPI Details Patient Name: [name], Kimberly D. Date of Service: 03/20/2021 8:00 AM Medical Record Number: [id number] Patient Account Number: [id number] Date of Birth/Sex: 17-Dec-1945 (74 y.o. F) Treating RN: Huel CoventryWoody, Kim Primary Care Provider: Tillman AbideLetvak, Richard Other Clinician: Lolita CramBurnette, Kyara Referring Provider: Tillman AbideLetvak, Richard Treating Provider/Extender: Altamese CarolinaOBSON, Maleny Candy G Weeks in Treatment: 3 History of Present Illness HPI Description: Admission 02/27/2021 This woman is a 75 year old woman with fairly advanced multiple sclerosis that was diagnosed in 1982 follows with neurology. In October of last year she had an ORIF of a tib-fib fracture and apparently a shoulder replacement. She went to [facility] skilled [facility] for rehabilitation is just come home 2 weeks ago. Unbeknownst to the patient or her son they noticed a wound on her sacrum. Her son is able to show me pictures on the phone generally small wound slightly bigger than a dime however completely necrotic surface. They are not exactly sure what they are using on this wound. There is a note from primary care I think a telephone conversation that they sent in [place] on 02/15/2021 although both the patient and her son think it is Sylvant which is something I am really not familiar with. [facility] they ended up paying $300 after insurance. Even for Santyl this would be expense Also the patient has come home a lot more physically challenged. 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 This will make it difficult to keep the pressure off this area. They have [facility] and they are getting a physical therapist out. Past medical history includes; multiple sclerosis ankle fractures, disc herniation, mitral valve prolapse, nephrolithiasis, osteoporosis, rib fractures. 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 We have been using silver collagen. As it turns out the ointment that you are using was Santyl. I told him not to shoot this out as we may need to go back to this. It sounds like they are religiously offloading this only up 1 hour in her chair for meals. Also careful offloading in the wheelchair. 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 We have been using silver collagen after she came in on Santyl. This is a deep wound and there is undermining at the base that is difficult to measure because of the small surface area. Still using silver collagen. We are going to explore a snap VAC and/or Oasis through insurance. 4/6; deep stage III or IV wound over the sacrum. It is really difficult to tell given the small surface area orifice of the. We have been using silver collagen the last 2 weeks. The patient is very disabled with marked hip and knee flexion contractures nevertheless they are being fairly 8 religious I think about offloading this correctly. I am not totally sure about her nutritional parameters. She is completing the antibiotics we initially gave her for Streptococcus intermedius which was Augmentin. I think that can be discontinued she had a 10-day course Electronic Signature(s) Signed: 03/21/2021 7:59:07 AM By: Baltazar Najjarobson, Owenn Rothermel MD Entered By: Baltazar Najjarobson, Kaivon Livesey on 03/20/2021 08:56:59 [name], Fronia D. ([id number]) -------------------------------------------------------------------------------- Physical Exam Details Patient Name: [name], Kimberly D. Date of Service: 03/20/2021 8:00 AM Medical Record Number: [id number] Patient Account Number: [id number] Date of Birth/Sex: 17-Dec-1945 (74 y.o. F) Treating RN: Huel CoventryWoody, Kim Primary Care Provider: Tillman AbideLetvak, Richard Other Clinician: Lolita CramBurnette, Kyara Referring Provider: Tillman AbideLetvak, Richard Treating Provider/Extender: Altamese CarolinaOBSON, Yuvraj Pfeifer G Weeks  in Treatment: 3 Constitutional Sitting or standing Blood  Pressure is within target range for patient.. Pulse regular and within target range for patient.Marland Kitchen Respirations regular, non- labored and within target range.. Temperature is normal and within the target range for the patient.Marland Kitchen appears in no distress. Respiratory Respiratory effort is easy and symmetric bilaterally. Rate is normal at rest and on room air.. Musculoskeletal Marked hip and knee flexion contractures. She apparently has physical therapy but this is actually worse than the first time I have seen her. Integumentary (Hair, Skin) There is no erythema around the wound. Notes Wound exam; very small surface area. Depth at 1.7 cm there is tunneling/undermining at 12:00. The base of this from what I can see does not look completely viable although this may all be periosteum. I cannot really tell. There is no tenderness around the wound no erythema no purulent Electronic Signature(s) Signed: 03/21/2021 7:59:07 AM By: Baltazar Najjar MD Entered By: Baltazar Najjar on 03/20/2021 08:58:36 [name] ([id number]) -------------------------------------------------------------------------------- Physician Orders Details Patient Name: Kimberly Lamb D. Date of Service: 03/20/2021 8:00 AM Medical Record Number: [id number] Patient Account Number: [id number] Date of Birth/Sex: 01-May-1946 (74 y.o. F) Treating RN: Huel Coventry Primary Care Provider: Tillman Abide Other Clinician: Lolita Cram Referring Provider: Tillman Abide Treating Provider/Extender: Altamese [place] in Treatment: 3 Verbal / Phone Orders: No Diagnosis Coding Follow-up Appointments o Return Appointment in 1 week. Home Health o Home Health Company: - [facility] for one week. Snap Vac Applied. o CONTINUE Home Health for wound care. May utilize formulary equivalent dressing for wound treatment orders unless otherwise specified. Home Health Nurse may visit PRN to address patientos wound care needs. - Wound  Care Orders o Scheduled days for dressing changes to be completed; exception, patient has scheduled wound care visit that day. o **Please direct any NON-WOUND related issues/requests for orders to patient's Primary Care Physician. **If current dressing causes regression in wound condition, may D/C ordered dressing product/s and apply Normal Saline Moist Dressing daily until next Wound Healing Center or Other MD appointment. **Notify Wound Healing Center of regression in wound condition at [phone]. Bathing/ Shower/ Hygiene o May shower; gently cleanse wound with antibacterial soap, rinse and pat dry prior to dressing wounds Off-Loading o Hospital bed/mattress o Low air-loss mattress (Group 2) o Turn and reposition every 2 hours Additional Orders / Instructions o Follow Nutritious Diet and Increase Protein Intake Negative Pressure Wound Therapy o Snap Vac applied o Number of foam/gauze pieces used in the dressing = - 1 Wound Treatment Wound #1 - Sacrum Cleanser: Soap and Water (Home Health) 3 x Per Week/30 Days Discharge Instructions: Gently cleanse wound with antibacterial soap, rinse and pat dry prior to dressing wounds Topical: Santyl Collagenase Ointment, 30 (gm), tube 3 x Per Week/30 Days Discharge Instructions: apply nickel thick to wound bed only Electronic Signature(s) Signed: 03/20/2021 5:25:07 PM By: Elliot Gurney, BSN, RN, CWS, Kim RN, BSN Signed: 03/21/2021 7:59:07 AM By: Baltazar Najjar MD Entered By: Elliot Gurney, BSN, RN, CWS, Kim on 03/20/2021 08:42:01 [name] ([id number]) -------------------------------------------------------------------------------- Problem List Details Patient Name: ALANEA, NEILSON D. Date of Service: 03/20/2021 8:00 AM Medical Record Number: [id number] Patient Account Number: [id number] Date of Birth/Sex: 10-Jan-1946 (74 y.o. F) Treating RN: Huel Coventry Primary Care Provider: Tillman Abide Other Clinician: Lolita Cram Referring  Provider: Tillman Abide Treating Provider/Extender: Altamese [place] in Treatment: 3 Active Problems ICD-10 Encounter Code Description Active Date MDM Diagnosis L89.153 Pressure ulcer of sacral region, stage 3 02/27/2021 No  Yes G35 Multiple sclerosis 02/27/2021 No Yes Inactive Problems Resolved Problems Electronic Signature(s) Signed: 03/21/2021 7:59:07 AM By: Baltazar Najjar MD Entered By: Baltazar Najjar on 03/20/2021 08:45:52 [name] ([id number]) -------------------------------------------------------------------------------- Progress Note Details Patient Name: Kimberly Lamb D. Date of Service: 03/20/2021 8:00 AM Medical Record Number: [id number] Patient Account Number: [id number] Date of Birth/Sex: 10-Aug-1946 (74 y.o. F) Treating RN: Huel Coventry Primary Care Provider: Tillman Abide Other Clinician: Lolita Cram Referring Provider: Tillman Abide Treating Provider/Extender: Altamese [place] in Treatment: 3 Subjective History of Present Illness (HPI) Admission 02/27/2021 This woman is a 75 year old woman with fairly advanced multiple sclerosis that was diagnosed in 1982 follows with neurology. In October of last year she had an ORIF of a tib-fib fracture and apparently a shoulder replacement. She went to [facility] for rehabilitation is just come home 2 weeks ago. Unbeknownst to the patient or her son they noticed a wound on her sacrum. Her son is able to show me pictures on the phone generally small wound slightly bigger than a dime however completely necrotic surface. They are not exactly sure what they are using on this wound. There is a note from primary care I think a telephone conversation that they sent in [place] on 02/15/2021 although both the patient and her son think it is Sylvant which is something I am really not familiar with. They said it was $1300 at [facility] they ended up paying $300 after insurance. Even for Santyl  this would be expense Also the patient has come home a lot more physically challenged. 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 This will make it difficult to keep the pressure off this area. They have [facility] and they are getting a physical therapist out. Past medical history includes; multiple sclerosis ankle fractures, disc herniation, mitral valve prolapse, nephrolithiasis, osteoporosis, rib fractures. 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 We have been using silver collagen. As it turns out the ointment that you are using was Santyl. I told him not to shoot this out as we may need to go back to this. It sounds like they are religiously offloading this only up 1 hour in her chair for meals. Also careful offloading in the wheelchair. 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 We have been using silver collagen after she came in on Santyl. This is a deep wound and there is undermining at the base that is difficult to measure because of the small surface area. Still using silver collagen. We are going to explore a snap VAC and/or Oasis through insurance. 4/6; deep stage III or IV wound over the sacrum. It is really difficult to tell given the small surface area orifice of the. We have been using silver collagen the last 2 weeks. The patient is very disabled with marked hip and knee flexion contractures nevertheless they are being fairly 8 religious I think about offloading this correctly. I am not totally sure about her nutritional parameters. She is completing the antibiotics we initially gave her for Streptococcus intermedius which was Augmentin. I think that can be discontinued she had a 10-day course Objective Constitutional Sitting or standing Blood Pressure is within target range for patient.. Pulse regular and within target range for patient.Marland Kitchen Respirations regular, non- labored and  within target range.. Temperature is normal and within the target range for the patient.Marland Kitchen appears in no distress. Vitals Time Taken:  8:08 AM, Height: 63 in, Weight: 120 lbs, BMI: 21.3, Temperature: 97.9 F, Pulse: 80 bpm, Respiratory Rate: 16 breaths/min, Blood Pressure: 111/75 mmHg. Respiratory Respiratory effort is easy and symmetric bilaterally. Rate is normal at rest and on room air.. Musculoskeletal Marked hip and knee flexion contractures. She apparently has physical therapy but this is actually worse than the first time I have seen her. General Notes: Wound exam; very small surface area. Depth at 1.7 cm there is tunneling/undermining at 12:00. The base of this from what I can see does not look completely viable although this may all be periosteum. I cannot really tell. There is no tenderness around the wound no erythema no purulent Integumentary (Hair, Skin) There is no erythema around the wound. Wound #1 status is Open. Original cause of wound was Pressure Injury. The date acquired was: 02/12/2021. The wound has been in treatment 3 KITTIE, KRIZAN D. ([id number]) weeks. The wound is located on the Sacrum. The wound measures 1.1cm length x 0.4cm width x 1.3cm depth; 0.346cm^2 area and 0.449cm^3 volume. There is Fat Layer (Subcutaneous Tissue) exposed. There is no tunneling noted, however, there is undermining starting at 12:00 and ending at 1:00 with a maximum distance of 2cm. There is a large amount of purulent drainage noted. The wound margin is flat and intact. There is medium (34-66%) red granulation within the wound bed. There is a medium (34-66%) amount of necrotic tissue within the wound bed including Adherent Slough. Assessment Active Problems ICD-10 Pressure ulcer of sacral region, stage 3 Multiple sclerosis Plan Follow-up Appointments: Return Appointment in 1 week. Home Health: Home Health Company: - [facility] for one week. Snap Vac Applied. CONTINUE Home Health for  wound care. May utilize formulary equivalent dressing for wound treatment orders unless otherwise specified. Home Health Nurse may visit PRN to address patient s wound care needs. - Wound Care Orders Scheduled days for dressing changes to be completed; exception, patient has scheduled wound care visit that day. **Please direct any NON-WOUND related issues/requests for orders to patient's Primary Care Physician. **If current dressing causes regression in wound condition, may D/C ordered dressing product/s and apply Normal Saline Moist Dressing daily until next Wound Healing Center or Other MD appointment. **Notify Wound Healing Center of regression in wound condition at [phone]. Bathing/ Shower/ Hygiene: May shower; gently cleanse wound with antibacterial soap, rinse and pat dry prior to dressing wounds Off-Loading: Hospital bed/mattress Low air-loss mattress (Group 2) Turn and reposition every 2 hours Additional Orders / Instructions: Follow Nutritious Diet and Increase Protein Intake Negative Pressure Wound Therapy: Snap Vac applied Number of foam/gauze pieces used in the dressing = - 1 WOUND #1: - Sacrum Wound Laterality: Cleanser: Soap and Water (Home Health) 3 x Per Week/30 Days Discharge Instructions: Gently cleanse wound with antibacterial soap, rinse and pat dry prior to dressing wounds Topical: Santyl Collagenase Ointment, 30 (gm), tube 3 x Per Week/30 Days Discharge Instructions: apply nickel thick to wound bed only 1. We had approval for both Oasis and a snapback. 2. I think the correct approach here was the snapback I put blinds Santyl on the base of this wound. Putting collagen on the base of this wound would be done blindly and very difficult dressing to place and also difficult to make sure everything was removed. 3. I still keep Oasis in the back pocket over decision making 4. There are going to paint Santyl on as much of the wound surface as we can practically do with  a Q-tip 5.  What I am hoping to achieve is to pull the healthy looking walls of this wound together I am not sure that we are going to be able to lift tissue off the surface of the wound and to be truthful I am not completely certain what that tissue is. It may be acceptable to leave the cavity if we can get closure of the walls and then perhaps use Oasis after this Electronic Signature(s) Signed: 03/21/2021 7:59:07 AM By: Baltazar Najjar MD Entered By: Baltazar Najjar on 03/20/2021 09:02:59 [name] ([id number]) -------------------------------------------------------------------------------- SuperBill Details Patient Name: Kimberly Lamb D. Date of Service: 03/20/2021 Medical Record Number: [id number] Patient Account Number: [id number] Date of Birth/Sex: Aug 29, 1946 (75 y.o. F) Treating RN: Huel Coventry Primary Care Provider: Tillman Abide Other Clinician: Lolita Cram Referring Provider: Tillman Abide Treating Provider/Extender: Altamese [place] in Treatment: 3 Diagnosis Coding ICD-10 Codes Code Description [phone] Pressure ulcer of sacral region, stage 3 G35 Multiple sclerosis Facility Procedures CPT4 Code: [id number] Description: 97607 NEG PRESS WND TX <=50 SQ CM Modifier: Quantity: 1 CPT4 Code: Description: ICD-10 Diagnosis Description L89.153 Pressure ulcer of sacral region, stage 3 Modifier: Quantity: Physician Procedures CPT4 Code: [id number] Description: 99214 - WC PHYS LEVEL 4 - EST PT Modifier: Quantity: 1 CPT4 Code: Description: ICD-10 Diagnosis Description L89.153 Pressure ulcer of sacral region, stage 3 G35 Multiple sclerosis Modifier: Quantity: Electronic Signature(s) Signed: 03/21/2021 7:59:07 AM By: Baltazar Najjar MD Entered By: Baltazar Najjar on 03/20/2021 09:03:21

## 2021-03-22 NOTE — Progress Notes (Addendum)
[name] ([id number]) Visit Report for 03/20/2021 Arrival Information Details Patient Name: [name], [name]. Date of Service: 03/20/2021 8:00 AM Medical Record Number: [id number] Patient Account Number: [id number] Date of Birth/Sex: 16-Feb-1946 (74 y.o. F) Treating RN: Huel Coventry Primary Care Katonya Blecher: Tillman Abide Other Clinician: Lolita Cram Referring Bernd Crom: Tillman Abide Treating Cheyene Hamric/Extender: Altamese [place] in Treatment: 3 Visit Information History Since Last Visit Added or deleted any medications: No Patient Arrived: Wheel Chair Had a fall or experienced change in No Arrival Time: 08:07 activities of daily living that may affect Accompanied By: son risk of falls: Transfer Assistance: None Hospitalized since last visit: No Patient Identification Verified: Yes Pain Present Now: Yes Secondary Verification Process Yes Completed: Patient Has Alerts: Yes Patient Alerts: **ALLERGIC TO LIDOCAINE** Electronic Signature(s) Signed: 03/20/2021 4:53:25 PM By: Lolita Cram Entered By: Lolita Cram on 03/20/2021 08:07:41 Arsenio Loader ([id number]) -------------------------------------------------------------------------------- Encounter Discharge Information Details Patient Name: Kimberly Lamb D. Date of Service: 03/20/2021 8:00 AM Medical Record Number: [id number] Patient Account Number: [id number] Date of Birth/Sex: 1946-02-21 (74 y.o. F) Treating RN: Yevonne Pax Primary Care Clennon Nasca: Tillman Abide Other Clinician: Lolita Cram Referring Sparrow Sanzo: Tillman Abide Treating Nicolae Vasek/Extender: Altamese [place] in Treatment: 3 Encounter Discharge Information Items Discharge Condition: Stable Ambulatory Status: Wheelchair Discharge Destination: Home Transportation: Private Auto Accompanied By: son Schedule Follow-up Appointment: Yes Clinical Summary of Care: Patient Declined Electronic Signature(s) Signed: 03/22/2021 8:10:37 AM  By: Yevonne Pax RN Entered By: Yevonne Pax on 03/20/2021 08:56:28 Arsenio Loader ([id number]) -------------------------------------------------------------------------------- Lower Extremity Assessment Details Patient Name: Kimberly Lamb D. Date of Service: 03/20/2021 8:00 AM Medical Record Number: [id number] Patient Account Number: [id number] Date of Birth/Sex: 14-Feb-1946 (74 y.o. F) Treating RN: Huel Coventry Primary Care Kasi Lasky: Tillman Abide Other Clinician: Lolita Cram Referring Arrin Pintor: Tillman Abide Treating Preslie Depasquale/Extender: Altamese [place] in Treatment: 3 Electronic Signature(s) Signed: 03/20/2021 4:53:25 PM By: Lolita Cram Signed: 03/20/2021 5:25:07 PM By: Elliot Gurney, BSN, RN, CWS, Kim RN, BSN Entered By: Lolita Cram on 03/20/2021 08:19:56 Arsenio Loader ([id number]) -------------------------------------------------------------------------------- Multi Wound Chart Details Patient Name: [name] D. Date of Service: 03/20/2021 8:00 AM Medical Record Number: [id number] Patient Account Number: [id number] Date of Birth/Sex: 03/04/1946 (74 y.o. F) Treating RN: Huel Coventry Primary Care Henrik Orihuela: Tillman Abide Other Clinician: Lolita Cram Referring Infantof Villagomez: Tillman Abide Treating Shivaay Stormont/Extender: Altamese  in Treatment: 3 Vital Signs Height(in): 63 Pulse(bpm): 80 Weight(lbs): 120 Blood Pressure(mmHg): 111/75 Body Mass Index(BMI): 21 Temperature(F): 97.9 Respiratory Rate(breaths/min): 16 Photos: [N/A:N/A] Wound Location: Sacrum N/A N/A Wounding Event: Pressure Injury N/A N/A Primary Etiology: Pressure Ulcer N/A N/A Comorbid History: History of pressure wounds, N/A N/A Osteoarthritis Date Acquired: 02/12/2021 N/A N/A Weeks of Treatment: 3 N/A N/A Wound Status: Open N/A N/A Measurements L x W x D (cm) 1.1x0.4x1.3 N/A N/A Area (cm) : 0.346 N/A N/A Volume (cm) : 0.449 N/A N/A % Reduction in Area: 85.30% N/A N/A %  Reduction in Volume: 80.90% N/A N/A Starting Position 1 (o'clock): 12 Ending Position 1 (o'clock): 1 Maximum Distance 1 (cm): 2 Undermining: Yes N/A N/A Classification: Category/Stage III N/A N/A Exudate Amount: Large N/A N/A Exudate Type: Purulent N/A N/A Exudate Color: yellow, brown, green N/A N/A Wound Margin: Flat and Intact N/A N/A Granulation Amount: Medium (34-66%) N/A N/A Granulation Quality: Red N/A N/A Necrotic Amount: Medium (34-66%) N/A N/A Exposed Structures: Fat Layer (Subcutaneous Tissue): N/A N/A Yes Fascia: No Tendon: No Muscle: No Joint: No Bone: No Epithelialization: None N/A N/A Treatment Notes Electronic Signature(s)  Signed: 03/21/2021 7:59:07 AM By: Baltazar Najjar MD Entered By: Baltazar Najjar on 03/20/2021 08:46:02 Arsenio Loader ([id number]) [name], Kenney Houseman ([id number]) -------------------------------------------------------------------------------- Multi-Disciplinary Care Plan Details Patient Name: [name] D. Date of Service: 03/20/2021 8:00 AM Medical Record Number: [id number] Patient Account Number: [id number] Date of Birth/Sex: 1946-06-16 (74 y.o. F) Treating RN: Huel Coventry Primary Care Laura-Lee Villegas: Tillman Abide Other Clinician: Lolita Cram Referring Adilyn Humes: Tillman Abide Treating Ido Wollman/Extender: Altamese [place] in Treatment: 3 Active Inactive Necrotic Tissue Nursing Diagnoses: Impaired tissue integrity related to necrotic/devitalized tissue Knowledge deficit related to management of necrotic/devitalized tissue Goals: Necrotic/devitalized tissue will be minimized in the wound bed Date Initiated: 02/27/2021 Target Resolution Date: 03/13/2021 Goal Status: Active Interventions: Assess patient pain level pre-, during and post procedure and prior to discharge Treatment Activities: Excisional debridement : 02/27/2021 Notes: Orientation to the Wound Care Program Nursing Diagnoses: Knowledge deficit related to the  wound healing center program Goals: Patient/caregiver will verbalize understanding of the Wound Healing Center Program Date Initiated: 02/27/2021 Target Resolution Date: 03/13/2021 Goal Status: Active Interventions: Provide education on orientation to the wound center Notes: Pressure Nursing Diagnoses: Potential for impaired tissue integrity related to pressure, friction, moisture, and shear Goals: Patient will remain free from development of additional pressure ulcers Date Initiated: 02/27/2021 Target Resolution Date: 03/13/2021 Goal Status: Active Interventions: Assess: immobility, friction, shearing, incontinence upon admission and as needed Provide education on pressure ulcers Notes: Wound/Skin Impairment Nursing Diagnoses: Impaired tissue integrity HIROMI, KNODEL ([id number]) Goals: Patient/caregiver will verbalize understanding of skin care regimen Date Initiated: 02/27/2021 Target Resolution Date: 03/13/2021 Goal Status: Active Ulcer/skin breakdown will have a volume reduction of 30% by week 4 Date Initiated: 02/27/2021 Target Resolution Date: 03/23/2021 Goal Status: Active Interventions: Assess ulceration(s) every visit Treatment Activities: Skin care regimen initiated : 02/27/2021 Topical wound management initiated : 02/27/2021 Notes: Electronic Signature(s) Signed: 03/20/2021 5:25:07 PM By: Elliot Gurney, BSN, RN, CWS, Kim RN, BSN Entered By: Elliot Gurney, BSN, RN, CWS, Kim on 03/20/2021 08:31:23 Arsenio Loader ([id number]) -------------------------------------------------------------------------------- Pain Assessment Details Patient Name: Kimberly Lamb D. Date of Service: 03/20/2021 8:00 AM Medical Record Number: [id number] Patient Account Number: [id number] Date of Birth/Sex: [birth year] (74 y.o. F) Treating RN: Huel Coventry Primary Care Etienne Mowers: Tillman Abide Other Clinician: Lolita Cram Referring Welborn Keena: Tillman Abide Treating Aleila Syverson/Extender: Altamese [place] in Treatment: 3 Active Problems Location of Pain Severity and Description of Pain Patient Has Paino Yes Site Locations Pain Location: Generalized Pain Rate the pain. Current Pain Level: 6 Pain Management and Medication Current Pain Management: Electronic Signature(s) Signed: 03/20/2021 4:53:25 PM By: Lolita Cram Signed: 03/20/2021 5:25:07 PM By: Elliot Gurney, BSN, RN, CWS, Kim RN, BSN Entered By: Lolita Cram on 03/20/2021 08:11:05 Arsenio Loader ([id number]) -------------------------------------------------------------------------------- Patient/Caregiver Education Details Patient Name: Kimberly Lamb D. Date of Service: 03/20/2021 8:00 AM Medical Record Number: [id number] Patient Account Number: [id number] Date of Birth/Gender: Jun 30, 1946 (74 y.o. F) Treating RN: Huel Coventry Primary Care Physician: Tillman Abide Other Clinician: Lolita Cram Referring Physician: Tillman Abide Treating Physician/Extender: Altamese  in Treatment: 3 Education Assessment Education Provided To: Patient Education Topics Provided Pressure: Handouts: Pressure Ulcers: Care and Offloading Methods: Demonstration, Explain/Verbal Responses: State content correctly Wound/Skin Impairment: Handouts: Caring for Your Ulcer, Other: wound care as prescribed Methods: Demonstration, Explain/Verbal Responses: State content correctly Electronic Signature(s) Signed: 03/20/2021 5:25:07 PM By: Elliot Gurney, BSN, RN, CWS, Kim RN, BSN Entered By: Elliot Gurney, BSN, RN, CWS, Kim on 03/20/2021 08:44:42 Arsenio Loader ([id number]) -------------------------------------------------------------------------------- Wound Assessment Details Patient Name: [name],  Jesyca D. Date of Service: 03/20/2021 8:00 AM Medical Record Number: [id number] Patient Account Number: [id number] Date of Birth/Sex: 1946/03/16 (74 y.o. F) Treating RN: Huel Coventry Primary Care Jasier Calabretta: Tillman Abide Other Clinician: Lolita Cram Referring Kapil Petropoulos: Tillman Abide Treating Kerby Borner/Extender: Altamese [place] in Treatment: 3 Wound Status Wound Number: 1 Primary Etiology: Pressure Ulcer Wound Location: Sacrum Wound Status: Open Wounding Event: Pressure Injury Comorbid History: History of pressure wounds, Osteoarthritis Date Acquired: 02/12/2021 Weeks Of Treatment: 3 Clustered Wound: No Photos Wound Measurements Length: (cm) 1.1 % Redu Width: (cm) 0.4 % Redu Depth: (cm) 1.3 Epithe Area: (cm) 0.346 [place] Volume: (cm) 0.449 Under Sta End Max ction in Area: 85.3% ction in Volume: 80.9% lialization: None ling: No mining: Yes rting Position (o'clock): 12 ing Position (o'clock): 1 imum Distance: (cm) 2 Wound Description Classification: Category/Stage III Foul Wound Margin: Flat and Intact Sloug Exudate Amount: Large Exudate Type: Purulent Exudate Color: yellow, brown, green Odor After Cleansing: No h/Fibrino Yes Wound Bed Granulation Amount: Medium (34-66%) Exposed Structure Granulation Quality: Red Fascia Exposed: No Necrotic Amount: Medium (34-66%) Fat Layer (Subcutaneous Tissue) Exposed: Yes Necrotic Quality: Adherent Slough Tendon Exposed: No Muscle Exposed: No Joint Exposed: No Bone Exposed: No Treatment Notes Wound #1 (Sacrum) Cleanser MAYGAN, KOELLER ([id number]) Soap and Water Discharge Instruction: Gently cleanse wound with antibacterial soap, rinse and pat dry prior to dressing wounds Peri-Wound Care Topical Santyl Collagenase Ointment, 30 (gm), tube Discharge Instruction: apply nickel thick to wound bed only Primary Dressing Secondary Dressing Secured With Compression Wrap Compression Stockings Add-Ons Notes snap vac with 1 piece of blue foam Electronic Signature(s) Signed: 03/25/2021 5:05:51 PM By: Elliot Gurney, BSN, RN, CWS, Kim RN, BSN Previous Signature: 03/20/2021 4:53:25 PM Version By: Lolita Cram Previous Signature: 03/20/2021 5:25:07 PM Version By:  Elliot Gurney, BSN, RN, CWS, Kim RN, BSN Entered By: Elliot Gurney, BSN, RN, CWS, Kim on 03/25/2021 17:05:51 Arsenio Loader ([id number]) -------------------------------------------------------------------------------- Vitals Details Patient Name: Kimberly Lamb D. Date of Service: 03/20/2021 8:00 AM Medical Record Number: [id number] Patient Account Number: [id number] Date of Birth/Sex: [birth year] (74 y.o. F) Treating RN: Huel Coventry Primary Care Khyler Eschmann: Tillman Abide Other Clinician: Lolita Cram Referring Conchita Truxillo: Tillman Abide Treating Jalena Vanderlinden/Extender: Altamese [place] in Treatment: 3 Vital Signs Time Taken: 08:08 Temperature (F): 97.9 Height (in): 63 Pulse (bpm): 80 Weight (lbs): 120 Respiratory Rate (breaths/min): 16 Body Mass Index (BMI): 21.3 Blood Pressure (mmHg): 111/75 Reference Range: 80 - 120 mg / dl Electronic Signature(s) Signed: 03/20/2021 4:53:25 PM By: Lolita Cram Entered By: Lolita Cram on 03/20/2021 08:10:33

## 2021-03-27 ENCOUNTER — Ambulatory Visit: Payer: Medicare Other | Admitting: Internal Medicine

## 2021-03-27 ENCOUNTER — Encounter: Payer: Medicare Other | Admitting: Internal Medicine

## 2021-03-27 ENCOUNTER — Other Ambulatory Visit: Payer: Self-pay

## 2021-03-27 ENCOUNTER — Encounter: Payer: Self-pay | Admitting: Internal Medicine

## 2021-03-27 VITALS — BP 102/62 | HR 78 | Resp 16

## 2021-03-27 DIAGNOSIS — L89153 Pressure ulcer of sacral region, stage 3: Secondary | ICD-10-CM | POA: Insufficient documentation

## 2021-03-27 DIAGNOSIS — G8220 Paraplegia, unspecified: Secondary | ICD-10-CM | POA: Diagnosis not present

## 2021-03-27 DIAGNOSIS — L89221 Pressure ulcer of left hip, stage 1: Secondary | ICD-10-CM | POA: Diagnosis not present

## 2021-03-27 DIAGNOSIS — N390 Urinary tract infection, site not specified: Secondary | ICD-10-CM | POA: Diagnosis not present

## 2021-03-27 DIAGNOSIS — G35 Multiple sclerosis: Secondary | ICD-10-CM

## 2021-03-27 NOTE — Assessment & Plan Note (Signed)
Related to her MS Bed bound now---due to ulcer Formerly could transfer with a slide board---arms now getting weak Will need to restart therapy once she can get up again

## 2021-03-27 NOTE — Assessment & Plan Note (Signed)
Got trial of medication for this per Dr Della Goo Didn't tolerate

## 2021-03-27 NOTE — Progress Notes (Signed)
Kimberly, Collins ([id number]) Visit Report for 03/27/2021 HPI Details Patient Name: [name], [name]. Date of Service: 03/27/2021 8:00 AM Medical Record Number: [id number] Patient Account Number: [id number] Date of Birth/Sex: 02/06/1946 (75 y.o. F) Treating RN: Huel Coventry Primary Care Provider: Tillman Abide Other Clinician: Lolita Cram Referring Provider: Tillman Abide Treating Provider/Extender: Altamese [place] in Treatment: 4 History of Present Illness HPI Description: Admission 02/27/2021 This woman is a 75 year old woman with fairly advanced multiple sclerosis that was diagnosed in 1982 follows with neurology. In October of last year she had an ORIF of a tib-fib fracture and apparently a shoulder replacement. She went to [facility] facility for rehabilitation is just come home 2 weeks ago. Unbeknownst to the patient or her son they noticed a wound on her sacrum. Her son is able to show me pictures on the phone generally small wound slightly bigger than a dime however completely necrotic surface. They are not exactly sure what they are using on this wound. There is a note from primary care I think a telephone conversation that they sent in [place] on 02/15/2021 although both the patient and her son think it is Sylvant which is something I am really not familiar with. They said it was $1300 at [facility] they ended up paying $300 after insurance. Even for Santyl this would be expense Also the patient has come home a lot more physically challenged. 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 This will make it difficult to keep the pressure off this area. They have [facility] and they are getting a physical therapist out. Past medical history includes; multiple sclerosis ankle fractures, disc herniation, mitral valve prolapse, nephrolithiasis, osteoporosis, rib fractures. 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 We have been using silver collagen. As it turns out the ointment that you are using was Santyl. I told him not to shoot this out as we may need to go back to this. It sounds like they are religiously offloading this only up 1 hour in her chair for meals. Also careful offloading in the wheelchair. 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 We have been using silver collagen after she came in on Santyl. This is a deep wound and there is undermining at the base that is difficult to measure because of the small surface area. Still using silver collagen. We are going to explore a snap VAC and/or Oasis through insurance. 4/6; deep stage III or IV wound over the sacrum. It is really difficult to tell given the small surface area orifice of the. We have been using silver collagen the last 2 weeks. The patient is very disabled with marked hip and knee flexion contractures nevertheless they are being fairly 8 religious I think about offloading this correctly. I am not totally sure about her nutritional parameters. She is completing the antibiotics we initially gave her for Streptococcus intermedius which was Augmentin. I think that can be discontinued she had a 10-day course 4/13; the snapvac came off 2 days ago. Patient son was unable to replace this. I he put a dressing in there I am not sure which. The area looks as though it is contracted although there is still depth and undermining. He also shows [place] an area on her left greater trochanter Electronic Signature(s) Signed: 03/27/2021 5:03:12 PM By: Baltazar Najjar MD Entered By: Baltazar Najjar on 03/27/2021 08:58:36 [name] ([id number]) --------------------------------------------------------------------------------  Physical Exam Details Patient Name: Kimberly, Collins. Date of Service: 03/27/2021 8:00 AM Medical Record Number: [id number] Patient Account Number: [id number] Date of  Birth/Sex: 13-Feb-1946 (75 y.o. 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 Psychiatric No evidence of depression, anxiety, or agitation. Calm, cooperative, and communicative. Appropriate interactions and affect.. Notes Wound exam; even smaller surface area. Depth at 1.7 cm undermining at 1.6 from 12-3 o'clock. The surface area is really too small to exactly determine this stage. I cannot tell exactly what the surface is this may be tendon. Clearly this is a stage III or IV wound. There is no evidence of infection no surrounding erythema no purulence nothing needed culturing oOver the left greater trochanter there is a large area which is erythematous nonblanchable area of erythema compatible with a stage I pressure injury. This is very concerning in this patient who is so immobile. We recommended zinc oxide I do not think any particular covering dressing is necessary but clearly pressure relief will be necessary Electronic Signature(s) Signed: 03/27/2021 5:03:12 PM By: Baltazar Najjar MD Entered By: Baltazar Najjar on 03/27/2021 09:06:58 Arsenio Loader ([id number]) -------------------------------------------------------------------------------- Physician Orders Details Patient Name: [name] Date of Service: 03/27/2021 8:00 AM Medical Record Number: [id number] Patient Account Number: [id number] Date of Birth/Sex: [birth year] (75 y.o. F) Treating RN: Rogers Blocker Primary Care Provider: Tillman Abide Other Clinician: Lolita Cram Referring Provider: Tillman Abide Treating Provider/Extender: Altamese [place] in Treatment: 4 Verbal / Phone Orders: No Diagnosis Coding Follow-up Appointments o Return Appointment in 1  week. Home Health o Home Health Company: - [facility] for one week. Snap Vac Applied. o CONTINUE Home Health for wound care. May utilize formulary equivalent dressing for wound treatment orders unless otherwise specified. Home Health Nurse may visit PRN to address patientos wound care needs. - Wound Care Orders o Scheduled days for dressing changes to be completed; exception, patient has scheduled wound care visit that day. o **Please direct any NON-WOUND related issues/requests for orders to patient's Primary Care Physician. **If current dressing causes regression in wound condition, may D/C ordered dressing product/s and apply Normal Saline Moist Dressing daily until next Wound Healing Center or Other MD appointment. **Notify Wound Healing Center of regression in wound condition at [phone]. Bathing/ Shower/ Hygiene o May shower; gently cleanse wound with antibacterial soap, rinse and pat dry prior to dressing wounds Off-Loading o Hospital bed/mattress o Low air-loss mattress (Group 2) o Turn and reposition every 2 hours Additional Orders / Instructions o Follow Nutritious Diet and Increase Protein Intake Negative Pressure Wound Therapy o Snap Vac applied o Number of foam/gauze pieces used in the dressing = - 1 Wound Treatment Wound #1 - Sacrum Cleanser: Soap and Water (Home Health) 1 x Per Week/30 Days Discharge Instructions: Gently cleanse wound with antibacterial soap, rinse and pat dry prior to dressing wounds Topical: Santyl Collagenase Ointment, 30 (gm), tube 1 x Per Week/30 Days Discharge Instructions: apply nickel thick to wound bed only Electronic Signature(s) Signed: 03/27/2021 4:47:42 PM By: Phillis Haggis, Dondra Prader RN Signed: 03/27/2021 5:03:12 PM By: Baltazar Najjar MD Entered By: Phillis Haggis, Dondra Prader on 03/27/2021 08:42:57 Arsenio Loader ([id number]) -------------------------------------------------------------------------------- Problem  List Details Patient Name: [name] Date of Service: 03/27/2021 8:00 AM Medical Record Number: [id number] Patient Account Number: [id number] Date of Birth/Sex: [birth year] (74  y.o. F) Treating RN: Huel Coventry Primary Care Provider: Tillman Abide Other Clinician: Lolita Cram Referring Provider: Tillman Abide Treating Provider/Extender: Altamese [place] in Treatment: 4 Active Problems ICD-10 Encounter Code Description Active Date MDM Diagnosis L89.153 Pressure ulcer of sacral region, stage 3 02/27/2021 No Yes G35 Multiple sclerosis 02/27/2021 No Yes L89.221 Pressure ulcer of left hip, stage 1 03/27/2021 No Yes Inactive Problems Resolved Problems Electronic Signature(s) Signed: 03/27/2021 5:03:12 PM By: Baltazar Najjar MD Entered By: Baltazar Najjar on 03/27/2021 08:59:19 Arsenio Loader ([id number]) -------------------------------------------------------------------------------- Progress Note Details Patient Name: [name] Date of Service: 03/27/2021 8:00 AM Medical Record Number: [id number] Patient Account Number: [id number] Date of Birth/Sex: November 24, 1946 (75 y.o. F) Treating RN: Huel Coventry Primary Care Provider: Tillman Abide Other Clinician: Lolita Cram Referring Provider: Tillman Abide Treating Provider/Extender: Altamese [place] in Treatment: 4 Subjective History of Present Illness (HPI) Admission 02/27/2021 This woman is a 75 year old woman with fairly advanced multiple sclerosis that was diagnosed in 1982 follows with neurology. In October of last year she had an ORIF of a tib-fib fracture and apparently a shoulder replacement. She went to [facility] facility for rehabilitation is just come home 2 weeks ago. Unbeknownst to the patient or her son they noticed a wound on her sacrum. Her son is able to show me pictures on the phone generally small wound slightly bigger than a dime however completely necrotic surface.  They are not exactly sure what they are using on this wound. There is a note from primary care I think a telephone conversation that they sent in [place] on 02/15/2021 although both the patient and her son think it is Sylvant which is something I am really not familiar with. They said it was $1300 at [facility] they ended up paying $300 after insurance. Even for Santyl this would be expense Also the patient has come home a lot more physically challenged. 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 This will make it difficult to keep the pressure off this area. They have [facility] and they are getting a physical therapist out. Past medical history includes; multiple sclerosis ankle fractures, disc herniation, mitral valve prolapse, nephrolithiasis, osteoporosis, rib fractures. 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 We have been using silver collagen. As it turns out the ointment that you are using was Santyl. I told him not to shoot this out as we may need to go back to this. It sounds like they are religiously offloading this only up 1 hour in her chair for meals. Also careful offloading in the wheelchair. 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 We have been using silver collagen after she came in on Santyl. This is a deep wound and there is undermining at the base that is difficult to measure because of the small surface area. Still using silver collagen. We are going to explore a snap VAC and/or Oasis through insurance. 4/6; deep stage III or IV wound over the sacrum. It is really difficult to tell given the small surface area orifice of the. We have been using silver collagen the last 2 weeks. The patient is very disabled with marked hip and knee flexion contractures nevertheless they are being fairly 8 religious I think about offloading this correctly. I am not totally sure about her nutritional  parameters. She is completing the antibiotics we initially gave her for Streptococcus intermedius which was  Augmentin. I think that can be discontinued she had a 10-day course 4/13; the snapvac came off 2 days ago. Patient son was unable to replace this. I he put a dressing in there I am not sure which. The area looks as though it is contracted although there is still depth and undermining. He also shows us an area on her left greater trochanter Objective Constitutional Vitals Time Taken: 8:16 AM, Height: 63 in, Weight: 120 lbs, BMI: 21.3, Temperature: 98.0 F, Pulse: 70 bpm, Respiratory Rate: 16 breaths/min, Blood Pressure: 96/65 mmHg. 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 Psychiatric No evidence of depression, anxiety, or agitation. Calm, cooperative, and communicative. Appropriate interactions and affect.. General Notes: Wound exam; even smaller surface area. Depth at 1.7 cm undermining at 1.6 from 12-3 o'clock. The surface area is really too small to exactly determine this stage. I cannot tell exactly what the surface is this may be tendon. Clearly this is a stage III or IV wound. There is no evidence of infection no surrounding erythema no purulence nothing needed culturing Integumentary (Hair, Skin) Wound #1 status is Open. Original cause of wound was Pressure Injury. The date acquired was: 02/12/2021. The wound has been in treatment 4 weeks. The wound is located on the Sacrum. The wound measures 1cm length x 0.3cm width x 1.3cm depth; 0.236cm^2 area and 0.306cm^3 volume. There is Fat Layer (Subcutaneous Tissue) exposed. There is undermining starting at 1:00 and ending at 2:00 with a maximum distance of 1.6cm. There is a large amount of purulent drainage noted. The wound margin is flat and intact. There is medium (34-66%) pink, pale granulation Lasser, Minsa D. ([id number]) within the wound bed. There is a small (1-33%) amount of necrotic tissue  within the wound bed. Assessment Active Problems ICD-10 Pressure ulcer of sacral region, stage 3 Multiple sclerosis Pressure ulcer of left hip, stage 1 Plan Follow-up Appointments: Return Appointment in 1 week. Home Health: Home Health Company: - [facility] for one week. Snap Vac Applied. CONTINUE Home Health for wound care. May utilize formulary equivalent dressing for wound treatment orders unless otherwise specified. Home Health Nurse may visit PRN to address patient s wound care needs. - Wound Care Orders Scheduled days for dressing changes to be completed; exception, patient has scheduled wound care visit that day. **Please direct any NON-WOUND related issues/requests for orders to patient's Primary Care Physician. **If current dressing causes regression in wound condition, may D/C ordered dressing product/s and apply Normal Saline Moist Dressing daily until next Wound Healing Center or Other MD appointment. **Notify Wound Healing Center of regression in wound condition at [phone]. Bathing/ Shower/ Hygiene: May shower; gently cleanse wound with antibacterial soap, rinse and pat dry prior to dressing wounds Off-Loading: Hospital bed/mattress Low air-loss mattress (Group 2) Turn and reposition every 2 hours Additional Orders / Instructions: Follow Nutritious Diet and Increase Protein Intake Negative Pressure Wound Therapy: Snap Vac applied Number of foam/gauze pieces used in the dressing = - 1 WOUND #1: - Sacrum Wound Laterality: Cleanser: Soap and Water (Home Health) 1 x Per Week/30 Days Discharge Instructions: Gently cleanse wound with antibacterial soap, rinse and pat dry prior to dressing wounds Topical: Santyl Collagenase Ointment, 30 (gm), tube 1 x Per Week/30 Days Discharge Instructions: apply nickel thick to wound bed only #1 we reapplied the snap VAC. I do not believe [facility] can manage this. 2. We have been painting Santyl on the surface of the  wound using a Q-tip. I  am not really sure this is doing any particular going to allow certainly not doing any harm. 3. We have the direct measurements in depth at 1.7 and undermining at 1.6 from 1-3. This should give [place] some measurements to follow as the weeks go by. 4. I may consider a debridement with a 3 curette of the base of this wound to see if I can stimulate some granulation next week 5. Clearly a stage III or IV wound at this point. They still have not been able to get the hospital bed. Not sure what the issue is here. 6. A new wound stage I wound over the left hip. This is concerning. Understandable but unfortunately tried to position her off this area relative to other areas is going to be an Surveyor, quantity) Signed: 03/27/2021 5:03:12 PM By: Baltazar Najjar MD Entered By: Baltazar Najjar on 03/27/2021 09:05:22 Arsenio Loader ([id number]) -------------------------------------------------------------------------------- SuperBill Details Patient Name: [name] Date of Service: 03/27/2021 Medical Record Number: [id number] Patient Account Number: [id number] Date of Birth/Sex: [birth year] (75 y.o. F) Treating RN: Rogers Blocker Primary Care Provider: Tillman Abide Other Clinician: Lolita Cram Referring Provider: Tillman Abide Treating Provider/Extender: Altamese [place] in Treatment: 4 Diagnosis Coding ICD-10 Codes Code Description [phone] Pressure ulcer of sacral region, stage 3 G35 Multiple sclerosis L89.221 Pressure ulcer of left hip, stage 1 Facility Procedures CPT4 Code: [id number] Description: 99213 - WOUND CARE VISIT-LEV 3 EST PT Modifier: Quantity: 1 Physician Procedures CPT4 Code: 1245809 Description: 99214 - WC PHYS LEVEL 4 - EST PT Modifier: Quantity: 1 CPT4 Code: Description: ICD-10 Diagnosis Description L89.153 Pressure ulcer of sacral region, stage 3 L89.221 Pressure ulcer of left hip, stage  1 Modifier: Quantity: Electronic Signature(s) Signed: 03/27/2021 5:03:12 PM By: Baltazar Najjar MD Entered By: Baltazar Najjar on 03/27/2021 09:05:54

## 2021-03-27 NOTE — Assessment & Plan Note (Signed)
No recent infections on the methenamine

## 2021-03-27 NOTE — Assessment & Plan Note (Signed)
Now getting smaller but still deep Wound vac now per wound clinic

## 2021-03-27 NOTE — Progress Notes (Signed)
Subjective:    Patient ID: [name], female    DOB: 14-Oct-1946, 75 y.o.   MRN: [id number]  HPI Home visit for follow up of chronic health conditions Now home bound after ankle fracture and shoulder surgery Aide Thea Silversmith is here  Sprained ankle--then fractured tib/fib Had surgery to fixate left tibia Then had the shoulder replacement that she needed--November 1st Went to rehab after that---[facility]  Was kept in one position there No reported redness at first--then early redness/ulcer--they put a patch on every 5 days She was told that it was stable and healing Son had investigated living units for her---they came for intake exam and noted the ulcer and told the staff there (it was pretty bad by then)   Came home the end of February When she left---she called wound specialist but it took about 2 weeks to get in Now in bed to keep pressure off the wound--only up for 1 hour a day She went back there today Home health nurses had been coming but not now Does have a wound vacuum---deep wound (either stage 3 or 4) No pain in the ulcer  Has aide during the day--till 10PM Son [name] is here at night  Current Outpatient Medications on File Prior to Visit  Medication Sig Dispense Refill  . methenamine (HIPREX) 1 g tablet Take 1 tablet (1 g total) by mouth every other day. 30 tablet 0   No current facility-administered medications on file prior to visit.    Allergies  Allergen Reactions  . Baclofen Diarrhea and Nausea And Vomiting  . Lidocaine     rash  . Procaine Hcl     REACTION: UNSPECIFIED    Past Medical History:  Diagnosis Date  . Ankle fracture, left 07/2006  . Ankle fracture, right 10/30/06   [facility]  . Compression fracture 2011   Dr. Dion Saucier   . Disc herniation    L5, S1  . MS (multiple sclerosis) (HCC)    evaluated by Dr. Sandria Manly  . MVP (mitral valve prolapse) 1979   ECHO. Trace MR, trace TR, trace PR 07/17/03  . Nephrolithiasis   . Osteoporosis   .  Rib fractures 1998, [id number]   2  . Toe fracture 1998   5    Past Surgical History:  Procedure Laterality Date  . LUMBAR DISC SURGERY  04/2007   L5, S1- [facility]  . OPEN REDUCTION INTERNAL FIXATION (ORIF) TIBIA/FIBULA FRACTURE Left 10/13/2020   Procedure: OPEN REDUCTION INTERNAL FIXATION (ORIF) TIBIA/FIBULA FRACTURE;  Surgeon: Roby Lofts, MD;  Location: [facility] OR;  Service: Orthopedics;  Laterality: Left;  . REVERSE SHOULDER ARTHROPLASTY Right 10/15/2020   Procedure: REVERSE SHOULDER ARTHROPLASTY;  Surgeon: Bjorn Pippin, MD;  Location: [facility] OR;  Service: Orthopedics;  Laterality: Right;  . SEPTOPLASTY  1983   deviated septum  . TONSILLECTOMY AND ADENOIDECTOMY  1960  . TOTAL ABDOMINAL HYSTERECTOMY W/ BILATERAL SALPINGOOPHORECTOMY  1982   for endometriosis    Family History  Problem Relation Age of Onset  . Stroke Mother   . Neuropathy Brother   . Stroke Father   . Hypertension Neg Hx   . Diabetes Neg Hx   . Depression Neg Hx   . Alcohol abuse Neg Hx   . Drug abuse Neg Hx     Social History   Socioeconomic History  . Marital status: Widowed    Spouse name: Not on file  . Number of children: 2  . Years of education: HS  . Highest  education level: Not on file  Occupational History  . Occupation: Secretarial work---disabled  Tobacco Use  . Smoking status: Never Smoker  . Smokeless tobacco: Never Used  Substance and Sexual Activity  . Alcohol use: No  . Drug use: No  . Sexual activity: Not on file  Other Topics Concern  . Not on file  Social History Narrative   Widowed 1991.  Lives alone.    Played piano.       No living will   Son [name] should make decisions for her prn   Would accept resuscitation attempts   No tube feeds if cognitively unaware      Caffeine SJG:GEZMOQ decaf   Right handed    Social Determinants of Health   Financial Resource Strain: Not on file  Food Insecurity: Not on file  Transportation Needs: Not on file  Physical Activity: Not on file   Stress: Not on file  Social Connections: Not on file  Intimate Partner Violence: Not on file   Review of Systems Hard to eat in bed---does eat better for dinner (up in chair in kitchen). Breakfast and lunch in bed Takes protein drinks/high protein snacks/ensure Has gained some weight--but no way to weigh her No SOB Trouble sleeping due to pain Bowels moving fairly well No recent UTIs---still on the preventative    Objective:   Physical Exam Cardiovascular:     Rate and Rhythm: Normal rate and regular rhythm.     Heart sounds: No murmur heard. No gallop.   Pulmonary:     Effort: Pulmonary effort is normal.     Breath sounds: Normal breath sounds. No wheezing or rales.  Musculoskeletal:     Right lower leg: No edema.     Left lower leg: No edema.  Lymphadenopathy:     Cervical: No cervical adenopathy.  Neurological:     Mental Status: She is alert.     Comments: No leg movement            Assessment & Plan:

## 2021-04-03 ENCOUNTER — Other Ambulatory Visit: Payer: Self-pay

## 2021-04-03 ENCOUNTER — Encounter: Payer: Medicare Other | Admitting: Physician Assistant

## 2021-04-03 DIAGNOSIS — L89221 Pressure ulcer of left hip, stage 1: Secondary | ICD-10-CM | POA: Diagnosis not present

## 2021-04-03 NOTE — Progress Notes (Addendum)
[name] ([id number]) Visit Report for 04/03/2021 Chief Complaint Document Details Patient Name: [name], [name]. Date of Service: 04/03/2021 2:45 PM Medical Record Number: [id number] Patient Account Number: [id number] Date of Birth/Sex: 1946/03/30 (75 y.o. F) Treating RN: Huel Coventry Primary Care Provider: Tillman Abide Other Clinician: Lolita Cram Referring Provider: Tillman Abide Treating Provider/Extender: Rowan Blase in Treatment: 5 Information Obtained from: Patient Chief Complaint 02/27/2021; patient is here for review of a pressure ulcer on the sacrum Electronic Signature(s) Signed: 04/03/2021 3:13:34 PM By: Lenda Kelp PA-C Entered By: Lenda Kelp on 04/03/2021 15:13:34 Kimberly Lamb D. ([id number]) -------------------------------------------------------------------------------- HPI Details Patient Name: Kimberly Lamb D. Date of Service: 04/03/2021 2:45 PM Medical Record Number: [id number] Patient Account Number: [id number] Date of Birth/Sex: [birth year] (75 y.o. F) Treating RN: Huel Coventry Primary Care Provider: Tillman Abide Other Clinician: Lolita Cram Referring Provider: Tillman Abide Treating Provider/Extender: Rowan Blase in Treatment: 5 History of Present Illness HPI Description: Admission 02/27/2021 This woman is a 75 year old woman with fairly advanced multiple sclerosis that was diagnosed in 1982 follows with neurology. In October of last year she had an ORIF of a tib-fib fracture and apparently a shoulder replacement. She went to [facility] for rehabilitation is just come home 2 weeks ago. Unbeknownst to the patient or her son they noticed a wound on her sacrum. Her son is able to show me pictures on the phone generally small wound slightly bigger than a dime however completely necrotic surface. They are not exactly sure what they are using on this wound. There is a note from primary care I think a  telephone conversation that they sent in [place] on 02/15/2021 although both the patient and her son think it is Sylvant which is something I am really not familiar with. They said it was $1300 at [facility] they ended up paying $300 after insurance. Even for Santyl this would be expense Also the patient has come home a lot more physically challenged. 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 This will make it difficult to keep the pressure off this area. They have [facility] and they are getting a physical therapist out. Past medical history includes; multiple sclerosis ankle fractures, disc herniation, mitral valve prolapse, nephrolithiasis, osteoporosis, rib fractures. 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 We have been using silver collagen. As it turns out the ointment that you are using was Santyl. I told him not to shoot this out as we may need to go back to this. It sounds like they are religiously offloading this only up 1 hour in her chair for meals. Also careful offloading in the wheelchair. 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 We have been using silver collagen after she came in on Santyl. This is a deep wound and there is undermining at the base that is difficult to measure because of the small surface area. Still using silver collagen. We are going to explore a snap VAC and/or Oasis through insurance. 4/6; deep stage III or IV wound over the sacrum. It is really difficult to tell given the small surface area orifice of the. We have been using silver collagen the last 2 weeks. The patient is very disabled with marked hip and knee flexion contractures nevertheless they are being fairly 8 religious I think about offloading this correctly. I am not totally sure about her nutritional parameters. She is completing  the antibiotics we initially gave her for Streptococcus intermedius which  was Augmentin. I think that can be discontinued she had a 10-day course 4/13; the snapvac came off 2 days ago. Patient son was unable to replace this. I he put a dressing in there I am not sure which. The area looks as though it is contracted although there is still depth and undermining. He also shows [place] an area on her left greater trochanter 04/03/2021 upon evaluation today patient appears to be doing well currently in regard to her wound in the sacral region. She has been tolerating the snap VAC without complication. I do not see anything right now that Santyl seems to be doing for her to be perfectly honest. I think that we may want to switch to adding a little bit of collagen underneath the Stafac I think that would be absolutely appropriate. Electronic Signature(s) Signed: 04/03/2021 4:46:12 PM By: Lenda Kelp PA-C Entered By: Lenda Kelp on 04/03/2021 16:46:12 [name] ([id number]) -------------------------------------------------------------------------------- Physical Exam Details Patient Name: Kimberly Lamb D. Date of Service: 04/03/2021 2:45 PM Medical Record Number: [id number] Patient Account Number: [id number] Date of Birth/Sex: [birth year] (75 y.o. F) Treating RN: Huel Coventry Primary Care Provider: Tillman Abide Other Clinician: Lolita Cram Referring Provider: Tillman Abide Treating Provider/Extender: Rowan Blase in Treatment: 5 Constitutional Well-nourished and well-hydrated in no acute distress. Respiratory normal breathing without difficulty. Psychiatric this patient is able to make decisions and demonstrates good insight into disease process. Alert and Oriented x 3. pleasant and cooperative. Notes Patient's wound bed actually showed signs of good granulation epithelization. I did actually check the undermined areas today. We did update that as well as the depth of the wound. The measurements were all better at this point which is excellent  news and I am very pleased in that regard. There does not appear to be any signs of active infection at this time which is also excellent news. The depth of the wound was 1.3 cm in the undermining area was from 1-3 o'clock at 1.4 cm deepest. Electronic Signature(s) Signed: 04/03/2021 4:47:40 PM By: Lenda Kelp PA-C Entered By: Lenda Kelp on 04/03/2021 16:47:40 [name] ([id number]) -------------------------------------------------------------------------------- Physician Orders Details Patient Name: Kimberly Lamb D. Date of Service: 04/03/2021 2:45 PM Medical Record Number: [id number] Patient Account Number: [id number] Date of Birth/Sex: 1945-12-17 (75 y.o. F) Treating RN: Hansel Feinstein Primary Care Provider: Tillman Abide Other Clinician: Lolita Cram Referring Provider: Tillman Abide Treating Provider/Extender: Rowan Blase in Treatment: 5 Verbal / Phone Orders: No Diagnosis Coding ICD-10 Coding Code Description L89.153 Pressure ulcer of sacral region, stage 3 G35 Multiple sclerosis L89.221 Pressure ulcer of left hip, stage 1 Follow-up Appointments o Return Appointment in 1 week. Home Health o Home Health Company: - [facility] for one week. Snap Vac Applied. o CONTINUE Home Health for wound care. May utilize formulary equivalent dressing for wound treatment orders unless otherwise specified. Home Health Nurse may visit PRN to address patientos wound care needs. - Wound Care Orders o Scheduled days for dressing changes to be completed; exception, patient has scheduled wound care visit that day. o **Please direct any NON-WOUND related issues/requests for orders to patient's Primary Care Physician. **If current dressing causes regression in wound condition, may D/C ordered dressing product/s and apply Normal Saline Moist Dressing daily until next Wound Healing Center or Other MD appointment. **Notify Wound Healing Center of regression in wound  condition at [phone]. Bathing/ Shower/ Hygiene o May shower; gently  cleanse wound with antibacterial soap, rinse and pat dry prior to dressing wounds Off-Loading o Hospital bed/mattress o Low air-loss mattress (Group 2) o Turn and reposition every 2 hours Additional Orders / Instructions o Follow Nutritious Diet and Increase Protein Intake Negative Pressure Wound Therapy o Snap Vac applied o Number of foam/gauze pieces used in the dressing = - 1 Wound Treatment Wound #1 - Sacrum Cleanser: Soap and Water (Home Health) 1 x Per Week/30 Days Discharge Instructions: Gently cleanse wound with antibacterial soap, rinse and pat dry prior to dressing wounds Primary Dressing: Prisma 4.34 (in) 1 x Per Week/30 Days Discharge Instructions: Moisten w/normal saline or sterile water; Cover wound as directed. Do not remove from wound bed. Electronic Signature(s) Signed: 04/03/2021 4:31:23 PM By: Hansel Feinstein Signed: 04/03/2021 4:50:12 PM By: Lenda Kelp PA-C Entered By: Hansel Feinstein on 04/03/2021 15:35:49 [name] ([id number]) -------------------------------------------------------------------------------- Problem List Details Patient Name: Kimberly Lamb D. Date of Service: 04/03/2021 2:45 PM Medical Record Number: [id number] Patient Account Number: [id number] Date of Birth/Sex: 18-Feb-1946 (75 y.o. F) Treating RN: Huel Coventry Primary Care Provider: Tillman Abide Other Clinician: Lolita Cram Referring Provider: Tillman Abide Treating Provider/Extender: Rowan Blase in Treatment: 5 Active Problems ICD-10 Encounter Code Description Active Date MDM Diagnosis L89.153 Pressure ulcer of sacral region, stage 3 02/27/2021 No Yes G35 Multiple sclerosis 02/27/2021 No Yes L89.221 Pressure ulcer of left hip, stage 1 03/27/2021 No Yes Inactive Problems Resolved Problems Electronic Signature(s) Signed: 04/03/2021 3:13:27 PM By: Lenda Kelp PA-C Entered By: Lenda Kelp on 04/03/2021 15:13:27 Kimberly Lamb D. ([id number]) -------------------------------------------------------------------------------- Progress Note Details Patient Name: Kimberly Lamb D. Date of Service: 04/03/2021 2:45 PM Medical Record Number: [id number] Patient Account Number: [id number] Date of Birth/Sex: Apr 07, 1946 (75 y.o. F) Treating RN: Huel Coventry Primary Care Provider: Tillman Abide Other Clinician: Lolita Cram Referring Provider: Tillman Abide Treating Provider/Extender: Rowan Blase in Treatment: 5 Subjective Chief Complaint Information obtained from Patient 02/27/2021; patient is here for review of a pressure ulcer on the sacrum History of Present Illness (HPI) Admission 02/27/2021 This woman is a 75 year old woman with fairly advanced multiple sclerosis that was diagnosed in 1982 follows with neurology. In October of last year she had an ORIF of a tib-fib fracture and apparently a shoulder replacement. She went to [facility] for rehabilitation is just come home 2 weeks ago. Unbeknownst to the patient or her son they noticed a wound on her sacrum. Her son is able to show me pictures on the phone generally small wound slightly bigger than a dime however completely necrotic surface. They are not exactly sure what they are using on this wound. There is a note from primary care I think a telephone conversation that they sent in [place] on 02/15/2021 although both the patient and her son think it is Sylvant which is something I am really not familiar with. They said it was $1300 at [facility] they ended up paying $300 after insurance. Even for Santyl this would be expense Also the patient has come home a lot more physically challenged. 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 This will make it difficult to keep the pressure off this area. They have [facility] and they are getting a  physical therapist out. Past medical history includes; multiple sclerosis ankle fractures, disc herniation, mitral valve prolapse, nephrolithiasis, osteoporosis, rib fractures. 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  We have been using silver collagen. As it turns out the ointment that you are using was Santyl. I told him not to shoot this out as we may need to go back to this. It sounds like they are religiously offloading this only up 1 hour in her chair for meals. Also careful offloading in the wheelchair. 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 We have been using silver collagen after she came in on Santyl. This is a deep wound and there is undermining at the base that is difficult to measure because of the small surface area. Still using silver collagen. We are going to explore a snap VAC and/or Oasis through insurance. 4/6; deep stage III or IV wound over the sacrum. It is really difficult to tell given the small surface area orifice of the. We have been using silver collagen the last 2 weeks. The patient is very disabled with marked hip and knee flexion contractures nevertheless they are being fairly 8 religious I think about offloading this correctly. I am not totally sure about her nutritional parameters. She is completing the antibiotics we initially gave her for Streptococcus intermedius which was Augmentin. I think that can be discontinued she had a 10-day course 4/13; the snapvac came off 2 days ago. Patient son was unable to replace this. I he put a dressing in there I am not sure which. The area looks as though it is contracted although there is still depth and undermining. He also shows us an area on her left greater trochanter 04/03/2021 upon evaluation today patient appears to be doing well currently in regard to her wound in the sacral region. She has been tolerating the snap VAC without complication. I do not see anything right now  that Santyl seems to be doing for her to be perfectly honest. I think that we may want to switch to adding a little bit of collagen underneath the Stafac I think that would be absolutely appropriate. Objective Constitutional Well-nourished and well-hydrated in no acute distress. Vitals Time Taken: 2:55 PM, Height: 63 in, Weight: 120 lbs, BMI: 21.3, Temperature: 98.7 F, Pulse: 81 bpm, Respiratory Rate: 18 breaths/min, Blood Pressure: 111/74 mmHg. Respiratory normal breathing without difficulty. Psychiatric this patient is able to make decisions and demonstrates good insight into disease process. Alert and Oriented x 3. pleasant and cooperative. Kimberly LoaderBATEMAN, Kimberly D. ([id number]) General Notes: Patient's wound bed actually showed signs of good granulation epithelization. I did actually check the undermined areas today. We did update that as well as the depth of the wound. The measurements were all better at this point which is excellent news and I am very pleased in that regard. There does not appear to be any signs of active infection at this time which is also excellent news. The depth of the wound was 1.3 cm in the undermining area was from 1-3 o'clock at 1.4 cm deepest. Integumentary (Hair, Skin) Wound #1 status is Open. Original cause of wound was Pressure Injury. The date acquired was: 02/12/2021. The wound has been in treatment 5 weeks. The wound is located on the Sacrum. The wound measures 0.6cm length x 0.4cm width x 1.3cm depth; 0.188cm^2 area and 0.245cm^3 volume. There is Fat Layer (Subcutaneous Tissue) exposed. There is no tunneling noted, however, there is undermining starting at 1:00 and ending at 3:00 with a maximum distance of 1.4cm. There is a medium amount of serosanguineous drainage noted. The wound margin is flat and intact. There is medium (34-66%) pink, pale granulation  within the wound bed. There is a small (1-33%) amount of necrotic tissue within the wound  bed. Assessment Active Problems ICD-10 Pressure ulcer of sacral region, stage 3 Multiple sclerosis Pressure ulcer of left hip, stage 1 Plan Follow-up Appointments: Return Appointment in 1 week. Home Health: Home Health Company: - [facility] for one week. Snap Vac Applied. CONTINUE Home Health for wound care. May utilize formulary equivalent dressing for wound treatment orders unless otherwise specified. Home Health Nurse may visit PRN to address patient s wound care needs. - Wound Care Orders Scheduled days for dressing changes to be completed; exception, patient has scheduled wound care visit that day. **Please direct any NON-WOUND related issues/requests for orders to patient's Primary Care Physician. **If current dressing causes regression in wound condition, may D/C ordered dressing product/s and apply Normal Saline Moist Dressing daily until next Wound Healing Center or Other MD appointment. **Notify Wound Healing Center of regression in wound condition at [phone]. Bathing/ Shower/ Hygiene: May shower; gently cleanse wound with antibacterial soap, rinse and pat dry prior to dressing wounds Off-Loading: Hospital bed/mattress Low air-loss mattress (Group 2) Turn and reposition every 2 hours Additional Orders / Instructions: Follow Nutritious Diet and Increase Protein Intake Negative Pressure Wound Therapy: Snap Vac applied Number of foam/gauze pieces used in the dressing = - 1 WOUND #1: - Sacrum Wound Laterality: Cleanser: Soap and Water (Home Health) 1 x Per Week/30 Days Discharge Instructions: Gently cleanse wound with antibacterial soap, rinse and pat dry prior to dressing wounds Primary Dressing: Prisma 4.34 (in) 1 x Per Week/30 Days Discharge Instructions: Moisten w/normal saline or sterile water; Cover wound as directed. Do not remove from wound bed. 1. With regard to the wound bed I do feel like this is still a stage III pressure ulcer I think it is doing excellent  as far as healing I think we do see improvement here the patient's area of depth was 1.3 cm currently upon evaluation today and the 1-3 o'clock location of undermining was a maximum distance of 1.4 cm. Both measurements are better than last week. 2. As well. With all that being said the patient does still have quite a ways to go to get this to heal I think she is probably looking at a minimum of a month although I explained it can definitely take longer sometimes these wounds when they get small can take a exorbitant amount of time to actually heal completely. And again that something we cannot really control for. 3. We will continue with the snap VAC at this point. 4. With regard to the air mattress unfortunately [facility] is apparently telling us that are rather telling the patient and her son that we are writing the order wrong or that the doctor is not labeling the wound correctly. This is a stage III pressure ulcer that is absolutely qualification for a air mattress we have never had any problems like this before and I think is ridiculous that they are giving the patient such a hard time with this. With that being said nonetheless the son tells me that they are planning to just get a sleep number bed to try to help her with positioning and offloading I do not think that is a bad idea obviously this will help because she can sleep in a softer area and then when she needs to get up [name], Kimberly D. ([id number]) out of bed and be moved and repositioned she conferment up so is easier to do this. Obviously that  is quite expensive but they are definitely okay with doing this. We will see patient back for reevaluation in 1 week here in the clinic. If anything worsens or changes patient will contact our office for additional recommendations. Electronic Signature(s) Signed: 04/03/2021 4:49:19 PM By: Lenda Kelp PA-C Previous Signature: 04/03/2021 4:48:13 PM Version By: Lenda Kelp  PA-C Entered By: Lenda Kelp on 04/03/2021 16:49:19 Kimberly Lamb D. ([id number]) -------------------------------------------------------------------------------- SuperBill Details Patient Name: Kimberly Lamb D. Date of Service: 04/03/2021 Medical Record Number: [id number] Patient Account Number: [id number] Date of Birth/Sex: 10-Jul-1946 (75 y.o. F) Treating RN: Yevonne Pax Primary Care Provider: Tillman Abide Other Clinician: Lolita Cram Referring Provider: Tillman Abide Treating Provider/Extender: Rowan Blase in Treatment: 5 Diagnosis Coding ICD-10 Codes Code Description [phone] Pressure ulcer of sacral region, stage 3 G35 Multiple sclerosis L89.221 Pressure ulcer of left hip, stage 1 Facility Procedures CPT4 Code: [id number] Description: 76160 - WOUND VAC-50 SQ CM OR LESS Modifier: Quantity: 1 Physician Procedures CPT4 Code: 7371062 Description: 99214 - WC PHYS LEVEL 4 - EST PT Modifier: Quantity: 1 CPT4 Code: Description: ICD-10 Diagnosis Description L89.153 Pressure ulcer of sacral region, stage 3 G35 Multiple sclerosis L89.221 Pressure ulcer of left hip, stage 1 Modifier: Quantity: Electronic Signature(s) Signed: 04/03/2021 4:49:36 PM By: Lenda Kelp PA-C Entered By: Lenda Kelp on 04/03/2021 16:49:35

## 2021-04-03 NOTE — Progress Notes (Signed)
SYERRA, ABDELRAHMAN ([id number]) Visit Report for 03/27/2021 Arrival Information Details Patient Name: [name], [name]. Date of Service: 03/27/2021 8:00 AM Medical Record Number: [id number] Patient Account Number: [id number] Date of Birth/Sex: 01/17/1946 (74 y.o. F) Treating RN: Cornell Barman Primary Care Korynn Kenedy: Viviana Simpler Other Clinician: Jeanine Luz Referring Amadeo Coke: Viviana Simpler Treating Antonae Zbikowski/Extender: Tito Dine in Treatment: 4 Visit Information History Since Last Visit Added or deleted any medications: No Patient Arrived: Wheel Chair Had a fall or experienced change in No Arrival Time: 08:12 activities of daily living that may affect Accompanied By: son risk of falls: Transfer Assistance: Manual Hospitalized since last visit: No Patient Identification Verified: Yes Pain Present Now: No Secondary Verification Process Yes Completed: Patient Has Alerts: Yes Patient Alerts: **ALLERGIC TO LIDOCAINE** Electronic Signature(s) Signed: 03/27/2021 4:32:52 PM By: Jeanine Luz Entered By: Jeanine Luz on 03/27/2021 08:15:53 Mervyn Skeeters ([id number]) -------------------------------------------------------------------------------- Clinic Level of Care Assessment Details Patient Name: Kimberly Pen D. Date of Service: 03/27/2021 8:00 AM Medical Record Number: [id number] Patient Account Number: [id number] Date of Birth/Sex: [birth year] (74 y.o. F) Treating RN: [name] Primary Care Dartagnan Beavers: Viviana Simpler Other Clinician: Jeanine Luz Referring [name]: Viviana Simpler Treating Scot Shiraishi/Extender: Tito Dine in Treatment: 4 Clinic Level of Care Assessment Items TOOL 4 Quantity Score X - Use when only an EandM is performed on FOLLOW-UP visit 1 0 ASSESSMENTS - Nursing Assessment / Reassessment X - Reassessment of Co-morbidities (includes updates in patient status) 1 10 X- 1 5 Reassessment of Adherence to Treatment  Plan ASSESSMENTS - Wound and Skin Assessment / Reassessment X - Simple Wound Assessment / Reassessment - one wound 1 5 []  - 0 Complex Wound Assessment / Reassessment - multiple wounds []  - 0 Dermatologic / Skin Assessment (not related to wound area) ASSESSMENTS - Focused Assessment []  - Circumferential Edema Measurements - multi extremities 0 []  - 0 Nutritional Assessment / Counseling / Intervention []  - 0 Lower Extremity Assessment (monofilament, tuning fork, pulses) []  - 0 Peripheral Arterial Disease Assessment (using hand held doppler) ASSESSMENTS - Ostomy and/or Continence Assessment and Care []  - Incontinence Assessment and Management 0 []  - 0 Ostomy Care Assessment and Management (repouching, etc.) PROCESS - Coordination of Care X - Simple Patient / Family Education for ongoing care 1 15 []  - 0 Complex (extensive) Patient / Family Education for ongoing care []  - 0 Staff obtains Programmer, systems, Records, Test Results / Process Orders []  - 0 Staff telephones HHA, Nursing Homes / Clarify orders / etc []  - 0 Routine Transfer to another Facility (non-emergent condition) []  - 0 Routine Hospital Admission (non-emergent condition) []  - 0 New Admissions / Biomedical engineer / Ordering NPWT, Apligraf, etc. []  - 0 Emergency Hospital Admission (emergent condition) X- 1 10 Simple Discharge Coordination []  - 0 Complex (extensive) Discharge Coordination PROCESS - Special Needs []  - Pediatric / Minor Patient Management 0 []  - 0 Isolation Patient Management []  - 0 Hearing / Language / Visual special needs []  - 0 Assessment of Community assistance (transportation, D/C planning, etc.) []  - 0 Additional assistance / Altered mentation []  - 0 Support Surface(s) Assessment (bed, cushion, seat, etc.) INTERVENTIONS - Wound Cleansing / Measurement KHALIA, GONG D. ([id number]) X- 1 5 Simple Wound Cleansing - one wound []  - 0 Complex Wound Cleansing - multiple wounds X- 1 5 Wound  Imaging (photographs - any number of wounds) []  - 0 Wound Tracing (instead of photographs) X- 1 5 Simple Wound Measurement - one wound []  - 0 Complex Wound  Measurement - multiple wounds INTERVENTIONS - Wound Dressings []  - Small Wound Dressing one or multiple wounds 0 X- 1 15 Medium Wound Dressing one or multiple wounds []  - 0 Large Wound Dressing one or multiple wounds X- 1 5 Application of Medications - topical []  - 0 Application of Medications - injection INTERVENTIONS - Miscellaneous []  - External ear exam 0 []  - 0 Specimen Collection (cultures, biopsies, blood, body fluids, etc.) []  - 0 Specimen(s) / Culture(s) sent or taken to Lab for analysis []  - 0 Patient Transfer (multiple staff / Harrel Lemon Lift / Similar devices) []  - 0 Simple Staple / Suture removal (25 or less) []  - 0 Complex Staple / Suture removal (26 or more) []  - 0 Hypo / Hyperglycemic Management (close monitor of Blood Glucose) []  - 0 Ankle / Brachial Index (ABI) - do not check if billed separately X- 1 5 Vital Signs Has the patient been seen at the hospital within the last three years: Yes Total Score: 85 Level Of Care: New/Established - Level 3 Electronic Signature(s) Signed: 03/27/2021 4:47:42 PM By: Georges Mouse, Minus Breeding RN Entered By: Georges Mouse, Kenia on 03/27/2021 08:49:06 Mervyn Skeeters ([id number]) -------------------------------------------------------------------------------- Encounter Discharge Information Details Patient Name: Kimberly Pen D. Date of Service: 03/27/2021 8:00 AM Medical Record Number: [id number] Patient Account Number: [id number] Date of Birth/Sex: [birth year] (74 y.o. F) Treating RN: Carlene Coria Primary Care Lianni Kanaan: Viviana Simpler Other Clinician: Jeanine Luz Referring Zela Sobieski: Viviana Simpler Treating [name]/Extender: Tito Dine in Treatment: 4 Encounter Discharge Information Items Discharge Condition: Stable Ambulatory Status:  Wheelchair Discharge Destination: Home Transportation: Private Auto Accompanied By: self Schedule Follow-up Appointment: Yes Clinical Summary of Care: Patient Declined Electronic Signature(s) Signed: 03/28/2021 10:06:31 AM By: Carlene Coria RN Entered By: Carlene Coria on 03/27/2021 09:08:18 Mervyn Skeeters ([id number]) -------------------------------------------------------------------------------- Lower Extremity Assessment Details Patient Name: Kimberly Pen D. Date of Service: 03/27/2021 8:00 AM Medical Record Number: [id number] Patient Account Number: [id number] Date of Birth/Sex: 03-May-1946 (74 y.o. F) Treating RN: Cornell Barman Primary Care Prima Rayner: Viviana Simpler Other Clinician: Jeanine Luz Referring Layce Sprung: Viviana Simpler Treating Marrio Scribner/Extender: Tito Dine in Treatment: 4 Electronic Signature(s) Signed: 03/27/2021 4:32:52 PM By: Jeanine Luz Signed: 04/02/2021 6:20:31 PM By: Gretta Cool, BSN, RN, CWS, Kim RN, BSN Entered By: Jeanine Luz on 03/27/2021 08:16:44 Mervyn Skeeters ([id number]) -------------------------------------------------------------------------------- Multi Wound Chart Details Patient Name: MARKAYLA, REICHART D. Date of Service: 03/27/2021 8:00 AM Medical Record Number: [id number] Patient Account Number: [id number] Date of Birth/Sex: 10-21-1946 (74 y.o. F) Treating RN: [name] Primary Care Dollie Mayse: Viviana Simpler Other Clinician: Jeanine Luz Referring Voncille Simm: Viviana Simpler Treating Huxley Vanwagoner/Extender: Tito Dine in Treatment: 4 Vital Signs Height(in): 63 Pulse(bpm): 70 Weight(lbs): 120 Blood Pressure(mmHg): 96/65 Body Mass Index(BMI): 21 Temperature(F): 98.0 Respiratory Rate(breaths/min): 16 Photos: [N/A:N/A] Wound Location: Sacrum N/A N/A Wounding Event: Pressure Injury N/A N/A Primary Etiology: Pressure Ulcer N/A N/A Comorbid History: History of pressure wounds, N/A N/A Osteoarthritis Date  Acquired: 02/12/2021 N/A N/A Weeks of Treatment: 4 N/A N/A Wound Status: Open N/A N/A Measurements L x W x D (cm) 1x0.3x1.3 N/A N/A Area (cm) : 0.236 N/A N/A Volume (cm) : 0.306 N/A N/A % Reduction in Area: 90.00% N/A N/A % Reduction in Volume: 87.00% N/A N/A Starting Position 1 (o'clock): 1 Ending Position 1 (o'clock): 2 Maximum Distance 1 (cm): 1.6 Undermining: Yes N/A N/A Classification: Category/Stage III N/A N/A Exudate Amount: Large N/A N/A Exudate Type: Purulent N/A N/A Exudate Color: yellow, brown, green N/A N/A Wound Margin: Flat  and Intact N/A N/A Granulation Amount: Medium (34-66%) N/A N/A Granulation Quality: Pink, Pale N/A N/A Necrotic Amount: Small (1-33%) N/A N/A Exposed Structures: Fat Layer (Subcutaneous Tissue): N/A N/A Yes Fascia: No Tendon: No Muscle: No Joint: No Bone: No Epithelialization: None N/A N/A Treatment Notes Electronic Signature(s) Signed: 03/27/2021 5:03:12 PM By: Linton Ham MD Entered By: Linton Ham on 03/27/2021 08:55:42 Mervyn Skeeters ([id number]) Rondel Oh, Saddie Benders ([id number]) -------------------------------------------------------------------------------- DeBary Details Patient Name: Kimberly Pen D. Date of Service: 03/27/2021 8:00 AM Medical Record Number: [id number] Patient Account Number: [id number] Date of Birth/Sex: [birth year] (74 y.o. F) Treating RN: [name] Primary Care Aliveah Gallant: Viviana Simpler Other Clinician: Jeanine Luz Referring Tarig Zimmers: Viviana Simpler Treating Tykeria Wawrzyniak/Extender: Tito Dine in Treatment: 4 Active Inactive Necrotic Tissue Nursing Diagnoses: Impaired tissue integrity related to necrotic/devitalized tissue Knowledge deficit related to management of necrotic/devitalized tissue Goals: Necrotic/devitalized tissue will be minimized in the wound bed Date Initiated: 02/27/2021 Target Resolution Date: 03/13/2021 Goal Status:  Active Interventions: Assess patient pain level pre-, during and post procedure and prior to discharge Treatment Activities: Excisional debridement : 02/27/2021 Notes: Pressure Nursing Diagnoses: Potential for impaired tissue integrity related to pressure, friction, moisture, and shear Goals: Patient will remain free from development of additional pressure ulcers Date Initiated: 02/27/2021 Target Resolution Date: 03/13/2021 Goal Status: Active Interventions: Assess: immobility, friction, shearing, incontinence upon admission and as needed Provide education on pressure ulcers Notes: Wound/Skin Impairment Nursing Diagnoses: Impaired tissue integrity Goals: Patient/caregiver will verbalize understanding of skin care regimen Date Initiated: 02/27/2021 Date Inactivated: 03/27/2021 Target Resolution Date: 03/13/2021 Goal Status: Met Ulcer/skin breakdown will have a volume reduction of 30% by week 4 Date Initiated: 02/27/2021 Target Resolution Date: 03/23/2021 Goal Status: Active Interventions: Assess ulceration(s) every visit Treatment Activities: Skin care regimen initiated : 02/27/2021 Topical wound management initiated : 02/27/2021 ANDRELL, TALLMAN ([id number]) Notes: Electronic Signature(s) Signed: 03/27/2021 4:47:42 PM By: Georges Mouse, Minus Breeding RN Entered By: Georges Mouse, Minus Breeding on 03/27/2021 08:39:19 Kimberly Pen D. ([id number]) -------------------------------------------------------------------------------- Pain Assessment Details Patient Name: Kimberly Pen D. Date of Service: 03/27/2021 8:00 AM Medical Record Number: [id number] Patient Account Number: [id number] Date of Birth/Sex: 1946-01-23 (74 y.o. F) Treating RN: Cornell Barman Primary Care Albin Duckett: Viviana Simpler Other Clinician: Jeanine Luz Referring Rukia Mcgillivray: Viviana Simpler Treating Rozalia Dino/Extender: Tito Dine in Treatment: 4 Active Problems Location of Pain Severity and Description of  Pain Patient Has Paino No Site Locations Rate the pain. Current Pain Level: 0 Pain Management and Medication Current Pain Management: Electronic Signature(s) Signed: 03/27/2021 4:32:52 PM By: Jeanine Luz Signed: 04/02/2021 6:20:31 PM By: Gretta Cool, BSN, RN, CWS, Kim RN, BSN Entered By: Jeanine Luz on 03/27/2021 08:16:34 Mervyn Skeeters ([id number]) -------------------------------------------------------------------------------- Patient/Caregiver Education Details Patient Name: Kimberly Pen D. Date of Service: 03/27/2021 8:00 AM Medical Record Number: [id number] Patient Account Number: [id number] Date of Birth/Gender: 08/14/1946 (74 y.o. F) Treating RN: [name] Primary Care Physician: Viviana Simpler Other Clinician: Jeanine Luz Referring Physician: Viviana Simpler Treating Physician/Extender: Tito Dine in Treatment: 4 Education Assessment Education Provided To: Patient Education Topics Provided Wound/Skin Impairment: Methods: Explain/Verbal Responses: State content correctly Electronic Signature(s) Signed: 03/27/2021 4:47:42 PM By: Georges Mouse, Minus Breeding RN Entered By: Georges Mouse, Kenia on 03/27/2021 08:49:22 Mervyn Skeeters ([id number]) -------------------------------------------------------------------------------- Wound Assessment Details Patient Name: Kimberly Pen D. Date of Service: 03/27/2021 8:00 AM Medical Record Number: [id number] Patient Account Number: [id number] Date of Birth/Sex: 12-17-1945 (74 y.o. F) Treating RN: [name] Primary Care Heily Carlucci: Viviana Simpler Other Clinician: Marlyn Corporal,  Christel Mormon Referring Ryen Rhames: Viviana Simpler Treating Anzal Bartnick/Extender: Tito Dine in Treatment: 4 Wound Status Wound Number: 1 Primary Etiology: Pressure Ulcer Wound Location: Sacrum Wound Status: Open Wounding Event: Pressure Injury Comorbid History: History of pressure wounds, Osteoarthritis Date Acquired:  02/12/2021 Weeks Of Treatment: 4 Clustered Wound: No Photos Wound Measurements Length: (cm) 1 % Redu Width: (cm) 0.3 % Redu Depth: (cm) 1.3 Epithe Area: (cm) 0.236 Under Volume: (cm) 0.306 St End Max ction in Area: 90% ction in Volume: 87% lialization: None mining: Yes arting Position (o'clock): 1 ing Position (o'clock): 2 imum Distance: (cm) 1.6 Wound Description Classification: Category/Stage III Foul Wound Margin: Flat and Intact Sloug Exudate Amount: Large Exudate Type: Purulent Exudate Color: yellow, brown, green Odor After Cleansing: No h/Fibrino No Wound Bed Granulation Amount: Medium (34-66%) Exposed Structure Granulation Quality: Pink, Pale Fascia Exposed: No Necrotic Amount: Small (1-33%) Fat Layer (Subcutaneous Tissue) Exposed: Yes Tendon Exposed: No Muscle Exposed: No Joint Exposed: No Bone Exposed: No Treatment Notes Wound #1 (Sacrum) Cleanser Soap and Water AMONI, SCALLAN ([id number]) Discharge Instruction: Gently cleanse wound with antibacterial soap, rinse and pat dry prior to dressing wounds Peri-Wound Care Topical Santyl Collagenase Ointment, 30 (gm), tube Discharge Instruction: apply nickel thick to wound bed only Primary Dressing Secondary Dressing Secured With Compression Wrap Compression Stockings Add-Ons Electronic Signature(s) Signed: 03/27/2021 4:47:42 PM By: Georges Mouse, Minus Breeding RN Entered By: Georges Mouse, Minus Breeding on 03/27/2021 08:40:50 Kimberly Pen D. ([id number]) -------------------------------------------------------------------------------- Vitals Details Patient Name: Kimberly Pen D. Date of Service: 03/27/2021 8:00 AM Medical Record Number: [id number] Patient Account Number: [id number] Date of Birth/Sex: 11-14-1946 (74 y.o. F) Treating RN: Cornell Barman Primary Care Nicko Daher: Viviana Simpler Other Clinician: Jeanine Luz Referring Cheyenne Schumm: Viviana Simpler Treating Rumaisa Schnetzer/Extender: Tito Dine in  Treatment: 4 Vital Signs Time Taken: 08:16 Temperature (F): 98.0 Height (in): 63 Pulse (bpm): 70 Weight (lbs): 120 Respiratory Rate (breaths/min): 16 Body Mass Index (BMI): 21.3 Blood Pressure (mmHg): 96/65 Reference Range: 80 - 120 mg / dl Electronic Signature(s) Signed: 03/27/2021 4:32:52 PM By: Jeanine Luz Entered By: Jeanine Luz on 03/27/2021 08:16:27

## 2021-04-10 ENCOUNTER — Other Ambulatory Visit: Payer: Self-pay

## 2021-04-10 ENCOUNTER — Encounter: Payer: Medicare Other | Admitting: Internal Medicine

## 2021-04-10 DIAGNOSIS — L89221 Pressure ulcer of left hip, stage 1: Secondary | ICD-10-CM | POA: Diagnosis not present

## 2021-04-10 NOTE — Progress Notes (Signed)
[name] ([id number]) Visit Report for 04/10/2021 Arrival Information Details Patient Name: [name], [name]. Date of Service: 04/10/2021 9:45 AM Medical Record Number: [id number] Patient Account Number: [id number] Date of Birth/Sex: 14-May-1946 (75 y.o. F) Treating RN: Cornell Barman Primary Care Afton Lavalle: Viviana Simpler Other Clinician: Jeanine Luz Referring Emalia Witkop: Viviana Simpler Treating Timo Hartwig/Extender: Tito Dine in Treatment: 6 Visit Information History Since Last Visit Added or deleted any medications: No Patient Arrived: Wheel Chair Had a fall or experienced change in No Arrival Time: 09:52 activities of daily living that may affect Accompanied By: son risk of falls: Transfer Assistance: Manual Hospitalized since last visit: No Patient Identification Verified: Yes Pain Present Now: No Secondary Verification Process Completed: Yes Patient Requires Transmission-Based No Precautions: Patient Has Alerts: Yes Patient Alerts: **ALLERGIC TO LIDOCAINE** Electronic Signature(s) Signed: 04/10/2021 4:53:53 PM By: Jeanine Luz Entered By: Jeanine Luz on 04/10/2021 09:52:57 Mervyn Skeeters ([id number]) -------------------------------------------------------------------------------- Clinic Level of Care Assessment Details Patient Name: [name] D. Date of Service: 04/10/2021 9:45 AM Medical Record Number: [id number] Patient Account Number: [id number] Date of Birth/Sex: 1946-08-09 (75 y.o. F) Treating RN: Dolan Amen Primary Care Madaleine Simmon: Viviana Simpler Other Clinician: Jeanine Luz Referring Jammie Clink: Viviana Simpler Treating Deone Leifheit/Extender: Tito Dine in Treatment: 6 Clinic Level of Care Assessment Items TOOL 1 Quantity Score [] - Use when EandM and Procedure is performed on INITIAL visit 0 ASSESSMENTS - Nursing Assessment / Reassessment [] - General Physical Exam (combine w/ comprehensive assessment (listed  just below) when performed on new 0 pt. evals) [] - 0 Comprehensive Assessment (HX, ROS, Risk Assessments, Wounds Hx, etc.) ASSESSMENTS - Wound and Skin Assessment / Reassessment [] - Dermatologic / Skin Assessment (not related to wound area) 0 ASSESSMENTS - Ostomy and/or Continence Assessment and Care [] - Incontinence Assessment and Management 0 [] - 0 Ostomy Care Assessment and Management (repouching, etc.) PROCESS - Coordination of Care [] - Simple Patient / Family Education for ongoing care 0 [] - 0 Complex (extensive) Patient / Family Education for ongoing care [] - 0 Staff obtains Consents, Records, Test Results / Process Orders [] - 0 Staff telephones HHA, Nursing Homes / Clarify orders / etc [] - 0 Routine Transfer to another Facility (non-emergent condition) [] - 0 Routine Hospital Admission (non-emergent condition) [] - 0 New Admissions / Biomedical engineer / Ordering NPWT, Apligraf, etc. [] - 0 Emergency Hospital Admission (emergent condition) PROCESS - Special Needs [] - Pediatric / Minor Patient Management 0 [] - 0 Isolation Patient Management [] - 0 Hearing / Language / Visual special needs [] - 0 Assessment of Community assistance (transportation, D/C planning, etc.) [] - 0 Additional assistance / Altered mentation [] - 0 Support Surface(s) Assessment (bed, cushion, seat, etc.) INTERVENTIONS - Miscellaneous [] - External ear exam 0 [] - 0 Patient Transfer (multiple staff / Civil Service fast streamer / Similar devices) [] - 0 Simple Staple / Suture removal (25 or less) [] - 0 Complex Staple / Suture removal (26 or more) [] - 0 Hypo/Hyperglycemic Management (do not check if billed separately) [] - 0 Ankle / Brachial Index (ABI) - do not check if billed separately Has the patient been seen at the hospital within the last three years: Yes Total Score: 0 Level Of Care: ____ Mervyn Skeeters ([id number]) Electronic Signature(s) Signed: 04/10/2021 12:38:58 PM  By: Georges Mouse, Minus Breeding RN Entered By: Georges Mouse, Minus Breeding on 04/10/2021 10:23:39 Mervyn Skeeters ([id number]) -------------------------------------------------------------------------------- Encounter Discharge Information Details Patient Name: [name]  D. Date of Service: 04/10/2021 9:45 AM Medical Record Number: [id number] Patient Account Number: [id number] Date of Birth/Sex: Oct 16, 1946 (75 y.o. F) Treating RN: Dolan Amen Primary Care Manraj Yeo: Viviana Simpler Other Clinician: Jeanine Luz Referring Lakota Markgraf: Viviana Simpler Treating Ramsha Lonigro/Extender: Tito Dine in Treatment: 6 Encounter Discharge Information Items Discharge Condition: Stable Ambulatory Status: Wheelchair Discharge Destination: Home Transportation: Private Auto Accompanied By: son Schedule Follow-up Appointment: Yes Clinical Summary of Care: Electronic Signature(s) Signed: 04/10/2021 12:38:58 PM By: Georges Mouse, Minus Breeding RN Entered By: Georges Mouse, Minus Breeding on 04/10/2021 10:24:34 Mervyn Skeeters ([id number]) -------------------------------------------------------------------------------- Lower Extremity Assessment Details Patient Name: [name] D. Date of Service: 04/10/2021 9:45 AM Medical Record Number: [id number] Patient Account Number: [id number] Date of Birth/Sex: [birth year] (75 y.o. F) Treating RN: Cornell Barman Primary Care Lesslie Mossa: Viviana Simpler Other Clinician: Jeanine Luz Referring Aureliano Oshields: Viviana Simpler Treating Kiondre Grenz/Extender: Tito Dine in Treatment: 6 Electronic Signature(s) Signed: 04/10/2021 4:08:37 PM By: Gretta Cool BSN, RN, CWS, Kim RN, BSN Signed: 04/10/2021 4:53:53 PM By: Jeanine Luz Entered By: Jeanine Luz on 04/10/2021 09:53:41 [name] D. ([id number]) -------------------------------------------------------------------------------- Multi Wound Chart Details Patient Name: [name] D. Date of Service:  04/10/2021 9:45 AM Medical Record Number: [id number] Patient Account Number: [id number] Date of Birth/Sex: [birth year] (75 y.o. F) Treating RN: Dolan Amen Primary Care Elvenia Godden: Viviana Simpler Other Clinician: Jeanine Luz Referring Gabor Lusk: Viviana Simpler Treating Astin Sayre/Extender: Tito Dine in Treatment: 6 Vital Signs Height(in): 63 Pulse(bpm): 97 Weight(lbs): 120 Blood Pressure(mmHg): 96/68 Body Mass Index(BMI): 21 Temperature(F): 98.1 Respiratory Rate(breaths/min): 16 Photos: [N/A:N/A] Wound Location: Sacrum N/A N/A Wounding Event: Pressure Injury N/A N/A Primary Etiology: Pressure Ulcer N/A N/A Comorbid History: History of pressure wounds, N/A N/A Osteoarthritis Date Acquired: 02/12/2021 N/A N/A Weeks of Treatment: 6 N/A N/A Wound Status: Open N/A N/A Measurements L x W x D (cm) 0.7x0.3x0.6 N/A N/A Area (cm) : 0.165 N/A N/A Volume (cm) : 0.099 N/A N/A % Reduction in Area: 93.00% N/A N/A % Reduction in Volume: 95.80% N/A N/A Starting Position 1 (o'clock): 2 Ending Position 1 (o'clock): 3 Maximum Distance 1 (cm): 0.9 Undermining: Yes N/A N/A Classification: Category/Stage III N/A N/A Exudate Amount: Medium N/A N/A Exudate Type: Serous N/A N/A Exudate Color: amber N/A N/A Wound Margin: Flat and Intact N/A N/A Granulation Amount: Medium (34-66%) N/A N/A Granulation Quality: Pink, Pale N/A N/A Necrotic Amount: Small (1-33%) N/A N/A Exposed Structures: Fat Layer (Subcutaneous Tissue): N/A N/A Yes Fascia: No Tendon: No Muscle: No Joint: No Bone: No Epithelialization: None N/A N/A Procedures Performed: Negative Pressure Wound Therapy N/A N/A Maintenance (NPWT) Treatment Notes Wound #1 (Sacrum) Cleanser TRECIA, MARING ([id number]) Peri-Wound Care Topical Primary Dressing Secondary Dressing Secured With Compression Wrap Compression Stockings Add-Ons Electronic Signature(s) Signed: 04/10/2021 5:04:28 PM By: Linton Ham  MD Entered By: Linton Ham on 04/10/2021 10:29:05 Mervyn Skeeters ([id number]) -------------------------------------------------------------------------------- Multi-Disciplinary Care Plan Details Patient Name: [name] D. Date of Service: 04/10/2021 9:45 AM Medical Record Number: [id number] Patient Account Number: [id number] Date of Birth/Sex: 1946/11/04 (75 y.o. F) Treating RN: Dolan Amen Primary Care Kristyna Bradstreet: Viviana Simpler Other Clinician: Jeanine Luz Referring Burnice Vassel: Viviana Simpler Treating Thelia Tanksley/Extender: Tito Dine in Treatment: 6 Active Inactive Necrotic Tissue Nursing Diagnoses: Impaired tissue integrity related to necrotic/devitalized tissue Knowledge deficit related to management of necrotic/devitalized tissue Goals: Necrotic/devitalized tissue will be minimized in the wound bed Date Initiated: 02/27/2021 Target Resolution Date: 03/13/2021 Goal Status: Active Interventions: Assess patient pain level pre-, during and post procedure and prior to discharge  Treatment Activities: Excisional debridement : 02/27/2021 Notes: Pressure Nursing Diagnoses: Potential for impaired tissue integrity related to pressure, friction, moisture, and shear Goals: Patient will remain free from development of additional pressure ulcers Date Initiated: 02/27/2021 Target Resolution Date: 03/13/2021 Goal Status: Active Interventions: Assess: immobility, friction, shearing, incontinence upon admission and as needed Provide education on pressure ulcers Notes: Wound/Skin Impairment Nursing Diagnoses: Impaired tissue integrity Goals: Patient/caregiver will verbalize understanding of skin care regimen Date Initiated: 02/27/2021 Date Inactivated: 03/27/2021 Target Resolution Date: 03/13/2021 Goal Status: Met Ulcer/skin breakdown will have a volume reduction of 30% by week 4 Date Initiated: 02/27/2021 Target Resolution Date: 03/23/2021 Goal Status:  Active Interventions: Assess ulceration(s) every visit Treatment Activities: Skin care regimen initiated : 02/27/2021 Topical wound management initiated : 02/27/2021 NAVA, SONG ([id number]) Notes: Electronic Signature(s) Signed: 04/10/2021 12:38:58 PM By: Georges Mouse, Minus Breeding RN Entered By: Georges Mouse, Minus Breeding on 04/10/2021 10:20:00 [name] D. ([id number]) -------------------------------------------------------------------------------- Negative Pressure Wound Therapy Maintenance (NPWT) Details Patient Name: ANEYA, DADDONA. Date of Service: 04/10/2021 9:45 AM Medical Record Number: [id number] Patient Account Number: [id number] Date of Birth/Sex: 1946/10/19 (75 y.o. F) Treating RN: Dolan Amen Primary Care Riot Barrick: Viviana Simpler Other Clinician: Jeanine Luz Referring Zolton Dowson: Viviana Simpler Treating Yoko Mcgahee/Extender: Tito Dine in Treatment: 6 NPWT Maintenance Performed for: Wound #1 Sacrum Performed By: Dolan Amen, RN Type: Other Coverage Size (sq cm): 0.21 Pressure Type: Constant Pressure Setting: 125 mmHG Drain Type: None Sponge/Dressing Type: Foam, Blue Date Initiated: 04/03/2021 Dressing Removed: Yes Quantity of Sponges/Gauze Removed: 1 Canister Changed: Yes Canister Exudate Volume: 11 Dressing Reapplied: Yes Quantity of Sponges/Gauze Inserted: 1 Respones To Treatment: pt tolerated well Days On NPWT: 8 Post Procedure Diagnosis Same as Pre-procedure Electronic Signature(s) Signed: 04/10/2021 12:38:58 PM By: Georges Mouse, Minus Breeding RN Entered By: Georges Mouse, Minus Breeding on 04/10/2021 10:22:58 [name] D. ([id number]) -------------------------------------------------------------------------------- Pain Assessment Details Patient Name: [name] D. Date of Service: 04/10/2021 9:45 AM Medical Record Number: [id number] Patient Account Number: [id number] Date of Birth/Sex: [birth year] (75 y.o. F) Treating RN: Cornell Barman Primary Care Burdett Pinzon: Viviana Simpler Other Clinician: Jeanine Luz Referring Nataliee Shurtz: Viviana Simpler Treating Yianni Skilling/Extender: Tito Dine in Treatment: 6 Active Problems Location of Pain Severity and Description of Pain Patient Has Paino No Site Locations Rate the pain. Current Pain Level: 0 Pain Management and Medication Current Pain Management: Electronic Signature(s) Signed: 04/10/2021 4:08:37 PM By: Gretta Cool, BSN, RN, CWS, Kim RN, BSN Signed: 04/10/2021 4:53:53 PM By: Jeanine Luz Entered By: Jeanine Luz on 04/10/2021 09:53:32 Mervyn Skeeters ([id number]) -------------------------------------------------------------------------------- Patient/Caregiver Education Details Patient Name: [name] D. Date of Service: 04/10/2021 9:45 AM Medical Record Number: [id number] Patient Account Number: [id number] Date of Birth/Gender: [birth year] (75 y.o. F) Treating RN: Dolan Amen Primary Care Physician: Viviana Simpler Other Clinician: Jeanine Luz Referring Physician: Viviana Simpler Treating Physician/Extender: Tito Dine in Treatment: 6 Education Assessment Education Provided To: Patient Education Topics Provided Wound/Skin Impairment: Methods: Explain/Verbal Responses: State content correctly Electronic Signature(s) Signed: 04/10/2021 12:38:58 PM By: Georges Mouse, Minus Breeding RN Entered By: Georges Mouse, Minus Breeding on 04/10/2021 10:23:55 Mervyn Skeeters ([id number]) -------------------------------------------------------------------------------- Wound Assessment Details Patient Name: [name] D. Date of Service: 04/10/2021 9:45 AM Medical Record Number: [id number] Patient Account Number: [id number] Date of Birth/Sex: Jul 29, 1946 (75 y.o. F) Treating RN: Cornell Barman Primary Care Cyris Maalouf: Viviana Simpler Other Clinician: Jeanine Luz Referring Oshae Simmering: Viviana Simpler Treating Tinya Cadogan/Extender: Tito Dine in Treatment: 6 Wound Status Wound Number: 1 Primary Etiology: Pressure Ulcer Wound Location:  Sacrum Wound Status: Open Wounding Event: Pressure Injury Comorbid History: History of pressure wounds, Osteoarthritis Date Acquired: 02/12/2021 Weeks Of Treatment: 6 Clustered Wound: No Photos Wound Measurements Length: (cm) 0.7 % Red Width: (cm) 0.3 % Red Depth: (cm) 0.6 Epith Area: (cm) 0.165 Tunn Volume: (cm) 0.099 Un[address] En Ma uction in Area: 93% uction in Volume: 95.8% elialization: None eling: No rmining: Yes arting Position (o'clock): 2 ding Position (o'clock): 3 ximum Distance: (cm) 0.9 Wound Description Classification: Category/Stage III Foul Wound Margin: Flat and Intact Slou Exudate Amount: Medium Exudate Type: Serous Exudate Color: amber Odor After Cleansing: No gh/Fibrino No Wound Bed Granulation Amount: Medium (34-66%) Exposed Structure Granulation Quality: Pink, Pale Fascia Exposed: No Necrotic Amount: Small (1-33%) Fat Layer (Subcutaneous Tissue) Exposed: Yes Tendon Exposed: No Muscle Exposed: No Joint Exposed: No Bone Exposed: No Treatment Notes Wound #1 (Sacrum) Cleanser KRYSTALLE, [place] ([id number]) Peri-Wound Care Topical Primary Dressing Secondary Dressing Secured With Compression Wrap Compression Stockings Add-Ons Electronic Signature(s) Signed: 04/10/2021 4:08:37 PM By: Gretta Cool, BSN, RN, CWS, Kim RN, BSN Signed: 04/10/2021 4:53:53 PM By: Jeanine Luz Entered By: Jeanine Luz on 04/10/2021 10:06:08 Mervyn Skeeters ([id number]) -------------------------------------------------------------------------------- Vitals Details Patient Name: [name] D. Date of Service: 04/10/2021 9:45 AM Medical Record Number: [id number] Patient Account Number: [id number] Date of Birth/Sex: 1946-01-03 (75 y.o. F) Treating RN: Cornell Barman Primary Care Joanmarie Tsang: Viviana Simpler Other Clinician: Jeanine Luz Referring Der Gagliano:  Viviana Simpler Treating Francene Mcerlean/Extender: Tito Dine in Treatment: 6 Vital Signs Time Taken: 09:50 Temperature (F): 98.1 Height (in): 63 Pulse (bpm): 97 Weight (lbs): 120 Respiratory Rate (breaths/min): 16 Body Mass Index (BMI): 21.3 Blood Pressure (mmHg): 96/68 Reference Range: 80 - 120 mg / dl Electronic Signature(s) Signed: 04/10/2021 4:53:53 PM By: Jeanine Luz Entered By: Jeanine Luz on 04/10/2021 09:53:22

## 2021-04-10 NOTE — Telephone Encounter (Signed)
Received call from patient stating that she would like to cancel her 5/17 Xeomin injection because she is currently under the care of a wound specialist. She does not wish to reschedule at this time.

## 2021-04-10 NOTE — Progress Notes (Signed)
SAVAHANNA, ALMENDARIZ ([id number]) Visit Report for 04/10/2021 HPI Details Patient Name: [name], [name]. Date of Service: 04/10/2021 9:45 AM Medical Record Number: [id number] Patient Account Number: [id number] Date of Birth/Sex: Apr 27, 1946 (75 y.o. F) Treating RN: Huel Coventry Primary Care Provider: Tillman Abide Other Clinician: Lolita Cram Referring Provider: Tillman Abide Treating Provider/Extender: Altamese [place] in Treatment: 6 History of Present Illness HPI Description: Admission 02/27/2021 This woman is a 75 year old woman with fairly advanced multiple sclerosis that was diagnosed in 1982 follows with neurology. In October of last year she had an ORIF of a tib-fib fracture and apparently a shoulder replacement. She went to [facility] for rehabilitation is just come home 2 weeks ago. Unbeknownst to the patient or her son they noticed a wound on her sacrum. Her son is able to show me pictures on the phone generally small wound slightly bigger than a dime however completely necrotic surface. They are not exactly sure what they are using on this wound. There is a note from primary care I think a telephone conversation that they sent in [place] on 02/15/2021 although both the patient and her son think it is Sylvant which is something I am really not familiar with. They said it was $1300 at [facility] they ended up paying $300 after insurance. Even for Santyl this would be expense Also the patient has come home a lot more physically challenged. 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 This will make it difficult to keep the pressure off this area. They have [facility] and they are getting a physical therapist out. Past medical history includes; multiple sclerosis ankle fractures, disc herniation, mitral valve prolapse, nephrolithiasis, osteoporosis, rib fractures. 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 We have been using silver collagen. As it turns out the ointment that you are using was Santyl. I told him not to shoot this out as we may need to go back to this. It sounds like they are religiously offloading this only up 1 hour in her chair for meals. Also careful offloading in the wheelchair. 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 We have been using silver collagen after she came in on Santyl. This is a deep wound and there is undermining at the base that is difficult to measure because of the small surface area. Still using silver collagen. We are going to explore a snap VAC and/or Oasis through insurance. 4/6; deep stage III or IV wound over the sacrum. It is really difficult to tell given the small surface area orifice of the. We have been using silver collagen the last 2 weeks. The patient is very disabled with marked hip and knee flexion contractures nevertheless they are being fairly 8 religious I think about offloading this correctly. I am not totally sure about her nutritional parameters. She is completing the antibiotics we initially gave her for Streptococcus intermedius which was Augmentin. I think that can be discontinued she had a 10-day course 4/13; the snapvac came off 2 days ago. Patient son was unable to replace this. I he put a dressing in there I am not sure which. The area looks as though it is contracted although there is still depth and undermining. He also shows [place] an area on her left greater trochanter 04/03/2021 upon evaluation today patient appears to be doing well currently in regard to her wound in the sacral region. She  has been tolerating the snap VAC without complication. I do not see anything right now that Santyl seems to be doing for her to be perfectly honest. I think that we may want to switch to adding a little bit of collagen underneath the Stafac I think that would be absolutely appropriate. 4/27;  the patient's wound is quite a bit smaller in terms of surface area than the last time I saw this. The orifice is too small to really do any form of examination of the wound bed. Nevertheless the depth of this is 0.6 cm which is quite a bit of an improvement since even last week down 50% and overall did. Her son tells me they are keeping the snap VAC on for the most part although if it comes off at night he is able to replace it Electronic Signature(s) Signed: 04/10/2021 5:04:28 PM By: Baltazar Najjar MD Entered By: Baltazar Najjar on 04/10/2021 10:30:21 Arsenio Loader ([id number]) -------------------------------------------------------------------------------- Physical Exam Details Patient Name: Kimberly Lamb D. Date of Service: 04/10/2021 9:45 AM Medical Record Number: [id number] Patient Account Number: [id number] Date of Birth/Sex: Aug 17, 1946 (75 y.o. F) Treating RN: Huel Coventry Primary Care Provider: Tillman Abide Other Clinician: Lolita Cram Referring Provider: Tillman Abide Treating Provider/Extender: Altamese [place] in Treatment: 6 Constitutional Patient is hypotensive. However she appears stable.. Pulse regular and within target range for patient.Marland Kitchen Respirations regular, non-labored and within target range.. Temperature is normal and within the target range for the patient.Marland Kitchen appears in no distress. Notes Wound exam; the orifice of this is simply too small to really do any meaningful evaluation of the wound. Using a Q-tip I measured the depth of 0.7 cm, our intake nurse got 0.6 this does not go to bone there is no purulence. Hopefully the baseline undermining that was present initially has closed over as well although it is impossible to really determine this. There is no surrounding erythema no tenderness no purulence no cultures were necessary Electronic Signature(s) Signed: 04/10/2021 5:04:28 PM By: Baltazar Najjar MD Entered By: Baltazar Najjar on 04/10/2021  10:31:35 Arsenio Loader ([id number]) -------------------------------------------------------------------------------- Physician Orders Details Patient Name: Kimberly Lamb D. Date of Service: 04/10/2021 9:45 AM Medical Record Number: [id number] Patient Account Number: [id number] Date of Birth/Sex: 12-15-1946 (75 y.o. F) Treating RN: Rogers Blocker Primary Care Provider: Tillman Abide Other Clinician: Lolita Cram Referring Provider: Tillman Abide Treating Provider/Extender: Altamese [place] in Treatment: 6 Verbal / Phone Orders: No Diagnosis Coding Follow-up Appointments o Return Appointment in 1 week. Home Health o Home Health Company: - [facility] for one week. Snap Vac Applied. o CONTINUE Home Health for wound care. May utilize formulary equivalent dressing for wound treatment orders unless otherwise specified. Home Health Nurse may visit PRN to address patientos wound care needs. - Wound Care Orders o Scheduled days for dressing changes to be completed; exception, patient has scheduled wound care visit that day. o **Please direct any NON-WOUND related issues/requests for orders to patient's Primary Care Physician. **If current dressing causes regression in wound condition, may D/C ordered dressing product/s and apply Normal Saline Moist Dressing daily until next Wound Healing Center or Other MD appointment. **Notify Wound Healing Center of regression in wound condition at [phone]. Bathing/ Shower/ Hygiene o May shower; gently cleanse wound with antibacterial soap, rinse and pat dry prior to dressing wounds Off-Loading o Hospital bed/mattress o Low air-loss mattress (Group 2) o Turn and reposition every 2 hours Additional Orders / Instructions o Follow Nutritious Diet and Increase Protein Intake  Negative Pressure Wound Therapy o Snap Vac applied o Number of foam/gauze pieces used in the dressing = - 1 Wound Treatment Electronic  Signature(s) Signed: 04/10/2021 12:38:58 PM By: Phillis Haggis, Dondra Prader RN Signed: 04/10/2021 5:04:28 PM By: Baltazar Najjar MD Entered By: Phillis Haggis, Dondra Prader on 04/10/2021 10:23:34 Arsenio Loader ([id number]) -------------------------------------------------------------------------------- Problem List Details Patient Name: IVON, ROEDEL D. Date of Service: 04/10/2021 9:45 AM Medical Record Number: [id number] Patient Account Number: [id number] Date of Birth/Sex: 12-15-1946 (75 y.o. F) Treating RN: Huel Coventry Primary Care Provider: Tillman Abide Other Clinician: Lolita Cram Referring Provider: Tillman Abide Treating Provider/Extender: Altamese [place] in Treatment: 6 Active Problems ICD-10 Encounter Code Description Active Date MDM Diagnosis L89.153 Pressure ulcer of sacral region, stage 3 02/27/2021 No Yes G35 Multiple sclerosis 02/27/2021 No Yes L89.221 Pressure ulcer of left hip, stage 1 03/27/2021 No Yes Inactive Problems Resolved Problems Electronic Signature(s) Signed: 04/10/2021 5:04:28 PM By: Baltazar Najjar MD Entered By: Baltazar Najjar on 04/10/2021 10:28:58 Arsenio Loader ([id number]) -------------------------------------------------------------------------------- Progress Note Details Patient Name: Kimberly Lamb D. Date of Service: 04/10/2021 9:45 AM Medical Record Number: [id number] Patient Account Number: [id number] Date of Birth/Sex: September 25, 1946 (75 y.o. F) Treating RN: Huel Coventry Primary Care Provider: Tillman Abide Other Clinician: Lolita Cram Referring Provider: Tillman Abide Treating Provider/Extender: Altamese [place] in Treatment: 6 Subjective History of Present Illness (HPI) Admission 02/27/2021 This woman is a 75 year old woman with fairly advanced multiple sclerosis that was diagnosed in 1982 follows with neurology. In October of last year she had an ORIF of a tib-fib fracture and apparently a shoulder replacement.  She went to [facility] for rehabilitation is just come home 2 weeks ago. Unbeknownst to the patient or her son they noticed a wound on her sacrum. Her son is able to show me pictures on the phone generally small wound slightly bigger than a dime however completely necrotic surface. They are not exactly sure what they are using on this wound. There is a note from primary care I think a telephone conversation that they sent in [place] on 02/15/2021 although both the patient and her son think it is Sylvant which is something I am really not familiar with. They said it was $1300 at [facility] they ended up paying $300 after insurance. Even for Santyl this would be expense Also the patient has come home a lot more physically challenged. 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 This will make it difficult to keep the pressure off this area. They have [facility] and they are getting a physical therapist out. Past medical history includes; multiple sclerosis ankle fractures, disc herniation, mitral valve prolapse, nephrolithiasis, osteoporosis, rib fractures. 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 We have been using silver collagen. As it turns out the ointment that you are using was Santyl. I told him not to shoot this out as we may need to go back to this. It sounds like they are religiously offloading this only up 1 hour in her chair for meals. Also careful offloading in the wheelchair. 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 We have been using silver collagen after she came in on Santyl. This is a deep wound and there is undermining at the base that is difficult to measure because of the small surface area. Still using silver collagen. We are going to explore a snap VAC and/or Oasis through insurance. 4/6;  deep stage III or IV wound over the sacrum. It is really difficult to tell  given the small surface area orifice of the. We have been using silver collagen the last 2 weeks. The patient is very disabled with marked hip and knee flexion contractures nevertheless they are being fairly 8 religious I think about offloading this correctly. I am not totally sure about her nutritional parameters. She is completing the antibiotics we initially gave her for Streptococcus intermedius which was Augmentin. I think that can be discontinued she had a 10-day course 4/13; the snapvac came off 2 days ago. Patient son was unable to replace this. I he put a dressing in there I am not sure which. The area looks as though it is contracted although there is still depth and undermining. He also shows us an area on her left greater trochanter 04/03/2021 upon evaluation today patient appears to be doing well currently in regard to her wound in the sacral region. She has been tolerating the snap VAC without complication. I do not see anything right now that Santyl seems to be doing for her to be perfectly honest. I think that we may want to switch to adding a little bit of collagen underneath the Stafac I think that would be absolutely appropriate. 4/27; the patient's wound is quite a bit smaller in terms of surface area than the last time I saw this. The orifice is too small to really do any form of examination of the wound bed. Nevertheless the depth of this is 0.6 cm which is quite a bit of an improvement since even last week down 50% and overall did. Her son tells me they are keeping the snap VAC on for the most part although if it comes off at night he is able to replace it Objective Constitutional Patient is hypotensive. However she appears stable.. Pulse regular and within target range for patient.Marland Kitchen. Respirations regular, non-labored and within target range.. Temperature is normal and within the target range for the patient.Marland Kitchen. appears in no distress. Vitals Time Taken: 9:50 AM, Height: 63 in,  Weight: 120 lbs, BMI: 21.3, Temperature: 98.1 F, Pulse: 97 bpm, Respiratory Rate: 16 breaths/min, Blood Pressure: 96/68 mmHg. General Notes: Wound exam; the orifice of this is simply too small to really do any meaningful evaluation of the wound. Using a Q-tip I measured the depth of 0.7 cm, our intake nurse got 0.6 this does not go to bone there is no purulence. Hopefully the baseline undermining that was present initially has closed over as well although it is impossible to really determine this. There is no surrounding erythema no tenderness no purulence no cultures were necessary Arsenio LoaderBATEMAN, Tayelor D. ([id number]) Integumentary (Hair, Skin) Wound #1 status is Open. Original cause of wound was Pressure Injury. The date acquired was: 02/12/2021. The wound has been in treatment 6 weeks. The wound is located on the Sacrum. The wound measures 0.7cm length x 0.3cm width x 0.6cm depth; 0.165cm^2 area and 0.099cm^3 volume. There is Fat Layer (Subcutaneous Tissue) exposed. There is no tunneling noted, however, there is undermining starting at 2:00 and ending at 3:00 with a maximum distance of 0.9cm. There is a medium amount of serous drainage noted. The wound margin is flat and intact. There is medium (34-66%) pink, pale granulation within the wound bed. There is a small (1-33%) amount of necrotic tissue within the wound bed. Assessment Active Problems ICD-10 Pressure ulcer of sacral region, stage 3 Multiple sclerosis Pressure ulcer of left hip, stage  1 Plan Follow-up Appointments: Return Appointment in 1 week. Home Health: Home Health Company: - [facility] for one week. Snap Vac Applied. CONTINUE Home Health for wound care. May utilize formulary equivalent dressing for wound treatment orders unless otherwise specified. Home Health Nurse may visit PRN to address patient s wound care needs. - Wound Care Orders Scheduled days for dressing changes to be completed; exception, patient has scheduled  wound care visit that day. **Please direct any NON-WOUND related issues/requests for orders to patient's Primary Care Physician. **If current dressing causes regression in wound condition, may D/C ordered dressing product/s and apply Normal Saline Moist Dressing daily until next Wound Healing Center or Other MD appointment. **Notify Wound Healing Center of regression in wound condition at [phone]. Bathing/ Shower/ Hygiene: May shower; gently cleanse wound with antibacterial soap, rinse and pat dry prior to dressing wounds Off-Loading: Hospital bed/mattress Low air-loss mattress (Group 2) Turn and reposition every 2 hours Additional Orders / Instructions: Follow Nutritious Diet and Increase Protein Intake Negative Pressure Wound Therapy: Snap Vac applied Number of foam/gauze pieces used in the dressing = - 1 #1 continue the snap VAC. At this point I do not think any ancillary dressing either silver collagen or Santyl is necessary. 2. Careful attention to the depth of this next time 0.6 cm today is quite a bit of an improvement from last week 3. No evidence currently of infection Electronic Signature(s) Signed: 04/10/2021 5:04:28 PM By: Baltazar Najjar MD Entered By: Baltazar Najjar on 04/10/2021 10:32:27 Arsenio Loader ([id number]) -------------------------------------------------------------------------------- SuperBill Details Patient Name: Kimberly Lamb D. Date of Service: 04/10/2021 Medical Record Number: [id number] Patient Account Number: [id number] Date of Birth/Sex: 1946-09-15 (75 y.o. F) Treating RN: Rogers Blocker Primary Care Provider: Tillman Abide Other Clinician: Lolita Cram Referring Provider: Tillman Abide Treating Provider/Extender: Altamese [place] in Treatment: 6 Diagnosis Coding ICD-10 Codes Code Description [phone] Pressure ulcer of sacral region, stage 3 G35 Multiple sclerosis L89.221 Pressure ulcer of left hip, stage 1 Facility  Procedures CPT4 Code: [id number] Description: 62376 - WOUND VAC-50 SQ CM OR LESS Modifier: Quantity: 1 CPT4 Code: Description: ICD-10 Diagnosis Description L89.153 Pressure ulcer of sacral region, stage 3 Modifier: Quantity: Physician Procedures CPT4 Code: [id number] Description: 99213 - WC PHYS LEVEL 3 - EST PT Modifier: Quantity: 1 CPT4 Code: Description: ICD-10 Diagnosis Description L89.153 Pressure ulcer of sacral region, stage 3 G35 Multiple sclerosis Modifier: Quantity: CPT4 Code: [id number] Description: 97605 - WC PHYS TX WOUND VAC < 50 SQ CM Modifier: Quantity: 1 CPT4 Code: Description: ICD-10 Diagnosis Description L89.153 Pressure ulcer of sacral region, stage 3 Modifier: Quantity: Electronic Signature(s) Signed: 04/10/2021 5:04:28 PM By: Baltazar Najjar MD Entered By: Baltazar Najjar on 04/10/2021 10:32:47

## 2021-04-10 NOTE — Progress Notes (Signed)
KAITELYN, JAMISON ([id number]) Visit Report for 04/03/2021 Arrival Information Details Patient Name: [name], [name]. Date of Service: 04/03/2021 2:45 PM Medical Record Number: [id number] Patient Account Number: [id number] Date of Birth/Sex: Apr 09, 1946 (75 y.o. F) Treating RN: Carlene Coria Primary Care Shanyia Stines: Viviana Simpler Other Clinician: Jeanine Luz Referring Grayson White: Viviana Simpler Treating Jabbar Palmero/Extender: Skipper Cliche in Treatment: 5 Visit Information History Since Last Visit All ordered tests and consults were completed: No Patient Arrived: Wheel Chair Added or deleted any medications: No Arrival Time: 14:54 Any new allergies or adverse reactions: No Accompanied By: son Had a fall or experienced change in No Transfer Assistance: None activities of daily living that may affect Patient Identification Verified: Yes risk of falls: Secondary Verification Process Completed: Yes Signs or symptoms of abuse/neglect since last visito No Patient Requires Transmission-Based No Hospitalized since last visit: No Precautions: Implantable device outside of the clinic excluding No Patient Has Alerts: Yes cellular tissue based products placed in the center Patient Alerts: **ALLERGIC TO since last visit: LIDOCAINE** Has Dressing in Place as Prescribed: Yes Pain Present Now: No Electronic Signature(s) Signed: 04/10/2021 3:51:10 PM By: Carlene Coria RN Entered By: Carlene Coria on 04/03/2021 14:55:23 Mervyn Skeeters ([id number]) -------------------------------------------------------------------------------- Clinic Level of Care Assessment Details Patient Name: Kimberly Pen D. Date of Service: 04/03/2021 2:45 PM Medical Record Number: [id number] Patient Account Number: [id number] Date of Birth/Sex: 02-May-1946 (75 y.o. F) Treating RN: Carlene Coria Primary Care Master Touchet: Viviana Simpler Other Clinician: Jeanine Luz Referring Isaack Preble: Viviana Simpler Treating  Quention Mcneill/Extender: Skipper Cliche in Treatment: 5 Clinic Level of Care Assessment Items TOOL 1 Quantity Score [] - Use when EandM and Procedure is performed on INITIAL visit 0 ASSESSMENTS - Nursing Assessment / Reassessment [] - General Physical Exam (combine w/ comprehensive assessment (listed just below) when performed on new 0 pt. evals) [] - 0 Comprehensive Assessment (HX, ROS, Risk Assessments, Wounds Hx, etc.) ASSESSMENTS - Wound and Skin Assessment / Reassessment [] - Dermatologic / Skin Assessment (not related to wound area) 0 ASSESSMENTS - Ostomy and/or Continence Assessment and Care [] - Incontinence Assessment and Management 0 [] - 0 Ostomy Care Assessment and Management (repouching, etc.) PROCESS - Coordination of Care [] - Simple Patient / Family Education for ongoing care 0 [] - 0 Complex (extensive) Patient / Family Education for ongoing care [] - 0 Staff obtains Programmer, systems, Records, Test Results / Process Orders [] - 0 Staff telephones HHA, Nursing Homes / Clarify orders / etc [] - 0 Routine Transfer to another Facility (non-emergent condition) [] - 0 Routine Hospital Admission (non-emergent condition) [] - 0 New Admissions / Biomedical engineer / Ordering NPWT, Apligraf, etc. [] - 0 Emergency Hospital Admission (emergent condition) PROCESS - Special Needs [] - Pediatric / Minor Patient Management 0 [] - 0 Isolation Patient Management [] - 0 Hearing / Language / Visual special needs [] - 0 Assessment of Community assistance (transportation, D/C planning, etc.) [] - 0 Additional assistance / Altered mentation [] - 0 Support Surface(s) Assessment (bed, cushion, seat, etc.) INTERVENTIONS - Miscellaneous [] - External ear exam 0 [] - 0 Patient Transfer (multiple staff / Civil Service fast streamer / Similar devices) [] - 0 Simple Staple / Suture removal (25 or less) [] - 0 Complex Staple / Suture removal (26 or more) [] - 0 Hypo/Hyperglycemic Management (do not  check if billed separately) [] - 0 Ankle / Brachial Index (ABI) - do not check if billed separately Has the patient been seen at the hospital  within the last three years: Yes Total Score: 0 Level Of Care: ____ Mervyn Skeeters ([id number]) Electronic Signature(s) Signed: 04/10/2021 3:51:10 PM By: Carlene Coria RN Entered By: Carlene Coria on 04/03/2021 16:33:17 Mervyn Skeeters ([id number]) -------------------------------------------------------------------------------- Encounter Discharge Information Details Patient Name: Kimberly Pen D. Date of Service: 04/03/2021 2:45 PM Medical Record Number: [id number] Patient Account Number: [id number] Date of Birth/Sex: 06/11/1946 (75 y.o. F) Treating RN: Carlene Coria Primary Care Provider: Viviana Simpler Other Clinician: Jeanine Luz Referring Provider: Viviana Simpler Treating Provider/Extender: Skipper Cliche in Treatment: 5 Encounter Discharge Information Items Discharge Condition: Stable Ambulatory Status: Wheelchair Discharge Destination: Home Transportation: Private Auto Accompanied By: son Schedule Follow-up Appointment: Yes Clinical Summary of Care: Patient Declined Electronic Signature(s) Signed: 04/10/2021 3:51:10 PM By: Carlene Coria RN Entered By: Carlene Coria on 04/03/2021 16:35:17 Mervyn Skeeters ([id number]) -------------------------------------------------------------------------------- Lower Extremity Assessment Details Patient Name: Kimberly Pen D. Date of Service: 04/03/2021 2:45 PM Medical Record Number: [id number] Patient Account Number: [id number] Date of Birth/Sex: [birth year] (75 y.o. F) Treating RN: Carlene Coria Primary Care Provider: Viviana Simpler Other Clinician: Jeanine Luz Referring Provider: Viviana Simpler Treating Provider/Extender: Skipper Cliche in Treatment: 5 Electronic Signature(s) Signed: 04/10/2021 3:51:10 PM By: Carlene Coria RN Entered By: Carlene Coria on 04/03/2021  16:32:34 Mervyn Skeeters ([id number]) -------------------------------------------------------------------------------- Multi Wound Chart Details Patient Name: Kimberly Pen D. Date of Service: 04/03/2021 2:45 PM Medical Record Number: [id number] Patient Account Number: [id number] Date of Birth/Sex: Jun 07, 1946 (75 y.o. F) Treating RN: Donnamarie Poag Primary Care Provider: Viviana Simpler Other Clinician: Jeanine Luz Referring Provider: Viviana Simpler Treating Provider/Extender: Skipper Cliche in Treatment: 5 Vital Signs Height(in): 63 Pulse(bpm): 45 Weight(lbs): 120 Blood Pressure(mmHg): 111/74 Body Mass Index(BMI): 21 Temperature(F): 98.7 Respiratory Rate(breaths/min): 18 Photos: [N/A:N/A] Wound Location: Sacrum N/A N/A Wounding Event: Pressure Injury N/A N/A Primary Etiology: Pressure Ulcer N/A N/A Comorbid History: History of pressure wounds, N/A N/A Osteoarthritis Date Acquired: 02/12/2021 N/A N/A Weeks of Treatment: 5 N/A N/A Wound Status: Open N/A N/A Measurements L x W x D (cm) 0.6x0.4x2 N/A N/A Area (cm) : 0.188 N/A N/A Volume (cm) : 0.377 N/A N/A % Reduction in Area: 92.00% N/A N/A % Reduction in Volume: 84.00% N/A N/A Classification: Category/Stage III N/A N/A Exudate Amount: Medium N/A N/A Exudate Type: Serosanguineous N/A N/A Exudate Color: red, brown N/A N/A Wound Margin: Flat and Intact N/A N/A Granulation Amount: Medium (34-66%) N/A N/A Granulation Quality: Pink, Pale N/A N/A Necrotic Amount: Small (1-33%) N/A N/A Exposed Structures: Fat Layer (Subcutaneous Tissue): N/A N/A Yes Fascia: No Tendon: No Muscle: No Joint: No Bone: No Epithelialization: None N/A N/A Treatment Notes Electronic Signature(s) Signed: 04/03/2021 4:31:23 PM By: Donnamarie Poag Entered By: Donnamarie Poag on 04/03/2021 15:22:03 Mervyn Skeeters ([id number]) -------------------------------------------------------------------------------- Cooter  Details Patient Name: Kimberly Pen D. Date of Service: 04/03/2021 2:45 PM Medical Record Number: [id number] Patient Account Number: [id number] Date of Birth/Sex: 1946-07-20 (75 y.o. F) Treating RN: Donnamarie Poag Primary Care Provider: Viviana Simpler Other Clinician: Jeanine Luz Referring Provider: Viviana Simpler Treating Provider/Extender: Skipper Cliche in Treatment: 5 Active Inactive Necrotic Tissue Nursing Diagnoses: Impaired tissue integrity related to necrotic/devitalized tissue Knowledge deficit related to management of necrotic/devitalized tissue Goals: Necrotic/devitalized tissue will be minimized in the wound bed Date Initiated: 02/27/2021 Target Resolution Date: 03/13/2021 Goal Status: Active Interventions: Assess patient pain level pre-, during and post procedure and prior to discharge Treatment Activities: Excisional debridement : 02/27/2021 Notes: Pressure Nursing Diagnoses: Potential for impaired tissue integrity related to pressure, friction,  moisture, and shear Goals: Patient will remain free from development of additional pressure ulcers Date Initiated: 02/27/2021 Target Resolution Date: 03/13/2021 Goal Status: Active Interventions: Assess: immobility, friction, shearing, incontinence upon admission and as needed Provide education on pressure ulcers Notes: Wound/Skin Impairment Nursing Diagnoses: Impaired tissue integrity Goals: Patient/caregiver will verbalize understanding of skin care regimen Date Initiated: 02/27/2021 Date Inactivated: 03/27/2021 Target Resolution Date: 03/13/2021 Goal Status: Met Ulcer/skin breakdown will have a volume reduction of 30% by week 4 Date Initiated: 02/27/2021 Target Resolution Date: 03/23/2021 Goal Status: Active Interventions: Assess ulceration(s) every visit Treatment Activities: Skin care regimen initiated : 02/27/2021 Topical wound management initiated : 02/27/2021 [name], [name]  ([id number]) Notes: Electronic Signature(s) Signed: 04/03/2021 4:31:23 PM By: Donnamarie Poag Entered By: Donnamarie Poag on 04/03/2021 15:21:39 Kimberly Pen D. ([id number]) -------------------------------------------------------------------------------- Negative Pressure Wound Therapy Application (NPWT) Details Patient Name: CATRENA, VARI. Date of Service: 04/03/2021 2:45 PM Medical Record Number: [id number] Patient Account Number: [id number] Date of Birth/Sex: 08/07/1946 (75 y.o. F) Treating RN: Donnamarie Poag Primary Care Provider: Viviana Simpler Other Clinician: Jeanine Luz Referring Provider: Viviana Simpler Treating Provider/Extender: Skipper Cliche in Treatment: 5 NPWT Application Performed for: Wound #1 Sacrum Performed By: Dolan Amen, RN Type: Other Coverage Size (sq cm): 0.24 Pressure Type: Constant Pressure Setting: 125 mmHG Drain Type: None Primary Contact: Other Other: Prisma Quantity of Sponges/Gauze Inserted: 1 Sponge/Dressing Type: Foam, Blue Date Initiated: 04/03/2021 Response to Treatment: tolerated well Post Procedure Diagnosis Same as Pre-procedure Electronic Signature(s) Signed: 04/03/2021 4:31:23 PM By: Donnamarie Poag Entered By: Donnamarie Poag on 04/03/2021 15:26:44 Kimberly Pen D. ([id number]) -------------------------------------------------------------------------------- Pain Assessment Details Patient Name: Kimberly Pen D. Date of Service: 04/03/2021 2:45 PM Medical Record Number: [id number] Patient Account Number: [id number] Date of Birth/Sex: 12-03-1946 (75 y.o. F) Treating RN: Carlene Coria Primary Care Provider: Viviana Simpler Other Clinician: Jeanine Luz Referring Provider: Viviana Simpler Treating Provider/Extender: Skipper Cliche in Treatment: 5 Active Problems Location of Pain Severity and Description of Pain Patient Has Paino No Site Locations Pain Management and Medication Current Pain Management: Electronic  Signature(s) Signed: 04/10/2021 3:51:10 PM By: Carlene Coria RN Entered By: Carlene Coria on 04/03/2021 14:55:59 Mervyn Skeeters ([id number]) -------------------------------------------------------------------------------- Patient/Caregiver Education Details Patient Name: Kimberly Pen D. Date of Service: 04/03/2021 2:45 PM Medical Record Number: [id number] Patient Account Number: [id number] Date of Birth/Gender: 18-Jan-1946 (75 y.o. F) Treating RN: Donnamarie Poag Primary Care Physician: Viviana Simpler Other Clinician: Jeanine Luz Referring Physician: Viviana Simpler Treating Physician/Extender: Skipper Cliche in Treatment: 5 Education Assessment Education Provided To: Patient and Caregiver Education Topics Provided Offloading: Pressure: Handouts: Other: continue to turn and reposition every 2-3 hours Methods: Explain/Verbal Responses: State content correctly Wound/Skin Impairment: Electronic Signature(s) Signed: 04/10/2021 3:51:10 PM By: Carlene Coria RN Previous Signature: 04/03/2021 4:31:23 PM Version By: Donnamarie Poag Entered By: Carlene Coria on 04/03/2021 16:33:43 Kimberly Pen D. ([id number]) -------------------------------------------------------------------------------- Wound Assessment Details Patient Name: Kimberly Pen D. Date of Service: 04/03/2021 2:45 PM Medical Record Number: [id number] Patient Account Number: [id number] Date of Birth/Sex: [birth year] (75 y.o. F) Treating RN: Donnamarie Poag Primary Care Provider: Viviana Simpler Other Clinician: Jeanine Luz Referring Provider: Viviana Simpler Treating Provider/Extender: Skipper Cliche in Treatment: 5 Wound Status Wound Number: 1 Primary Etiology: Pressure Ulcer Wound Location: Sacrum Wound Status: Open Wounding Event: Pressure Injury Comorbid History: History of pressure wounds, Osteoarthritis Date Acquired: 02/12/2021 Weeks Of Treatment: 5 Clustered Wound: No Photos Wound Measurements Length: (cm)  0.6 Width: (cm) 0.4 Depth: (cm) 1.3 Area: (cm) 0.188 Volume: (cm) 0.245 %  Reduction in Area: 92% % Reduction in Volume: 89.6% Epithelialization: None Tunneling: No Undermining: Yes Starting Position (o'clock): 1 Ending Position (o'clock): 3 Maximum Distance: (cm) 1.4 Wound Description Classification: Category/Stage III Wound Margin: Flat and Intact Exudate Amount: Medium Exudate Type: Serosanguineous Exudate Color: red, brown Foul Odor After Cleansing: No Slough/Fibrino No Wound Bed Granulation Amount: Medium (34-66%) Exposed Structure Granulation Quality: Pink, Pale Fascia Exposed: No Necrotic Amount: Small (1-33%) Fat Layer (Subcutaneous Tissue) Exposed: Yes Tendon Exposed: No Muscle Exposed: No Joint Exposed: No Bone Exposed: No Electronic Signature(s) Signed: 04/03/2021 4:31:23 PM By: Donnamarie Poag Entered By: Donnamarie Poag on 04/03/2021 15:24:16 Mervyn Skeeters ([id number]) -------------------------------------------------------------------------------- Vitals Details Patient Name: Kimberly Pen D. Date of Service: 04/03/2021 2:45 PM Medical Record Number: [id number] Patient Account Number: [id number] Date of Birth/Sex: 1946-09-13 (75 y.o. F) Treating RN: Carlene Coria Primary Care Provider: Viviana Simpler Other Clinician: Jeanine Luz Referring Provider: Viviana Simpler Treating Provider/Extender: Skipper Cliche in Treatment: 5 Vital Signs Time Taken: 14:55 Temperature (F): 98.7 Height (in): 63 Pulse (bpm): 81 Weight (lbs): 120 Respiratory Rate (breaths/min): 18 Body Mass Index (BMI): 21.3 Blood Pressure (mmHg): 111/74 Reference Range: 80 - 120 mg / dl Electronic Signature(s) Signed: 04/10/2021 3:51:10 PM By: Carlene Coria RN Entered By: Carlene Coria on 04/03/2021 14:55:52

## 2021-04-17 ENCOUNTER — Other Ambulatory Visit: Payer: Self-pay

## 2021-04-17 ENCOUNTER — Encounter: Payer: Medicare Other | Attending: Internal Medicine | Admitting: Internal Medicine

## 2021-04-17 DIAGNOSIS — M1990 Unspecified osteoarthritis, unspecified site: Secondary | ICD-10-CM | POA: Insufficient documentation

## 2021-04-17 DIAGNOSIS — G35 Multiple sclerosis: Secondary | ICD-10-CM | POA: Diagnosis not present

## 2021-04-17 DIAGNOSIS — L89221 Pressure ulcer of left hip, stage 1: Secondary | ICD-10-CM | POA: Insufficient documentation

## 2021-04-17 DIAGNOSIS — L89153 Pressure ulcer of sacral region, stage 3: Secondary | ICD-10-CM | POA: Diagnosis not present

## 2021-04-17 NOTE — Progress Notes (Signed)
IFEOLUWA, BARTZ ([id number]) Visit Report for 04/17/2021 HPI Details Patient Name: [name], [name]. Date of Service: 04/17/2021 2:15 PM Medical Record Number: [id number] Patient Account Number: [id number] Date of Birth/Sex: 09/07/1946 (75 y.o. F) Treating RN: Huel Coventry Primary Care Provider: Tillman Abide Other Clinician: Lolita Cram Referring Provider: Tillman Abide Treating Provider/Extender: Altamese [place] in Treatment: 7 History of Present Illness HPI Description: Admission 02/27/2021 This woman is a 75 year old woman with fairly advanced multiple sclerosis that was diagnosed in 1982 follows with neurology. In October of last year she had an ORIF of a tib-fib fracture and apparently a shoulder replacement. She went to [facility] for rehabilitation is just come home 2 weeks ago. Unbeknownst to the patient or her son they noticed a wound on her sacrum. Her son is able to show me pictures on the phone generally small wound slightly bigger than a dime however completely necrotic surface. They are not exactly sure what they are using on this wound. There is a note from primary care I think a telephone conversation that they sent in [place] on 02/15/2021 although both the patient and her son think it is Sylvant which is something I am really not familiar with. They said it was $1300 at [facility] they ended up paying $300 after insurance. Even for Santyl this would be expense Also the patient has come home a lot more physically challenged. 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 This will make it difficult to keep the pressure off this area. They have [facility] and they are getting a physical therapist out. Past medical history includes; multiple sclerosis ankle fractures, disc herniation, mitral valve prolapse, nephrolithiasis, osteoporosis, rib fractures. 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 We have been using silver collagen. As it turns out the ointment that you are using was Santyl. I told him not to shoot this out as we may need to go back to this. It sounds like they are religiously offloading this only up 1 hour in her chair for meals. Also careful offloading in the wheelchair. 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 We have been using silver collagen after she came in on Santyl. This is a deep wound and there is undermining at the base that is difficult to measure because of the small surface area. Still using silver collagen. We are going to explore a snap VAC and/or Oasis through insurance. 4/6; deep stage III or IV wound over the sacrum. It is really difficult to tell given the small surface area orifice of the. We have been using silver collagen the last 2 weeks. The patient is very disabled with marked hip and knee flexion contractures nevertheless they are being fairly 8 religious I think about offloading this correctly. I am not totally sure about her nutritional parameters. She is completing the antibiotics we initially gave her for Streptococcus intermedius which was Augmentin. I think that can be discontinued she had a 10-day course 4/13; the snapvac came off 2 days ago. Patient son was unable to replace this. I he put a dressing in there I am not sure which. The area looks as though it is contracted although there is still depth and undermining. He also shows [place] an area on her left greater trochanter 04/03/2021 upon evaluation today patient appears to be doing well currently in regard to her wound in the sacral region. She  has been tolerating the snap VAC without complication. I do not see anything right now that Santyl seems to be doing for her to be perfectly honest. I think that we may want to switch to adding a little bit of collagen underneath the Stafac I think that would be absolutely appropriate. 4/27;  the patient's wound is quite a bit smaller in terms of surface area than the last time I saw this. The orifice is too small to really do any form of examination of the wound bed. Nevertheless the depth of this is 0.6 cm which is quite a bit of an improvement since even last week down 50% and overall did. Her son tells me they are keeping the snap VAC on for the most part although if it comes off at night he is able to replace it 5/4; patient's wound is contracted she has a divot but under intense illumination nothing is open. Electronic Signature(s) Signed: 04/17/2021 4:35:29 PM By: Baltazar Najjarobson, Ladeidra Borys MD Entered By: Baltazar Najjarobson, Trajon Rosete on 04/17/2021 15:16:34 Arsenio LoaderBATEMAN, Kimberly D. ([id number]) -------------------------------------------------------------------------------- Physical Exam Details Patient Name: Kimberly LambBATEMAN, Kimberly D. Date of Service: 04/17/2021 2:15 PM Medical Record Number: [id number] Patient Account Number: [id number] Date of Birth/Sex: [birth year] (75 y.o. F) Treating RN: Huel CoventryWoody, Kim Primary Care Provider: Tillman AbideLetvak, Richard Other Clinician: Lolita CramBurnette, Kyara Referring Provider: Tillman AbideLetvak, Richard Treating Provider/Extender: Altamese CarolinaOBSON, Ashten Sarnowski G Weeks in Treatment: 7 Constitutional Sitting or standing Blood Pressure is within target range for patient.. Pulse regular and within target range for patient.Marland Kitchen. Respirations regular, non- labored and within target range.. Temperature is normal and within the target range for the patient.Marland Kitchen. appears in no distress. Notes Wound exam; the original wound area is a divot but under illumination there is nothing but full epithelialization. No evidence of surrounding infection Electronic Signature(s) Signed: 04/17/2021 4:35:29 PM By: Baltazar Najjarobson, Secundino Ellithorpe MD Entered By: Baltazar Najjarobson, Kennia Vanvorst on 04/17/2021 15:17:39 Arsenio LoaderBATEMAN, Kimberly D. ([id number]) -------------------------------------------------------------------------------- Physician Orders Details Patient Name: Kimberly LambBATEMAN, Kimberly  D. Date of Service: 04/17/2021 2:15 PM Medical Record Number: [id number] Patient Account Number: [id number] Date of Birth/Sex: [birth year] (75 y.o. F) Treating RN: Rogers BlockerSanchez, Kenia Primary Care Provider: Tillman AbideLetvak, Richard Other Clinician: Lolita CramBurnette, Kyara Referring Provider: Tillman AbideLetvak, Richard Treating Provider/Extender: Altamese CarolinaOBSON, Brissa Asante G Weeks in Treatment: 7 Verbal / Phone Orders: No Diagnosis [facility] Discharge from Wound Care Center Treatment Complete Follow-up Appointments o Other: - As needed Electronic Signature(s) Signed: 04/17/2021 4:35:29 PM By: Baltazar Najjarobson, Sheetal Lyall MD Signed: 04/17/2021 4:47:26 PM By: Phillis HaggisSanchez Pereyda, Dondra PraderKenia RN Entered By: Phillis HaggisSanchez Pereyda, Dondra PraderKenia on 04/17/2021 15:14:41 Arsenio LoaderBATEMAN, Harmoni D. ([id number]) -------------------------------------------------------------------------------- Problem List Details Patient Name: Kimberly LambBATEMAN, Kimberly D. Date of Service: 04/17/2021 2:15 PM Medical Record Number: [id number] Patient Account Number: [id number] Date of Birth/Sex: [birth year] (75 y.o. F) Treating RN: Huel CoventryWoody, Kim Primary Care Provider: Tillman AbideLetvak, Richard Other Clinician: Lolita CramBurnette, Kyara Referring Provider: Tillman AbideLetvak, Richard Treating Provider/Extender: Altamese CarolinaOBSON, Milayna Rotenberg G Weeks in Treatment: 7 Active Problems ICD-10 Encounter Code Description Active Date MDM Diagnosis L89.153 Pressure ulcer of sacral region, stage 3 02/27/2021 No Yes G35 Multiple sclerosis 02/27/2021 No Yes L89.221 Pressure ulcer of left hip, stage 1 03/27/2021 No Yes Inactive Problems Resolved Problems Electronic Signature(s) Signed: 04/17/2021 4:35:29 PM By: Baltazar Najjarobson, Shavell Nored MD Entered By: Baltazar Najjarobson, Solimar Maiden on 04/17/2021 15:15:25 Arsenio LoaderBATEMAN, Jeannelle D. ([id number]) -------------------------------------------------------------------------------- Progress Note Details Patient Name: Kimberly LambBATEMAN, Kimberly D. Date of Service: 04/17/2021 2:15 PM Medical Record Number: [id number] Patient Account Number:  [id number] Date of Birth/Sex: [birth year] ([age] y.o. F) Treating RN: Huel CoventryWoody, Kim Primary Care Provider: Tillman AbideLetvak, Richard Other Clinician: Rosezetta SchlatterBurnette,  Jola Schmidt Referring Provider: Tillman Abide Treating Provider/Extender: Altamese [place] in Treatment: 7 Subjective History of Present Illness (HPI) Admission 02/27/2021 This woman is a 75 year old woman with fairly advanced multiple sclerosis that was diagnosed in 1982 follows with neurology. In October of last year she had an ORIF of a tib-fib fracture and apparently a shoulder replacement. She went to [facility] for rehabilitation is just come home 2 weeks ago. Unbeknownst to the patient or her son they noticed a wound on her sacrum. Her son is able to show me pictures on the phone generally small wound slightly bigger than a dime however completely necrotic surface. They are not exactly sure what they are using on this wound. There is a note from primary care I think a telephone conversation that they sent in [place] on 02/15/2021 although both the patient and her son think it is Sylvant which is something I am really not familiar with. They said it was $1300 at [facility] they ended up paying $300 after insurance. Even for Santyl this would be expense Also the patient has come home a lot more physically challenged. 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 This will make it difficult to keep the pressure off this area. They have [facility] and they are getting a physical therapist out. Past medical history includes; multiple sclerosis ankle fractures, disc herniation, mitral valve prolapse, nephrolithiasis, osteoporosis, rib fractures. 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 We have been using silver collagen. As it turns out the ointment that you are using was Santyl. I told him not to shoot this out as we may need to go back  to this. It sounds like they are religiously offloading this only up 1 hour in her chair for meals. Also careful offloading in the wheelchair. 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 We have been using silver collagen after she came in on Santyl. This is a deep wound and there is undermining at the base that is difficult to measure because of the small surface area. Still using silver collagen. We are going to explore a snap VAC and/or Oasis through insurance. 4/6; deep stage III or IV wound over the sacrum. It is really difficult to tell given the small surface area orifice of the. We have been using silver collagen the last 2 weeks. The patient is very disabled with marked hip and knee flexion contractures nevertheless they are being fairly 8 religious I think about offloading this correctly. I am not totally sure about her nutritional parameters. She is completing the antibiotics we initially gave her for Streptococcus intermedius which was Augmentin. I think that can be discontinued she had a 10-day course 4/13; the snapvac came off 2 days ago. Patient son was unable to replace this. I he put a dressing in there I am not sure which. The area looks as though it is contracted although there is still depth and undermining. He also shows [place] an area on her left greater trochanter 04/03/2021 upon evaluation today patient appears to be doing well currently in regard to her wound in the sacral region. She has been tolerating the snap VAC without complication. I do not see anything right now that Santyl seems to be doing for her to be perfectly honest. I think that we may want to switch to adding a little bit of collagen underneath the Stafac I think that  would be absolutely appropriate. 4/27; the patient's wound is quite a bit smaller in terms of surface area than the last time I saw this. The orifice is too small to really do any form of examination of the wound bed. Nevertheless the depth of this is  0.6 cm which is quite a bit of an improvement since even last week down 50% and overall did. Her son tells me they are keeping the snap VAC on for the most part although if it comes off at night he is able to replace it 5/4; patient's wound is contracted she has a divot but under intense illumination nothing is open. Objective Constitutional Sitting or standing Blood Pressure is within target range for patient.. Pulse regular and within target range for patient.Marland Kitchen Respirations regular, non- labored and within target range.. Temperature is normal and within the target range for the patient.Marland Kitchen appears in no distress. Vitals Time Taken: 2:57 PM, Height: 63 in, Weight: 120 lbs, BMI: 21.3, Temperature: 98.4 F, Pulse: 77 bpm, Respiratory Rate: 16 breaths/min, Blood Pressure: 111/70 mmHg. General Notes: Wound exam; the original wound area is a divot but under illumination there is nothing but full epithelialization. No evidence of surrounding infection [name], Kimberly D. ([id number]) Integumentary (Hair, Skin) Wound #1 status is Healed - Epithelialized. Original cause of wound was Pressure Injury. The date acquired was: 02/12/2021. The wound has been in treatment 7 weeks. The wound is located on the Sacrum. The wound measures 0cm length x 0cm width x 0cm depth; 0cm^2 area and 0cm^3 volume. There is Fat Layer (Subcutaneous Tissue) exposed. There is no tunneling or undermining noted. There is a medium amount of serous drainage noted. The wound margin is flat and intact. There is large (67-100%) pink, pale granulation within the wound bed. There is no necrotic tissue within the wound bed. Assessment Active Problems ICD-10 Pressure ulcer of sacral region, stage 3 Multiple sclerosis Pressure ulcer of left hip, stage 1 Plan [facility]charge From [facility]: Discharge from Wound Care Center Treatment Complete Follow-up Appointments: Other: - As needed 1. The patient to be discharged from the clinic 2. We  went over pressure relief issues. She is very disabled with severe hip and knee flexion contractures. Electronic Signature(s) Signed: 04/17/2021 4:35:29 PM By: Baltazar Najjar MD Entered By: Baltazar Najjar on 04/17/2021 15:18:25 Arsenio Loader ([id number]) -------------------------------------------------------------------------------- SuperBill Details Patient Name: [name] D. Date of Service: 04/17/2021 Medical Record Number: [id number] Patient Account Number: [id number] Date of Birth/Sex: 07-Jan-1946 (75 y.o. F) Treating RN: Rogers Blocker Primary Care Provider: Tillman Abide Other Clinician: Lolita Cram Referring Provider: Tillman Abide Treating Provider/Extender: Altamese [place] in Treatment: 7 Diagnosis Coding ICD-10 Codes Code Description [phone] Pressure ulcer of sacral region, stage 3 G35 Multiple sclerosis L89.221 Pressure ulcer of left hip, stage 1 Facility Procedures CPT4 Code: [id number] Description: [phone] - WOUND CARE VISIT-LEV 2 EST PT Modifier: Quantity: 1 Physician Procedures CPT4 Code: [id number] Description: 99213 - WC PHYS LEVEL 3 - EST PT Modifier: Quantity: 1 CPT4 Code: Description: ICD-10 Diagnosis Description L89.153 Pressure ulcer of sacral region, stage 3 G35 Multiple sclerosis Modifier: Quantity: Electronic Signature(s) Signed: 04/17/2021 4:35:29 PM By: Baltazar Najjar MD Entered By: Baltazar Najjar on 04/17/2021 15:18:42

## 2021-04-17 NOTE — Progress Notes (Signed)
[name], [name] ([id number]) Visit Report for 04/17/2021 Arrival Information Details Patient Name: [name], [name]. Date of Service: 04/17/2021 2:15 PM Medical Record Number: [id number] Patient Account Number: [id number] Date of Birth/Sex: 1946-09-12 (75 y.o. F) Treating RN: Yevonne Pax Primary Care Rasul Decola: Tillman Abide Other Clinician: Lolita Cram Referring Achsah Mcquade: Tillman Abide Treating Kataryna Mcquilkin/Extender: Altamese [place] in Treatment: 7 Visit Information History Since Last Visit All ordered tests and consults were completed: No Patient Arrived: Wheel Chair Added or deleted any medications: No Arrival Time: 14:56 Any new allergies or adverse reactions: No Accompanied By: son Had a fall or experienced change in No Transfer Assistance: None activities of daily living that may affect Patient Identification Verified: Yes risk of falls: Secondary Verification Process Completed: Yes Signs or symptoms of abuse/neglect since last visito No Patient Requires Transmission-Based No Hospitalized since last visit: No Precautions: Implantable device outside of the clinic excluding No Patient Has Alerts: Yes cellular tissue based products placed in the center Patient Alerts: **ALLERGIC TO since last visit: LIDOCAINE** Has Dressing in Place as Prescribed: Yes Pain Present Now: No Electronic Signature(s) Signed: 04/17/2021 4:46:16 PM By: Yevonne Pax RN Entered By: Yevonne Pax on 04/17/2021 14:56:57 Arsenio Loader ([id number]) -------------------------------------------------------------------------------- Clinic Level of Care Assessment Details Patient Name: Kimberly Lamb D. Date of Service: 04/17/2021 2:15 PM Medical Record Number: [id number] Patient Account Number: [id number] Date of Birth/Sex: 1945/12/28 (75 y.o. F) Treating RN: Rogers Blocker Primary Care Jimmie Dattilio: Tillman Abide Other Clinician: Lolita Cram Referring Shakeila Pfarr: Tillman Abide Treating  Climmie Cronce/Extender: Altamese [place] in Treatment: 7 Clinic Level of Care Assessment Items TOOL 4 Quantity Score []  - Use when only an EandM is performed on FOLLOW-UP visit 0 ASSESSMENTS - Nursing Assessment / Reassessment X - Reassessment of Co-morbidities (includes updates in patient status) 1 10 X- 1 5 Reassessment of Adherence to Treatment Plan ASSESSMENTS - Wound and Skin Assessment / Reassessment X - Simple Wound Assessment / Reassessment - one wound 1 5 []  - 0 Complex Wound Assessment / Reassessment - multiple wounds []  - 0 Dermatologic / Skin Assessment (not related to wound area) ASSESSMENTS - Focused Assessment []  - Circumferential Edema Measurements - multi extremities 0 []  - 0 Nutritional Assessment / Counseling / Intervention []  - 0 Lower Extremity Assessment (monofilament, tuning fork, pulses) []  - 0 Peripheral Arterial Disease Assessment (using hand held doppler) ASSESSMENTS - Ostomy and/or Continence Assessment and Care []  - Incontinence Assessment and Management 0 []  - 0 Ostomy Care Assessment and Management (repouching, etc.) PROCESS - Coordination of Care X - Simple Patient / Family Education for ongoing care 1 15 []  - 0 Complex (extensive) Patient / Family Education for ongoing care []  - 0 Staff obtains , Records, Test Results / Process Orders []  - 0 Staff telephones HHA, Nursing Homes / Clarify orders / etc []  - 0 Routine Transfer to another Facility (non-emergent condition) []  - 0 Routine Hospital Admission (non-emergent condition) []  - 0 New Admissions / / Ordering NPWT, Apligraf, etc. []  - 0 Emergency Hospital Admission (emergent condition) X- 1 10 Simple Discharge Coordination []  - 0 Complex (extensive) Discharge Coordination PROCESS - Special Needs []  - Pediatric / Minor Patient Management 0 []  - 0 Isolation Patient Management []  - 0 Hearing / Language / Visual special needs []  - 0 Assessment of  Community assistance (transportation, D/C planning, etc.) []  - 0 Additional assistance / Altered mentation []  - 0 Support Surface(s) Assessment (bed, cushion, seat, etc.) INTERVENTIONS - Wound Cleansing / Measurement  HIDAYA, TAK ([id number]) []  - 0 Simple Wound Cleansing - one wound []  - 0 Complex Wound Cleansing - multiple wounds []  - 0 Wound Imaging (photographs - any number of wounds) []  - 0 Wound Tracing (instead of photographs) []  - 0 Simple Wound Measurement - one wound []  - 0 Complex Wound Measurement - multiple wounds INTERVENTIONS - Wound Dressings []  - Small Wound Dressing one or multiple wounds 0 []  - 0 Medium Wound Dressing one or multiple wounds []  - 0 Large Wound Dressing one or multiple wounds []  - 0 Application of Medications - topical []  - 0 Application of Medications - injection INTERVENTIONS - Miscellaneous []  - External ear exam 0 []  - 0 Specimen Collection (cultures, biopsies, blood, body fluids, etc.) []  - 0 Specimen(s) / Culture(s) sent or taken to Lab for analysis []  - 0 Patient Transfer (multiple staff / Nurse, adult / Similar devices) []  - 0 Simple Staple / Suture removal (25 or less) []  - 0 Complex Staple / Suture removal (26 or more) []  - 0 Hypo / Hyperglycemic Management (close monitor of Blood Glucose) []  - 0 Ankle / Brachial Index (ABI) - do not check if billed separately X- 1 5 Vital Signs Has the patient been seen at the hospital within the last three years: Yes Total Score: 50 Level Of Care: New/Established - Level 2 Electronic Signature(s) Signed: 04/17/2021 4:47:26 PM By: Phillis Haggis, Dondra Prader RN Entered By: Phillis Haggis, Dondra Prader on 04/17/2021 15:10:19 Arsenio Loader ([id number]) -------------------------------------------------------------------------------- Encounter Discharge Information Details Patient Name: Kimberly Lamb D. Date of Service: 04/17/2021 2:15 PM Medical Record Number: [id number] Patient Account  Number: [id number] Date of Birth/Sex: [birth year] (75 y.o. F) Treating RN: Rogers Blocker Primary Care Sitara Cashwell: Tillman Abide Other Clinician: Lolita Cram Referring Larisa Lanius: Tillman Abide Treating Jayda White/Extender: Altamese [place] in Treatment: 7 Encounter Discharge Information Items Discharge Condition: Stable Ambulatory Status: Wheelchair Discharge Destination: Home Transportation: Private Auto Accompanied By: self Schedule Follow-up Appointment: Yes Clinical Summary of Care: Electronic Signature(s) Signed: 04/17/2021 4:47:26 PM By: Phillis Haggis, Dondra Prader RN Entered By: Phillis Haggis, Dondra Prader on 04/17/2021 15:15:10 Arsenio Loader ([id number]) -------------------------------------------------------------------------------- Lower Extremity Assessment Details Patient Name: Kimberly Lamb D. Date of Service: 04/17/2021 2:15 PM Medical Record Number: [id number] Patient Account Number: [id number] Date of Birth/Sex: 04-Oct-1946 (75 y.o. F) Treating RN: Yevonne Pax Primary Care Chrisy Hillebrand: Tillman Abide Other Clinician: Lolita Cram Referring Kentley Blyden: Tillman Abide Treating Jayshun Galentine/Extender: Altamese [place] in Treatment: 7 Electronic Signature(s) Signed: 04/17/2021 4:46:16 PM By: Yevonne Pax RN Entered By: Yevonne Pax on 04/17/2021 15:02:36 Arsenio Loader ([id number]) -------------------------------------------------------------------------------- Multi Wound Chart Details Patient Name: Kimberly Lamb D. Date of Service: 04/17/2021 2:15 PM Medical Record Number: [id number] Patient Account Number: [id number] Date of Birth/Sex: 1946-08-26 (75 y.o. F) Treating RN: Rogers Blocker Primary Care Avondre Richens: Tillman Abide Other Clinician: Lolita Cram Referring Porshea Janowski: Tillman Abide Treating Naylea Wigington/Extender: Altamese [place] in Treatment: 7 Vital Signs Height(in): 63 Pulse(bpm): 77 Weight(lbs): 120 Blood Pressure(mmHg): 111/70 Body  Mass Index(BMI): 21 Temperature(F): 98.4 Respiratory Rate(breaths/min): 16 Photos: [N/A:N/A] Wound Location: Sacrum N/A N/A Wounding Event: Pressure Injury N/A N/A Primary Etiology: Pressure Ulcer N/A N/A Comorbid History: History of pressure wounds, N/A N/A Osteoarthritis Date Acquired: 02/12/2021 N/A N/A Weeks of Treatment: 7 N/A N/A Wound Status: Healed - Epithelialized N/A N/A Measurements L x W x D (cm) 0x0x0 N/A N/A Area (cm) : 0 N/A N/A Volume (cm) : 0 N/A N/A % Reduction in Area: 100.00% N/A N/A % Reduction in Volume: 100.00%  N/A N/A Classification: Category/Stage III N/A N/A Exudate Amount: Medium N/A N/A Exudate Type: Serous N/A N/A Exudate Color: amber N/A N/A Wound Margin: Flat and Intact N/A N/A Granulation Amount: Large (67-100%) N/A N/A Granulation Quality: Pink, Pale N/A N/A Necrotic Amount: None Present (0%) N/A N/A Exposed Structures: Fat Layer (Subcutaneous Tissue): N/A N/A Yes Fascia: No Tendon: No Muscle: No Joint: No Bone: No Epithelialization: None N/A N/A Treatment Notes Electronic Signature(s) Signed: 04/17/2021 4:35:29 PM By: Baltazar Najjar MD Entered By: Baltazar Najjar on 04/17/2021 15:16:04 Arsenio Loader ([id number]) -------------------------------------------------------------------------------- Multi-Disciplinary Care Plan Details Patient Name: Kimberly Lamb D. Date of Service: 04/17/2021 2:15 PM Medical Record Number: [id number] Patient Account Number: [id number] Date of Birth/Sex: July 28, 1946 (75 y.o. F) Treating RN: Rogers Blocker Primary Care Greta Yung: Tillman Abide Other Clinician: Lolita Cram Referring Sevon Rotert: Tillman Abide Treating Alynn Ellithorpe/Extender: Altamese [place] in Treatment: 7 Active Inactive Electronic Signature(s) Signed: 04/17/2021 4:47:26 PM By: Phillis Haggis, Dondra Prader RN Entered By: Phillis Haggis, Dondra Prader on 04/17/2021 15:13:52 Arsenio Loader  ([id number]) -------------------------------------------------------------------------------- Pain Assessment Details Patient Name: Kimberly Lamb D. Date of Service: 04/17/2021 2:15 PM Medical Record Number: [id number] Patient Account Number: [id number] Date of Birth/Sex: Nov 18, 1946 (75 y.o. F) Treating RN: Yevonne Pax Primary Care Valerya Maxton: Tillman Abide Other Clinician: Lolita Cram Referring Eladio Dentremont: Tillman Abide Treating Jeanetta Alonzo/Extender: Altamese [place] in Treatment: 7 Active Problems Location of Pain Severity and Description of Pain Patient Has Paino No Site Locations Pain Management and Medication Current Pain Management: Medication: No Cold Application: No Rest: No Massage: No Activity: No T.E.N.S.: No Heat Application: No Leg drop or elevation: No Is the Current Pain Management Adequate: Inadequate How does your wound impact your activities of daily livingo Sleep: No Bathing: No Appetite: No Relationship With Others: No Bladder Continence: No Emotions: No Bowel Continence: No Work: No Toileting: No Drive: No Dressing: No Hobbies: No Electronic Signature(s) Signed: 04/17/2021 4:46:16 PM By: Yevonne Pax RN Entered By: Yevonne Pax on 04/17/2021 14:58:24 Arsenio Loader ([id number]) -------------------------------------------------------------------------------- Patient/Caregiver Education Details Patient Name: Kimberly Lamb D. Date of Service: 04/17/2021 2:15 PM Medical Record Number: [id number] Patient Account Number: [id number] Date of Birth/Gender: Jan 13, 1946 (75 y.o. F) Treating RN: Rogers Blocker Primary Care Physician: Tillman Abide Other Clinician: Lolita Cram Referring Physician: Tillman Abide Treating Physician/Extender: Altamese [place] in Treatment: 7 Education Assessment Education Provided To: Patient and Caregiver Education Topics Provided Pressure: Handouts: Preventing Pressure Ulcers Methods:  Demonstration, Explain/Verbal Responses: State content correctly Electronic Signature(s) Signed: 04/17/2021 4:47:26 PM By: Phillis Haggis, Dondra Prader RN Entered By: Phillis Haggis, Dondra Prader on 04/17/2021 15:12:54 Arsenio Loader ([id number]) -------------------------------------------------------------------------------- Wound Assessment Details Patient Name: Kimberly Lamb D. Date of Service: 04/17/2021 2:15 PM Medical Record Number: [id number] Patient Account Number: [id number] Date of Birth/Sex: April 03, 1946 (74 y.o. F) Treating RN: Rogers Blocker Primary Care Modupe Shampine: Tillman Abide Other Clinician: Lolita Cram Referring Gedalya Jim: Tillman Abide Treating Lovelee Forner/Extender: Altamese  in Treatment: 7 Wound Status Wound Number: 1 Primary Etiology: Pressure Ulcer Wound Location: Sacrum Wound Status: Healed - Epithelialized Wounding Event: Pressure Injury Comorbid History: History of pressure wounds, Osteoarthritis Date Acquired: 02/12/2021 Weeks Of Treatment: 7 Clustered Wound: No Photos Wound Measurements Length: (cm) 0 Width: (cm) 0 Depth: (cm) 0 Area: (cm) 0 Volume: (cm) 0 % Reduction in Area: 100% % Reduction in Volume: 100% Epithelialization: None Tunneling: No Undermining: No Wound Description Classification: Category/Stage III Wound Margin: Flat and Intact Exudate Amount: Medium Exudate Type: Serous Exudate Color: amber Foul Odor After Cleansing: No Slough/Fibrino No  Wound Bed Granulation Amount: Large (67-100%) Exposed Structure Granulation Quality: Pink, Pale Fascia Exposed: No Necrotic Amount: None Present (0%) Fat Layer (Subcutaneous Tissue) Exposed: Yes Tendon Exposed: No Muscle Exposed: No Joint Exposed: No Bone Exposed: No Electronic Signature(s) Signed: 04/17/2021 4:47:26 PM By: Phillis Haggis, Dondra Prader RN Entered By: Phillis Haggis, Dondra Prader on 04/17/2021 15:13:43 Kimberly Lamb D.  ([id number]) -------------------------------------------------------------------------------- Vitals Details Patient Name: Kimberly Lamb D. Date of Service: 04/17/2021 2:15 PM Medical Record Number: [id number] Patient Account Number: [id number] Date of Birth/Sex: [birth year] (75 y.o. F) Treating RN: Yevonne Pax Primary Care Kawika Bischoff: Tillman Abide Other Clinician: Lolita Cram Referring Graceanne Guin: Tillman Abide Treating Deysha Cartier/Extender: Altamese [place] in Treatment: 7 Vital Signs Time Taken: 14:57 Temperature (F): 98.4 Height (in): 63 Pulse (bpm): 77 Weight (lbs): 120 Respiratory Rate (breaths/min): 16 Body Mass Index (BMI): 21.3 Blood Pressure (mmHg): 111/70 Reference Range: 80 - 120 mg / dl Electronic Signature(s) Signed: 04/17/2021 4:46:16 PM By: Yevonne Pax RN Entered By: Yevonne Pax on 04/17/2021 14:58:02

## 2021-04-24 ENCOUNTER — Encounter: Payer: Medicare Other | Admitting: Internal Medicine

## 2021-04-30 ENCOUNTER — Ambulatory Visit: Payer: Medicare Other | Admitting: Neurology

## 2021-05-09 ENCOUNTER — Telehealth: Payer: Self-pay | Admitting: Internal Medicine

## 2021-05-09 DIAGNOSIS — G35 Multiple sclerosis: Secondary | ICD-10-CM

## 2021-05-09 DIAGNOSIS — L89153 Pressure ulcer of sacral region, stage 3: Secondary | ICD-10-CM

## 2021-05-09 DIAGNOSIS — G8220 Paraplegia, unspecified: Secondary | ICD-10-CM

## 2021-05-09 NOTE — Telephone Encounter (Signed)
Referral placed.

## 2021-05-09 NOTE — Telephone Encounter (Signed)
[name] called in wanted to know about getting a referral to [facility] its for her legs and arms. And she has been laying for 7 months and she needs help getting up.    Please advise

## 2021-05-17 ENCOUNTER — Telehealth: Payer: Self-pay

## 2021-05-17 NOTE — Telephone Encounter (Signed)
Received call from Agustin Cree they are not able to start home care today. Patient declined start over the weekend. They have set start date to 05/20/2021.

## 2021-05-17 NOTE — Telephone Encounter (Signed)
That is fine 

## 2021-05-21 ENCOUNTER — Telehealth: Payer: Self-pay

## 2021-05-21 NOTE — Telephone Encounter (Signed)
PT 1 x week for 1 week, 2 x week for 8 weeks. I gave him verbal orders. They will fax the orders to be signed for Dr Alphonsus Sias to sign when he returns.

## 2021-06-18 DIAGNOSIS — M19012 Primary osteoarthritis, left shoulder: Secondary | ICD-10-CM

## 2021-06-18 DIAGNOSIS — G8220 Paraplegia, unspecified: Secondary | ICD-10-CM | POA: Diagnosis not present

## 2021-06-18 DIAGNOSIS — H539 Unspecified visual disturbance: Secondary | ICD-10-CM

## 2021-06-18 DIAGNOSIS — G35 Multiple sclerosis: Secondary | ICD-10-CM | POA: Diagnosis not present

## 2021-06-18 DIAGNOSIS — E785 Hyperlipidemia, unspecified: Secondary | ICD-10-CM

## 2021-06-18 DIAGNOSIS — M80061D Age-related osteoporosis with current pathological fracture, right lower leg, subsequent encounter for fracture with routine healing: Secondary | ICD-10-CM | POA: Diagnosis not present

## 2021-06-18 DIAGNOSIS — R42 Dizziness and giddiness: Secondary | ICD-10-CM

## 2021-06-18 DIAGNOSIS — Z87440 Personal history of urinary (tract) infections: Secondary | ICD-10-CM

## 2021-06-18 DIAGNOSIS — Z96611 Presence of right artificial shoulder joint: Secondary | ICD-10-CM

## 2021-06-18 DIAGNOSIS — H8113 Benign paroxysmal vertigo, bilateral: Secondary | ICD-10-CM

## 2021-06-18 DIAGNOSIS — I058 Other rheumatic mitral valve diseases: Secondary | ICD-10-CM

## 2021-06-18 DIAGNOSIS — I69398 Other sequelae of cerebral infarction: Secondary | ICD-10-CM

## 2021-06-18 DIAGNOSIS — G801 Spastic diplegic cerebral palsy: Secondary | ICD-10-CM | POA: Diagnosis not present

## 2021-06-18 DIAGNOSIS — M4802 Spinal stenosis, cervical region: Secondary | ICD-10-CM

## 2021-07-02 ENCOUNTER — Other Ambulatory Visit: Payer: Self-pay

## 2021-07-02 MED ORDER — METHENAMINE HIPPURATE 1 G PO TABS: 1.0000 g | ORAL_TABLET | ORAL | 11 refills | Status: DC

## 2021-07-02 NOTE — Telephone Encounter (Signed)
Patient called stating this medication was prescribed by her urologist Dr Elon Spanner originally but she has not been seen there in a while and would need an appointment and she has difficulty getting there. She was wondering if Dr Alphonsus Sias could refill this medication instead for her.  She is almost out of it.

## 2021-07-04 ENCOUNTER — Ambulatory Visit: Payer: Medicare Other | Admitting: Neurology

## 2021-07-08 NOTE — Telephone Encounter (Signed)
[name] called in and wanted to know about getting a medication refill through Dr. Alphonsus Sias due to it so hard to get with the urologist. The name of the medication is methenamine (HIPREX) 1 g tablet.  Looked at the chart and saw that it was sent on 7/19

## 2021-09-02 ENCOUNTER — Encounter: Payer: Self-pay | Admitting: Internal Medicine

## 2021-09-02 ENCOUNTER — Telehealth (INDEPENDENT_AMBULATORY_CARE_PROVIDER_SITE_OTHER): Payer: Medicare Other | Admitting: Internal Medicine

## 2021-09-02 ENCOUNTER — Other Ambulatory Visit: Payer: Self-pay

## 2021-09-02 DIAGNOSIS — N390 Urinary tract infection, site not specified: Secondary | ICD-10-CM | POA: Diagnosis not present

## 2021-09-02 DIAGNOSIS — B373 Candidiasis of vulva and vagina: Secondary | ICD-10-CM | POA: Diagnosis not present

## 2021-09-02 DIAGNOSIS — G35D Multiple sclerosis, unspecified: Secondary | ICD-10-CM

## 2021-09-02 DIAGNOSIS — B3731 Acute candidiasis of vulva and vagina: Secondary | ICD-10-CM | POA: Insufficient documentation

## 2021-09-02 DIAGNOSIS — G8220 Paraplegia, unspecified: Secondary | ICD-10-CM

## 2021-09-02 DIAGNOSIS — G35 Multiple sclerosis: Secondary | ICD-10-CM | POA: Diagnosis not present

## 2021-09-02 MED ORDER — FLUCONAZOLE 150 MG PO TABS: 150.0000 mg | ORAL_TABLET | ORAL | 1 refills | Status: DC

## 2021-09-02 NOTE — Assessment & Plan Note (Signed)
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

## 2021-09-02 NOTE — Assessment & Plan Note (Signed)
Quiet on the methanemine

## 2021-09-02 NOTE — Assessment & Plan Note (Signed)
Having a yeast infection now Will send Rx for fluconazole for weekly use prn

## 2021-09-02 NOTE — Progress Notes (Signed)
Subjective:    Patient ID: [name], female    DOB: [birth year], 75 y.o.   MRN: [id number]  HPI Video virtual visit for power wheelchair evaluation Identification done Reviewed limitations and billing and she gave consent Participants---patient and her son (who helps with the visit) at her home and I am in my office  Sacral wound has healed Is able to get up again  Has been using a power wheelchair for some years due to paraplegia She is unable to get out of bed herself Has aide from 8-3PM and 4-6PM---they use hoyer lift to transfer Son is present at night usually Is by herself for a variable amount of time daily  Uses the power chair to get around house Needs it to get into bathroom, to go to the kitchen etc She does use microwave and fixes some food---needs to use the powerchair for that  Has multiple sclerosis with permanent paraplegia Unable to walk due to this Cannot use regular wheelchair due to a lack of strength in her arms  She has the mental and physical ability to use a power wheelchair--as she has needed one for some time. She has used one in her home already  Current Outpatient Medications on File Prior to Visit  Medication Sig Dispense Refill   methenamine (HIPREX) 1 g tablet Take 1 tablet (1 g total) by mouth every other day. 30 tablet 11   No current facility-administered medications on file prior to visit.    Allergies  Allergen Reactions   Baclofen Diarrhea and Nausea And Vomiting   Lidocaine     rash   Procaine Hcl     REACTION: UNSPECIFIED    Past Medical History:  Diagnosis Date   Ankle fracture, left 07/2006   Ankle fracture, right 10/30/06   [facility]   Compression fracture 2011   Dr. Dion Saucier    Disc herniation    L5, S1   MS (multiple sclerosis) (HCC)    evaluated by Dr. Sandria Manly   MVP (mitral valve prolapse) 1979   ECHO. Trace MR, trace TR, trace PR 07/17/03   Nephrolithiasis    Osteoporosis    Rib fractures 1998, 6295,2841   2   Toe  fracture 1998   5    Past Surgical History:  Procedure Laterality Date   LUMBAR DISC SURGERY  04/2007   L5, S1- [facility]   OPEN REDUCTION INTERNAL FIXATION (ORIF) TIBIA/FIBULA FRACTURE Left 10/13/2020   Procedure: OPEN REDUCTION INTERNAL FIXATION (ORIF) TIBIA/FIBULA FRACTURE;  Surgeon: Roby Lofts, MD;  Location: [facility] OR;  Service: Orthopedics;  Laterality: Left;   REVERSE SHOULDER ARTHROPLASTY Right 10/15/2020   Procedure: REVERSE SHOULDER ARTHROPLASTY;  Surgeon: Bjorn Pippin, MD;  Location: [facility] OR;  Service: Orthopedics;  Laterality: Right;   SEPTOPLASTY  1983   deviated septum   TONSILLECTOMY AND ADENOIDECTOMY  1960   TOTAL ABDOMINAL HYSTERECTOMY W/ BILATERAL SALPINGOOPHORECTOMY  1982   for endometriosis    Family History  Problem Relation Age of Onset   Stroke Mother    Neuropathy Brother    Stroke Father    Hypertension Neg Hx    Diabetes Neg Hx    Depression Neg Hx    Alcohol abuse Neg Hx    Drug abuse Neg Hx     Social History   Socioeconomic History   Marital status: Widowed    Spouse name: Not on file   Number of children: 2   Years of education: HS   Highest education  level: Not on file  Occupational History   Occupation: Secretarial work---disabled  Tobacco Use   Smoking status: Never   Smokeless tobacco: Never  Substance and Sexual Activity   Alcohol use: No   Drug use: No   Sexual activity: Not on file  Other Topics Concern   Not on file  Social History Narrative   Widowed 46.  Lives alone.    Played piano.       No living will   Son [name] should make decisions for her prn   Would accept resuscitation attempts   No tube feeds if cognitively unaware      Caffeine IOX:BDZHGD decaf   Right handed    Social Determinants of Health   Financial Resource Strain: Not on file  Food Insecurity: Not on file  Transportation Needs: Not on file  Physical Activity: Not on file  Stress: Not on file  Social Connections: Not on file  Intimate Partner  Violence: Not on file   Review of Systems Appetite is good Sleep is variable---not always so good --but this is nothing new     Objective:   Physical Exam Constitutional:      Appearance: Normal appearance.     Comments: Sitting in powerchair----in driveway (she can't get out of the house without the power chair either)  Neurological:     Mental Status: She is alert.  Psychiatric:        Mood and Affect: Mood normal.        Behavior: Behavior normal.           Assessment & Plan:

## 2021-09-02 NOTE — Assessment & Plan Note (Signed)
No new symptoms Permanent paraplegia from this No active Rx now

## 2021-10-18 IMAGING — RF DG C-ARM 1-60 MIN
1 series · 5 of 5 positions shown · non-contrast
Comparison: July 19, 2006

CLINICAL DATA: Open reduction internal fixation of left tibial
fracture.

EXAM:
DG C-ARM 1-60 MIN; LEFT TIBIA AND FIBULA - 2 VIEW
FLUOROSCOPY TIME:  Fluoroscopy Time:  1 minutes and 5 seconds
Number of Acquired Spot Images: 0

[Series 1: run · 5 of 5 slices shown]
[im 1/5]
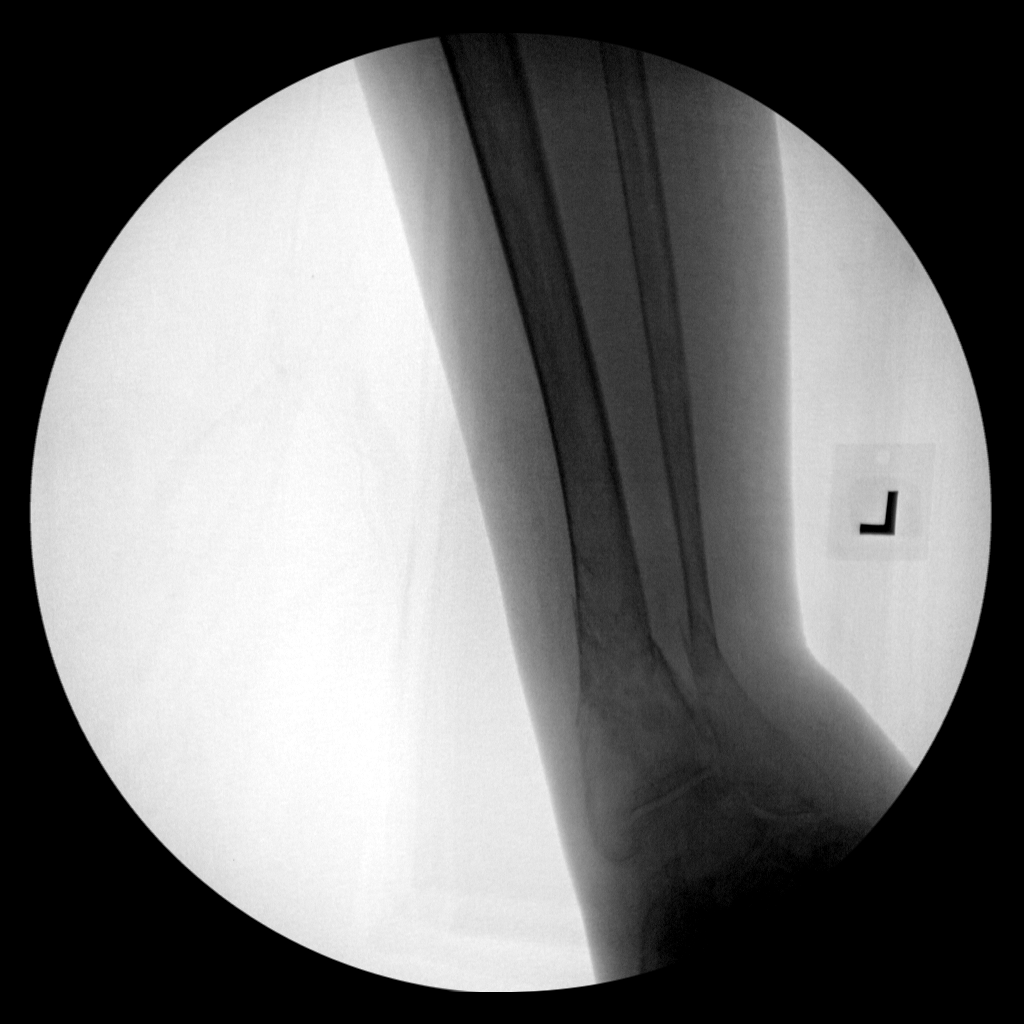
[im 2/5]
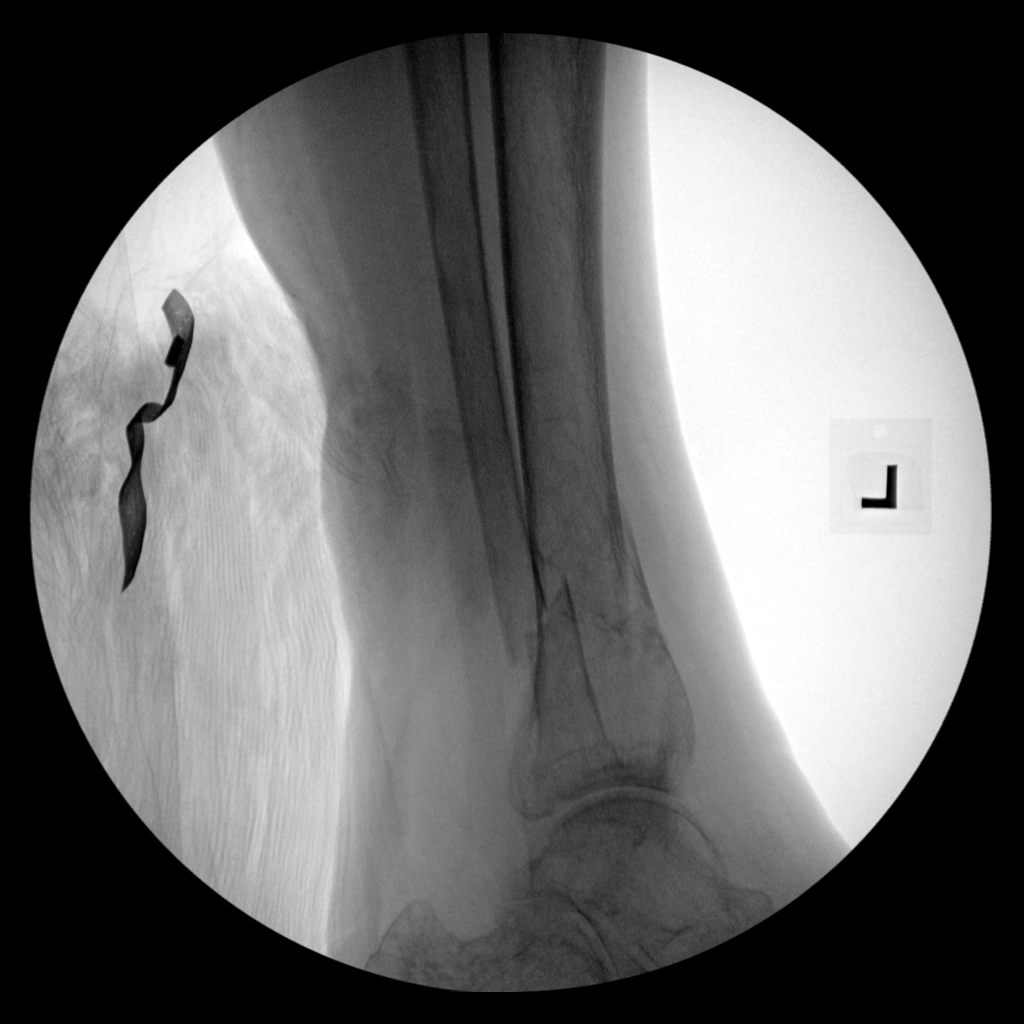
[im 3/5]
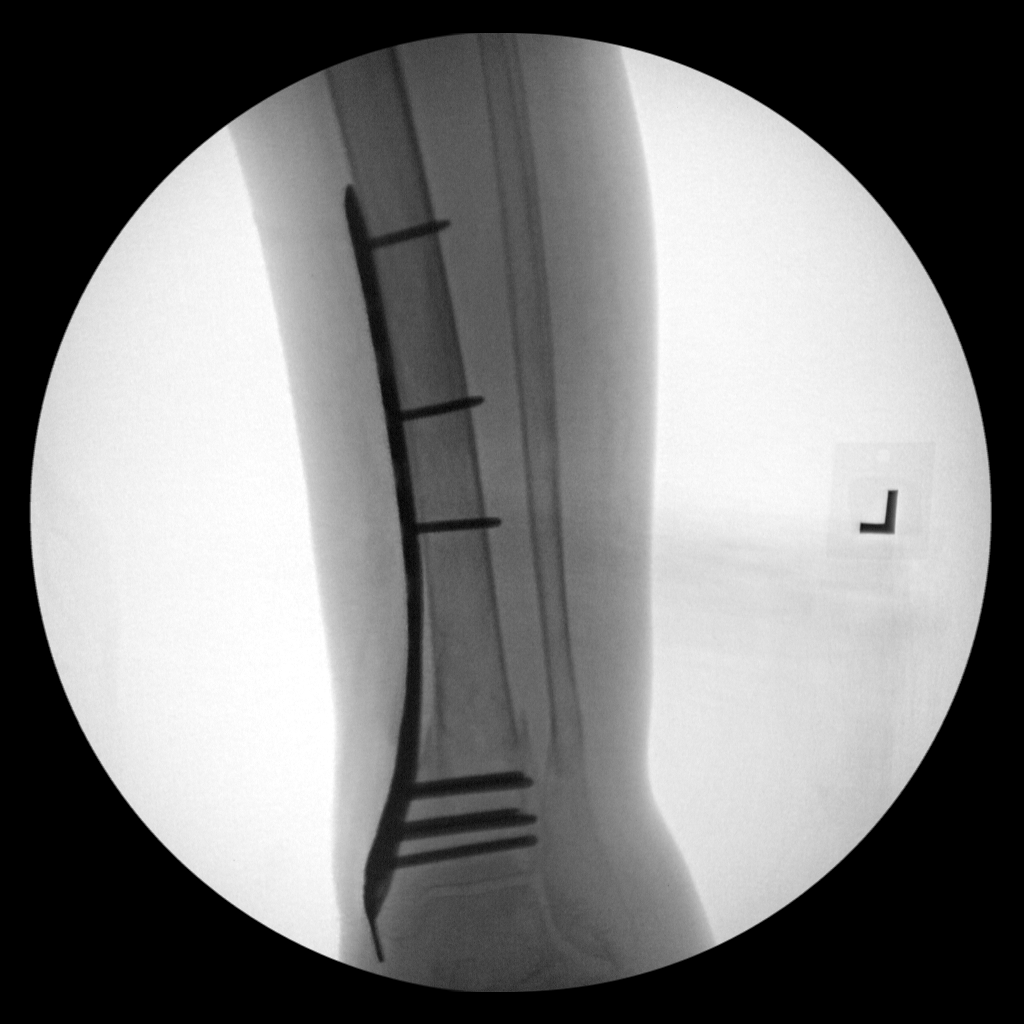
[im 4/5]
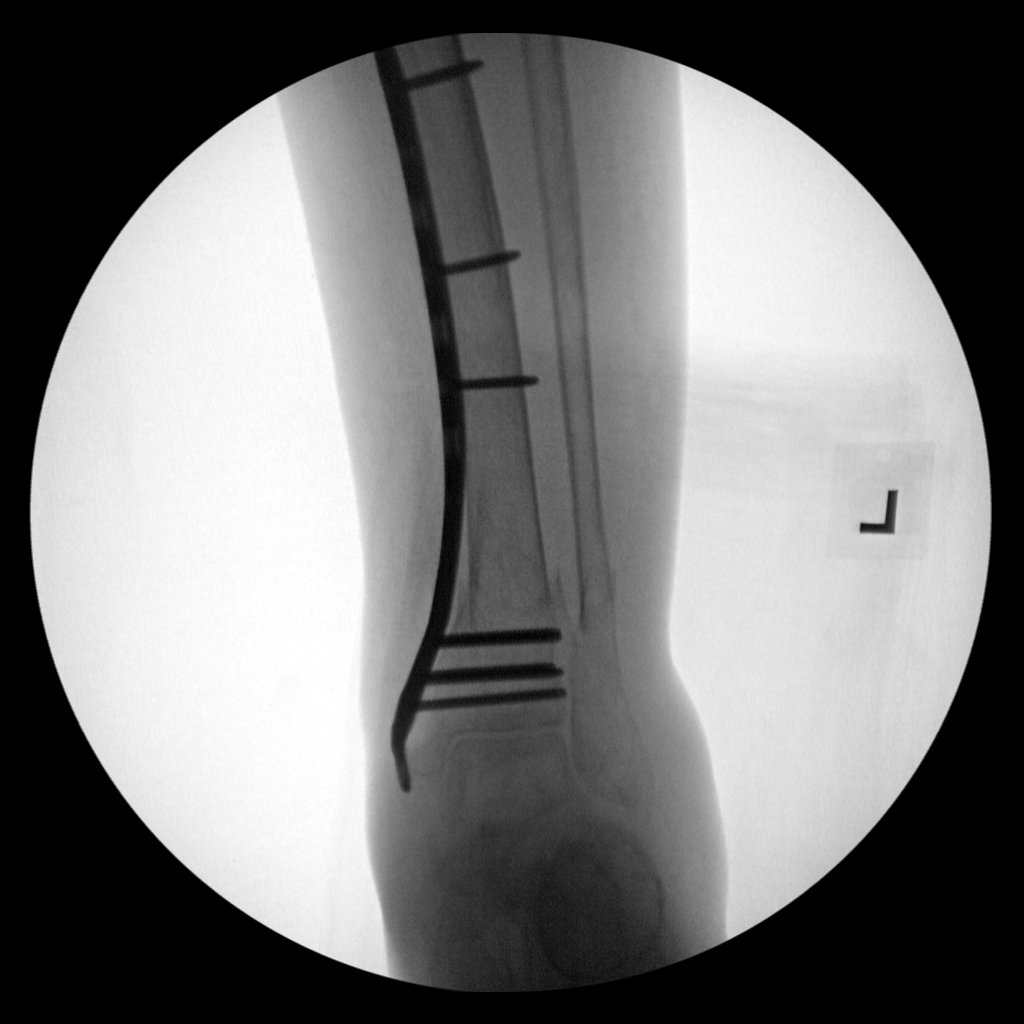
[im 5/5]
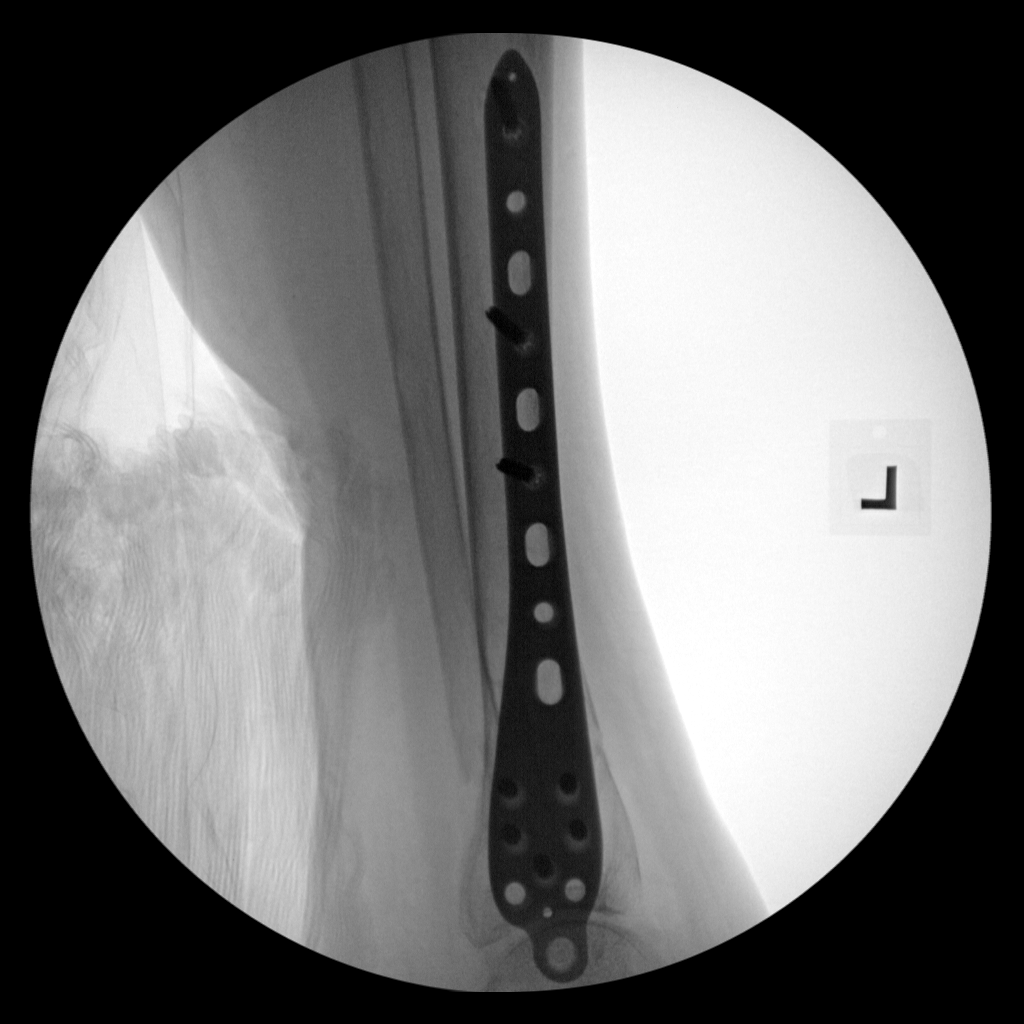

[5 of 5 positions shown; findings below may reference images not displayed]

FINDINGS: Intraoperative fluoroscopic images from sideplate and screw fixation
of comminuted impacted distal left tibial fracture demonstrate
improved alignment and no evidence of immediate complications. There
is an associated mildly comminuted distal fibular fracture.
IMPRESSION: 1. Intraoperative fluoroscopic images from sideplate and screw
fixation of comminuted impacted distal left tibial fracture.
2. Mildly comminuted distal fibular fracture.

## 2021-10-18 IMAGING — RF DG TIBIA/FIBULA 2V*L*
1 series · 5 of 5 positions shown · non-contrast
Comparison: July 19, 2006

CLINICAL DATA: Open reduction internal fixation of left tibial
fracture.

EXAM:
DG C-ARM 1-60 MIN; LEFT TIBIA AND FIBULA - 2 VIEW
FLUOROSCOPY TIME:  Fluoroscopy Time:  1 minutes and 5 seconds
Number of Acquired Spot Images: 0

[Series 1: run · 5 of 5 slices shown]
[im 1/5]
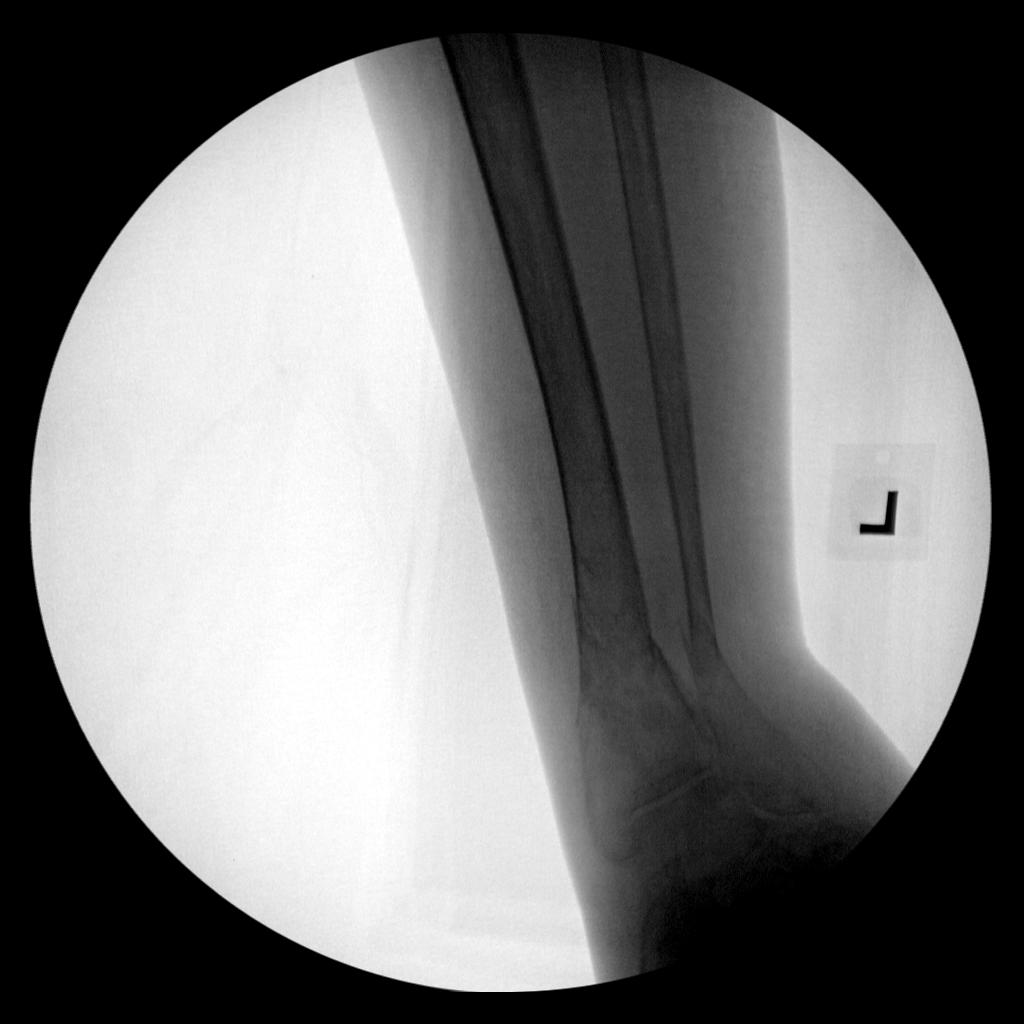
[im 2/5]
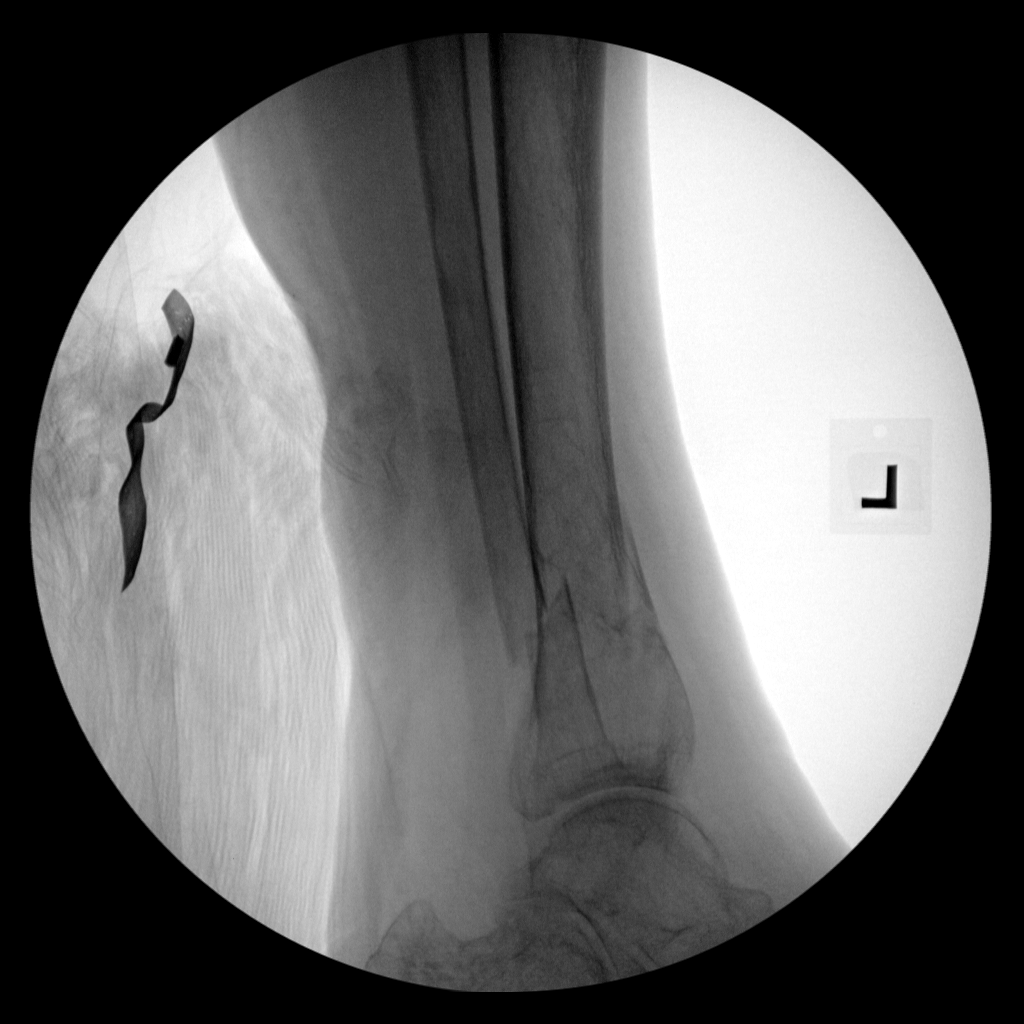
[im 3/5]
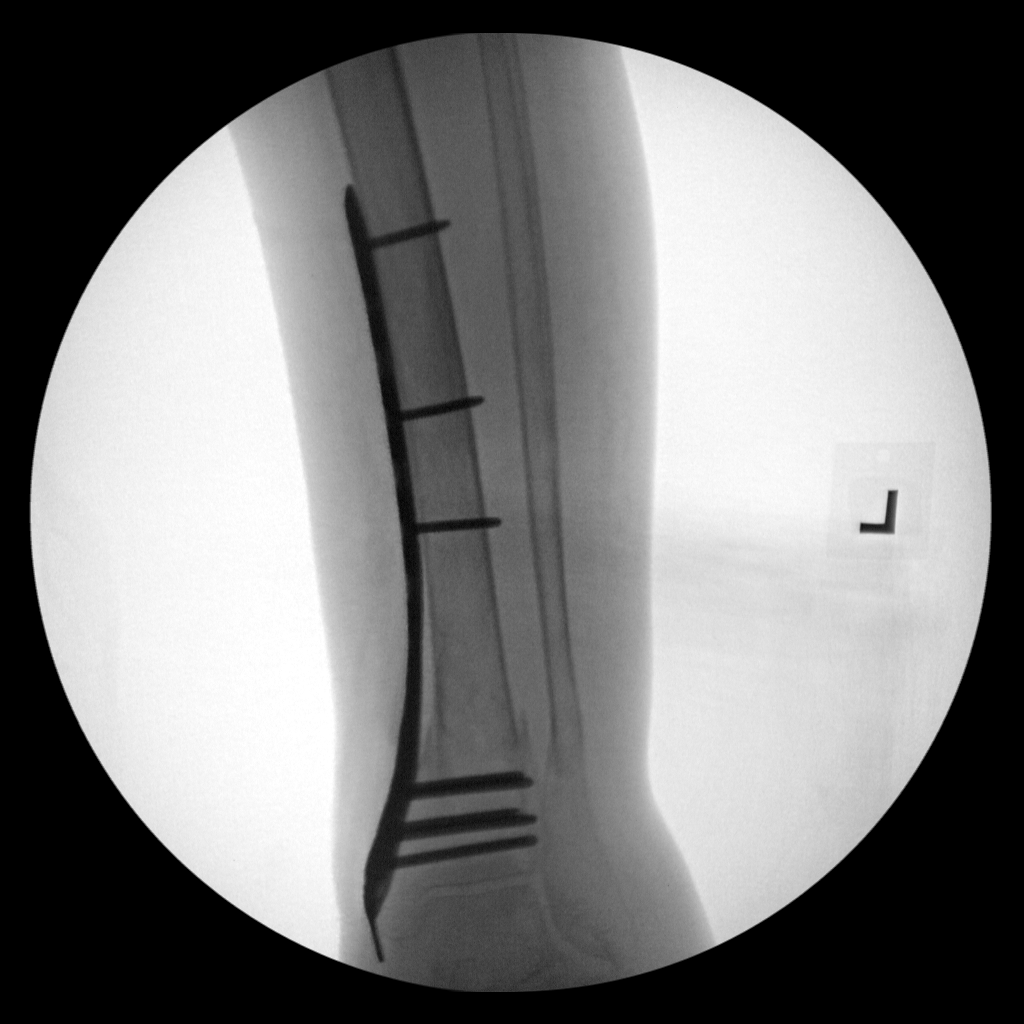
[im 4/5]
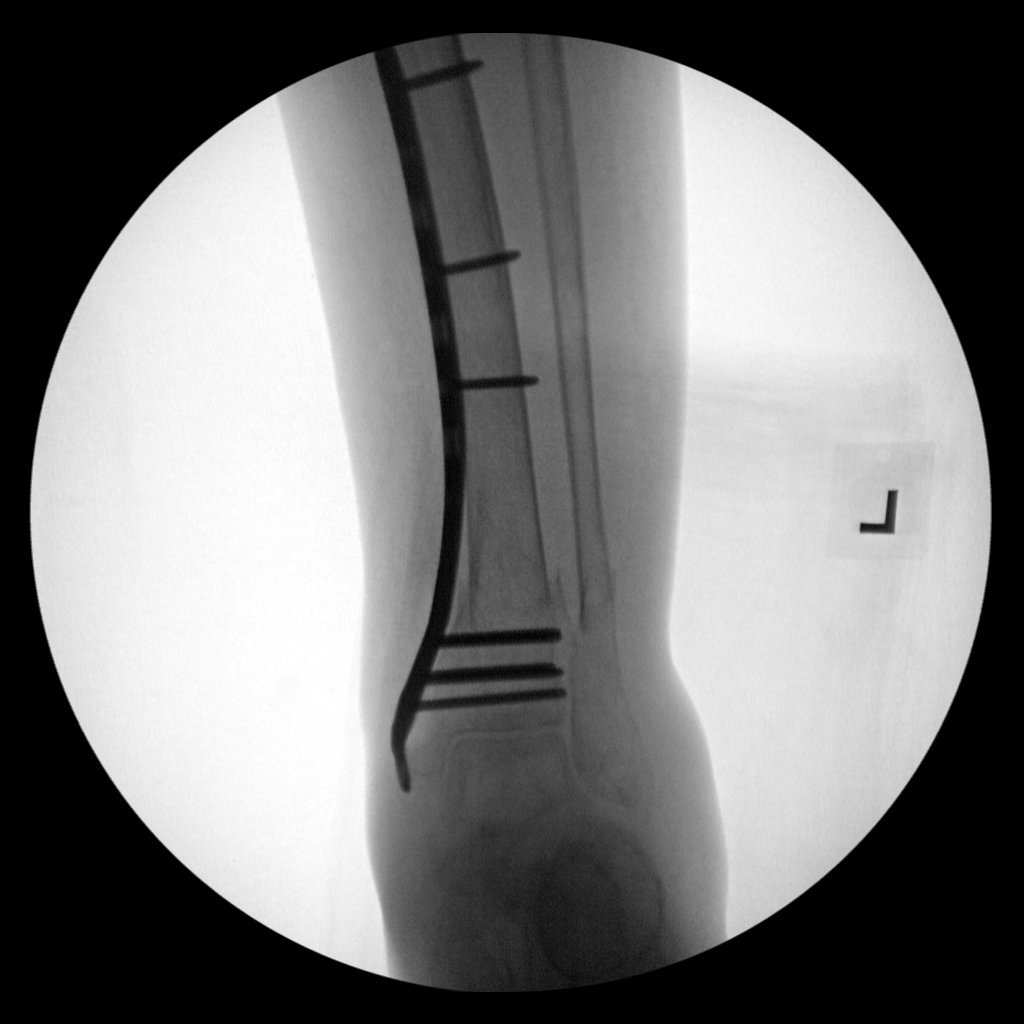
[im 5/5]
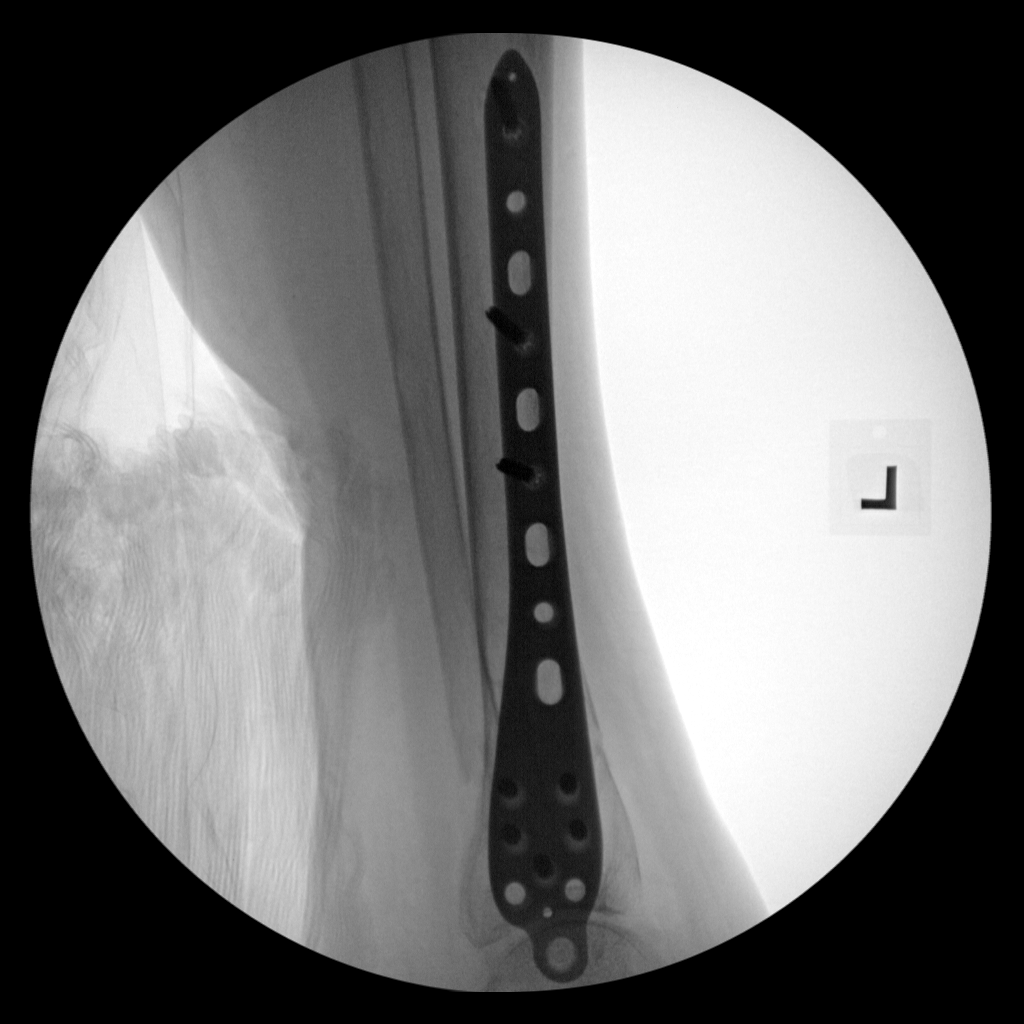

[5 of 5 positions shown; findings below may reference images not displayed]

FINDINGS: Intraoperative fluoroscopic images from sideplate and screw fixation
of comminuted impacted distal left tibial fracture demonstrate
improved alignment and no evidence of immediate complications. There
is an associated mildly comminuted distal fibular fracture.
IMPRESSION: 1. Intraoperative fluoroscopic images from sideplate and screw
fixation of comminuted impacted distal left tibial fracture.
2. Mildly comminuted distal fibular fracture.

## 2022-04-10 ENCOUNTER — Encounter (HOSPITAL_BASED_OUTPATIENT_CLINIC_OR_DEPARTMENT_OTHER): Payer: Medicare Other | Attending: General Surgery | Admitting: General Surgery

## 2022-04-10 DIAGNOSIS — G8220 Paraplegia, unspecified: Secondary | ICD-10-CM | POA: Insufficient documentation

## 2022-04-10 DIAGNOSIS — G35 Multiple sclerosis: Secondary | ICD-10-CM | POA: Diagnosis not present

## 2022-04-10 NOTE — Progress Notes (Signed)
[name], [name] ([id number]) ?Visit Report for 04/10/2022 ?Abuse Risk Screen Details ?Patient Name: Date of Service: ?[name], Kimberly D. 04/10/2022 1:30 PM ?Medical Record Number: [id number] ?Patient Account Number: [id number] ?Date of Birth/Sex: Treating RN: ?[birth year] (76 y.o. F) Deaton, Bobbi ?Primary Care Leeanne Butters: Tillman Abide Other Clinician: ?Referring Donise Woodle: ?Treating Samy Ryner/Extender: Duanne Guess ?[facility] Abide ?Weeks in Treatment: 0 ?Abuse Risk Screen Items ?Answer ?ABUSE RISK SCREEN: ?Has anyone close to you tried to hurt or harm you recentlyo No ?Do you feel uncomfortable with anyone in your familyo No ?Has anyone forced you do things that you didnt want to doo No ?Electronic Signature(s) ?Signed: 04/10/2022 4:53:47 PM By: Shawn Stall RN, BSN ?Entered By: Shawn Stall on 04/10/2022 13:47:03 ?-------------------------------------------------------------------------------- ?Activities of Daily Living Details ?Patient Name: Date of Service: ?[name], Kimberly D. 04/10/2022 1:30 PM ?Medical Record Number: [id number] ?Patient Account Number: [id number] ?Date of Birth/Sex: Treating RN: ?Oct 27, 1946 (76 y.o. F) Deaton, Bobbi ?Primary Care Kaydense Rizo: Till[facility] Abide Other Clinician: ?Referring Lori Popowski: ?Treating Tawnee Clegg/Extender: Duanne Guess ?[facility] Abide ?Weeks in Treatment: 0 ?Activities of Daily Living Items ?Answer ?Activities of Daily Living (Please select one for each item) ?Drive Automobile Not Able ?T Medications ?ake Completely Able ?Use T elephone Completely Able ?Care for Appearance Completely Able ?Use T oilet Not Able ?Bath / Shower Need Assistance ?Dress Self Need Assistance ?Feed Self Completely Able ?Walk Not Able ?Get In / Out Bed Need Assistance ?Housework Not Able ?Prepare Meals Not Able ?Handle Money Not Able ?Shop for Self Not Able ?Electronic Signature(s) ?Signed: 04/10/2022 4:53:47 PM By: Shawn Stall RN, BSN ?Entered By: Shawn Stall on 04/10/2022  13:47:32 ?-------------------------------------------------------------------------------- ?Education Screening Details ?Patient Name: ?Date of Service: ?[name], Kimberly D. 04/10/2022 1:30 PM ?Medical Record Number: [id number] ?Patient Account Number: [id number] ?Date of Birth/Sex: ?Treating RN: ?28-Jul-1946 (76 y.o. F) Deaton, Bobbi ?Primary Care Nitza Schmid: Till[facility] Abide ?Other Clinician: ?Referring Sigfredo Schreier: ?Treating Trinidy Masterson/Extender: Duanne Guess ?[facility] Abide ?Weeks in Treatment: 0 ?Primary Learner Assessed: Patient ?Learning Preferences/Education Level/Primary Language ?Learning Preference: Explanation, Demonstration, Printed Material ?Highest Education Level: High School ?Preferred Language: English ?Cognitive Barrier ?Language Barrier: No ?Translator Needed: No ?Memory Deficit: No ?Emotional Barrier: No ?Cultural/Religious Beliefs Affecting Medical Care: No ?Physical Barrier ?Impaired Vision: No ?Impaired Hearing: No ?Decreased Hand dexterity: No ?Knowledge/Comprehension ?Knowledge Level: High ?Comprehension Level: High ?Ability to understand written instructions: High ?Ability to understand verbal instructions: High ?Motivation ?Anxiety Level: Calm ?Cooperation: Cooperative ?Education Importance: Acknowledges Need ?Interest in Health Problems: Asks Questions ?Perception: Coherent ?Willingness to Engage in Self-Management High ?Activities: ?Readiness to Engage in Self-Management High ?Activities: ?Electronic Signature(s) ?Signed: 04/10/2022 4:53:47 PM By: Shawn Stall RN, BSN ?Entered By: Shawn Stall on 04/10/2022 13:47:55 ?-------------------------------------------------------------------------------- ?Fall Risk Assessment Details ?Patient Name: ?Date of Service: ?[name], Kimberly D. 04/10/2022 1:30 PM ?Medical Record Number: [id number] ?Patient Account Number: [id number] ?Date of Birth/Sex: ?Treating RN: ?07/06/1946 (76 y.o. F) Deaton, Bobbi ?Primary Care Suleman Gunning: [facility] Abide ?Other  Clinician: ?Referring Raenette Sakata: ?Treating Divante Kotch/Extender: Duanne Guess ?[facility] Abide ?Weeks in Treatment: 0 ?Fall Risk Assessment Items ?Have you had 2 or more falls in the last 12 monthso 0 No ?Have you had any fall that resulted in injury in the last 12 monthso 0 No ?FALLS RISK SCREEN ?History of falling - immediate or within 3 months 0 No ?Secondary diagnosis (Do you have 2 or more medical diagnoseso) 0 No ?Ambulatory aid ?None/bed rest/wheelchair/nurse 0 Yes ?Crutches/cane/walker 0 No ?Furniture 0 No ?Intravenous therapy Access/Saline/Heparin Lock 0 No ?Gait/Transferring ?Normal/ bed rest/ wheelchair 0 Yes ?  Weak (short steps with or without shuffle, stooped but able to lift head while walking, may seek 0 No ?support from furniture) ?Impaired (short steps with shuffle, may have difficulty arising from chair, head down, impaired 0 No ?balance) ?Mental Status ?Oriented to own ability 0 Yes ?Electronic Signature(s) ?Signed: 04/10/2022 4:53:47 PM By: Shawn Stall RN, BSN ?Entered By: Shawn Stall on 04/10/2022 13:48:03 ?-------------------------------------------------------------------------------- ?Foot Assessment Details ?Patient Name: ?Date of Service: ?[name], Kimberly D. 04/10/2022 1:30 PM ?Medical Record Number: [id number] ?Patient Account Number: [id number] ?Date of Birth/Sex: ?Treating RN: ?01/17/1946 (76 y.o. F) Deaton, Bobbi ?Primary Care Zoa Dowty: [facility] Abide ?Other Clinician: ?Referring Masha Orbach: ?Treating Rhonda Linan/Extender: Duanne Guess ?[facility]ide ?Weeks in Treatment: 0 ?Foot Assessment Items ?Site Locations ?+ = Sensation present, - = Sensation absent, C = Callus, U = Ulcer ?R = Redness, W = Warmth, M = Maceration, PU = Pre-ulcerative lesion ?F = Fissure, S = Swelling, D = Dryness ?Assessment ?Right: Left: ?Other Deformity: No No ?Prior Foot Ulcer: No No ?Prior Amputation: No No ?Charcot Joint: No No ?Ambulatory Status: Non-ambulatory ?Assistance Device:  Wheelchair ?Gait: ?Notes ?No BLE wounds. ?Electronic Signature(s) ?Signed: 04/10/2022 4:53:47 PM By: Shawn Stall RN, BSN ?Entered By: Shawn Stall on 04/10/2022 13:48:35 ?-------------------------------------------------------------------------------- ?Nutrition Risk Screening Details ?Patient Name: ?Date of Service: ?[name], Kimberly D. 04/10/2022 1:30 PM ?Medical Record Number: [id number] ?Patient Account Number: [id number] ?Date of Birth/Sex: ?Treating RN: ?[birth year] (76 y.o. F) Deaton, Bobbi ?Primary Care Dakayla Disanti: [facility] Abide ?Other Clinician: ?Referring Remi Lopata: ?Treating Naeem Quillin/Extender: Duanne Guess ?[facility] Abide ?Weeks in Treatment: 0 ?Height (in): ?Weight (lbs): ?Body Mass Index (BMI): ?Nutrition Risk Screening Items ?Score Screening ?NUTRITION RISK SCREEN: ?I have an illness or condition that made me change the kind and/or amount of food I eat 2 Yes ?I eat fewer than two meals per day 0 No ?I eat few fruits and vegetables, or milk products 0 No ?I have three or more drinks of beer, liquor or wine almost every day 0 No ?I have tooth or mouth problems that make it hard for me to eat 0 No ?I don't always have enough money to buy the food I need 0 No ?I eat alone most of the time 0 No ?I take three or more different prescribed or over-the-counter drugs a day 0 No ?Without wanting to, I have lost or gained 10 pounds in the last six months 0 No ?I am not always physically able to shop, cook and/or feed myself 0 No ?Nutrition Protocols ?Good Risk Protocol 0 No interventions needed ?Moderate Risk Protocol ?High Risk Proctocol ?Risk Level: Good Risk ?Score: 2 ?Electronic Signature(s) ?Signed: 04/10/2022 4:53:47 PM By: Shawn Stall RN, BSN ?Entered By: Shawn Stall on 04/10/2022 13:48:16 ?

## 2022-04-10 NOTE — Progress Notes (Signed)
TIMECA, VALIDO ([id number]) ?Visit Report for 04/10/2022 ?Allergy List Details ?Patient Name: Date of Service: ?[name], Kimberly D. 04/10/2022 1:30 PM ?Medical Record Number: [id number] ?Patient Account Number: [id number] ?Date of Birth/Sex: Treating RN: ?12/27/1945 (76 y.o. F) Deaton, Bobbi ?Primary Care Amaria Mundorf: Viviana Simpler Other Clinician: ?Referring Oronde Hallenbeck: ?Treating Shareeka Yim/Extender: Fredirick Maudlin ?Viviana Simpler ?Weeks in Treatment: 0 ?Allergies ?Active Allergies ?baclofen ?Reaction: diarrhea, N/V ?lidocaine ?Reaction: rash ?Allergy Notes ?Electronic Signature(s) ?Signed: 04/10/2022 4:53:47 PM By: Deon Pilling RN, BSN ?Entered By: Deon Pilling on 04/10/2022 13:51:19 ?-------------------------------------------------------------------------------- ?Arrival Information Details ?Patient Name: Date of Service: ?[name], Kimberly D. 04/10/2022 1:30 PM ?Medical Record Number: [id number] ?Patient Account Number: [id number] ?Date of Birth/Sex: Treating RN: ?1945-12-28 (76 y.o. F) Deaton, Bobbi ?Primary Care Trenda Corliss: Viviana Simpler Other Clinician: ?Referring Torien Ramroop: ?Treating Trinitie Mcgirr/Extender: Fredirick Maudlin ?Viviana Simpler ?Weeks in Treatment: 0 ?Visit Information ?Patient Arrived: Wheel Chair ?Arrival Time: 13:41 ?Accompanied By: son ?Transfer Assistance: Manual ?Patient Identification Verified: Yes ?Secondary Verification Process Completed: Yes ?Patient Requires Transmission-Based Precautions: No ?Patient Has Alerts: No ?Electronic Signature(s) ?Signed: 04/10/2022 4:53:47 PM By: Deon Pilling RN, BSN ?Entered By: Deon Pilling on 04/10/2022 13:46:44 ?-------------------------------------------------------------------------------- ?Clinic Level of Care Assessment Details ?Patient Name: Date of Service: ?[name], Kimberly D. 04/10/2022 1:30 PM ?Medical Record Number: [id number] ?Patient Account Number: [id number] ?Date of Birth/Sex: Treating RN: ?[birth year] (76 y.o. F) Deaton, Bobbi ?Primary Care Ellery Meroney:  Viviana Simpler Other Clinician: ?Referring Kajah Santizo: ?Treating Yalanda Soderman/Extender: Fredirick Maudlin ?Viviana Simpler ?Weeks in Treatment: 0 ?Clinic Level of Care Assessment Items ?TOOL 2 Quantity Score ?X- 1 0 ?Use when only an EandM is performed on the INITIAL visit ?ASSESSMENTS - Nursing Assessment / Reassessment ?X- 1 20 ?General Physical Exam (combine w/ comprehensive assessment (listed just below) when performed on new pt. evals) ?X- 1 25 ?Comprehensive Assessment (HX, ROS, Risk Assessments, Wounds Hx, etc.) ?ASSESSMENTS - Wound and Skin A ssessment / Reassessment ?[]  - 0 ?Simple Wound Assessment / Reassessment - one wound ?[]  - 0 ?Complex Wound Assessment / Reassessment - multiple wounds ?X- 1 10 ?Dermatologic / Skin Assessment (not related to wound area) ?ASSESSMENTS - Ostomy and/or Continence Assessment and Care ?[]  - 0 ?Incontinence Assessment and Management ?[]  - 0 ?Ostomy Care Assessment and Management (repouching, etc.) ?PROCESS - Coordination of Care ?X - Simple Patient / Family Education for ongoing care 1 15 ?[]  - 0 ?Complex (extensive) Patient / Family Education for ongoing care ?X- 1 10 ?Staff obtains Consents, Records, T Results / Process Orders ?est ?[]  - 0 ?Staff telephones HHA, Nursing Homes / Clarify orders / etc ?[]  - 0 ?Routine Transfer to another Facility (non-emergent condition) ?[]  - 0 ?Routine Hospital Admission (non-emergent condition) ?[]  - 0 ?New Admissions / Biomedical engineer / Ordering NPWT Apligraf, etc. ?, ?[]  - 0 ?Emergency Hospital Admission (emergent condition) ?X- 1 10 ?Simple Discharge Coordination ?[]  - 0 ?Complex (extensive) Discharge Coordination ?PROCESS - Special Needs ?[]  - 0 ?Pediatric / Minor Patient Management ?[]  - 0 ?Isolation Patient Management ?[]  - 0 ?Hearing / Language / Visual special needs ?[]  - 0 ?Assessment of Community assistance (transportation, D/C planning, etc.) ?[]  - 0 ?Additional assistance / Altered mentation ?[]  - 0 ?Support Surface(s)  Assessment (bed, cushion, seat, etc.) ?INTERVENTIONS - Wound Cleansing / Measurement ?[]  - 0 ?Wound Imaging (photographs - any number of wounds) ?[]  - 0 ?Wound Tracing (instead of photographs) ?[]  - 0 ?Simple Wound Measurement - one wound ?[]  - 0 ?Complex Wound Measurement - multiple wounds ?[]  - 0 ?Simple Wound  Cleansing - one wound ?[]  - 0 ?Complex Wound Cleansing - multiple wounds ?INTERVENTIONS - Wound Dressings ?[]  - 0 ?Small Wound Dressing one or multiple wounds ?[]  - 0 ?Medium Wound Dressing one or multiple wounds ?[]  - 0 ?Large Wound Dressing one or multiple wounds ?[]  - 0 ?Application of Medications - injection ?INTERVENTIONS - Miscellaneous ?[]  - 0 ?External ear exam ?[]  - 0 ?Specimen Collection (cultures, biopsies, blood, body fluids, etc.) ?[]  - 0 ?Specimen(s) / Culture(s) sent or taken to Lab for analysis ?[]  - 0 ?Patient Transfer (multiple staff / Civil Service fast streamer / Similar devices) ?[]  - 0 ?Simple Staple / Suture removal (25 or less) ?[]  - 0 ?Complex Staple / Suture removal (26 or more) ?[]  - 0 ?Hypo / Hyperglycemic Management (close monitor of Blood Glucose) ?[]  - 0 ?Ankle / Brachial Index (ABI) - do not check if billed separately ?Has the patient been seen at the hospital within the last three years: Yes ?Total Score: 90 ?Level Of Care: New/Established - Level 3 ?Electronic Signature(s) ?Signed: 04/10/2022 4:53:47 PM By: Deon Pilling RN, BSN ?Entered By: Deon Pilling on 04/10/2022 14:25:10 ?-------------------------------------------------------------------------------- ?Encounter Discharge Information Details ?Patient Name: Date of Service: ?[name], Kimberly D. 04/10/2022 1:30 PM ?Medical Record Number: [id number] ?Patient Account Number: [id number] ?Date of Birth/Sex: Treating RN: ?[birth year] (76 y.o. F) Deaton, Bobbi ?Primary Care Loxley Schmale: Viviana Simpler Other Clinician: ?Referring Isayah Ignasiak: ?Treating Saladin Petrelli/Extender: Fredirick Maudlin ?Viviana Simpler ?Weeks in Treatment: 0 ?Encounter Discharge  Information Items ?Discharge Condition: Stable ?Ambulatory Status: Wheelchair ?Discharge Destination: Home ?Transportation: Private Auto ?Accompanied By: son ?Schedule Follow-up Appointment: No ?Clinical Summary of Care: Patient Declined ?Electronic Signature(s) ?Signed: 04/10/2022 4:53:47 PM By: Deon Pilling RN, BSN ?Entered By: Deon Pilling on 04/10/2022 14:26:26 ?-------------------------------------------------------------------------------- ?Lower Extremity Assessment Details ?Patient Name: Date of Service: ?[name], Kimberly D. 04/10/2022 1:30 PM ?Medical Record Number: [id number] ?Patient Account Number: [id number] ?Date of Birth/Sex: ?Treating RN: ?04/21/1946 (76 y.o. F) Deaton, Bobbi ?Primary Care Laiken Nohr: Viviana Simpler ?Other Clinician: ?Referring Kaelan Amble: ?Treating Kalani Sthilaire/Extender: Fredirick Maudlin ?Viviana Simpler ?Weeks in Treatment: 0 ?Electronic Signature(s) ?Signed: 04/10/2022 4:53:47 PM By: Deon Pilling RN, BSN ?Entered By: Deon Pilling on 04/10/2022 13:48:42 ?-------------------------------------------------------------------------------- ?Multi Wound Chart Details ?Patient Name: ?Date of Service: ?[name], Kimberly D. 04/10/2022 1:30 PM ?Medical Record Number: [id number] ?Patient Account Number: [id number] ?Date of Birth/Sex: ?Treating RN: ?1946-11-07 (76 y.o. F) ?Primary Care Harrie Cazarez: Viviana Simpler ?Other Clinician: ?Referring Shaunak Kreis: ?Treating Ameris Akamine/Extender: Fredirick Maudlin ?Viviana Simpler ?Weeks in Treatment: 0 ?Vital Signs ?Height(in): 63 ?Pulse(bpm): 57 ?Weight(lbs): 128 ?Blood Pressure(mmHg): 134/69 ?Body Mass Index(BMI): 22.7 ?Temperature(??F): 97.9 ?Respiratory Rate(breaths/min): 20 ?Wound Assessments ?Treatment Notes ?Electronic Signature(s) ?Signed: 04/10/2022 3:23:55 PM By: Fredirick Maudlin MD FACS ?Entered By: Fredirick Maudlin on 04/10/2022 15:23:54 ?-------------------------------------------------------------------------------- ?Multi-Disciplinary Care Plan Details ?Patient  Name: ?Date of Service: ?[name], Kimberly D. 04/10/2022 1:30 PM ?Medical Record Number: [id number] ?Patient Account Number: [id number] ?Date of Birth/Sex: ?Treating RN: ?March 19, 1946 (76 y.o. F) Deaton, Bobbi ?Prim

## 2022-04-10 NOTE — Progress Notes (Signed)
[name], [name] ([id number]) ?Visit Report for 04/10/2022 ?Chief Complaint Document Details ?Patient Name: Date of Service: ?[name], Kimberly D. 04/10/2022 1:30 PM ?Medical Record Number: [id number] ?Patient Account Number: [id number] ?Date of Birth/Sex: Treating RN: ?29-Jul-1946 (76 y.o. F) ?Primary Care Provider: Viviana Simpler Other Clinician: ?Referring Provider: ?Treating Provider/Extender: Fredirick Maudlin ?Viviana Simpler ?Weeks in Treatment: 0 ?Information Obtained from: Patient ?Chief Complaint ?Patient presents to the wound care center due with non-wound condition(s) ?Electronic Signature(s) ?Signed: 04/10/2022 3:24:04 PM By: Fredirick Maudlin MD FACS ?Entered By: Fredirick Maudlin on 04/10/2022 15:24:04 ?-------------------------------------------------------------------------------- ?HPI Details ?Patient Name: Date of Service: ?[name], Kimberly D. 04/10/2022 1:30 PM ?Medical Record Number: [id number] ?Patient Account Number: [id number] ?Date of Birth/Sex: Treating RN: ?January 21, 1946 (76 y.o. 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 She had been seen last year in the wound clinic in ?[place] for a sacral pressure ulcer. This healed and she has been on a sleep number bed. Apparently there was some concern that she was developing a ?wound on her left trochanter, due to lying on that side in an effort to avoid pressure on her sacral area. The patient's son got the sleep number bed in response ?to this, she did not qualify for a medical level 2 low-air-loss mattress. The wound never came to fruition and the skin erythema resolved. She has many ?complaints and concerns about potentially developing another ulcer. She  says that she cannot turn herself and is interested in a low-air-loss mattress. She ?refuses to lie on her back given her history of prior sacral ulceration. She does not have any open wounds at this time. ?Electronic Signature(s) ?Signed: 04/10/2022 3:26:33 PM By: Fredirick Maudlin MD FACS ?Entered By: Fredirick Maudlin on 04/10/2022 15:26:33 ?-------------------------------------------------------------------------------- ?Physical Exam Details ?Patient Name: Date of Service: ?[name], Kimberly D. 04/10/2022 1:30 PM ?Medical Record Number: [id number] ?Patient Account Number: [id number] ?Date of Birth/Sex: Treating RN: ?18-Nov-1946 (76 y.o. F) ?Primary Care Provider: Viviana Simpler Other Clinician: ?Referring Provider: ?Treating Provider/Extender: Fredirick Maudlin ?Viviana Simpler ?Weeks in Treatment: 0 ?Constitutional ?. Bradycardic, asymptomatic.. . . No acute distress. ?Respiratory ?Normal work of breathing on room air.Marland Kitchen ?Cardiovascular ?Bilateral lower extremities held in contracture.Marland Kitchen ?Notes ?04/10/2022: The patient has no open wounds. The prior sacral wound has healed nicely and the skin is intact both at the wound site and the periwound. ?Electronic Signature(s) ?Signed: 04/10/2022 3:27:32 PM By: Fredirick Maudlin MD FACS ?Entered By: Fredirick Maudlin on 04/10/2022 15:27:32 ?-------------------------------------------------------------------------------- ?Physician Orders Details ?Patient Name: Date of Service: ?[name], Kimberly D. 04/10/2022 1:30 PM ?Medical Record Number: [id number] ?Patient Account Number: [id number] ?Date of Birth/Sex: Treating RN: ?September 12, 1946 (76 y.o. F) Deaton, Bobbi ?Primary Care Provider: Viviana Simpler Other Clinician: ?Referring Provider: ?Treating Provider/Extender: Fredirick Maudlin ?Viviana Simpler ?Weeks in Treatment: 0 ?Verbal / Phone Orders: No ?Diagnosis Coding ?ICD-10 Coding ?Code Description ?G35 Multiple sclerosis ?G82.20 Paraplegia, unspecified ?Discharge From [facility] ?Discharge  from [facility] - Call if any future wound care needs. ?Off-Loading ?Gel wheelchair cushion - continue to use your specialty wheelchair cushion. ?Turn and reposition every 2 hours ?Electronic Signature(s) ?Signed: 04/10/2022 3:28:43 PM By: Fredirick Maudlin MD FACS ?Entered By: Fredirick Maudlin on 04/10/2022 15:28:43 ?-------------------------------------------------------------------------------- ?Problem List Details ?Patient Name: Date of Service: ?[name], Kimberly D. 04/10/2022  1:30 PM ?Medical Record Number: [id number] ?Patient Account Number: [id number] ?Date of Birth/Sex: Treating RN: ?1946/07/13 (76 y.o. F) ?Primary Care Provider: Viviana Simpler Other Clinician: ?Referring Provider: ?Treating Provider/Extender: Fredirick Maudlin ?Viviana Simpler ?Weeks in Treatment: 0 ?Active Problems ?ICD-10 ?Encounter ?Code Description Active Date MDM ?Diagnosis ?G35 Multiple sclerosis 04/10/2022 No Yes ?G82.20 Paraplegia, unspecified 04/10/2022 No Yes ?Inactive Problems ?Resolved Problems ?Electronic Signature(s) ?Signed: 04/10/2022 3:23:51 PM By: Fredirick Maudlin MD FACS ?Entered By: Fredirick Maudlin on 04/10/2022 15:23:50 ?-------------------------------------------------------------------------------- ?Progress Note Details ?Patient Name: Date of Service: ?[name], Kimberly D. 04/10/2022 1:30 PM ?Medical Record Number: [id number] ?Patient Account Number: [id number] ?Date of Birth/Sex: Treating RN: ?05-24-1946 (76 y.o. 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 She had been seen last year in the  wound clinic in ?[place] for a sacral pressure ulcer. This healed and she has been on a sleep number bed. Apparently there was some concern that she was developing a ?wound on her left trochanter, due to lying on that side in an effort to avoid pressure on her sacral area. The patient's son got the sleep number bed in response ?to this, she did not qualify for a medical level 2 low-air-loss mattress. The wound never came to fruition and the skin erythema resolved. She has many ?complaints and concerns about potentially developing another ulcer. She says that she cannot turn herself and is interested in a low-air-loss mattress. She ?refuses to lie on her back given her history of prior sacral ulceration. She does not have any open wounds at this time. ?Patient History ?Allergies ?baclofen (Reaction: diarrhea, N/V), lidocaine (Reaction: rash) ?Family History ?Stroke - Mother,Father. ?Social History ?Never smoker, Marital Status - Widowed, Alcohol Use - Never, Drug Use - No History, Caffeine Use - Never. ?Medical History ?Neurologic ?Denies history of Paraplegia ?Hospitalization/Surgery History - 11/21 reverse right shoulder arthoplasty. - 10/21 ORIF left leg. - 2008 L5, S1 disc surgery. - hysterectomy. ?Medical A Surgical History Notes ?nd ?Constitutional Symptoms (General Health) ?multiple sclerosis contracted lower legs from 4 months of skilled nursing facility ?Cardiovascular ?mitral valve prolapse 1979 ?Genitourinary ?neurogenic bladder ?Neurologic ?MS ?Objective ?Constitutional ?Bradycardic, asymptomatic.Marland Kitchen No acute distress. ?Vitals Time Taken: 1:45 PM, Height: 63 in, Source: Stated, Weight: 128 lbs, Source: Stated, BMI: 22.7, Temperature: 97.9 ??F, Pulse: 57 bpm, Respiratory Rate: ?20 breaths/min, Blood Pressure: 134/69 mmHg. ?Respiratory ?Normal work of breathing on room air.Marland Kitchen ?Cardiovascular ?Bilateral lower extremities held in contracture.Marland Kitchen ?General Notes: 04/10/2022: The patient has no open wounds. The  prior sacral wound has healed nicely and the skin is intact both at the wound site and the ?periwound. ?Assessment ?Active Problems ?ICD-10 ?Multiple sclerosis ?Paraplegia, unspecified ?Plan ?Discharge Fr

## 2022-04-14 ENCOUNTER — Ambulatory Visit: Payer: Medicare Other | Admitting: Physician Assistant

## 2022-07-08 ENCOUNTER — Telehealth: Payer: Self-pay | Admitting: Internal Medicine

## 2022-07-08 NOTE — Telephone Encounter (Signed)
[facility] Day - Client TELEPHONE ADVICE RECORD [facility] Patient Name: EIZA CANNIFF [facility] Gender: Female DOB: November 02, 1946 Age: 76 Y 3 M 4 D Return Phone Number: [phone] (Primary), [phone] (Secondary) Address: [address] City/ State/ Zip: [place]  [address] Client [facility] Day - Client Client Site [facility] - Day Provider Tillman Abide- MD Contact Type Call Who Is Calling Patient / Member / Family / Caregiver Call Type Triage / Clinical Relationship To Patient Self Return Phone Number [phone] (Primary) Chief Complaint CHEST PAIN - pain, pressure, heaviness or tightness Reason for Call Symptomatic / Request for Health Information Initial Comment Call sent from service. Caller states she reached around to her bottom area last Monday and then got severe pain in her chest and back when taking a deep breath. Translation No Nurse Assessment Nurse: Kizzie Bane, RN, Marylene Land Date/Time (Eastern Time): 07/08/2022 9:16:30 AM Confirm and document reason for call. If symptomatic, describe symptoms. ---Caller states she is feeling like she can't take a deep breath because it hurts her chest Does the patient have any new or worsening symptoms? ---Yes Will a triage be completed? ---Yes Related visit to physician within the last 2 weeks? ---No Does the PT have any chronic conditions? (i.e. diabetes, asthma, this includes High risk factors for pregnancy, etc.) ---Yes List chronic conditions. ---MS Is this a behavioral health or substance abuse call? ---No Guidelines Guideline Title Affirmed Question Affirmed Notes Nurse Date/Time Lamount Cohen Time) Chest Pain [1] Chest pain lasts > 5 minutes AND [2] age > 94 Kizzie Bane, RN, Marylene Land 07/08/2022 9:17:51 AM Disp. Time Lamount Cohen Time) Disposition Final User 07/08/2022 9:13:04 AM Send to Urgent Queue Ardelle Anton 07/08/2022 9:22:29 AM Call EMS 911  Now Yes Kizzie Bane, RN, Marylene Land 07/08/2022 9:30:35 AM 911 Outcome Documentation Kizzie Bane, RN, Marylene Land PLEASE NOTE: All timestamps contained within this report are represented as [place] Standard Time. CONFIDENTIALTY NOTICE: This fax transmission is intended only for the addressee. It contains information that is legally privileged, confidential or otherwise protected from use or disclosure. If you are not the intended recipient, you are strictly prohibited from reviewing, disclosing, copying using or disseminating any of this information or taking any action in reliance on or regarding this information. If you have received this fax in error, please notify us immediately by telephone so that we can arrange for its return to [place]. Phone: [phone], Toll-Free: [phone], Fax: [phone] Page: 2 of 2 Call Id: [id number] Disp. Time (Eastern Time) Disposition Final User Reason: Caller states she will call the ambulance. Called back in 5 minutes and she states she is going to call the ambulance now Final Disposition 07/08/2022 9:22:29 AM Call EMS 911 Now Yes Kizzie Bane, RN, Rosalyn Charters Disagree/Comply Comply Caller Understands Yes PreDisposition Did not know what to do Care Advice Given Per Guideline CALL EMS 911 NOW: * Immediate medical attention is needed. You need to hang up and call 911 (or an ambulance). * Triager Discretion: I'll call you back in a few minutes to be sure you were able to reach them. CARE ADVICE given per Chest Pain (Adult) guideline

## 2022-07-08 NOTE — Telephone Encounter (Signed)
Agree with nurses recommendation for earlier appointment.

## 2022-07-08 NOTE — Telephone Encounter (Signed)
Patient called and stated that she reached behind her bottom and every since then her chest has been sore and has not been able to take a deep breath. Patient was sent to access nurse.

## 2022-07-08 NOTE — Telephone Encounter (Signed)
Unable to reach pt by phone and spoke with pts son (DPRsigned) and he spoke with his mother earlier and she advised him that she was going to ED by EMS. Pts son will ck with another lady who might would know where pt is right now and I am to call [name] back for update. I spoke with pts son and a friend advised EMS came to house and did EKG and other testing and pt declined going to ED. Pt then called office and I spoke with pt and she said EMS told her her BP and P and EKG was OK and since pt declined to go to ED pt was advised by EMS to take advil and apply ice to area and if not better to be seen on 07/10/22. Pt said she has not had any recent fall or injury. Pt said 1 wk ago she was reaching around to her back and had an immediate sharp pain that has continued on and off for 1 wk when pt tries to take a deep breath at which time pt experiences CP and SOB. I asked pt if she had osteoporosis and pt said yes, very bad almost like no bones. I offered to schedule pt an appt at [facility] [place] office or [facility] and pt declined. I offered pt an appt at [facility] on 07/09/22. Pt declined and said she was going to wait until Thursday like fireman had said and pt will cb for appt if needed. UC & ED precautions given and pt voiced understanding. Sending note to Dr Alphonsus Sias who is out of office and Dr.Cody who is in office and [place] CMA.

## 2022-07-08 NOTE — Telephone Encounter (Signed)
Pt's son notified as instructed and voiced understanding and said he would talk with pt when he gets home later. He is still at work now. I also notified pt and she said she is still hurting but does not want to pay for ambulance to take her to ED for eval and I asked if son could take her and she said he would have to lift her so she will talk with him and decide what to do. Pt will cb tomorrow with update of how she is feeling and what she decided to do. Sending note to Dr Alphonsus Sias, Dr Selena Batten, lsc triage and [facility] CMA.

## 2022-07-09 ENCOUNTER — Emergency Department (HOSPITAL_COMMUNITY)
Admission: EM | Admit: 2022-07-09 | Discharge: 2022-07-09 | Disposition: A | Discharge status: Home / Self Care | Payer: Medicare Other | Attending: Emergency Medicine | Admitting: Emergency Medicine

## 2022-07-09 ENCOUNTER — Emergency Department (HOSPITAL_COMMUNITY): Payer: Medicare Other

## 2022-07-09 ENCOUNTER — Encounter (HOSPITAL_COMMUNITY): Payer: Self-pay

## 2022-07-09 ENCOUNTER — Other Ambulatory Visit: Payer: Self-pay

## 2022-07-09 DIAGNOSIS — R0781 Pleurodynia: Secondary | ICD-10-CM | POA: Diagnosis present

## 2022-07-09 DIAGNOSIS — N1330 Unspecified hydronephrosis: Secondary | ICD-10-CM | POA: Insufficient documentation

## 2022-07-09 DIAGNOSIS — T80818A Extravasation of other vesicant agent, initial encounter: Secondary | ICD-10-CM | POA: Insufficient documentation

## 2022-07-09 DIAGNOSIS — R0602 Shortness of breath: Secondary | ICD-10-CM | POA: Diagnosis not present

## 2022-07-09 LAB — HEPATIC FUNCTION PANEL
ALT: 24 U/L (ref 0–44)
AST: 25 U/L (ref 15–41)
Albumin: 3.4 g/dL — ABNORMAL LOW (ref 3.5–5.0)
Alkaline Phosphatase: 102 U/L (ref 38–126)
Bilirubin, Direct: 0.3 mg/dL — ABNORMAL HIGH (ref 0.0–0.2)
Indirect Bilirubin: 0.4 mg/dL (ref 0.3–0.9)
Total Bilirubin: 0.7 mg/dL (ref 0.3–1.2)
Total Protein: 6.9 g/dL (ref 6.5–8.1)

## 2022-07-09 LAB — CBC
HCT: 44.3 % (ref 36.0–46.0)
Hemoglobin: 14.5 g/dL (ref 12.0–15.0)
MCH: 30.5 pg (ref 26.0–34.0)
MCHC: 32.7 g/dL (ref 30.0–36.0)
MCV: 93.1 fL (ref 80.0–100.0)
Platelets: 429 10*3/uL — ABNORMAL HIGH (ref 150–400)
RBC: 4.76 MIL/uL (ref 3.87–5.11)
RDW: 12.3 % (ref 11.5–15.5)
WBC: 11.2 10*3/uL — ABNORMAL HIGH (ref 4.0–10.5)
nRBC: 0 % (ref 0.0–0.2)

## 2022-07-09 LAB — BASIC METABOLIC PANEL
Anion gap: 6 (ref 5–15)
BUN: 19 mg/dL (ref 8–23)
CO2: 28 mmol/L (ref 22–32)
Calcium: 9.4 mg/dL (ref 8.9–10.3)
Chloride: 107 mmol/L (ref 98–111)
Creatinine, Ser: 0.49 mg/dL (ref 0.44–1.00)
GFR, Estimated: >60 mL/min (ref >60)
Glucose, Bld: 95 mg/dL (ref 70–99)
Potassium: 4.2 mmol/L (ref 3.5–5.1)
Sodium: 141 mmol/L (ref 135–145)

## 2022-07-09 LAB — TROPONIN I (HIGH SENSITIVITY)
Troponin I (High Sensitivity): 3 ng/L (ref <18)
Troponin I (High Sensitivity): 3 ng/L (ref <18)

## 2022-07-09 LAB — LIPASE, BLOOD: Lipase: 41 U/L (ref 11–51)

## 2022-07-09 MED ORDER — IOHEXOL 350 MG/ML SOLN
100.0000 mL | Freq: Once | INTRAVENOUS | Status: AC | PRN
  Administered 2022-07-09: 100 mL via INTRAVENOUS

## 2022-07-09 NOTE — Telephone Encounter (Signed)
Pt called back this morning and pt is still having chest pain and pt thinks she will call 911 to pick pt up and take to [facility] ED. Pt said she spoke with Medicare this morning and was told medicare would cover if doctor thought pt should go by ambulance to hospital and that it was heart related. Pt said she cannot stand or walk. Pt said son has problem with his hand and does not think he can lift her right now. I advised that per 07/08/22 note providers think pt should be seen before Thursday and I cannot speak to whether medicare will pay or not for ambulance ride but if pt is having CP and cannot stand or walk and there is no other form of transportation then reasonable to call EMS to take pt to ED. Pt said she would call fire dept to see if they could take pt to ED and if not pt will call 911. Sending note to Dr Alphonsus Sias and Dr Selena Batten as Lorain Childes.

## 2022-07-09 NOTE — ED Notes (Addendum)
CT called stating that IV line did not work.  CT put in a new line that infiltrated on left foream.   Pt was brought back without getting imaging completed.  MD Dixon notified.  IV team consult requested.

## 2022-07-09 NOTE — Telephone Encounter (Signed)
Is in the ER now

## 2022-07-09 NOTE — ED Provider Notes (Signed)
Center EMERGENCY DEPARTMENT Provider Note   CSN: [id number] Arrival date & time: 07/09/22  1047     History  Chief Complaint  Patient presents with   Chest Pain    [name] is a 76 y.o. female.   Chest Pain Associated symptoms: shortness of breath   Patient presents for chest pain.  Medical history includes HLD, MS, osteoporosis, neurogenic bladder, and mitral valve prolapse.  She had anterior chest pain starting 1 week ago.  Onset was while bending down and reaching forward.  Pain has been constant.  It is since migrated to include her left scapula.  It is pleuritic in nature.  She has been taking ibuprofen at home with some relief.  She has not taken anything for pain today.     Home Medications Prior to Admission medications   Medication Sig Start Date End Date Taking? Authorizing Provider  fluconazole (DIFLUCAN) 150 MG tablet Take 1 tablet (150 mg total) by mouth once a week. 09/02/21   Kimberly Carbon, MD  methenamine (HIPREX) 1 g tablet Take 1 tablet (1 g total) by mouth every other day. 07/02/21   Kimberly Carbon, MD      Allergies    Baclofen, Lidocaine, and Procaine hcl    Review of Systems   Review of Systems  Respiratory:  Positive for shortness of breath.   Cardiovascular:  Positive for chest pain.  All other systems reviewed and are negative.   Physical Exam Updated Vital Signs BP 136/74 (BP Location: Right Arm)   Pulse 64   Temp 98 F (36.7 C) (Oral)   Resp 18   Ht 5\' 3"  (1.6 m)   Wt 54.4 kg   SpO2 98%   BMI 21.26 kg/m  Physical Exam Vitals and nursing note reviewed.  Constitutional:      General: She is not in acute distress.    Appearance: She is well-developed and normal weight. She is not ill-appearing, toxic-appearing or diaphoretic.  HENT:     Head: Normocephalic and atraumatic.  Eyes:     Conjunctiva/sclera: Conjunctivae normal.  Neck:     Vascular: No JVD.  Cardiovascular:     Rate and Rhythm:  Normal rate and regular rhythm.     Heart sounds: No murmur heard. Pulmonary:     Effort: Pulmonary effort is normal. No respiratory distress.     Breath sounds: No decreased breath sounds, wheezing, rhonchi or rales.  Chest:     Chest wall: Tenderness present.  Abdominal:     Palpations: Abdomen is soft.     Tenderness: There is no abdominal tenderness.  Musculoskeletal:        General: No swelling.     Cervical back: Normal range of motion and neck supple.     Right lower leg: No edema.     Left lower leg: No edema.  Skin:    General: Skin is warm and dry.     Capillary Refill: Capillary refill takes less than 2 seconds.     Coloration: Skin is not cyanotic or pale.  Neurological:     General: No focal deficit present.     Mental Status: She is alert and oriented to person, place, and time.  Psychiatric:        Mood and Affect: Mood normal.        Behavior: Behavior normal.     ED Results / Procedures / Treatments   Labs (all labs ordered are listed, but only  abnormal results are displayed) Labs Reviewed  CBC - Abnormal; Notable for the following components:      Result Value   WBC 11.2 (*)    Platelets 429 (*)    All other components within normal limits  HEPATIC FUNCTION PANEL - Abnormal; Notable for the following components:   Albumin 3.4 (*)    Bilirubin, Direct 0.3 (*)    All other components within normal limits  BASIC METABOLIC PANEL  LIPASE, BLOOD  TROPONIN I (HIGH SENSITIVITY)  TROPONIN I (HIGH SENSITIVITY)    EKG None  Radiology DG Chest 1 View  Result Date: 07/09/2022 CLINICAL DATA:  Chest pain EXAM: CHEST  1 VIEW COMPARISON:  11/26/2019 FINDINGS: Stable cardiomediastinal contours. Patchy and linear opacities within the right lung base. Left lung appears clear. No pleural effusion or pneumothorax. Reverse right shoulder arthroplasty. IMPRESSION: Patchy and linear opacities within the right lung base, may represent a combination of atelectasis and  pneumonia. Electronically Signed   By: Duanne Guess D.O.   On: 07/09/2022 11:16    Procedures Procedures    Medications Ordered in ED Medications - No data to display  ED Course/ Medical Decision Making/ A&P                           Medical Decision Making Amount and/or Complexity of Data Reviewed Labs: ordered. Radiology: ordered.   This patient presents to the ED for concern of chest pain, this involves an extensive number of treatment options, and is a complaint that carries with it a high risk of complications and morbidity.  The differential diagnosis includes musculoskeletal injury, muscle strain, costochondritis, pneumonia, PE, pericarditis, ACS   Co morbidities that complicate the patient evaluation  HLD, MS, osteoporosis, neurogenic bladder, and mitral valve prolapse   Additional history obtained:  Additional history obtained from N/A External records from outside source obtained and reviewed including EMR   Lab Tests:  I Ordered, and personally interpreted labs.  The pertinent results include: A mild leukocyte is is present.  Hemoglobin, electrolytes, and troponin are normal.   Imaging Studies ordered:  I ordered imaging studies including chest x-ray, CTA chest I independently visualized and interpreted imaging which showed patchy linear opacities on x-ray; CTA of chest is pending at time of signout. I agree with the radiologist interpretation   Cardiac Monitoring: / EKG:  The patient was maintained on a cardiac monitor.  I personally viewed and interpreted the cardiac monitored which showed an underlying rhythm of: Sinus rhythm  Problem List / ED Course / Critical interventions / Medication management  Patient is a 76 year old female presenting for 1 week of atypical chest pain.  She denies any recent injuries.  She feels that the onset was related to an episode where she was bending down reaching forward.  Her pain has been persistent over the past  week.  It is not worsening but it is not getting any better.  She has experienced pleurisy and pain has migrated to the area of her left scapula.  On arrival in the ED, her vital signs are normal.  She is well-appearing on exam.  She does have significant tenderness to her anterior chest.  Her lungs are clear to auscultation and her breathing is unlabored.  SPO2 is normal on room air.  Patient declined any analgesia while in the ED.  Laboratory work-up showed a mild leukocytosis but was otherwise unremarkable.  On x-ray imaging, there were some nonspecific areas  of linear opacities.  A CTA of chest was ordered.  Unfortunately, while undergoing CTA she had infiltration of the iodinated contrast into her left arm.  Ice packs were applied and arm was elevated.  I placed a ultrasound-guided peripheral IV into her right arm.  Plan will be for CTA from newly placed IV.  If negative, I feel that she can continue supportive care at home for musculoskeletal etiology.  Care of patient was signed out to oncoming ED provider.   Social Determinants of Health:  Has access to outpatient care        Final Clinical Impression(s) / ED Diagnoses Final diagnoses:  Pleuritic chest pain    Rx / DC Orders ED Discharge Orders     None         Gloris Manchester, MD 07/09/22 1616

## 2022-07-09 NOTE — Discharge Instructions (Addendum)
Special Instructions  Raise affected area above the heart, as tolerated.  Apply ice packs for 10-20 minutes every hour, 3 times, and as needed after  that as symptoms continue. Do not place ice packs directly on skin. Place a  layer of cloth between the ice pack and the skin. When should I seek urgent medical attention?  Swelling or pain that have gotten worse  Affected area or extremity is blue or cold  Tingling, numbness or loss of sensation in affected body part  Blistering of the skin at the site of the extravasation  Decreasing ability to move affected body part

## 2022-07-09 NOTE — ED Provider Notes (Signed)
  Physical Exam  BP 136/74 (BP Location: Right Arm)   Pulse 64   Temp 98 F (36.7 C) (Oral)   Resp 18   Ht 5\' 3"  (1.6 m)   Wt 54.4 kg   SpO2 98%   BMI 21.26 kg/m   Physical Exam  Procedures  Procedures  ED Course / MDM    Received care of patient from Dr. .  Please see his note for prior history, physical and care Briefly this is a 76 year old female who presents with 1 week of chest pain.  Personally evaluated EKG and labs at this time.  Delta troponins are both negative, have low suspicion for ACS.  No sign of significant anemia, pancreatitis, or transaminitis.  CT PE study pending at time of transfer of care.  CT PE study shows***

## 2022-07-09 NOTE — ED Provider Triage Note (Signed)
Emergency Medicine Provider Triage Evaluation Note  [name] , a 76 y.o. female  was evaluated in triage.  Pt complains of chest wall pain.  Patient states that 1 week ago she reached down her bottom and began experiencing right-sided chest wall pain.  The patient states that this pain is not constant, it comes and goes.  The patient is unsure of the trend or what causes this pain to become worse or better.  The patient states that over the last week the pain has transitioned from the right side of her chest to the left side of her chest now behind her left shoulder.  Patient states the chest pain is reproducible on palpation.  Patient denies history of blood clots.  Patient denies any shortness of breath associated with her chest pain however states that when she tries to take a deep breath then the chest pain causes her to cease inspiration.  Patient denies any fevers, nausea, vomiting, diarrhea, abdominal pain, back pain.  Review of Systems  Positive:  Negative:   Physical Exam  BP (!) 145/71 (BP Location: Left Arm)   Pulse 64   Temp 97.9 F (36.6 C) (Oral)   Resp 15   Ht 5\' 3"  (1.6 m)   SpO2 100%   BMI 20.03 kg/m  Gen:   Awake, no distress   Resp:  Normal effort  MSK:   Moves extremities without difficulty  Other:  Reproducible right-sided chest wall pain  Medical Decision Making  Medically screening exam initiated at 10:55 AM.  Appropriate orders placed.  Alnisa D Winecoff was informed that the remainder of the evaluation will be completed by another provider, this initial triage assessment does not replace that evaluation, and the importance of remaining in the ED until their evaluation is complete.  Work-up initiated   [name], Al Decant 07/09/22 1057

## 2022-07-09 NOTE — Telephone Encounter (Signed)
Noted, agree with plan.

## 2022-07-09 NOTE — Progress Notes (Signed)
  Evaluation after Contrast Extravasation  Patient seen and examined immediately after contrast extravasation while in CT.  Exam: There is mild swelling at the left antecubital area.  There is mild erythema. There is mild discoloration. There is area of mild blistering to area. There are no signs of decreased perfusion of the skin.  It is warm to touch.  The patient has full ROM in fingers.  Radial pulse is normal.  Per contrast extravasation protocol, I have instructed the patient to keep an ice pack on the area for 20-60 minutes at a time for about 48 hours.   Keep arm elevated as much as possible.   The patient understands to call the radiology department if there is: - increase in pain or swelling - changed or altered sensation - ulceration or blistering - increasing redness - warmth or increasing firmness - decreased tissue perfusion as noted by decreased capillary refill or discoloration of skin - decreased pulses peripheral to site   Shon Hough PA-C 07/09/2022 3:03 PM

## 2022-07-09 NOTE — ED Triage Notes (Signed)
Pt bib ems from home c.o sternal chest pain that radiates to her left side that happened suddenly after reaching behind her in bed. Pain worse with deep inspiration and movement.

## 2022-07-28 ENCOUNTER — Telehealth: Payer: Self-pay | Admitting: Internal Medicine

## 2022-07-28 NOTE — Telephone Encounter (Signed)
Patient called in and was wanting to speak with you in regards to going into skilled nursing. She can be reached at [phone]. Thank you!

## 2022-07-28 NOTE — Telephone Encounter (Signed)
Is looking to go into a skilled facility in [place] (near where she grew up and has family). Administrator wants to see last 2 years records--I only have 2 visits in that time. Asked to write and sign a request for this--and have son bring it over (they don't use mychart). Then we can fax ---I can do FL-2 if needed

## 2022-08-04 ENCOUNTER — Telehealth: Payer: Self-pay | Admitting: Internal Medicine

## 2022-08-04 MED ORDER — METHENAMINE HIPPURATE 1 G PO TABS: 1.0000 g | ORAL_TABLET | ORAL | 5 refills | Status: DC

## 2022-08-04 NOTE — Telephone Encounter (Signed)
  Encourage patient to contact the pharmacy for refills or they can request refills through [facility]  Did the patient contact the pharmacy:  no   LAST APPOINTMENT DATE:  Please schedule appointment if longer than 1 year  NEXT APPOINTMENT DATE:  MEDICATION:methenamine (HIPREX) 1 g tablet  Is the patient out of medication? yes  If not, how much is left?  Is this a 90 day supply: no  PHARMACY:[facility] on [address]  Let patient know to contact pharmacy at the end of the day to make sure medication is ready.  Please notify patient to allow 48-72 hours to process  CLINICAL FILLS OUT ALL BELOW:

## 2022-08-05 NOTE — Telephone Encounter (Signed)
Form was brought in and dropped off, placed in [employer].

## 2022-08-06 NOTE — Telephone Encounter (Addendum)
Per note from pt, last 2 OV Notes faxed to Ava Ammons at [facility]. Left message on VM per DPR advising pt they have been sent.

## 2022-08-06 NOTE — Telephone Encounter (Signed)
We did not receive a form from the pt. We received a hand written note form the pt about where to send the information.

## 2022-08-15 ENCOUNTER — Telehealth: Payer: Self-pay | Admitting: Internal Medicine

## 2022-08-15 NOTE — Telephone Encounter (Signed)
Patient called in stating that she needs a  Medicaid Long Term Care SL2 form. It can be faxed over to [phone] to the attention of Lequita Halt. Thank you!

## 2022-08-15 NOTE — Telephone Encounter (Signed)
Called and left message for pt to call the office on Tuesday

## 2022-08-19 NOTE — Telephone Encounter (Signed)
I asked pt questions needed for the FL2 form. Placed form in Dr Karle Starch inbox on his desk.

## 2022-08-19 NOTE — Addendum Note (Signed)
Addended by: Eual Fines on: 08/19/2022 03:10 PM   Modules accepted: Orders

## 2022-08-21 NOTE — Telephone Encounter (Signed)
Left message on VM for pt to let her know it was faxed and about the $29 charge that she will be billed.

## 2022-09-09 ENCOUNTER — Other Ambulatory Visit: Payer: Self-pay | Admitting: Urology

## 2022-09-17 ENCOUNTER — Telehealth: Payer: Self-pay | Admitting: Internal Medicine

## 2022-09-17 NOTE — Telephone Encounter (Signed)
Patient called back in and informed her you spoke with son. She stated she isn't sure about a SNF at the moment. Informed her that RX was sent over to pharmacy.

## 2022-09-17 NOTE — Telephone Encounter (Signed)
Checked with pt's son to see if she was at [facility]. He said she is still at home and Dr Silvio Pate is still PCP.

## 2022-09-18 NOTE — Telephone Encounter (Signed)
Pt called stating someone contacted her son while he was working, pt requested not to contact son for her continued care issues. Pt also requested not to contact her for issue either & that'd she contact us when she hears something.

## 2022-09-23 NOTE — Patient Instructions (Signed)
SURGICAL WAITING ROOM VISITATION Patients having surgery or a procedure may have no more than 2 support people in the waiting area - these visitors may rotate.   Children under the age of 76 must have an adult with them who is not the patient. If the patient needs to stay at the hospital during part of their recovery, the visitor guidelines for inpatient rooms apply. Pre-op nurse will coordinate an appropriate time for 1 support person to accompany patient in pre-op.  This support person may not rotate.    Please refer to the [facility] website for the visitor guidelines for Inpatients (after your surgery is over and you are in a regular room).       Your procedure is scheduled on:  10/07/22    Report to [facility] Main Entrance    Report to admitting at   0700AM   Call this number if you have problems the morning of surgery [phone]   Do not eat food :After Midnight.   After Midnight you may have the following liquids until _ 0600_____ AM DAY OF SURGERY  Water Non-Citrus Juices (without pulp, NO RED) Carbonated Beverages Black Coffee (NO MILK/CREAM OR CREAMERS, sugar ok)  Clear Tea (NO MILK/CREAM OR CREAMERS, sugar ok) regular and decaf                             Plain Jell-O (NO RED)                                           Fruit ices (not with fruit pulp, NO RED)                                     Popsicles (NO RED)                                                               Sports drinks like Gatorade (NO RED)                           If you have questions, please contact your surgeon's office.       Oral Hygiene is also important to reduce your risk of infection.                                    Remember - BRUSH YOUR TEETH THE MORNING OF SURGERY WITH YOUR REGULAR TOOTHPASTE   Do NOT smoke after Midnight   Take these medicines the morning of surgery with A SIP OF WATER:  none   DO NOT TAKE ANY ORAL DIABETIC MEDICATIONS DAY OF YOUR  SURGERY  Bring CPAP mask and tubing day of surgery.                              You may not have any metal on your body including hair pins, jewelry, and body piercing  Do not wear make-up, lotions, powders, perfumes/cologne, or deodorant  Do not wear nail polish including gel and S&S, artificial/acrylic nails, or any other type of covering on natural nails including finger and toenails. If you have artificial nails, gel coating, etc. that needs to be removed by a nail salon please have this removed prior to surgery or surgery may need to be canceled/ delayed if the surgeon/ anesthesia feels like they are unable to be safely monitored.   Do not shave  48 hours prior to surgery.               Men may shave face and neck.   Do not bring valuables to the hospital. [place].   Contacts, dentures or bridgework may not be worn into surgery.   Bring small overnight bag day of surgery.   DO NOT [place]. PHARMACY WILL DISPENSE MEDICATIONS LISTED ON YOUR MEDICATION LIST TO YOU DURING YOUR ADMISSION [place]!    Patients discharged on the day of surgery will not be allowed to drive home.  Someone NEEDS to stay with you for the first 24 hours after anesthesia.   Special Instructions: Bring a copy of your healthcare power of attorney and living will documents the day of surgery if you haven't scanned them before.              Please read over the following fact sheets you were given: IF [place] [phone]   If you received a COVID test during your pre-op visit  it is requested that you wear a mask when out in public, stay away from anyone that may not be feeling well and notify your surgeon if you develop symptoms. If you test positive for Covid or have been in contact with anyone that has tested positive in the last 10 days please notify  you surgeon.     [place] - Preparing for Surgery Before surgery, you can play an important role.  Because skin is not sterile, your skin needs to be as free of germs as possible.  You can reduce the number of germs on your skin by washing with CHG (chlorahexidine gluconate) soap before surgery.  CHG is an antiseptic cleaner which kills germs and bonds with the skin to continue killing germs even after washing. Please DO NOT use if you have an allergy to CHG or antibacterial soaps.  If your skin becomes reddened/irritated stop using the CHG and inform your nurse when you arrive at Short Stay. Do not shave (including legs and underarms) for at least 48 hours prior to the first CHG shower.  You may shave your face/neck. Please follow these instructions carefully:  1.  Shower with CHG Soap the night before surgery and the  morning of Surgery.  2.  If you choose to wash your hair, wash your hair first as usual with your  normal  shampoo.  3.  After you shampoo, rinse your hair and body thoroughly to remove the  shampoo.                           4.  Use CHG as you would any other liquid soap.  You can apply chg directly  to the skin and wash  Gently with a scrungie or clean washcloth.  5.  Apply the CHG Soap to your body ONLY FROM THE NECK DOWN.   Do not use on face/ open                           Wound or open sores. Avoid contact with eyes, ears mouth and genitals (private parts).                       Wash face,  Genitals (private parts) with your normal soap.             6.  Wash thoroughly, paying special attention to the area where your surgery  will be performed.  7.  Thoroughly rinse your body with warm water from the neck down.  8.  DO NOT shower/wash with your normal soap after using and rinsing off  the CHG Soap.                9.  Pat yourself dry with a clean towel.            10.  Wear clean pajamas.            11.  Place clean sheets on your bed the night of your  first shower and do not  sleep with pets. Day of Surgery : Do not apply any lotions/deodorants the morning of surgery.  Please wear clean clothes to the hospital/surgery center.  FAILURE TO FOLLOW THESE INSTRUCTIONS MAY RESULT IN THE CANCELLATION OF YOUR SURGERY PATIENT SIGNATURE_________________________________  NURSE SIGNATURE__________________________________  ________________________________________________________________________

## 2022-09-23 NOTE — Progress Notes (Signed)
Anesthesia Review:  PCP: Cardiologist : Chest x-ray : 1view- 07/09/22  CT angio chest- 07/10/22  EKG : 07/10/22  Echo : Stress test: Cardiac Cath :  Activity level:  Sleep Study/ CPAP : Fasting Blood Sugar :      / Checks Blood Sugar -- times a day:   Blood Thinner/ Instructions /Last Dose: ASA / Instructions/ Last Dose :   325 mg Aspirin

## 2022-09-24 ENCOUNTER — Encounter (HOSPITAL_COMMUNITY)
Admission: RE | Admit: 2022-09-24 | Discharge: 2022-09-24 | Disposition: A | Discharge status: Home / Self Care | Payer: Medicare Other | Source: Ambulatory Visit | Attending: Urology | Admitting: Urology

## 2022-09-24 ENCOUNTER — Other Ambulatory Visit: Payer: Self-pay

## 2022-09-24 ENCOUNTER — Encounter (HOSPITAL_COMMUNITY): Payer: Self-pay

## 2022-09-24 VITALS — BP 136/80 | HR 64 | Temp 98.1°F | Resp 16 | Ht 63.0 in

## 2022-09-24 DIAGNOSIS — Z01818 Encounter for other preprocedural examination: Secondary | ICD-10-CM

## 2022-09-24 DIAGNOSIS — Z01812 Encounter for preprocedural laboratory examination: Secondary | ICD-10-CM | POA: Diagnosis present

## 2022-09-24 HISTORY — DX: Anemia, unspecified: D64.9

## 2022-09-24 HISTORY — DX: Multiple sclerosis: G35

## 2022-09-24 HISTORY — DX: Personal history of urinary calculi: Z87.442

## 2022-09-24 LAB — BASIC METABOLIC PANEL
Anion gap: 6 (ref 5–15)
BUN: 18 mg/dL (ref 8–23)
CO2: 27 mmol/L (ref 22–32)
Calcium: 9.4 mg/dL (ref 8.9–10.3)
Chloride: 108 mmol/L (ref 98–111)
Creatinine, Ser: 0.47 mg/dL (ref 0.44–1.00)
GFR, Estimated: >60 mL/min (ref >60)
Glucose, Bld: 97 mg/dL (ref 70–99)
Potassium: 4.7 mmol/L (ref 3.5–5.1)
Sodium: 141 mmol/L (ref 135–145)

## 2022-09-24 LAB — CBC
HCT: 44.5 % (ref 36.0–46.0)
Hemoglobin: 14 g/dL (ref 12.0–15.0)
MCH: 30.1 pg (ref 26.0–34.0)
MCHC: 31.5 g/dL (ref 30.0–36.0)
MCV: 95.7 fL (ref 80.0–100.0)
Platelets: 438 10*3/uL — ABNORMAL HIGH (ref 150–400)
RBC: 4.65 MIL/uL (ref 3.87–5.11)
RDW: 12.2 % (ref 11.5–15.5)
WBC: 10.8 10*3/uL — ABNORMAL HIGH (ref 4.0–10.5)
nRBC: 0 % (ref 0.0–0.2)

## 2022-09-25 ENCOUNTER — Telehealth: Payer: Self-pay

## 2022-09-25 NOTE — Telephone Encounter (Signed)
Per triage note patient has appointment to be seen with urology next week. Declined making appointment with PCP office will call if any change in symptoms or would like to make appointment.

## 2022-09-25 NOTE — Telephone Encounter (Signed)
Noted  

## 2022-09-25 NOTE — Telephone Encounter (Signed)
[facility] Day - Client TELEPHONE ADVICE RECORD AccessNurse Patient Name: [name] [facility] Gender: Female DOB: 10-Oct-1946 Age: 76 Y 5 M 21 D Return Phone Number: [phone] (Primary) Address: [address] City/ State/ Zip: [place]  [address] Client [facility] Day - Client Client Site [facility] - Day Provider Tillman Abide- MD Contact Type Call Who Is Calling Patient / Member / Family / Caregiver Call Type Triage / Clinical Relationship To Patient Self Return Phone Number [phone] (Primary) Chief Complaint Vaginal Discharge Reason for Call Symptomatic / Request for Health Information Initial Comment Caller states she is having an operation on 24th of this month but when she lays down at night she has a clear yellow discharge in her diaper. She wants to know if she has an infection. Additional Comment Caller was transferred from the office for triage. Translation No Nurse Assessment Nurse: Debroah Loop, RN, Heather Date/Time (Eastern Time): 09/25/2022 9:01:13 AM Confirm and document reason for call. If symptomatic, describe symptoms. ---caller states when she lays down she is having a yellow discharge in her depends when she wakes up. Having a kidney stone procedure on 10/24. Denies fever. Denies pain or burning with urination. Discharge started about a week ago. Only has discharge when she saturates the depends. Does the patient have any new or worsening symptoms? ---Yes Will a triage be completed? ---Yes Related visit to physician within the last 2 weeks? ---No Does the PT have any chronic conditions? (i.e. diabetes, asthma, this includes High risk factors for pregnancy, etc.) ---Yes List chronic conditions. ---MS, kidney stones Is this a behavioral health or substance abuse call? ---No Guidelines Guideline Title Affirmed Question Affirmed Notes Nurse Date/Time  Lamount Cohen Time) Urinary Symptoms Side (flank) or lower back pain present Jola Babinski 09/25/2022 9:05:32 AM PLEASE NOTE: All timestamps contained within this report are represented as [place] Standard Time. CONFIDENTIALTY NOTICE: This fax transmission is intended only for the addressee. It contains information that is legally privileged, confidential or otherwise protected from use or disclosure. If you are not the intended recipient, you are strictly prohibited from reviewing, disclosing, copying using or disseminating any of this information or taking any action in reliance on or regarding this information. If you have received this fax in error, please notify us immediately by telephone so that we can arrange for its return to [place]. Phone: [phone], Toll-Free: [phone], Fax: [phone] Page: 2 of 2 Call Id: [id number] Disp. Time Lamount Cohen Time) Disposition Final User 09/25/2022 9:09:17 AM See PCP within 24 Hours Yes Debroah Loop RN, Herbert Seta Final Disposition 09/25/2022 9:09:17 AM See PCP within 24 Hours Yes Debroah Loop, RN, Herbert Seta Caller Disagree/Comply Comply Caller Understands Yes PreDisposition Call Doctor Care Advice Given Per Guideline SEE PCP WITHIN 24 HOURS: * IF OFFICE WILL BE OPEN: You need to be examined within the next 24 hours. Call your doctor (or NP/PA) when the office opens and make an appointment. CALL BACK IF: * Fever occurs * Unable to urinate and bladder feels full * You become worse CARE ADVICE given per Urinary Symptoms (Adult) guideline. Comments User: Lyman Speller, RN Date/Time Lamount Cohen Time): 09/25/2022 9:04:53 AM catheterized and checked for infection about 2-3 weeks ago User: Lyman Speller, RN Date/Time Lamount Cohen Time): 09/25/2022 9:09:12 AM was seen in hospital yesterday but urine test was not done. User: Lyman Speller, RN Date/Time Lamount Cohen Time): 09/25/2022 9:13:50 AM caller has urology appt on Monday. States she may wait until then to be seen.  Agrees to call back or be seen in any symptoms worsen in the meantime. User: Mauri Pole, RN Date/Time Eilene Ghazi Time): 09/25/2022 9:17:46 AM office made aware that patient is refusing to be seen Referrals REFERRED TO PCP OFFICE

## 2022-10-06 NOTE — Anesthesia Preprocedure Evaluation (Addendum)
Anesthesia Evaluation  Patient identified by MRN, date of birth, ID band Patient awake    Reviewed: Allergy & Precautions, NPO status , Patient's Chart, lab work & pertinent test results  History of Anesthesia Complications Negative for: history of anesthetic complications  Airway Mallampati: II  TM Distance: >3 FB Neck ROM: Full    Dental  (+) Missing,    Pulmonary neg pulmonary ROS,    Pulmonary exam normal        Cardiovascular negative cardio ROS Normal cardiovascular exam     Neuro/Psych Multiple sclerosis (uses wheelchair) negative psych ROS   GI/Hepatic negative GI ROS, Neg liver ROS,   Endo/Other  negative endocrine ROS  Renal/GU nephrolithiasis  negative genitourinary   Musculoskeletal  (+) Arthritis ,   Abdominal   Peds  Hematology negative hematology ROS (+)   Anesthesia Other Findings Day of surgery medications reviewed with patient.  Reproductive/Obstetrics negative OB ROS                           Anesthesia Physical Anesthesia Plan  ASA: 2  Anesthesia Plan: General   Post-op Pain Management: Tylenol PO (pre-op)*   Induction: Intravenous  PONV Risk Score and Plan: 3 and Treatment may vary due to age or medical condition, Ondansetron, Dexamethasone and Propofol infusion  Airway Management Planned: LMA  Additional Equipment: None  Intra-op Plan:   Post-operative Plan: Extubation in OR  Informed Consent: I have reviewed the patients History and Physical, chart, labs and discussed the procedure including the risks, benefits and alternatives for the proposed anesthesia with the patient or authorized representative who has indicated his/her understanding and acceptance.     Dental advisory given  Plan Discussed with: CRNA  Anesthesia Plan Comments: (Discussed listed lidocaine allergy with patient. She received IV lidocaine for her TSA in 2021 and had no  complications. We agreed that we should remove this from her allergy list. Daiva Huge, MD)      Anesthesia Quick Evaluation

## 2022-10-06 NOTE — H&P (Signed)
I have kidney stones.  HPI: [name] is a 76 year-old female established patient who is here for renal calculi.    [name] is a 76 yo female who saw Dr. Sherron Monday on 8/31 for right hydro noted on a CT chest. She cancelled the CT that was ordered by Dr. Sherron Monday because she didn't want dye. She has known stones and had been seen by Dr. Marlou Porch in the past. She has some pain on the right side when she gets rolled over. She has MS but voids normally. she has had no hematuria or dysuria but hasn't had a UA in 2 years.      ALLERGIES: Contrast Dye Lidocaine HCl (PF) SOLN - Other Reaction, "had to be walked X 1 hr" Novocain SOLN - Skin Rash    MEDICATIONS: Methenamine Hippurate 1 gram tablet 1 tablet PO Daily     GU PSH: Cystoscopy - 2017 Cystoscopy Ureteroscopy - 1969 Hysterectomy Unilat SO - 2007 Locm 300-399Mg /Ml Iodine,1Ml - 2018       PSH Notes: partial kidney blockage    NON-GU PSH: Back Surgery (Unspecified) Leg surgery (unspecified) Nasal Sinus Therapy Shoulder Surgery (Unspecified) Tonsillectomy..     GU PMH: Chronic cystitis (w/o hematuria) - 08/14/2022, - 2018, - 2017, Chronic cystitis, - 2016 Hydronephrosis - 08/14/2022 Renal calculus - 2021, - 2019, - 2018, - 2018, - 2017, Nephrolithiasis, - 2016 Gross hematuria - 2017, (Acute), Culture urine pending from 12/03/16 drop off specimen. Will empirically SMZ-TMP DS 1 po BID X 7 days till culture complete. Will proceed with hematuria workup with CT and cysto. Pt refused foley and bladder irrigation and refuses to schedule CT until after cysto and OV w/Dr. [name], - 2017, Gross hematuria, - 2014    NON-GU PMH: Encounter for general adult medical examination without abnormal findings, Encounter for preventive health examination - 2016 Unspecified atrial fibrillation, Atrial Fibrillation - 2014 Arthritis Cardiac murmur, unspecified    FAMILY HISTORY: 1 - Son, Daughter Deceased - Father, Mother Gall stones -  Father Kidney Stones - Father nephrolithiasis - Father stroke - Father, Mother   SOCIAL HISTORY: Marital Status: Widowed Preferred Language: English; Ethnicity: Not Hispanic Or Latino; Race: White Current Smoking Status: Patient has never smoked.  Does not use smokeless tobacco. Has never drank.  Does not drink caffeine.    REVIEW OF SYSTEMS:    GU Review Female:   Patient reports leakage of urine. Patient denies frequent urination, hard to postpone urination, burning /pain with urination, get up at night to urinate, stream starts and stops, trouble starting your stream, have to strain to urinate, and being pregnant.  Gastrointestinal (Upper):   Patient denies nausea, vomiting, and indigestion/ heartburn.  Gastrointestinal (Lower):   Patient denies diarrhea and constipation.  Constitutional:   Patient denies fever, night sweats, weight loss, and fatigue.  Skin:   Patient denies skin rash/ lesion and itching.  Eyes:   Patient denies blurred vision and double vision.  Ears/ Nose/ Throat:   Patient denies sore throat and sinus problems.  Hematologic/Lymphatic:   Patient denies swollen glands and easy bruising.  Cardiovascular:   Patient denies leg swelling and chest pains.  Respiratory:   Patient denies shortness of breath and cough.  Endocrine:   Patient denies excessive thirst.  Musculoskeletal:   Patient denies back pain and joint pain.  Neurological:   Patient reports headaches. Patient denies dizziness.  Psychologic:   Patient denies depression and anxiety.   VITAL SIGNS: None   MULTI-SYSTEM PHYSICAL EXAMINATION:  Constitutional: Well-nourished. Normally developed. Good grooming. In wheelchair.   Respiratory: No labored breathing, no use of accessory muscles.   Cardiovascular: Normal temperature, normal extremity pulses, no swelling, no varicosities.  Gastrointestinal: Abdominal tenderness, mild RLQ. No mass, no rigidity, non obese abdomen.      Complexity of Data:  Records  Review:   Previous Patient Records  Urine Test Review:   Urinalysis  X-Ray Review: C.T. Chest: Reviewed Films. Reviewed Report. Discussed With Patient.  C.T. Stone Protocol: Reviewed Films. Discussed With Patient. 2 6-31mm right distal ureteral stones with some residual ureteral and renal dilation.     PROCEDURES:         C.T. Urogram - [id number]  There are 2 6-44mm stones in the right distal ureter with mild/moderate residual obstruction but less than on the Chest CT.       Patient confirmed No Neulasta OnPro Device.        In and Out Catheterization - 51701  A 14 French red rubber or straight catheter was inserted into the bladder using sterile technique. 200 cc of urine was obtained.. A Urinalysis Auto W/O Scope was sent to the lab. A urine culture was sent to the lab.         Urinalysis w/Scope Dipstick Dipstick Cont'd Micro  Color: Yellow Bilirubin: Neg mg/dL WBC/hpf: 0 - 5/hpf  Appearance: Slightly Cloudy Ketones: Neg mg/dL RBC/hpf: NS (Not Seen)  Specific Gravity: 1.020 Blood: Neg ery/uL Bacteria: Rare (0-9/hpf)  pH: 7.5 Protein: Neg mg/dL Cystals: NS (Not Seen)  Glucose: Neg mg/dL Urobilinogen: 0.2 mg/dL Casts: NS (Not Seen)    Nitrites: Neg Trichomonas: Not Present    Leukocyte Esterase: Neg leu/uL Mucous: Not Present      Epithelial Cells: NS (Not Seen)      Yeast: NS (Not Seen)      Sperm: Not Present    ASSESSMENT:      ICD-10 Details  1 GU:   Ureteral calculus - N20.1 Undiagnosed New Problem - She has 2 6-60mm stones in the right distal ureter with mild/mod hydroureteronephrosis. I discussed options for management including observation, URS and ESWL and will get her set up for URS with an overnight stay since she doesn't have 24 hour caregivers. I have reviewed the risks of ureteroscopy including bleeding, infection, ureteral injury, need for a stent or secondary procedures, thrombotic events and anesthetic complications.    2   Ureteral obstruction secondary to calculous -  N13.2 Undiagnosed New Problem   PLAN:           Orders Labs CULTURE, URINE, Urinalysis w/Scope  X-Rays: C.T. Stone Protocol Without I.V. Contrast  X-Ray Notes:   History:  Hematuria: Yes/No  Patient to see MD after exam: Yes/No  Previous exam: CT / IVP/ US/ KUB/ None  When:  Where:  Diabetic: Yes/ No  BUN/ Creatinine:  Date of last BUN Creatinine:  Weight in pounds:  Allergy- IV Contrast: Yes/ No  Conflicting diabetic meds: Yes/ No  Diabetic Meds:  Prior Authorization #: [id number]            Schedule Return Visit/Planned Activity: Next Available Appointment - Schedule Surgery  Procedure: Unspecified Date - Cysto Uretero Lithotripsy - [phone], right Notes: next available.

## 2022-10-07 ENCOUNTER — Encounter (HOSPITAL_COMMUNITY)
Admission: RE | Disposition: A | Discharge status: Home / Self Care | Payer: Self-pay | Source: Ambulatory Visit | Attending: Urology

## 2022-10-07 ENCOUNTER — Ambulatory Visit (HOSPITAL_COMMUNITY): Payer: Medicare Other

## 2022-10-07 ENCOUNTER — Ambulatory Visit (HOSPITAL_BASED_OUTPATIENT_CLINIC_OR_DEPARTMENT_OTHER): Payer: Medicare Other | Admitting: Certified Registered"

## 2022-10-07 ENCOUNTER — Ambulatory Visit (HOSPITAL_COMMUNITY): Payer: Medicare Other | Admitting: Physician Assistant

## 2022-10-07 ENCOUNTER — Other Ambulatory Visit: Payer: Self-pay

## 2022-10-07 ENCOUNTER — Observation Stay (HOSPITAL_COMMUNITY)
Admission: RE | Admit: 2022-10-07 | Discharge: 2022-10-08 | Disposition: A | Discharge status: Home / Self Care | Payer: Medicare Other | Source: Ambulatory Visit | Attending: Urology | Admitting: Urology

## 2022-10-07 ENCOUNTER — Encounter (HOSPITAL_COMMUNITY): Payer: Self-pay | Admitting: Urology

## 2022-10-07 DIAGNOSIS — N201 Calculus of ureter: Secondary | ICD-10-CM | POA: Diagnosis present

## 2022-10-07 DIAGNOSIS — I4891 Unspecified atrial fibrillation: Secondary | ICD-10-CM | POA: Insufficient documentation

## 2022-10-07 DIAGNOSIS — N390 Urinary tract infection, site not specified: Secondary | ICD-10-CM | POA: Diagnosis not present

## 2022-10-07 DIAGNOSIS — Z79899 Other long term (current) drug therapy: Secondary | ICD-10-CM | POA: Diagnosis not present

## 2022-10-07 DIAGNOSIS — N3941 Urge incontinence: Secondary | ICD-10-CM | POA: Diagnosis not present

## 2022-10-07 DIAGNOSIS — Z01818 Encounter for other preprocedural examination: Secondary | ICD-10-CM

## 2022-10-07 HISTORY — PX: CYSTOSCOPY/URETEROSCOPY/HOLMIUM LASER/STENT PLACEMENT: SHX6546

## 2022-10-07 SURGERY — CYSTOSCOPY/URETEROSCOPY/HOLMIUM LASER/STENT PLACEMENT
Anesthesia: General | Site: Ureter | Laterality: Right

## 2022-10-07 MED ORDER — EPHEDRINE SULFATE-NACL 50-0.9 MG/10ML-% IV SOSY
PREFILLED_SYRINGE | INTRAVENOUS | Status: DC | PRN
  Administered 2022-10-07 (×2): 5 mg via INTRAVENOUS
  Administered 2022-10-07 (×3): 10 mg via INTRAVENOUS

## 2022-10-07 MED ORDER — PROPOFOL 10 MG/ML IV BOLUS
INTRAVENOUS | Status: DC | PRN
  Administered 2022-10-07: 100 mg via INTRAVENOUS

## 2022-10-07 MED ORDER — SENNOSIDES-DOCUSATE SODIUM 8.6-50 MG PO TABS: 1.0000 | ORAL_TABLET | Freq: Every evening | ORAL | Status: DC | PRN

## 2022-10-07 MED ORDER — CEFAZOLIN SODIUM-DEXTROSE 2-4 GM/100ML-% IV SOLN
2.0000 g | INTRAVENOUS | Status: AC
  Administered 2022-10-07: 2 g via INTRAVENOUS
  Filled 2022-10-07: qty 100

## 2022-10-07 MED ORDER — ACETAMINOPHEN 325 MG PO TABS: 650.0000 mg | ORAL_TABLET | ORAL | Status: DC | PRN

## 2022-10-07 MED ORDER — FLEET ENEMA 7-19 GM/118ML RE ENEM: 1.0000 | ENEMA | Freq: Once | RECTAL | Status: DC | PRN

## 2022-10-07 MED ORDER — 0.9 % SODIUM CHLORIDE (POUR BTL) OPTIME
TOPICAL | Status: DC | PRN
  Administered 2022-10-07: 1000 mL

## 2022-10-07 MED ORDER — CHLORHEXIDINE GLUCONATE 0.12 % MT SOLN
15.0000 mL | Freq: Once | OROMUCOSAL | Status: AC
  Administered 2022-10-07: 15 mL via OROMUCOSAL

## 2022-10-07 MED ORDER — ONDANSETRON HCL 4 MG/2ML IJ SOLN: 4.0000 mg | INTRAMUSCULAR | Status: DC | PRN

## 2022-10-07 MED ORDER — FENTANYL CITRATE (PF) 100 MCG/2ML IJ SOLN
INTRAMUSCULAR | Status: DC | PRN
  Administered 2022-10-07: 25 ug via INTRAVENOUS

## 2022-10-07 MED ORDER — SODIUM CHLORIDE 0.9 % IR SOLN
Status: DC | PRN
  Administered 2022-10-07: 3000 mL

## 2022-10-07 MED ORDER — OXYCODONE HCL 5 MG PO TABS: 5.0000 mg | ORAL_TABLET | Freq: Four times a day (QID) | ORAL | 0 refills | Status: DC | PRN

## 2022-10-07 MED ORDER — PROPOFOL 500 MG/50ML IV EMUL
INTRAVENOUS | Status: AC
  Filled 2022-10-07: qty 50

## 2022-10-07 MED ORDER — PROPOFOL 500 MG/50ML IV EMUL
INTRAVENOUS | Status: DC | PRN
  Administered 2022-10-07: 25 ug/kg/min via INTRAVENOUS

## 2022-10-07 MED ORDER — EPHEDRINE 5 MG/ML INJ
INTRAVENOUS | Status: AC
  Filled 2022-10-07: qty 15

## 2022-10-07 MED ORDER — POTASSIUM CHLORIDE IN NACL 20-0.45 MEQ/L-% IV SOLN
INTRAVENOUS | Status: DC
  Filled 2022-10-07 (×2): qty 1000

## 2022-10-07 MED ORDER — ONDANSETRON HCL 4 MG/2ML IJ SOLN
INTRAMUSCULAR | Status: DC | PRN
  Administered 2022-10-07: 4 mg via INTRAVENOUS

## 2022-10-07 MED ORDER — ACETAMINOPHEN 500 MG PO TABS
1000.0000 mg | ORAL_TABLET | Freq: Once | ORAL | Status: AC
  Administered 2022-10-07: 1000 mg via ORAL
  Filled 2022-10-07: qty 2

## 2022-10-07 MED ORDER — LACTATED RINGERS IV SOLN: INTRAVENOUS | Status: DC

## 2022-10-07 MED ORDER — LIDOCAINE 2% (20 MG/ML) 5 ML SYRINGE
INTRAMUSCULAR | Status: DC | PRN
  Administered 2022-10-07: 40 mg via INTRAVENOUS

## 2022-10-07 MED ORDER — ONDANSETRON HCL 4 MG/2ML IJ SOLN
INTRAMUSCULAR | Status: AC
  Filled 2022-10-07: qty 2

## 2022-10-07 MED ORDER — BISACODYL 10 MG RE SUPP: 10.0000 mg | Freq: Every day | RECTAL | Status: DC | PRN

## 2022-10-07 MED ORDER — ORAL CARE MOUTH RINSE: 15.0000 mL | Freq: Once | OROMUCOSAL | Status: AC

## 2022-10-07 MED ORDER — FENTANYL CITRATE (PF) 100 MCG/2ML IJ SOLN
INTRAMUSCULAR | Status: AC
  Filled 2022-10-07: qty 2

## 2022-10-07 MED ORDER — PROPOFOL 10 MG/ML IV BOLUS
INTRAVENOUS | Status: AC
  Filled 2022-10-07: qty 20

## 2022-10-07 MED ORDER — DEXAMETHASONE SODIUM PHOSPHATE 10 MG/ML IJ SOLN
INTRAMUSCULAR | Status: AC
  Filled 2022-10-07: qty 1

## 2022-10-07 MED ORDER — DEXAMETHASONE SODIUM PHOSPHATE 10 MG/ML IJ SOLN
INTRAMUSCULAR | Status: DC | PRN
  Administered 2022-10-07: 4 mg via INTRAVENOUS

## 2022-10-07 MED ORDER — OXYCODONE HCL 5 MG PO TABS: 5.0000 mg | ORAL_TABLET | ORAL | Status: DC | PRN

## 2022-10-07 MED ORDER — HYDROMORPHONE HCL 1 MG/ML IJ SOLN: 0.5000 mg | INTRAMUSCULAR | Status: DC | PRN

## 2022-10-07 MED ORDER — CEFAZOLIN SODIUM-DEXTROSE 1-4 GM/50ML-% IV SOLN
1.0000 g | Freq: Three times a day (TID) | INTRAVENOUS | Status: DC
  Administered 2022-10-07 – 2022-10-08 (×3): 1 g via INTRAVENOUS
  Filled 2022-10-07 (×4): qty 50

## 2022-10-07 SURGICAL SUPPLY — 23 items
BAG URO CATCHER STRL LF (MISCELLANEOUS) ×2 IMPLANT
BASKET STONE NCOMPASS (UROLOGICAL SUPPLIES) IMPLANT
CATH URETERAL DUAL LUMEN 10F (MISCELLANEOUS) IMPLANT
CATH URETL OPEN 5X70 (CATHETERS) IMPLANT
CLOTH BEACON ORANGE TIMEOUT ST (SAFETY) ×2 IMPLANT
EXTRACTOR STONE NITINOL NGAGE (UROLOGICAL SUPPLIES) IMPLANT
GLOVE SURG SS PI 8.0 STRL IVOR (GLOVE) ×2 IMPLANT
GOWN STRL REUS W/ TWL XL LVL3 (GOWN DISPOSABLE) ×2 IMPLANT
GOWN STRL REUS W/TWL XL LVL3 (GOWN DISPOSABLE) ×1
GUIDEWIRE STR DUAL SENSOR (WIRE) ×2 IMPLANT
IV NS IRRIG 3000ML ARTHROMATIC (IV SOLUTION) ×2 IMPLANT
KIT TURNOVER KIT A (KITS) IMPLANT
LASER FIB FLEXIVA PULSE ID 365 (Laser) IMPLANT
LASER FIB FLEXIVA PULSE ID 550 (Laser) IMPLANT
LASER FIB FLEXIVA PULSE ID 910 (Laser) IMPLANT
MANIFOLD NEPTUNE II (INSTRUMENTS) ×2 IMPLANT
PACK CYSTO (CUSTOM PROCEDURE TRAY) ×2 IMPLANT
SHEATH NAVIGATOR HD 11/13X36 (SHEATH) IMPLANT
STENT URET 6FRX22 CONTOUR (STENTS) IMPLANT
TRACTIP FLEXIVA PULS ID 200XHI (Laser) IMPLANT
TRACTIP FLEXIVA PULSE ID 200 (Laser)
TUBING CONNECTING 10 (TUBING) ×2 IMPLANT
TUBING UROLOGY SET (TUBING) ×2 IMPLANT

## 2022-10-07 NOTE — Interval H&P Note (Signed)
History and Physical Interval Note:  KUB today shows no change in the right distal stone.   10/07/2022 9:21 AM  [name]  has presented today for surgery, with the diagnosis of RIGHT DISTAL STONES.  The various methods of treatment have been discussed with the patient and family. After consideration of risks, benefits and other options for treatment, the patient has consented to  Procedure(s) with comments: CYSTOSCOPY RIGHT RETROGRADE RIGHT URETEROSCOPY/HOLMIUM LASER/STENT PLACEMENT (Right) - 1 HR FOR CASE as a surgical intervention.  The patient's history has been reviewed, patient examined, no change in status, stable for surgery.  I have reviewed the patient's chart and labs.  Questions were answered to the patient's satisfaction.     Irine Seal

## 2022-10-07 NOTE — Anesthesia Postprocedure Evaluation (Signed)
Anesthesia Post Note  Patient: [name]  Procedure(s) Performed: CYSTOSCOPY RIGHT RETROGRADE RIGHT URETEROSCOPY/HOLMIUM LASER/STENT PLACEMENT (Right: Ureter)     Patient location during evaluation: PACU Anesthesia Type: General Level of consciousness: awake and alert Pain management: pain level controlled Vital Signs Assessment: post-procedure vital signs reviewed and stable Respiratory status: spontaneous breathing, nonlabored ventilation and respiratory function stable Cardiovascular status: blood pressure returned to baseline Postop Assessment: no apparent nausea or vomiting Anesthetic complications: no   No notable events documented.  Last Vitals:  Vitals:   10/07/22 0715 10/07/22 1025  BP: 132/78 133/68  Pulse: (!) 58 75  Resp: 16 17  Temp: 36.5 C   SpO2: 97% 100%    Last Pain:  Vitals:   10/07/22 1025  TempSrc:   PainSc: 0-No pain                 Marthenia Rolling

## 2022-10-07 NOTE — Discharge Instructions (Signed)
You may remove the ureteral stent by pulling the attached string that is coming out of your urethra on Monday 10/30 am.  If you don't feel you can do that, we can remove it at your follow up visit on 11/6.   I have taken your stone fragments to the office for analysis.

## 2022-10-07 NOTE — Anesthesia Procedure Notes (Signed)
Procedure Name: LMA Insertion Date/Time: 10/07/2022 9:38 AM  Performed by: Eben Burow, CRNAPre-anesthesia Checklist: Patient identified, Emergency Drugs available, Suction available, Patient being monitored and Timeout performed Patient Re-evaluated:Patient Re-evaluated prior to induction Oxygen Delivery Method: Circle system utilized Preoxygenation: Pre-oxygenation with 100% oxygen Induction Type: IV induction Ventilation: Mask ventilation without difficulty LMA: LMA inserted LMA Size: 4.0 Number of attempts: 1 Tube secured with: Tape Dental Injury: Teeth and Oropharynx as per pre-operative assessment

## 2022-10-07 NOTE — Transfer of Care (Signed)
Immediate Anesthesia Transfer of Care Note  Patient: [name]  Procedure(s) Performed: CYSTOSCOPY RIGHT RETROGRADE RIGHT URETEROSCOPY/HOLMIUM LASER/STENT PLACEMENT (Right: Ureter)  Patient Location: PACU  Anesthesia Type:General  Level of Consciousness: awake, alert  and patient cooperative  Airway & Oxygen Therapy: Patient Spontanous Breathing and Patient connected to face mask oxygen  Post-op Assessment: Report given to RN and Post -op Vital signs reviewed and stable  Post vital signs: Reviewed and stable  Last Vitals:  Vitals Value Taken Time  BP 133/68   Temp    Pulse 75 10/07/22 1026  Resp 15 10/07/22 1026  SpO2 100 % 10/07/22 1026  Vitals shown include unvalidated device data.  Last Pain:  Vitals:   10/07/22 0719  TempSrc:   PainSc: 0-No pain         Complications: No notable events documented.

## 2022-10-07 NOTE — Op Note (Signed)
Procedure: 1 cystoscopy with right ureteroscopy with holmium laser application, stone extraction and insertion of right double-J stent. 2.  Application of fluoroscopy.  Preop diagnosis: Right distal ureteral stone.  Postop diagnosis: Same.  Surgeon: Dr. Irine Seal.  Anesthesia: General.  Specimen: Stone fragments.  Drains: 6 French by 22 cm right contour double-J stent with tether.  EBL: None.  Complications: None.  Indications: The patient is a 76 year old female who has a 5 x 7 mm right distal ureteral stone that is failed to pass and she is to undergo ureteroscopy.  Procedure: She was taken operating room where she was given 2 g of Ancef.  A general anesthetic was induced.  She was placed in lithotomy position and fitted with PAS hose.  Her perineum and genitalia were prepped Betadine solution she was draped in usual sterile fashion.  Cystoscopy was performed using the 21 [place] scope and 30 degree lens.  Examination revealed a normal urethra.  The bladder wall was smooth and pale with some squamous metaplasia of the trigone but no tumors, stones or inflammation.  Ureteral orifices were unremarkable.  The right ureteral orifice was then cannulated with a 6.5 French dual-lumen semirigid ureteroscope which was easily advanced to the level of the stone which had some edema at the site of the stone.  A 365 m holmium laser fiber was then passed through the scope and the stone was fragmented using the dusting setting of 0.3 J and 60 Hz.  Once the stone was adequately fragmented, the fragments were removed to the bladder with an engage basket.  Final ureteroscopic and fluoroscopic assessment revealed no residual fragments.  The ureteroscope was removed and the cystoscope was replaced.  The bladder was evacuated free of stone fragments and then a sensor wire was passed to the kidney under fluoroscopic guidance.  A 6 French by 22 cm contour double-J stent was then passed the kidney under  fluoroscopic guidance.  The tether was knotted close to the urethral meatus, trimmed to an appropriate length and then tucked vaginally.  She was taken down from lithotomy position, her anesthetic was reversed and she was moved to recovery in stable condition.  There were no complications.  The stone fragments to be taken the office for analysis.

## 2022-10-07 NOTE — Interval H&P Note (Signed)
History and Physical Interval Note:  10/07/2022 9:03 AM  [name]  has presented today for surgery, with the diagnosis of RIGHT DISTAL STONES.  The various methods of treatment have been discussed with the patient and family. After consideration of risks, benefits and other options for treatment, the patient has consented to  Procedure(s) with comments: CYSTOSCOPY RIGHT RETROGRADE RIGHT URETEROSCOPY/HOLMIUM LASER/STENT PLACEMENT (Right) - 1 HR FOR CASE as a surgical intervention.  The patient's history has been reviewed, patient examined, no change in status, stable for surgery.  I have reviewed the patient's chart and labs.  Questions were answered to the patient's satisfaction.     Irine Seal

## 2022-10-08 ENCOUNTER — Encounter (HOSPITAL_COMMUNITY): Payer: Self-pay | Admitting: Urology

## 2022-10-08 DIAGNOSIS — N201 Calculus of ureter: Secondary | ICD-10-CM | POA: Diagnosis not present

## 2022-10-08 DIAGNOSIS — N3941 Urge incontinence: Secondary | ICD-10-CM | POA: Insufficient documentation

## 2022-10-08 NOTE — Progress Notes (Signed)
  Transition of Care (TOC) Screening Note   Patient Details  Name: [name] Date of Birth: 01/25/1946   Transition of Care [facility]) CM/SW Contact:    Roseanne Kaufman, RN Phone Number: 10/08/2022, 9:39 AM    Transition of Care Department [facility]) has reviewed patient and no TOC needs have been identified at this time. We will continue to monitor patient advancement through interdisciplinary progression rounds. If new patient transition needs arise, please place a TOC consult.

## 2022-10-08 NOTE — Plan of Care (Signed)

## 2022-10-08 NOTE — Progress Notes (Signed)
Pt discharged home today per Dr. Jeffie Pollock. Pt's IV site D/C'd and WDL. Pt's VSS. Pt provided with home medication list, discharge instructions and prescriptions. Verbalized understanding. Pt left floor via WC in stable condition accompanied by NT.

## 2022-10-08 NOTE — Discharge Summary (Signed)
Physician Discharge Summary  Patient ID: [name] MRN: [id number] DOB/AGE: Apr 27, 1946 76 y.o.  Admit date: 10/07/2022 Discharge date: 10/08/2022  Admission Diagnoses:  Right ureteral stone  Discharge Diagnoses:  Principal Problem:   Right ureteral stone Active Problems:   Recurrent UTI   Urge incontinence   Past Medical History:  Diagnosis Date   Anemia    Ankle fracture, left 07/15/2006   Ankle fracture, right 10/30/2006   [facility]   Compression fracture 12/15/2009   Dr. Mardelle Matte    Disc herniation    L5, S1   History of kidney stones    MS (multiple sclerosis) ([place])    evaluated by Dr. Erling Cruz   Multiple sclerosis [facility])    MVP (mitral valve prolapse) 12/15/1977   ECHO. Trace MR, trace TR, trace PR 07/17/03   Nephrolithiasis    Osteoporosis    Rib fractures 1998, 2003,2004   2   Toe fracture 12/15/1996   5    Surgeries: Procedure(s): CYSTOSCOPY RIGHT RETROGRADE RIGHT URETEROSCOPY/HOLMIUM LASER/STENT PLACEMENT on 10/07/2022   Consultants (if any):   Discharged Condition: Improved  Hospital Course: [name] is an 76 y.o. female who was admitted 10/07/2022 with a diagnosis of Right ureteral stone and went to the operating room on 10/07/2022 and underwent the above named procedures.  She did well post op and was ready for D/C on 10/25.   She was given perioperative antibiotics:  Anti-infectives (From admission, onward)    Start     Dose/Rate Route Frequency Ordered Stop   10/07/22 1400  ceFAZolin (ANCEF) IVPB 1 g/50 mL premix        1 g 100 mL/hr over 30 Minutes Intravenous Every 8 hours 10/07/22 1021 10/14/22 1359   10/07/22 0711  ceFAZolin (ANCEF) IVPB 2g/100 mL premix        2 g 200 mL/hr over 30 Minutes Intravenous 30 min pre-op 10/07/22 0711 10/07/22 0952     .  She was given sequential compression devices for DVT prophylaxis.  She benefited maximally from the hospital stay and there were no complications.    Recent vital signs:  Vitals:    10/08/22 0100 10/08/22 0614  BP: (!) 104/54 102/63  Pulse: 93 84  Resp: 19 19  Temp: 98.2 F (36.8 C) 98.1 F (36.7 C)  SpO2: 94% 95%    Recent laboratory studies:  Lab Results  Component Value Date   HGB 14.0 09/24/2022   HGB 14.5 07/09/2022   HGB 10.7 (L) 10/19/2020   Lab Results  Component Value Date   WBC 10.8 (H) 09/24/2022   PLT 438 (H) 09/24/2022   Lab Results  Component Value Date   INR 1.0 10/12/2020   Lab Results  Component Value Date   NA 141 09/24/2022   K 4.7 09/24/2022   CL 108 09/24/2022   CO2 27 09/24/2022   BUN 18 09/24/2022   CREATININE 0.47 09/24/2022   GLUCOSE 97 09/24/2022    Discharge Medications:   Allergies as of 10/08/2022       Reactions   Baclofen Diarrhea, Nausea And Vomiting   Iodinated Contrast Media Hives   Procaine Hcl    UNSPECIFIED        Medication List     TAKE these medications    aspirin EC 325 MG tablet Take 325 mg by mouth daily as needed (headaches).   methenamine 1 g tablet Commonly known as: HIPREX TAKE 1 TABLET BY MOUTH EVERY OTHER DAY   oxyCODONE 5 MG immediate  release tablet Commonly known as: Roxicodone Take 1 tablet (5 mg total) by mouth every 6 (six) hours as needed for moderate pain or severe pain.        Diagnostic Studies: DG C-Arm 1-60 Min-No Report  Result Date: 10/07/2022 Fluoroscopy was utilized by the requesting physician.  No radiographic interpretation.   DG Abd 1 View  Result Date: 10/07/2022 CLINICAL DATA:  Right ureteral stone EXAM: ABDOMEN - 1 VIEW COMPARISON:  CT done on 09/08/2022 FINDINGS: Bowel gas pattern is nonspecific. Small to moderate amount of stool is seen in colon. There is 8 mm calcific density in right upper quadrant consistent with gallbladder stones seen in the previous CT. There are 2 calcific densities in right side of pelvis each measuring less than 7 mm. In the previous CT, there was a calculus in the distal course of right ureter. IMPRESSION: There are  calcific densities in right side of pelvis, possibly ureteral calculi in the distal course of right ureter. There is calcified gallbladder stone. Bowel gas pattern is nonspecific. Electronically Signed   By: Elmer Picker M.D.   On: 10/07/2022 09:26    Disposition: Discharge disposition: 01-Home or Self Care       Discharge Instructions     Discharge patient   Complete by: As directed    Discharge disposition: 01-Home or Self Care   Discharge patient date: 10/08/2022   Discontinue IV   Complete by: As directed         Follow-up Information     Hollace Hayward, NP Follow up on 10/20/2022.   Why: 10am Contact information: [address] [place] [address] [phone]                  Signed: Irine Seal 10/08/2022, 7:56 AM

## 2022-10-08 NOTE — Plan of Care (Signed)
Problem: Education: Goal: Knowledge of General Education information will improve Description: Including pain rating scale, medication(s)/side effects and non-pharmacologic comfort measures 10/08/2022 1016 by Annie Sable, RN Outcome: Adequate for Discharge 10/08/2022 0837 by Annie Sable, RN Outcome: Adequate for Discharge   Problem: Health Behavior/Discharge Planning: Goal: Ability to manage health-related needs will improve 10/08/2022 1016 by Annie Sable, RN Outcome: Adequate for Discharge 10/08/2022 0837 by Annie Sable, RN Outcome: Adequate for Discharge   Problem: Clinical Measurements: Goal: Ability to maintain clinical measurements within normal limits will improve 10/08/2022 1016 by Annie Sable, RN Outcome: Adequate for Discharge 10/08/2022 0837 by Annie Sable, RN Outcome: Adequate for Discharge Goal: Will remain free from infection 10/08/2022 1016 by Annie Sable, RN Outcome: Adequate for Discharge 10/08/2022 0837 by Annie Sable, RN Outcome: Adequate for Discharge Goal: Diagnostic test results will improve 10/08/2022 1016 by Annie Sable, RN Outcome: Adequate for Discharge 10/08/2022 0837 by Annie Sable, RN Outcome: Adequate for Discharge Goal: Respiratory complications will improve 10/08/2022 1016 by Annie Sable, RN Outcome: Adequate for Discharge 10/08/2022 0837 by Annie Sable, RN Outcome: Adequate for Discharge Goal: Cardiovascular complication will be avoided 10/08/2022 1016 by Annie Sable, RN Outcome: Adequate for Discharge 10/08/2022 9678 by Annie Sable, RN Outcome: Adequate for Discharge   Problem: Activity: Goal: Risk for activity intolerance will decrease 10/08/2022 1016 by Annie Sable, RN Outcome: Adequate for Discharge 10/08/2022 0837 by Annie Sable, RN Outcome: Adequate for Discharge   Problem: Nutrition: Goal: Adequate nutrition will be maintained 10/08/2022 1016 by Annie Sable,  RN Outcome: Adequate for Discharge 10/08/2022 0837 by Annie Sable, RN Outcome: Adequate for Discharge   Problem: Coping: Goal: Level of anxiety will decrease 10/08/2022 1016 by Annie Sable, RN Outcome: Adequate for Discharge 10/08/2022 0837 by Annie Sable, RN Outcome: Adequate for Discharge   Problem: Elimination: Goal: Will not experience complications related to bowel motility 10/08/2022 1016 by Annie Sable, RN Outcome: Adequate for Discharge 10/08/2022 0837 by Annie Sable, RN Outcome: Adequate for Discharge Goal: Will not experience complications related to urinary retention 10/08/2022 1016 by Annie Sable, RN Outcome: Adequate for Discharge 10/08/2022 0837 by Annie Sable, RN Outcome: Adequate for Discharge   Problem: Pain Managment: Goal: General experience of comfort will improve 10/08/2022 1016 by Annie Sable, RN Outcome: Adequate for Discharge 10/08/2022 0837 by Annie Sable, RN Outcome: Adequate for Discharge   Problem: Safety: Goal: Ability to remain free from injury will improve 10/08/2022 1016 by Annie Sable, RN Outcome: Adequate for Discharge 10/08/2022 0837 by Annie Sable, RN Outcome: Adequate for Discharge   Problem: Skin Integrity: Goal: Risk for impaired skin integrity will decrease 10/08/2022 1016 by Annie Sable, RN Outcome: Adequate for Discharge 10/08/2022 0837 by Annie Sable, RN Outcome: Adequate for Discharge   Problem: Education: Goal: Required Educational Video(s) 10/08/2022 1016 by Annie Sable, RN Outcome: Adequate for Discharge 10/08/2022 0837 by Annie Sable, RN Outcome: Adequate for Discharge   Problem: Clinical Measurements: Goal: Postoperative complications will be avoided or minimized 10/08/2022 1016 by Annie Sable, RN Outcome: Adequate for Discharge 10/08/2022 0837 by Annie Sable, RN Outcome: Adequate for Discharge   Problem: Skin Integrity: Goal: Demonstration  of wound healing without infection will improve 10/08/2022 1016 by Annie Sable, RN Outcome: Adequate for Discharge 10/08/2022 0837 by Annie Sable, RN Outcome: Adequate for Discharge

## 2022-10-15 ENCOUNTER — Telehealth: Payer: Self-pay

## 2022-10-15 NOTE — Progress Notes (Addendum)
    Chronic Care Management Pharmacy Assistant   Name: [name]  MRN: [id number] DOB: 12-11-1946  Reason for Encounter: Non-CCM (Hosptial Follow Up)  Medications: Outpatient Encounter Medications as of 10/15/2022  Medication Sig   aspirin EC 325 MG tablet Take 325 mg by mouth daily as needed (headaches).   methenamine (HIPREX) 1 g tablet TAKE 1 TABLET BY MOUTH EVERY OTHER DAY   oxyCODONE (ROXICODONE) 5 MG immediate release tablet Take 1 tablet (5 mg total) by mouth every 6 (six) hours as needed for moderate pain or severe pain.   No facility-administered encounter medications on file as of 10/15/2022.   Reviewed hospital notes for details of recent visit. Has patient been contacted by Transitions of Care team? No Has patient seen PCP/specialist for hospital follow up (summarize OV if yes): No  Admitted to the hospital on 10/07/2022. Discharge date was 10/08/2022.  Discharged from [facility].   Discharge diagnosis (Principal Problem): CYSTOSCOPY RIGHT RETROGRADE RIGHT URETEROSCOPY/HOLMIUM LASER/STENT PLACEMENT  Patient was discharged to Home  Brief summary of hospital course: [name] is an 76 y.o. female who was admitted 10/07/2022 with a diagnosis of Right ureteral stone and went to the operating room on 10/07/2022 and underwent the above named procedures.  She did well post op and was ready for D/C on 10/25.   New?Medications Started at [facility] Discharge:?? -Started oxyCODONE (ROXICODONE) 5 MG immediate release tablet   Medications that remain the same after Hospital Discharge:??  -All other medications will remain the same.    Next CCM appt: Non-CCM  Other upcoming appts: No appointments scheduled within the next 30 days.  Charlene Brooke, PharmD notified and will determine if action is needed.  Charlene Brooke, CPP notified  Marijean Niemann, [place] Clinical Pharmacy Assistant [phone]   Pharmacist addendum: This was a planned admission for  cystoscopy. No further action needed.  Charlene Brooke, PharmD, BCACP 11/05/22 12:05 PM

## 2022-10-25 ENCOUNTER — Other Ambulatory Visit: Payer: Self-pay | Admitting: Adult Health

## 2022-10-25 DIAGNOSIS — N132 Hydronephrosis with renal and ureteral calculous obstruction: Secondary | ICD-10-CM

## 2022-10-29 ENCOUNTER — Other Ambulatory Visit (HOSPITAL_COMMUNITY): Payer: Self-pay | Admitting: Adult Health

## 2022-10-29 DIAGNOSIS — N132 Hydronephrosis with renal and ureteral calculous obstruction: Secondary | ICD-10-CM

## 2022-10-31 ENCOUNTER — Telehealth: Payer: Self-pay | Admitting: Internal Medicine

## 2022-10-31 NOTE — Telephone Encounter (Signed)
Patient called in and stated that a form will be sent over today or Monday for Dr. Alphonsus Sias to fill out. Thank you!

## 2022-10-31 NOTE — Telephone Encounter (Signed)
Patient called back and said the form is an FL2 from [place].

## 2022-10-31 NOTE — Telephone Encounter (Signed)
I do not see it in my [place]. Maybe it will arrive Monday.

## 2022-10-31 NOTE — Telephone Encounter (Signed)
The first number left for the pt was actually her son's number. He was not sure what she was needing. So, I called the other number in her chart. She is not in a facility, et. Said she was  not sure what they were sending. Possibly an FL2. I advised her if they were wanting other information, we have not seen her in office in over 1.5 ears and virtually in 1 year. Will wait to see what it is we will be getting.

## 2022-11-04 NOTE — Telephone Encounter (Signed)
Spoke to pt. Advised her we have not received anything for her as of right now. Dr Alphonsus Sias is out of the office now.

## 2022-11-04 NOTE — Telephone Encounter (Signed)
Pt called wants to know status of form that was sent to PCP would like a call back .unsure if it was mail or Fax  Please advise # [phone]

## 2022-11-10 NOTE — Telephone Encounter (Signed)
Pt called asking for status on the FL2 form? Call back # [phone]

## 2022-11-13 NOTE — Telephone Encounter (Signed)
[place] is caught up and nothing has come through for her. Not sure if you still looking for this.

## 2022-12-01 ENCOUNTER — Ambulatory Visit (HOSPITAL_COMMUNITY): Payer: Medicare Other

## 2022-12-01 ENCOUNTER — Ambulatory Visit (HOSPITAL_COMMUNITY)
Admission: RE | Admit: 2022-12-01 | Discharge: 2022-12-01 | Disposition: A | Discharge status: Home / Self Care | Payer: Medicare Other | Source: Ambulatory Visit | Attending: Adult Health | Admitting: Adult Health

## 2022-12-01 ENCOUNTER — Other Ambulatory Visit: Payer: Medicare Other

## 2022-12-01 DIAGNOSIS — N132 Hydronephrosis with renal and ureteral calculous obstruction: Secondary | ICD-10-CM | POA: Insufficient documentation

## 2023-01-12 ENCOUNTER — Telehealth: Payer: Self-pay | Admitting: Internal Medicine

## 2023-01-12 NOTE — Telephone Encounter (Signed)
Patient called in and stated that [facility] needs a [place] FL2 form sent over to them in regards to the patient. She stated that it can be faxed over to [phone]. Thank you!

## 2023-01-13 NOTE — Telephone Encounter (Signed)
Will need to be able to do video if we do virtual.

## 2023-01-13 NOTE — Telephone Encounter (Signed)
Spoke with patient son, he will let her know to give [place] a call.

## 2023-01-29 ENCOUNTER — Encounter: Payer: Medicare Other | Admitting: Internal Medicine

## 2023-02-10 ENCOUNTER — Telehealth: Payer: Self-pay | Admitting: *Deleted

## 2023-02-10 ENCOUNTER — Encounter: Payer: Self-pay | Admitting: Internal Medicine

## 2023-02-10 ENCOUNTER — Ambulatory Visit (INDEPENDENT_AMBULATORY_CARE_PROVIDER_SITE_OTHER): Payer: Medicare Other | Admitting: Internal Medicine

## 2023-02-10 VITALS — BP 118/70 | HR 96 | Temp 97.1°F

## 2023-02-10 DIAGNOSIS — G35 Multiple sclerosis: Secondary | ICD-10-CM

## 2023-02-10 DIAGNOSIS — N390 Urinary tract infection, site not specified: Secondary | ICD-10-CM | POA: Diagnosis not present

## 2023-02-10 DIAGNOSIS — G8220 Paraplegia, unspecified: Secondary | ICD-10-CM

## 2023-02-10 DIAGNOSIS — Z0000 Encounter for general adult medical examination without abnormal findings: Secondary | ICD-10-CM | POA: Diagnosis not present

## 2023-02-10 DIAGNOSIS — K592 Neurogenic bowel, not elsewhere classified: Secondary | ICD-10-CM | POA: Diagnosis not present

## 2023-02-10 NOTE — Assessment & Plan Note (Signed)
Doing okay without laxatinves

## 2023-02-10 NOTE — Progress Notes (Signed)
Subjective:    Patient ID: [name], female    DOB: Feb 12, 1946, 77 y.o.   MRN: [id number]  HPI Here for Medicare wellness visit and follow up of chronic health conditions Reviewed advanced directives Reviewed other doctors----Dr Wrenn--urology, Dr Sater--neurology (hasn't seen), Dentist (name?) Had kidney stone removed via cysto and stent---10/23 No alcohol or tobacco Not able to exercise Vision okay--reading glasses---no doctor Hearing is not bad No falls No depression or anhedonia Some recall issues--nothing worrisome  Still in own home---lives alone but son stays at night at times Son does the food shopping Can't straighten either leg---since she had fracture and right shoulder repair Has aide 10-3PM and 4-10PM Needs Harrel Lemon for transfers Aides help with dressing  Incontinent of bowel and bladder Sponge bath only Independent moving in motorized chair--can get around her place (like to get food) Has looked into SNF[place]  No bed sores now Did get left leg caught in wheelchair again-now internally rotated some again Some pain if lying on it  Reviewed MRIs---still has multiple sclerosis lesions--but no Rx Hasn't seen neurologist in a while Ongoing paraplegia---she notes worsening trouble due to contractures in legs  Current Outpatient Medications on File Prior to Visit  Medication Sig Dispense Refill   methenamine (HIPREX) 1 g tablet TAKE 1 TABLET BY MOUTH EVERY OTHER DAY 30 tablet 6   No current facility-administered medications on file prior to visit.    Allergies  Allergen Reactions   Baclofen Diarrhea and Nausea And Vomiting   Iodinated Contrast Media Hives   Procaine Hcl     UNSPECIFIED    Past Medical History:  Diagnosis Date   Anemia    Ankle fracture, left 07/15/2006   Ankle fracture, right 10/30/2006   [facility]   Compression fracture 12/15/2009   Dr. Mardelle Matte    Disc herniation    L5, S1   History of kidney  stones    MS (multiple sclerosis) ([place])    evaluated by Dr. Erling Cruz   Multiple sclerosis [facility])    MVP (mitral valve prolapse) 12/15/1977   ECHO. Trace MR, trace TR, trace PR 07/17/03   Nephrolithiasis    Osteoporosis    Rib fractures 1998, [id number]   2   Toe fracture 12/15/1996   5    Past Surgical History:  Procedure Laterality Date   CYSTOSCOPY/URETEROSCOPY/HOLMIUM LASER/STENT PLACEMENT Right 10/07/2022   Procedure: CYSTOSCOPY RIGHT RETROGRADE RIGHT URETEROSCOPY/HOLMIUM LASER/STENT PLACEMENT;  Surgeon: Irine Seal, MD;  Location: [facility];  Service: Urology;  Laterality: Right;  1 HR FOR CASE   LUMBAR DISC SURGERY  04/15/2007   L5, S1- [facility]   OPEN REDUCTION INTERNAL FIXATION (ORIF) TIBIA/FIBULA FRACTURE Left 10/13/2020   Procedure: OPEN REDUCTION INTERNAL FIXATION (ORIF) TIBIA/FIBULA FRACTURE;  Surgeon: Shona Needles, MD;  Location: [place];  Service: Orthopedics;  Laterality: Left;   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      [place] Right 10/15/2020   Procedure: REVERSE SHOULDER ARTHROPLASTY;  Surgeon: Hiram Gash, MD;  Location: [facility];  Service: Orthopedics;  Laterality: Right;   SEPTOPLASTY  12/15/1981   deviated septum   TONSILLECTOMY AND ADENOIDECTOMY  12/15/1958   TOTAL ABDOMINAL HYSTERECTOMY W/ BILATERAL SALPINGOOPHORECTOMY  12/15/1980   for endometriosis    Family History  Problem Relation Age of Onset   Stroke Mother    Neuropathy Brother    Stroke Father    Hypertension Neg Hx    Diabetes Neg  Hx    Depression Neg Hx    Alcohol abuse Neg Hx    Drug abuse Neg Hx     Social History   Socioeconomic History   Marital status: Widowed    Spouse name: Not on file   Number of children: 2   Years of education: HS   Highest education level: Not on file  Occupational History   Occupation: Secretarial work---disabled  Tobacco Use   Smoking status: Never   Smokeless tobacco: Never  Vaping Use   Vaping Use: Never used   Substance and Sexual Activity   Alcohol use: No   Drug use: No   Sexual activity: Not on file  Other Topics Concern   Not on file  Social History Narrative   Widowed 53.  Lives alone.    Played piano.       No living will   Son [name] should make decisions for her prn   Would accept resuscitation attempts   No tube feeds if cognitively unaware      Caffeine [id number] decaf   Right handed    Social Determinants of Health   Financial Resource Strain: Not on file  Food Insecurity: No Food Insecurity (10/08/2022)   Hunger Vital Sign    Worried About Running Out of Food in the Last Year: Never true    Ran Out of Food in the Last Year: Never true  Transportation Needs: No Transportation Needs (10/08/2022)   PRAPARE - Hydrologist (Medical): No    Lack of Transportation (Non-Medical): No  Physical Activity: Not on file  Stress: Not on file  Social Connections: Not on file  Intimate Partner Violence: Not At Risk (10/08/2022)   Humiliation, Afraid, Rape, and Kick questionnaire    Fear of Current or Ex-Partner: No    Emotionally Abused: No    Physically Abused: No    Sexually Abused: No   Review of Systems Appetite is okay Weight is up some recently Not a great sleeper--but satisfied Has restraint system for van Teeth okay--sees dentist No suspicious skin lesions No chest pain or SOB No dizziness or syncope Feet may get puffy---better after in bed Bowels move okay without meds Voids okay    Objective:   Physical Exam Constitutional:      Appearance: Normal appearance.  HENT:     Mouth/Throat:     Pharynx: No oropharyngeal exudate or posterior oropharyngeal erythema.  Eyes:     Conjunctiva/sclera: Conjunctivae normal.     Pupils: Pupils are equal, round, and reactive to light.  Cardiovascular:     Rate and Rhythm: Normal rate and regular rhythm.     Heart sounds: No murmur heard.    No gallop.     Comments: Feet cool--faint pulse  on left, absent on right Pulmonary:     Effort: Pulmonary effort is normal.     Breath sounds: Normal breath sounds. No wheezing or rales.  Abdominal:     Palpations: Abdomen is soft.     Tenderness: There is no abdominal tenderness.  Musculoskeletal:     Cervical back: Neck supple.     Right lower leg: No edema.     Left lower leg: No edema.  Lymphadenopathy:     Cervical: No cervical adenopathy.  Skin:    Comments: No foot lesions  Neurological:     Mental Status: She is alert and oriented to person, place, and time.     Comments: Word naming--- 5 quickly  then gave up Recall 3/3 No increased tone but contractures in knees No leg movement  Psychiatric:        Mood and Affect: Mood normal.        Behavior: Behavior normal.     Comments: Frustrated but not really depressed            Assessment & Plan:

## 2023-02-10 NOTE — Assessment & Plan Note (Signed)
Doing okay with the methenamine

## 2023-02-10 NOTE — Assessment & Plan Note (Signed)
I have personally reviewed the Medicare Annual Wellness questionnaire and have noted 1. The patient's medical and social history 2. Their use of alcohol, tobacco or illicit drugs 3. Their current medications and supplements 4. The patient's functional ability including ADL's, fall risks, home safety risks and hearing or visual             impairment. 5. Diet and physical activities 6. Evidence for depression or mood disorders  The patients weight, height, BMI and visual acuity have been recorded in the chart I have made referrals, counseling and provided education to the patient based review of the above and I have provided the pt with a written personalized care plan for preventive services.  I have provided you with a copy of your personalized plan for preventive services. Please take the time to review along with your updated medication list.  Prefers no cancer screening Doesn't want any vaccines Not able to exercise

## 2023-02-10 NOTE — Progress Notes (Unsigned)
  Care Coordination  Outreach Note  02/10/2023 Name: [name] MRN: [id number] DOB: Jan 03, 1946   Care Coordination Outreach Attempts: An unsuccessful telephone outreach was attempted today to offer the patient information about available care coordination services as a benefit of their health plan.   Referral received   Follow Up Plan:  Additional outreach attempts will be made to offer the patient care coordination information and services.   Encounter Outcome:  No Answer  Julian Hy, [place] Direct Dial: [phone]

## 2023-02-10 NOTE — Assessment & Plan Note (Signed)
Needs care----hoyer for transfers, help with ADLs (other than eating and some personal care) Will consult CCM---really needs to find a SNF for ongoing care

## 2023-02-10 NOTE — Progress Notes (Signed)
Vision Screening   Right eye Left eye Both eyes  Without correction '20/25 20/25 20/25 '$  With correction     Hearing Screening - Comments:: Did not pass whisper test

## 2023-02-10 NOTE — Assessment & Plan Note (Signed)
Still with MRI lesions but hasn't had Rx

## 2023-02-11 NOTE — Progress Notes (Signed)
  Care Coordination   Note   02/11/2023 Name: [name] MRN: [id number] DOB: [birth year]  [name] is a 77 y.o. year old female who sees Venia Carbon, MD for primary care. I reached out to Mervyn Skeeters by phone today to offer care coordination services.  [name] was given information about Care Coordination services today including:   The Care Coordination services include support from the care team which includes your Nurse Coordinator, Clinical Social Worker, or Pharmacist.  The Care Coordination team is here to help remove barriers to the health concerns and goals most important to you. Care Coordination services are voluntary, and the patient may decline or stop services at any time by request to their care team member.   Care Coordination Consent Status: Patient agreed to services and verbal consent obtained.   Follow up plan:  Telephone appointment with care coordination team member scheduled for:  02/17/2023  Encounter Outcome:  Pt. Scheduled from referral   Julian Hy, [place] Direct Dial: [phone]

## 2023-02-17 ENCOUNTER — Ambulatory Visit: Payer: Self-pay | Admitting: Licensed Clinical Social Worker

## 2023-02-17 ENCOUNTER — Telehealth: Payer: Self-pay | Admitting: Internal Medicine

## 2023-02-17 NOTE — Patient Instructions (Signed)
Visit Information  Thank you for taking time to visit with me today. Please don't hesitate to contact me if I can be of assistance to you.   Following are the goals we discussed today:   Goals Addressed             This Visit's Progress    Placement at [facility]       Activities and task to complete in order to accomplish goals.   Call or go to Department of Social Services to inquire about Medicaid for skilled nursing Call facility in [place] to follow up on price and availability   You stated you will discuss FL2 with PCP and facility in [place] next appointment is by telephone on 02/26/23 at 3:00  Please call the care guide team at [phone] if you need to cancel or reschedule your appointment.    The patient verbalized understanding of instructions, educational materials, and care plan provided today and DECLINED offer to receive copy of patient instructions, educational materials, and care plan.   Casimer Lanius, [place] [phone]

## 2023-02-17 NOTE — Telephone Encounter (Signed)
Patient  FL2 form sent  [place]  [phone] Administrator Ava Ammons [phone] Fax  Attn: Lilia Pro  Patient wants to know if we have the form already, Living center says they faxed it to [place] Patient asks that we only speak to her about this, no one else in the home  Calling around 3pm is best

## 2023-02-17 NOTE — Patient Outreach (Signed)
  Care Coordination  Initial Visit Note   02/17/2023 Name: [name] MRN: [id number] DOB: 1946-10-29  [name] is a 77 y.o. year old female who sees Venia Carbon, MD for primary care. I spoke with  Mervyn Skeeters by phone today.  What matters to the patients health and wellness today?  Facility placement  Patient currently has aides in her home 8 to 3 and 4 to 10 each day.  Son stays with her at night when he can.  She is getting close to running out of money and will no longer be able to afford private pay for her aides.  PCP recommended skilled nursing. Patient does not want anyone to know at this time that she is going to transition to a facility. She wants to keep it private.   Patient will contact a facility from her home town and move forward with action steps listed below.   She will provide LCSW with an update at f/u call next week    Goals Addressed             This Visit's Progress    Placement at [facility] and task to complete in order to accomplish goals.   Call or go to Department of Social Services to inquire about Medicaid for skilled nursing Call facility in [place] to follow up on price and availability   You stated you will discuss FL2 with PCP and facility in [place]        SDOH assessments and interventions completed:  Yes  SDOH Interventions Today    Flowsheet Row Most Recent Value  SDOH Interventions   Financial Strain Interventions Intervention Not Indicated  Stress Interventions Intervention Not Indicated       Care Coordination Interventions:  Yes, provided  Interventions Today    Flowsheet Row Most Recent Value  Chronic Disease   Chronic disease during today's visit Other  [Multiple Sclerosis]  General Interventions   General Interventions Discussed/Reviewed General Interventions Discussed, Level of Care  [Reviewed placement process/  declined list of facilities]  Level of Care Assisted  Living, [place]  Applications Medicaid, FL-2  [Reviewed process and provided education]  Education Interventions   Applications Medicaid, [place]-2  [Reviewed process and provided education]       Follow up plan: Follow up call scheduled for 02/26/23 at 3:00 with Chrystal, LCSW    Encounter Outcome:  Pt. Visit Completed   [name], [place] [phone]

## 2023-02-26 ENCOUNTER — Ambulatory Visit: Payer: Self-pay | Admitting: *Deleted

## 2023-02-26 NOTE — Patient Instructions (Signed)
Visit Information  Thank you for taking time to visit with me today. Please don't hesitate to contact me if I can be of assistance to you.   Following are the goals we discussed today:   Goals Addressed             This Visit's Progress    Placement at [place]       Activities and task to complete in order to accomplish goals.   Call or go to Department of Social Services to inquire about Medicaid for skilled nursing Call facility in [place] to follow up on price and availability-schedule tour-per patient, son will tour facility and report back to her regarding suitability    Confirmed that FL2 has been completed and faxed to the facility in [place] Confirmed that if the facility in [place] is not appropriate, she will look into local facilities in the [place] area         If you are experiencing a [place] or [place] or need someone to talk to, please call 911   Patient verbalizes understanding of instructions and care plan provided today and agrees to view in [place]. Active MyChart status and patient understanding of how to access instructions and care plan via MyChart confirmed with patient.     No further follow up required: patient will contact this Education officer, museum with any additional facility placement needs  Elliot Gurney, [place] Worker  [facility] Care Management [phone]

## 2023-02-26 NOTE — Patient Outreach (Signed)
  Care Coordination   Follow Up Visit Note   02/26/2023 Name: [name] MRN: [id number] DOB: [birth year]  [name] is a 77 y.o. year old female who sees Venia Carbon, MD for primary care. I spoke with  Mervyn Skeeters by phone today.  What matters to the patients health and wellness today?  Facility Placement. Confirmed that patient's provider has faxed over the [place] to the facility in [place]. Patient has not presented to the Department of Social Services to apply for special assistance Medicaid. Plan now is for patient's son to visit the facility in [place] to ensure suitability. Per patient, if facility in [place] does not work out, she will search for a facility locally. Once a final decision is made, she will call this social worker back with any support needs.    Goals Addressed             This Visit's Progress    Placement at [facility] [place]       Activities and task to complete in order to accomplish goals.   Call or go to Department of Social Services to inquire about Medicaid for skilled nursing Call facility in [place] to follow up on price and availability-schedule tour-per patient, son will tour facility and report back to her regarding suitability    Confirmed that FL2 has been completed and faxed to the facility in [place] Confirmed that if the facility in [place] is not appropriate, she will look into local facilities in the [place] area        SDOH assessments and interventions completed:  No     Care Coordination Interventions:  Yes, provided  Interventions Today    Flowsheet Row Most Recent Value  Chronic Disease   Chronic disease during today's visit Other  [Multiple sclerosis]  General Interventions   General Interventions Discussed/Reviewed General Interventions Reviewed, Level of Care  Level of Care Assisted Living, [place] --  [Fl2 completed by MD and faxed to facility in [place],  [place] process discussed, patient encouraged to work with the [place]  regarding special assistance medicaid]  Education Interventions   Applications --  [Fl2 completed by MD and faxed to facility in [place], [place] process discussed, patient encouraged to work with the [place]  regarding special assistance medicaid]       Follow up plan: No further intervention required.  Patient will call this social worker when final decision is made regarding chosen facility.  Encounter Outcome:  Pt. Visit Completed

## 2023-03-05 NOTE — Telephone Encounter (Signed)
Son called in stating that [facility] received everything except the progress notes.They only received half,the other half they did not get. Would like for it to be resent.

## 2023-03-05 NOTE — Telephone Encounter (Signed)
I called [name] and advised him we have faxed that to them 3 times in the last week. Each time they say it was something wrong on our end. I think it is something on their end. I told him I would fax it, again. No one has had any issues with the fax machine I was using. I used a different fax machine this time.

## 2023-03-05 NOTE — Telephone Encounter (Signed)
We have faxed it to them 3 times. They keep saying its something on our end. I feel the problem is on their end.

## 2023-03-06 ENCOUNTER — Telehealth: Payer: Self-pay | Admitting: *Deleted

## 2023-03-06 NOTE — Patient Instructions (Signed)
Visit Information  Thank you for taking time to visit with me today. Please don't hesitate to contact me if I can be of assistance to you.   Following are the goals we discussed today:   Goals Addressed             This Visit's Progress    Placement at [place]       Activities and task to complete in order to accomplish goals.   Call or go to [place] to inquire about Medicaid for skilled nursing Continue to follow up with the  facility in [place] to follow up acceptance of bed offer Confirmed that patient continues to consider facility in [place] however would like to explore local facilities in the [place] of facilities provided to patient with contact numbers         If you are experiencing a [place] or [place] or need someone to talk to, please call 911   Patient verbalizes understanding of instructions and care plan provided today and agrees to view in [place]. Active MyChart status and patient understanding of how to access instructions and care plan via MyChart confirmed with patient.     No further follow up required: patient to contact this Education officer, museum with any additional questions regarding placement in a skilled facility  Show Low, [place] Worker  [facility] Care Management [phone]

## 2023-03-06 NOTE — Patient Outreach (Signed)
  Care Coordination   Follow Up Visit Note   03/06/2023 Name: [name] MRN: [id number] DOB: 1946/08/02  [name] is a 77 y.o. year old female who sees Venia Carbon, MD for primary care. I spoke with  Mervyn Skeeters by phone today.  What matters to the patients health and wellness today?  Medicaid application process, placement in skilled facility    Goals Addressed             This Visit's Progress    Placement at [facility] and task to complete in order to accomplish goals.   Call or go to Department of Social Services to inquire about Medicaid for skilled nursing Continue to follow up with the  facility in [place] to follow up acceptance of bed offer Confirmed that patient continues to consider facility in [place] however would like to explore local facilities in the [place] of facilities provided to patient with contact numbers        SDOH assessments and interventions completed:  No     Care Coordination Interventions:  Yes, provided  Interventions Today    Flowsheet Row Most Recent Value  Chronic Disease   Chronic disease during today's visit Other  [Multiple Sclerosis]  General Interventions   General Interventions Discussed/Reviewed General Interventions Reviewed, Level of Care  Level of Learned  [patient continues to consider facility in [place], provided contact information for local long term care facilities that accept Medicaid]  Applications Medicaid  [Medicaid application process discussed-Patient encouraged to apply for Medicaid following spend down]  Education Interventions   Applications Medicaid  [Medicaid application process discussed-Patient encouraged to apply for Medicaid following spend down]       Follow up plan: No further intervention required.   Encounter Outcome:  Pt. Visit Completed

## 2023-03-26 ENCOUNTER — Telehealth: Payer: Self-pay | Admitting: Internal Medicine

## 2023-03-26 NOTE — Telephone Encounter (Signed)
Patient called requesting a letter from Dr. Alphonsus Sias stating that she is only on medication methenamine (HIPREX) 1 g tablet, and that this is the only medication she takes. Requested to be faxed to [facility] [phone] attn: Lequita Halt) If needed, advise [phone].

## 2023-03-27 NOTE — Telephone Encounter (Signed)
Spoke to pt. Verified she is not taking anything else other than methenamine 1Gm every other day. Will send this TE encounter note.  Also, pt will be seeing the doctor in the facility for care.

## 2023-03-30 ENCOUNTER — Other Ambulatory Visit (HOSPITAL_COMMUNITY): Payer: Medicare Other

## 2023-04-02 NOTE — Telephone Encounter (Signed)
Patient called in stating that [facility] is still requesting that  just a small note saying " [name] medication has not changed". She said that they are needing this today in order for her to be able to get admitted there,she has already set up transportation  to leave on Tuesday. It has to have a  date and Dr. Vassie Moselle name on it as well.

## 2023-04-02 NOTE — Telephone Encounter (Signed)
The FL2 should have been all that was needed as explained before. We have sent a fax to [place] with the information of the 1 medication she take every other day.

## 2023-04-03 NOTE — Telephone Encounter (Signed)
Spoke to pt. I advised her that I faxed the letter from [employer] yesterday afternoon. Read her what it said. She appreciated the call back. Said to let Dr Alphonsus Sias know she really appreciated all of his care through the years. I wished her well.

## 2023-04-03 NOTE — Telephone Encounter (Signed)
Pt called back again requesting the note regarding her medication. Pt requested a call back from [place] when he returns on Monday. Call back # [phone]

## 2023-04-24 NOTE — Telephone Encounter (Signed)
Pt called requesting a call back stated she needs the number for the nursing home that Dr Alphonsus Sias provided for her . Please advise [phone]

## 2023-04-27 NOTE — Telephone Encounter (Signed)
Spoke to patient by telephone and was advised that she received a call from someone that was going to help her find an extended care facility to go into. Patient stated that the lady had given her the telephone number to call her back but she has lost it. Advised patient that it appears that [employer], Child psychotherapist had talked with her. Patient stated that she thinks that may be the person that she had talked to. Gave patient the Social Workers telephone number.

## 2023-04-27 NOTE — Telephone Encounter (Signed)
Okay; thanks.

## 2023-05-01 ENCOUNTER — Telehealth: Payer: Self-pay | Admitting: Internal Medicine

## 2023-05-01 NOTE — Telephone Encounter (Signed)
Patient called in and stated that a FL2 form needs to be faxed over to [facility] in [place]. She stated they also need any PT notes they may have and her medical history. She stated that it can be faxed over to [phone]. Thank you!

## 2023-05-04 NOTE — Telephone Encounter (Signed)
New FL2 formed filled out and signed. Faxed to number provided. Last OV note attached. No notes from PT in chart.

## 2023-05-06 NOTE — Telephone Encounter (Signed)
I have faxed it, again, to the number provided.

## 2023-05-06 NOTE — Telephone Encounter (Signed)
Patient called stating that nursing home said that they have not received FL2 and OV notes.

## 2023-05-08 ENCOUNTER — Telehealth: Payer: Medicare Other | Admitting: Internal Medicine

## 2023-05-08 NOTE — Telephone Encounter (Signed)
Patient contacted the office requesting  a new form of contact for [facility], who she says was supposed to help her find a nursing home to go to. Says it was given to her either by Dr. Alphonsus Sias or Carollee Herter she is unsure who it was, patient states the contact number given to her is not in service. Please advise, thank you.

## 2023-05-12 ENCOUNTER — Telehealth: Payer: Self-pay | Admitting: *Deleted

## 2023-05-13 NOTE — Patient Outreach (Signed)
  Care Coordination   Follow Up Visit Note   05/13/2023 Late Entry Name: [name] MRN: [id number] DOB: [birth year]  [name] is a 77 y.o. year old female who sees Karie Schwalbe, MD for primary care. I spoke with  Arsenio Loader by phone yesterday.  What matters to the patients health and wellness today?  Long term care placement    Goals Addressed             This Visit's Progress    Placement at Skilled Nursing Facility       Activities and task to complete in order to accomplish goals.   Call or go to Department of Social Services to inquire about Medicaid for skilled nursing Consider alternative facilities for long term care placement-list previously mailed and received        SDOH assessments and interventions completed:  No     Care Coordination Interventions:  Yes, provided  Interventions Today    Flowsheet Row Most Recent Value  Chronic Disease   Chronic disease during today's visit Other  [multiple sclerosis]  General Interventions   General Interventions Discussed/Reviewed General Interventions Discussed, Level of Care  Level of Care Skilled Nursing Facility, Applications  [patient states that [facility] declined her for long term care-patient will need to look for an alternative placement]  Applications Medicaid  [, patient encouraged to apply for long term care medicaid for placement purposes, contact number provided]  Education Interventions   Applications Medicaid  [, patient encouraged to apply for long term care medicaid for placement purposes, contact number provided]       Follow up plan: Follow up call scheduled for 05/18/23    Encounter Outcome:  Pt. Visit Completed

## 2023-05-13 NOTE — Patient Instructions (Signed)
Visit Information  Thank you for taking time to visit with me today. Please don't hesitate to contact me if I can be of assistance to you.   Following are the goals we discussed today:   Goals Addressed             This Visit's Progress    Placement at Skilled Nursing Facility       Activities and task to complete in order to accomplish goals.   Call or go to Department of Social Services to inquire about Medicaid for skilled nursing Consider alternative facilities for long term care placement-list previously mailed and received        Our next appointment is by telephone on 05/21/23 at 9am  Please call the care guide team at [phone] if you need to cancel or reschedule your appointment.   If you are experiencing a Mental Health or Behavioral Health Crisis or need someone to talk to, please call 911   Patient verbalizes understanding of instructions and care plan provided today and agrees to view in MyChart. Active MyChart status and patient understanding of how to access instructions and care plan via MyChart confirmed with patient.     Telephone follow up appointment with care management team member scheduled for: 05/21/23  Verna Czech, LCSW Clinical Social Worker  [facility] Care Management [phone]

## 2023-05-18 ENCOUNTER — Ambulatory Visit: Payer: Self-pay | Admitting: *Deleted

## 2023-05-19 NOTE — Patient Instructions (Signed)
Visit Information  Thank you for taking time to visit with me today. Please don't hesitate to contact me if I can be of assistance to you.   Following are the goals we discussed today:   Goals Addressed             This Visit's Progress    Placement at Skilled Nursing Facility       Activities and task to complete in order to accomplish goals.   Call or go to Department of Social Services to inquire about applying for Medicaid for long term care placement        Our next appointment is by telephone on 06/02/23 at 2pm  Please call the care guide team at [phone] if you need to cancel or reschedule your appointment.   If you are experiencing a Mental Health or Behavioral Health Crisis or need someone to talk to, please call 911   Patient verbalizes understanding of instructions and care plan provided today and agrees to view in MyChart. Active MyChart status and patient understanding of how to access instructions and care plan via MyChart confirmed with patient.     Telephone follow up appointment with care management team member scheduled for:06/02/23  Verna Czech, [place] Clinical Social Worker  [facility] Care Management [phone] '

## 2023-05-19 NOTE — Patient Outreach (Signed)
  Care Coordination   Follow Up Visit Note   05/19/2023 Name: [name] MRN: [id number] DOB: 1946/08/25  [name] is a 77 y.o. year old female who sees Karie Schwalbe, MD for primary care. I spoke with  [name] by phone on 05/18/23.  What matters to the patients health and wellness today?  Long term care placement    Goals Addressed             This Visit's Progress    Placement at Skilled Nursing Facility       Activities and task to complete in order to accomplish goals.   Call or go to Department of Social Services to inquire about applying for Medicaid for long term care placement        SDOH assessments and interventions completed:  No     Care Coordination Interventions:  Yes, provided  Interventions Today    Flowsheet Row Most Recent Value  Chronic Disease   Chronic disease during today's visit Other  [multiple sclerosis]  General Interventions   General Interventions Discussed/Reviewed General Interventions Reviewed, Level of Care  Level of Care Applications, Skilled Nursing Facility  Applications Medicaid  [patient states that she continues to be interested in long term care-continues with plan to contact the Department of Social Services to discuss long term care medicaid process-patient encouraged to contact DSS as soon as possible]  Education Interventions   Applications Medicaid  [patient states that she continues to be interested in long term care-continues with plan to contact the Department of Social Services to discuss long term care medicaid process-patient encouraged to contact DSS as soon as possible]       Follow up plan: Follow up call scheduled for 05/1323    Encounter Outcome:  Pt. Visit Completed

## 2023-05-20 NOTE — Telephone Encounter (Signed)
FL2 form and last OV note faxed to [facility] attn Azzie Glatter

## 2023-05-20 NOTE — Telephone Encounter (Signed)
[facility], turned the patient down for facility care. She was told did not accept (patient not allowed to take motorized chair, only push wheel chairs, nursing director made decision too full of like to have to stay in bed all the time  [facility] told her she could take her chair Atten  Azzie Glatter  [phone] Fax# [phone]  FL2

## 2023-05-22 NOTE — Telephone Encounter (Signed)
Left message on nonverified VM without the pt's name advising her that we only have the last 4 digits of SS#. She will have to reach out to the pt for that. I will add the last 4 to the form and email it now.

## 2023-05-22 NOTE — Telephone Encounter (Signed)
Azzie Glatter from [place] called requesting that pt FL2 form be email to [email or web address]  stated she need pt S.S # also # [phone]

## 2023-05-28 ENCOUNTER — Encounter: Payer: Self-pay | Admitting: *Deleted

## 2023-05-29 ENCOUNTER — Telehealth: Payer: Self-pay | Admitting: Internal Medicine

## 2023-05-29 NOTE — Telephone Encounter (Signed)
Patient  would like FL2 to be sent to [facility]  Fax# [phone] Attn: Grayce Sessions

## 2023-06-01 DIAGNOSIS — Z0279 Encounter for issue of other medical certificate: Secondary | ICD-10-CM

## 2023-06-02 NOTE — Telephone Encounter (Signed)
The wrong fax number was provided in the message so the fax yesterday failed. I called and verified its [phone]. Faxed again. Pt was made aware yesterday that she would be billed for the [facility] form as we have done it multiple times at no charge.

## 2023-06-02 NOTE — Telephone Encounter (Signed)
Everything noted below has been faxed from [employer] to [facility].

## 2023-06-02 NOTE — Telephone Encounter (Signed)
Called and spoke to pt and son and advised that the only thing sent to them was the [facility] form. I actually added the last OV note on my own. If we are not told to send other things, we can't send them. Advised her I would try to get them sent tomorrow morning.

## 2023-06-02 NOTE — Telephone Encounter (Signed)
Patient called in to make sure: -history/physical -labwork -vaccinations -any hospital discharge notes  Was sent along with her fl2 form back to [facility]

## 2023-07-19 ENCOUNTER — Emergency Department (HOSPITAL_COMMUNITY): Payer: Medicare Other

## 2023-07-19 ENCOUNTER — Other Ambulatory Visit: Payer: Self-pay

## 2023-07-19 ENCOUNTER — Inpatient Hospital Stay (HOSPITAL_COMMUNITY)
Admission: EM | Admit: 2023-07-19 | Discharge: 2023-07-25 | DRG: 853 | Disposition: A | Discharge status: Skilled Nursing Facility | Payer: Medicare Other | Source: Skilled Nursing Facility | Attending: Internal Medicine | Admitting: Internal Medicine

## 2023-07-19 DIAGNOSIS — M4802 Spinal stenosis, cervical region: Secondary | ICD-10-CM | POA: Diagnosis present

## 2023-07-19 DIAGNOSIS — K810 Acute cholecystitis: Secondary | ICD-10-CM

## 2023-07-19 DIAGNOSIS — K592 Neurogenic bowel, not elsewhere classified: Secondary | ICD-10-CM | POA: Diagnosis present

## 2023-07-19 DIAGNOSIS — Z79899 Other long term (current) drug therapy: Secondary | ICD-10-CM

## 2023-07-19 DIAGNOSIS — Z96611 Presence of right artificial shoulder joint: Secondary | ICD-10-CM | POA: Diagnosis present

## 2023-07-19 DIAGNOSIS — R0902 Hypoxemia: Principal | ICD-10-CM

## 2023-07-19 DIAGNOSIS — N319 Neuromuscular dysfunction of bladder, unspecified: Secondary | ICD-10-CM | POA: Diagnosis not present

## 2023-07-19 DIAGNOSIS — F444 Conversion disorder with motor symptom or deficit: Secondary | ICD-10-CM | POA: Diagnosis present

## 2023-07-19 DIAGNOSIS — Z7401 Bed confinement status: Secondary | ICD-10-CM

## 2023-07-19 DIAGNOSIS — I341 Nonrheumatic mitral (valve) prolapse: Secondary | ICD-10-CM | POA: Diagnosis present

## 2023-07-19 DIAGNOSIS — J90 Pleural effusion, not elsewhere classified: Secondary | ICD-10-CM | POA: Diagnosis present

## 2023-07-19 DIAGNOSIS — J9811 Atelectasis: Secondary | ICD-10-CM | POA: Diagnosis present

## 2023-07-19 DIAGNOSIS — N200 Calculus of kidney: Secondary | ICD-10-CM | POA: Diagnosis present

## 2023-07-19 DIAGNOSIS — A419 Sepsis, unspecified organism: Secondary | ICD-10-CM

## 2023-07-19 DIAGNOSIS — R7989 Other specified abnormal findings of blood chemistry: Secondary | ICD-10-CM | POA: Diagnosis present

## 2023-07-19 DIAGNOSIS — Z888 Allergy status to other drugs, medicaments and biological substances status: Secondary | ICD-10-CM

## 2023-07-19 DIAGNOSIS — R6520 Severe sepsis without septic shock: Secondary | ICD-10-CM | POA: Diagnosis present

## 2023-07-19 DIAGNOSIS — E876 Hypokalemia: Secondary | ICD-10-CM | POA: Diagnosis present

## 2023-07-19 DIAGNOSIS — E785 Hyperlipidemia, unspecified: Secondary | ICD-10-CM | POA: Diagnosis not present

## 2023-07-19 DIAGNOSIS — J9601 Acute respiratory failure with hypoxia: Secondary | ICD-10-CM | POA: Diagnosis not present

## 2023-07-19 DIAGNOSIS — R252 Cramp and spasm: Secondary | ICD-10-CM | POA: Diagnosis present

## 2023-07-19 DIAGNOSIS — A4159 Other Gram-negative sepsis: Principal | ICD-10-CM | POA: Diagnosis present

## 2023-07-19 DIAGNOSIS — Z91041 Radiographic dye allergy status: Secondary | ICD-10-CM

## 2023-07-19 DIAGNOSIS — G35 Multiple sclerosis: Secondary | ICD-10-CM | POA: Diagnosis present

## 2023-07-19 DIAGNOSIS — Z993 Dependence on wheelchair: Secondary | ICD-10-CM

## 2023-07-19 DIAGNOSIS — K8000 Calculus of gallbladder with acute cholecystitis without obstruction: Secondary | ICD-10-CM | POA: Diagnosis present

## 2023-07-19 DIAGNOSIS — Z823 Family history of stroke: Secondary | ICD-10-CM

## 2023-07-19 DIAGNOSIS — G8220 Paraplegia, unspecified: Secondary | ICD-10-CM | POA: Diagnosis present

## 2023-07-19 DIAGNOSIS — M810 Age-related osteoporosis without current pathological fracture: Secondary | ICD-10-CM | POA: Diagnosis present

## 2023-07-19 DIAGNOSIS — Z1152 Encounter for screening for COVID-19: Secondary | ICD-10-CM

## 2023-07-19 LAB — COMPREHENSIVE METABOLIC PANEL
ALT: 26 U/L (ref 0–44)
AST: 53 U/L — ABNORMAL HIGH (ref 15–41)
Albumin: 3.3 g/dL — ABNORMAL LOW (ref 3.5–5.0)
Alkaline Phosphatase: 108 U/L (ref 38–126)
Anion gap: 15 (ref 5–15)
BUN: 19 mg/dL (ref 8–23)
CO2: 18 mmol/L — ABNORMAL LOW (ref 22–32)
Calcium: 9 mg/dL (ref 8.9–10.3)
Chloride: 108 mmol/L (ref 98–111)
Creatinine, Ser: 0.73 mg/dL (ref 0.44–1.00)
GFR, Estimated: >60 mL/min (ref >60)
Glucose, Bld: 128 mg/dL — ABNORMAL HIGH (ref 70–99)
Potassium: 4.4 mmol/L (ref 3.5–5.1)
Sodium: 141 mmol/L (ref 135–145)
Total Bilirubin: 1.7 mg/dL — ABNORMAL HIGH (ref 0.3–1.2)
Total Protein: 6.5 g/dL (ref 6.5–8.1)

## 2023-07-19 LAB — CBC WITH DIFFERENTIAL/PLATELET
Abs Immature Granulocytes: 0.2 10*3/uL — ABNORMAL HIGH (ref 0.00–0.07)
Basophils Absolute: 0 10*3/uL (ref 0.0–0.1)
Basophils Relative: 0 %
Eosinophils Absolute: 0 10*3/uL (ref 0.0–0.5)
Eosinophils Relative: 0 %
HCT: 44.3 % (ref 36.0–46.0)
Hemoglobin: 14.3 g/dL (ref 12.0–15.0)
Immature Granulocytes: 1 %
Lymphocytes Relative: 2 %
Lymphs Abs: 0.4 10*3/uL — ABNORMAL LOW (ref 0.7–4.0)
MCH: 30.2 pg (ref 26.0–34.0)
MCHC: 32.3 g/dL (ref 30.0–36.0)
MCV: 93.5 fL (ref 80.0–100.0)
Monocytes Absolute: 0.1 10*3/uL (ref 0.1–1.0)
Monocytes Relative: 1 %
Neutro Abs: 22.4 10*3/uL — ABNORMAL HIGH (ref 1.7–7.7)
Neutrophils Relative %: 96 %
Platelets: 412 10*3/uL — ABNORMAL HIGH (ref 150–400)
RBC: 4.74 MIL/uL (ref 3.87–5.11)
RDW: 12.4 % (ref 11.5–15.5)
WBC: 23.1 10*3/uL — ABNORMAL HIGH (ref 4.0–10.5)
nRBC: 0 % (ref 0.0–0.2)

## 2023-07-19 LAB — RESP PANEL BY RT-PCR (RSV, FLU A&B, COVID)  RVPGX2
Influenza A by PCR: NEGATIVE
Influenza B by PCR: NEGATIVE
Resp Syncytial Virus by PCR: NEGATIVE
SARS Coronavirus 2 by RT PCR: NEGATIVE

## 2023-07-19 LAB — LIPASE, BLOOD: Lipase: 36 U/L (ref 11–51)

## 2023-07-19 LAB — D-DIMER, QUANTITATIVE: D-Dimer, Quant: >20 ug/mL-FEU — ABNORMAL HIGH (ref 0.00–0.50)

## 2023-07-19 LAB — TROPONIN I (HIGH SENSITIVITY)
Troponin I (High Sensitivity): 11 ng/L (ref <18)
Troponin I (High Sensitivity): 10 ng/L (ref <18)

## 2023-07-19 LAB — MRSA NEXT GEN BY PCR, NASAL: MRSA by PCR Next Gen: NOT DETECTED

## 2023-07-19 MED ORDER — POLYETHYLENE GLYCOL 3350 17 G PO PACK: 17.0000 g | PACK | Freq: Every day | ORAL | Status: DC | PRN

## 2023-07-19 MED ORDER — PIPERACILLIN-TAZOBACTAM 3.375 G IVPB 30 MIN
3.3750 g | Freq: Once | INTRAVENOUS | Status: DC
  Filled 2023-07-19: qty 50

## 2023-07-19 MED ORDER — HEPARIN (PORCINE) 25000 UT/250ML-% IV SOLN
950.0000 [IU]/h | INTRAVENOUS | Status: DC
  Administered 2023-07-19: 950 [IU]/h via INTRAVENOUS
  Filled 2023-07-19: qty 250

## 2023-07-19 MED ORDER — LACTATED RINGERS IV BOLUS
1000.0000 mL | Freq: Once | INTRAVENOUS | Status: AC
  Administered 2023-07-19: 1000 mL via INTRAVENOUS

## 2023-07-19 MED ORDER — ACETAMINOPHEN 650 MG RE SUPP: 650.0000 mg | Freq: Four times a day (QID) | RECTAL | Status: DC | PRN

## 2023-07-19 MED ORDER — ACETAMINOPHEN 325 MG PO TABS
650.0000 mg | ORAL_TABLET | Freq: Four times a day (QID) | ORAL | Status: DC | PRN
  Administered 2023-07-19: 650 mg via ORAL
  Filled 2023-07-19: qty 2

## 2023-07-19 MED ORDER — ONDANSETRON HCL 4 MG/2ML IJ SOLN
4.0000 mg | Freq: Once | INTRAMUSCULAR | Status: AC
  Administered 2023-07-20: 4 mg via INTRAVENOUS
  Filled 2023-07-19: qty 2

## 2023-07-19 MED ORDER — SODIUM CHLORIDE 0.9% FLUSH
3.0000 mL | Freq: Two times a day (BID) | INTRAVENOUS | Status: DC
  Administered 2023-07-20 – 2023-07-24 (×9): 3 mL via INTRAVENOUS

## 2023-07-19 MED ORDER — PIPERACILLIN-TAZOBACTAM 3.375 G IVPB 30 MIN
3.3750 g | Freq: Once | INTRAVENOUS | Status: AC
  Administered 2023-07-19: 3.375 g via INTRAVENOUS
  Filled 2023-07-19: qty 50

## 2023-07-19 MED ORDER — HEPARIN BOLUS VIA INFUSION
3500.0000 [IU] | Freq: Once | INTRAVENOUS | Status: AC
  Administered 2023-07-19: 3500 [IU] via INTRAVENOUS
  Filled 2023-07-19: qty 3500

## 2023-07-19 MED ORDER — PIPERACILLIN-TAZOBACTAM 3.375 G IVPB
3.3750 g | Freq: Three times a day (TID) | INTRAVENOUS | Status: DC
  Administered 2023-07-20 (×2): 3.375 g via INTRAVENOUS
  Filled 2023-07-19 (×2): qty 50

## 2023-07-19 MED ORDER — SODIUM CHLORIDE 0.9 % IV SOLN: INTRAVENOUS | Status: AC

## 2023-07-19 NOTE — ED Notes (Signed)
Pt to ct- well appearing upon transport.

## 2023-07-19 NOTE — ED Notes (Signed)
IV team at bedside 

## 2023-07-19 NOTE — H&P (Signed)
History and Physical   [name]:[id number] DOB: February 13, 1946 DOA: 07/19/2023  PCP: Karie Schwalbe, MD   Patient coming from: Home  Chief Complaint: Back pain, chest pain, nausea, vomiting.  HPI: [name] is a 77 y.o. female with medical history significant of MS, paraplegia, neurogenic bladder/bowel, spasticity, hyperlipidemia, BPPV, cervical spinal stenosis presenting with multiple complaints including nausea, vomiting, chest pain, back pain.  Patient is bedbound at baseline due to progressive MS.  Has had some back pain for the past couple weeks which is worse with repositioning in her bed.  Reports around 3 days of chest pain with some intermittent shortness of breath.  Also reports a nonproductive cough.  Yesterday she began to have some nausea vomiting abdominal pain.  EMS was called and noted to be hypoxic initially started on 6 L but weaned down to 3 L.    She denies fevers, chills.  ED Course: Vital signs in the ED notable for heart rate in the 80s to 100s, respiratory rate in the 20s, requiring 3 L to maintain saturation.  Lab workup included CMP with bicarb 18, glucose 128, albumin 3.3, AST 53, T. bili 1.7.  CBC with leukocytosis to 23.1, platelets 417.  Troponin negative x 2.  Lipase normal.  D-dimer elevated to greater than 20.  Lactic acid pending.  Urinalysis pending.  Blood cultures pending.  Respiratory panel for flu COVID and RSV pending.  Chest x-ray showed right basilar atelectasis.  CT of the chest abdomen pelvis showed collapse/consolidation at lung bases, left greater than right.  Likely atelectasis but cannot exclude pneumonia.  Also noted was large stool volume up to 7 cm in diameter.  Nonobstructive renal stones and gallbladder stones were also noted.  Right upper quadrant ultrasound was performed which showed gallbladder wall thickening and positive sonographic Murphy sign suspicious suspicious for cholecystitis.  General surgery consulted and will see the  patient with hospitalist to admit.  Review of Systems: As per HPI otherwise all other systems reviewed and are negative.  Past Medical History:  Diagnosis Date   Anemia    Ankle fracture, left 07/15/2006   Ankle fracture, right 10/30/2006   [facility]   Compression fracture 12/15/2009   Dr. Dion Saucier    Disc herniation    L5, S1   History of kidney stones    MS (multiple sclerosis) (HCC)    evaluated by Dr. Sandria Manly   Multiple sclerosis [facility])    MVP (mitral valve prolapse) 12/15/1977   ECHO. Trace MR, trace TR, trace PR 07/17/03   Nephrolithiasis    Osteoporosis    Rib fractures 1998, 9629,5284   2   Toe fracture 12/15/1996   5    Past Surgical History:  Procedure Laterality Date   CYSTOSCOPY/URETEROSCOPY/HOLMIUM LASER/STENT PLACEMENT Right 10/07/2022   Procedure: CYSTOSCOPY RIGHT RETROGRADE RIGHT URETEROSCOPY/HOLMIUM LASER/STENT PLACEMENT;  Surgeon: Bjorn Pippin, MD;  Location: [facility];  Service: Urology;  Laterality: Right;  1 HR FOR CASE   LUMBAR DISC SURGERY  04/15/2007   L5, S1- [facility]   OPEN REDUCTION INTERNAL FIXATION (ORIF) TIBIA/FIBULA FRACTURE Left 10/13/2020   Procedure: OPEN REDUCTION INTERNAL FIXATION (ORIF) TIBIA/FIBULA FRACTURE;  Surgeon: Roby Lofts, MD;  Location: [facility] OR;  Service: Orthopedics;  Laterality: Left;   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 She has never used smokeless tobacco. She reports that she does not drink alcohol and does not use drugs.  Allergies  Allergen Reactions   Baclofen Diarrhea and Nausea And  Vomiting   Iodinated Contrast Media Hives   Procaine Hcl     UNSPECIFIED    Family History  Problem Relation Age of Onset   Stroke Mother    Neuropathy Brother    Stroke Father    Hypertension Neg Hx    Diabetes Neg Hx    Depression Neg Hx    Alcohol abuse Neg Hx    Drug abuse Neg Hx   Reviewed on admission  Prior to Admission medications   Medication Sig Start Date End Date Taking? Authorizing Provider  methenamine (HIPREX) 1 g tablet TAKE 1 TABLET BY MOUTH EVERY OTHER DAY 09/17/22   Karie Schwalbe, MD    Physical Exam: Vitals:   07/19/23 1300 07/19/23 1400 07/19/23 1500 07/19/23 1649  BP: 113/70 113/76 123/73   Pulse: 93 85 86   Resp: (!) 27 (!) 22 (!) 22   Temp:    100.3 F (37.9 C)  TempSrc:    Oral  SpO2: 96% 99% 100%   Weight:      Height:        Physical Exam Constitutional:      General: She is not in acute distress.    Appearance: Normal appearance.  HENT:     Head: Normocephalic and atraumatic.     Mouth/Throat:     Mouth: Mucous membranes are moist.     Pharynx: Oropharynx is clear.  Eyes:     Extraocular Movements: Extraocular movements intact.     Pupils: Pupils are equal, round, and reactive to light.  Cardiovascular:     Rate and Rhythm: Normal rate and regular rhythm.     Pulses: Normal pulses.     Heart sounds: Normal heart sounds.  Pulmonary:     Effort: Pulmonary effort is normal. No respiratory distress.     Breath sounds: Normal breath sounds.  Abdominal:     General: Bowel sounds are normal. There is no distension.     Palpations: Abdomen is soft.     Tenderness: There is abdominal tenderness.  Musculoskeletal:        General: No swelling or deformity.  Skin:    General: Skin is warm and dry.  Neurological:     General: No focal deficit present.     Mental Status: Mental status is at baseline.     Comments: Baseline paraplegia    Labs on Admission: I have personally reviewed following labs and imaging  studies  CBC: Recent Labs  Lab 07/19/23 1204  WBC 23.1*  NEUTROABS 22.4*  HGB 14.3  HCT 44.3  MCV 93.5  PLT 412*    Basic Metabolic Panel: Recent Labs  Lab 07/19/23 1204  NA 141  K 4.4  CL 108  CO2 18*  GLUCOSE 128*  BUN 19  CREATININE 0.73  CALCIUM 9.0    GFR: Estimated Creatinine Clearance: 48.7 mL/min (by C-G formula based on SCr of 0.73 mg/dL).  Liver Function Tests: Recent Labs  Lab 07/19/23 1204  AST 53*  ALT 26  ALKPHOS 108  BILITOT 1.7*  PROT  6.5  ALBUMIN 3.3*    Urine analysis:    Component Value Date/Time   COLORURINE STRAW (A) 11/26/2019 1046   APPEARANCEUR CLEAR 11/26/2019 1046   LABSPEC 1.010 11/26/2019 1046   PHURINE 7.0 11/26/2019 1046   GLUCOSEU 50 (A) 11/26/2019 1046   HGBUR NEGATIVE 11/26/2019 1046   BILIRUBINUR negative 04/05/2020 1423   KETONESUR 20 (A) 11/26/2019 1046   PROTEINUR Negative 04/05/2020 1423   PROTEINUR NEGATIVE 11/26/2019 1046   UROBILINOGEN 0.2 04/05/2020 1423   UROBILINOGEN 0.2 05/26/2007 2307   NITRITE negative 04/05/2020 1423   NITRITE NEGATIVE 11/26/2019 1046   LEUKOCYTESUR Negative 04/05/2020 1423   LEUKOCYTESUR NEGATIVE 11/26/2019 1046    Radiological Exams on Admission: CT CHEST ABDOMEN PELVIS WO CONTRAST  Result Date: 07/19/2023 CLINICAL DATA:  Chest pain hypoxia and cough. Nausea vomiting and abdominal pain. EXAM: CT CHEST, ABDOMEN AND PELVIS WITHOUT CONTRAST TECHNIQUE: Multidetector CT imaging of the chest, abdomen and pelvis was performed following the standard protocol without IV contrast. RADIATION DOSE REDUCTION: This exam was performed according to the departmental dose-optimization program which includes automated exposure control, adjustment of the mA and/or kV according to patient size and/or use of iterative reconstruction technique. COMPARISON:  CT urogram 09/08/2022 FINDINGS: CT CHEST FINDINGS Cardiovascular: The heart size is normal. No substantial pericardial effusion. Coronary artery  calcification is evident. Mild atherosclerotic calcification is noted in the wall of the thoracic aorta. Mediastinum/Nodes: No mediastinal lymphadenopathy. No evidence for gross hilar lymphadenopathy although assessment is limited by the lack of intravenous contrast on the current study. Small to moderate hiatal hernia. The esophagus has normal imaging features. There is no axillary lymphadenopathy. Lungs/Pleura: Dependent collapse/consolidation noted in the lung bases, left greater than right. No pulmonary edema or substantial pleural effusion. No suspicious pulmonary nodule or mass. Musculoskeletal: No worrisome lytic or sclerotic osseous abnormality. Right shoulder replacement CT ABDOMEN PELVIS FINDINGS Hepatobiliary: No suspicious focal abnormality in the liver on this study without intravenous contrast. 7 mm calcified gallstone evident. No intrahepatic or extrahepatic biliary dilation. Pancreas: No focal mass lesion. No dilatation of the main duct. No intraparenchymal cyst. No peripancreatic edema. Spleen: No splenomegaly. No suspicious focal mass lesion. Adrenals/Urinary Tract: No adrenal nodule or mass. Right kidney and ureter unremarkable. 7 mm nonobstructing stone lower pole left kidney. No left ureteral stone. No left hydroureteronephrosis. The urinary bladder appears normal for the degree of distention. Stomach/Bowel: Small to moderate hiatal hernia. Stomach otherwise unremarkable. Duodenum is normally positioned as is the ligament of Treitz. No small bowel wall thickening. No small bowel dilatation. The terminal ileum is normal. The appendix is normal. Colon is decompressed except for the rectum which is distended up to 7 cm diameter with large stool volume evident. Vascular/Lymphatic: There is mild atherosclerotic calcification of the abdominal aorta without aneurysm. There is no gastrohepatic or hepatoduodenal ligament lymphadenopathy. No retroperitoneal or mesenteric lymphadenopathy. No pelvic  sidewall lymphadenopathy. Reproductive: Hysterectomy.  There is no adnexal mass. Other: No intraperitoneal free fluid. Musculoskeletal: Bones are diffusely demineralized. Stable L5 compression deformity. IMPRESSION: 1. Dependent collapse/consolidation in the lung bases, left greater than right. Imaging features to likely related to atelectasis although pneumonia not entirely excluded. 2. Small to moderate hiatal hernia. 3. Cholelithiasis. 4. 7 mm nonobstructing stone lower pole left kidney. 5. Large stool volume in the rectum with distention up to 7 cm diameter. 6.  Aortic Atherosclerosis (ICD10-I70.0). Electronically Signed   By: Kennith Center M.D.   On: 07/19/2023 16:39   US Abdomen Limited RUQ (LIVER/GB)  Result Date: 07/19/2023 CLINICAL DATA:  [id number] Epigastric abdominal pain [id number] EXAM: ULTRASOUND ABDOMEN LIMITED RIGHT UPPER QUADRANT COMPARISON:  CT chest 07/09/2022 FINDINGS: Gallbladder: Gallbladder wall thickening up to 6.6 mm. Nonmobile cyst 9 mm gallstone in the gallbladder neck. No pericholecystic fluid. Sonographer describes sonographic Murphy's sign. Common bile duct: Diameter: 2.6 mm.  No intrahepatic biliary ductal dilatation. Liver: No focal lesion identified. Within normal limits in parenchymal echogenicity. Portal vein is patent on color Doppler imaging with normal direction of blood flow towards the liver. Other: Technologist describes technically difficult study secondary to body habitus. IMPRESSION: Cholelithiasis with gallbladder wall thickening and positive sonographic Murphy's sign, suggesting acute cholecystitis. Electronically Signed   By: Corlis Leak M.D.   On: 07/19/2023 16:31   DG Chest Port 1 View  Result Date: 07/19/2023 CLINICAL DATA:  sob EXAM: PORTABLE CHEST 1 VIEW COMPARISON:  CXR 07/10/23 FINDINGS: Assessment of the bilateral upper lobes is limited due to patient positioning. Within this limitation, no pleural effusion. No pneumothorax. Linear opacity in the right lung base  that appears unchanged from prior exam and most likely represents atelectasis. Unchanged cardiac and mediastinal contours. No new focal airspace opacity. No radiographically apparent displaced rib fractures. Visualized upper abdomen unremarkable. Right shoulder arthroplasty. IMPRESSION: Right basilar atelectasis. Electronically Signed   By: Lorenza Cambridge M.D.   On: 07/19/2023 13:33    EKG: Independently reviewed.  Sinus tachycardia at 102 bpm.  Low voltage multiple leads.  Nonspecific T wave changes.  Assessment/Plan Principal Problem:   Acute respiratory failure with hypoxia (HCC) Active Problems:   Dyslipidemia   Multiple sclerosis (HCC)   Paraplegia, unspecified (HCC)   Neurogenic bowel   Neurogenic bladder   Spasticity   Acute respiratory failure with hypoxia Rule out pulmonary embolism Rule out pneumonia Rule out MS exacerbation > Patient presenting with multiple complaints including chest pain and intermittent shortness of breath for the past several days. > Hypoxic on EMS arrival initially placed on 6 L but weaned to 3 L in the ED.  Remains tachypneic. > Currently VQ scan has been ordered due to history of contrast allergy but has had a CTA PE study done in the past.  This is when she noted to have reaction on her arm but this is where there was extensive extravasation but there were no systemic symptoms.  Had had successful CT with contrast prior to this 2 times around 2004 per chart review.  Discussed with patient and she would not like to attempt another CT with contrast.  Prefers plan of VQ scan, heparin in the meantime, DVT study.. > CT without contrast showed evidence of left greater than right likely atelectasis collapse/consolidation at the lung bases.  Unable to exclude pneumonia.  Does have leukocytosis unclear if this is due to suspected cholecystitis versus pneumonia.  On Zosyn > Currently lower suspicion for MS exacerbation, but will need to keep this consideration in  mind. - Monitor on progressive unit - Continue with supplemental oxygen, wean as tolerated - Continue with VQ scan - Bilateral lower extremity DVT study - Heparin while awaiting results of VTE evaluation - Continue Zosyn - Trend fever curve and WBC - Procalcitonin - Follow-up urinalysis, blood cultures - Follow-up viral screening labs  Acute cholecystitis > nausea vomiting abdominal pain as well.  Noted to have Has had ongoing evidence of acute cholecystitis on right upper quadrant ultrasound with gallbladder wall thickening and sonographic Murphy sign being positive.  Does also have a large stool burden which could  be contributing to some of her pain.  Does have gallbladder stones on CT. > General surgery consulted and will be following per EDP. - Monitor on progressive unit as above - Continue with Zosyn as above - Bowel rest - IV fluids  MS > Complicated by paraplegia, spasticity, neurogenic bladder, neurogenic bowel.  Bedbound. > Has not seen a provider in some time - Continue meds when confirmed by pharmacy  Hyperlipidemia - Continue meds when confirmed by pharmacy  DVT prophylaxis: Heparin  Code Status:   Full Family Communication:  Updated at bedside  Disposition Plan:   Patient is from:  SNF  Anticipated DC to:  SNF  Anticipated DC date:  1 to 4 days  Anticipated DC barriers: None  Consults called:  General surgery consulted in the ED Admission status:  Observation, progressive  Severity of Illness: The appropriate patient status for this patient is OBSERVATION. Observation status is judged to be reasonable and necessary in order to provide the required intensity of service to ensure the patient's safety. The patient's presenting symptoms, physical exam findings, and initial radiographic and laboratory data in the context of their medical condition is felt to place them at decreased risk for further clinical deterioration. Furthermore, it is anticipated that the  patient will be medically stable for discharge from the hospital within 2 midnights of admission.    Synetta Fail MD [facility]  How to contact the [facility] Attending or Consulting provider 7A - 7P or covering provider during after hours 7P -7A, for this patient?   Check the care team in [facility] and look for a) attending/consulting [facility] provider listed and b) the [facility] team listed Log into [email or web address] and use [place]'s universal password to access. If you do not have the password, please contact the hospital operator. Locate the [facility] provider you are looking for under [facility] and page to a number that you can be directly reached. If you still have difficulty reaching the provider, please page the [facility] (Director on Call) for the Hospitalists listed on amion for assistance.  07/19/2023, 5:29 PM
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
## 2023-07-19 NOTE — Progress Notes (Signed)
Pharmacy Antibiotic Note  [name] is a 77 y.o. female for which pharmacy has been consulted for zosyn dosing for pneumonia.  Estimated Creatinine Clearance: 48.7 mL/min (by C-G formula based on SCr of 0.73 mg/dL).  WBC 23.1; T 100.3; HR 92; RR 28 COVID neg / flu neg  Plan: Zosyn 3.375g IV q8h (4 hour infusion) Monitor WBC, fever, renal function, cultures De-escalate when able  Height: 5\' 3"  (160 cm) Weight: 59.9 kg (132 lb 0.9 oz) IBW/kg (Calculated) : 52.4  Temp (24hrs), Avg:99.6 F (37.6 C), Min:98.8 F (37.1 C), Max:100.3 F (37.9 C)  Recent Labs  Lab 07/19/23 1204  WBC 23.1*  CREATININE 0.73    Estimated Creatinine Clearance: 48.7 mL/min (by C-G formula based on SCr of 0.73 mg/dL).    Allergies  Allergen Reactions   Baclofen Diarrhea and Nausea And Vomiting   Iodinated Contrast Media Hives   Procaine Hcl     UNSPECIFIED   Microbiology results: Pending  Thank you for allowing pharmacy to be a part of this patient's care.  Delmar Landau, PharmD, BCPS 07/19/2023 6:34 PM ED Clinical Pharmacist -  [phone]

## 2023-07-19 NOTE — ED Notes (Signed)
Provider at bedside

## 2023-07-19 NOTE — ED Notes (Signed)
Phlebotomy at bedside attempting to draw blood.  

## 2023-07-19 NOTE — Progress Notes (Addendum)
ANTICOAGULATION CONSULT NOTE - Initial Consult  Pharmacy Consult for Heparin Indication: pulmonary embolus  Allergies  Allergen Reactions   Baclofen Diarrhea and Nausea And Vomiting   Iodinated Contrast Media Hives   Procaine Hcl     UNSPECIFIED    Patient Measurements: Height: 5\' 3"  (160 cm) Weight: 59.9 kg (132 lb 0.9 oz) IBW/kg (Calculated) : 52.4 Heparin Dosing Weight: 59.9 kg  Vital Signs: Temp: 100.3 F (37.9 C) (08/04 1649) Temp Source: Oral (08/04 1649) BP: 123/73 (08/04 1500) Pulse Rate: 86 (08/04 1500)  Labs: Recent Labs    07/19/23 1204 07/19/23 1513  HGB 14.3  --   HCT 44.3  --   PLT 412*  --   CREATININE 0.73  --   TROPONINIHS 11 10    Estimated Creatinine Clearance: 48.7 mL/min (by C-G formula based on SCr of 0.73 mg/dL).   Medical History: Past Medical History:  Diagnosis Date   Anemia    Ankle fracture, left 07/15/2006   Ankle fracture, right 10/30/2006   [facility]   Compression fracture 12/15/2009   Dr. Dion Saucier    Disc herniation    L5, S1   History of kidney stones    MS (multiple sclerosis) (HCC)    evaluated by Dr. Sandria Manly   Multiple sclerosis [facility])    MVP (mitral valve prolapse) 12/15/1977   ECHO. Trace MR, trace TR, trace PR 07/17/03   Nephrolithiasis    Osteoporosis    Rib fractures 1998, 8841,6606   2   Toe fracture 12/15/1996   5    Medications:  (Not in a hospital admission)  Scheduled:   ondansetron (ZOFRAN) IV  4 mg Intravenous Once   sodium chloride flush  3 mL Intravenous Q12H   Infusions:   piperacillin-tazobactam     PRN: acetaminophen **OR** acetaminophen, polyethylene glycol  Assessment: 54 yof with a history of MS bedbound at baseline and chronic recurrent UTIs . Patient is presenting with N/V, chest pain, and back pain. Heparin per pharmacy consult placed for pulmonary embolus.  Patient is not on anticoagulation prior to arrival.  CT Chest Dependent collapse/consolidation in the lung bases, left greater than  right. Imaging features to likely related to atelectasis although pneumonia not entirely excluded.  Hgb 14.3; plt 412 D-Dimer > 20  Goal of Therapy:  Heparin level 0.3-0.7 units/ml Monitor platelets by anticoagulation protocol: Yes   Plan:  Give IV heparin 3500 units bolus x 1 Start heparin infusion at 950 units/hr Check anti-Xa level in 8 hours and daily while on heparin Continue to monitor H&H and platelets  Delmar Landau, PharmD, BCPS 07/19/2023 6:16 PM ED Clinical Pharmacist -  [phone]

## 2023-07-19 NOTE — ED Notes (Signed)
Secondary RN at bedside attempting to draw blood.

## 2023-07-19 NOTE — Consult Note (Signed)
CC: stomach pain  Requesting provider: Dr Julieanne Manson  HPI: [name] is an 77 y.o. female with a past medical history of MS, paraplegia, neurogenic bladder and bowel, was brought from her skilled nursing facility earlier today with hypoxia, nausea, vomiting, back pain and stomach pain.  Patient states that she started having pain in her back about 2 weeks ago.  She developed nausea and vomiting this morning.  She states that she also started having mid abdominal pain a few days ago.  She states that she normally has a bowel movement every 3 to 4 days.  She can sense when she has to urinate or defecate but wears a pad.  When EMS arrived at the facility she was noted to be hypoxic requiring oxygen.  When they tried to remove the oxygen here in the ER she desatted but not as low and still was placed back on oxygen.  Prior abdominal surgeries include abdominal hysterectomy.  Labs in ER revealed a white count of 23,000, total bilirubin was 1.7.  She had an ultrasound that showed a thickened gallbladder wall with a stone in the neck without pericholecystic fluid.  Common bile duct was normal.  CT of her chest abdomen and pelvis which was noncontrasted showed a moderate hiatal hernia, consolidation in both lower lobes left greater than right and fecal impaction.  We were called to evaluate.  Patient has declined a CT PE scan.  She had a bad experience with a contrasted scan in the past where the IV infiltrated.  She has been started empirically on a heparin drip while awaiting a VQ scan  Son is at bedside  Patient is nonambulatory  Past Medical History:  Diagnosis Date   Anemia    Ankle fracture, left 07/15/2006   Ankle fracture, right 10/30/2006   [facility]   Compression fracture 12/15/2009   Dr. Dion Saucier    Disc herniation    L5, S1   History of kidney stones    MS (multiple sclerosis) (HCC)    evaluated by Dr. Sandria Manly   Multiple sclerosis [facility])    MVP (mitral valve prolapse) 12/15/1977   ECHO.  Trace MR, trace TR, trace PR 07/17/03   Nephrolithiasis    Osteoporosis    Rib fractures 1998, 9147,8295   2   Toe fracture 12/15/1996   5    Past Surgical History:  Procedure Laterality Date   CYSTOSCOPY/URETEROSCOPY/HOLMIUM LASER/STENT PLACEMENT Right 10/07/2022   Procedure: CYSTOSCOPY RIGHT RETROGRADE RIGHT URETEROSCOPY/HOLMIUM LASER/STENT PLACEMENT;  Surgeon: Bjorn Pippin, MD;  Location: [facility];  Service: Urology;  Laterality: Right;  1 HR FOR CASE   LUMBAR DISC SURGERY  04/15/2007   L5, S1- [facility]   OPEN REDUCTION INTERNAL FIXATION (ORIF) TIBIA/FIBULA FRACTURE Left 10/13/2020   Procedure: OPEN REDUCTION INTERNAL FIXATION (ORIF) TIBIA/FIBULA FRACTURE;  Surgeon: Roby Lofts, MD;  Location: [facility] OR;  Service: Orthopedics;  Laterality: Left;   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   REVERSE SHOULDER ARTHROPLASTY Right 10/15/2020   Procedure: REVERSE SHOULDER ARTHROPLASTY;  Surgeon: Bjorn Pippin, MD;  Location: [facility] OR;  Service: Orthopedics;  Laterality: Right;   SEPTOPLASTY  12/15/1981   deviated septum   TONSILLECTOMY AND ADENOIDECTOMY  12/15/1958   TOTAL ABDOMINAL HYSTERECTOMY W/ BILATERAL SALPINGOOPHORECTOMY  12/15/1980   for endometriosis    Family History  Problem Relation Age of Onset   Stroke Mother    Neuropathy Brother    Stroke Father  Hypertension Neg Hx    Diabetes Neg Hx    Depression Neg Hx    Alcohol abuse Neg Hx    Drug abuse Neg Hx     Social:  reports that she has never smoked. She has never used smokeless tobacco. She reports that she does not drink alcohol and does not use drugs.  Allergies:  Allergies  Allergen Reactions   Baclofen Diarrhea and Nausea And Vomiting   Iodinated Contrast Media Hives   Procaine Hcl     UNSPECIFIED    Medications: I have reviewed the patient's current medications.   ROS - all of the below systems have been reviewed with the patient and positives are indicated with bold text General: chills,  fever or night sweats Eyes: blurry vision or double vision ENT: epistaxis or sore throat Allergy/Immunology: itchy/watery eyes or nasal congestion Hematologic/Lymphatic: bleeding problems, blood clots or swollen lymph nodes Endocrine: temperature intolerance or unexpected weight changes Breast: new or changing breast lumps or nipple discharge Resp: cough, shortness of breath, or wheezing CV: chest pain or dyspnea on exertion GI: as per HPI GU: dysuria, trouble voiding, or hematuria MSK: joint pain or joint stiffness Neuro: TIA or stroke symptoms Derm: pruritus and skin lesion changes Psych: anxiety and depression  PE Blood pressure 123/73, pulse 86, temperature 99.1 F (37.3 C), temperature source Oral, resp. rate (!) 22, height 5\' 3"  (1.6 m), weight 59.9 kg, SpO2 100%. Constitutional: NAD; conversant; no deformities; obese, laying on left side Eyes: Moist conjunctiva; no lid lag; anicteric; PERRL Neck: Trachea midline; no thyromegaly Lungs: Normal respiratory effort; no tactile fremitus CV: RRR; no palpable thrills; no pitting edema GI: Abd soft, tender to deep palpation in the right upper quadrant but no guarding or rebound; no palpable hepatosplenomegaly MSK:  no clubbing/cyanosis Psychiatric: Appropriate affect; alert and oriented x3 Lymphatic: No palpable cervical or axillary lymphadenopathy Skin: No rash, lesions or jaundice  Results for orders placed or performed during the hospital encounter of 07/19/23 (from the past 48 hour(s))  Troponin I (High Sensitivity)     Status: None   Collection Time: 07/19/23 12:04 PM  Result Value Ref Range   Troponin I (High Sensitivity) 11 <18 ng/L    Comment: (NOTE) Elevated high sensitivity troponin I (hsTnI) values and significant  changes across serial measurements may suggest ACS but many other  chronic and acute conditions are known to elevate hsTnI results.  Refer to the "Links" section for chest pain algorithms and additional   guidance. Performed at [facility], [address], [place], [place] [address]   CBC with Differential     Status: Abnormal   Collection Time: 07/19/23 12:04 PM  Result Value Ref Range   WBC 23.1 (H) 4.0 - 10.5 K/uL   RBC 4.74 3.87 - 5.11 MIL/uL   Hemoglobin 14.3 12.0 - 15.0 g/dL   HCT 60.4 54.0 - 98.1 %   MCV 93.5 80.0 - 100.0 fL   MCH 30.2 26.0 - 34.0 pg   MCHC 32.3 30.0 - 36.0 g/dL   RDW 19.1 47.8 - 29.5 %   Platelets 412 (H) 150 - 400 K/uL   nRBC 0.0 0.0 - 0.2 %   Neutrophils Relative % 96 %   Neutro Abs 22.4 (H) 1.7 - 7.7 K/uL   Lymphocytes Relative 2 %   Lymphs Abs 0.4 (L) 0.7 - 4.0 K/uL   Monocytes Relative 1 %   Monocytes Absolute 0.1 0.1 - 1.0 K/uL   Eosinophils Relative 0 %  Eosinophils Absolute 0.0 0.0 - 0.5 K/uL   Basophils Relative 0 %   Basophils Absolute 0.0 0.0 - 0.1 K/uL   Immature Granulocytes 1 %   Abs Immature Granulocytes 0.20 (H) 0.00 - 0.07 K/uL    Comment: Performed at [facility], [address] [place], [place] [address]  Comprehensive metabolic panel     Status: Abnormal   Collection Time: 07/19/23 12:04 PM  Result Value Ref Range   Sodium 141 135 - 145 mmol/L   Potassium 4.4 3.5 - 5.1 mmol/L    Comment: HEMOLYSIS AT THIS LEVEL MAY AFFECT RESULT   Chloride 108 98 - 111 mmol/L   CO2 18 (L) 22 - 32 mmol/L   Glucose, Bld 128 (H) 70 - 99 mg/dL    Comment: Glucose reference range applies only to samples taken after fasting for at least 8 hours.   BUN 19 8 - 23 mg/dL   Creatinine, Ser 3.08 0.44 - 1.00 mg/dL   Calcium 9.0 8.9 - 65.7 mg/dL   Total Protein 6.5 6.5 - 8.1 g/dL   Albumin 3.3 (L) 3.5 - 5.0 g/dL   AST 53 (H) 15 - 41 U/L    Comment: HEMOLYSIS AT THIS LEVEL MAY AFFECT RESULT   ALT 26 0 - 44 U/L    Comment: HEMOLYSIS AT THIS LEVEL MAY AFFECT RESULT   Alkaline Phosphatase 108 38 - 126 U/L   Total Bilirubin 1.7 (H) 0.3 - 1.2 mg/dL    Comment: HEMOLYSIS AT THIS LEVEL MAY AFFECT RESULT   GFR, Estimated >60 >60 mL/min     Comment: (NOTE) Calculated using the CKD-EPI Creatinine Equation (2021)    Anion gap 15 5 - 15    Comment: Performed at [facility], [address], [place], [place] [address]  Lipase, blood     Status: None   Collection Time: 07/19/23 12:04 PM  Result Value Ref Range   Lipase 36 11 - 51 U/L    Comment: HEMOLYSIS AT THIS LEVEL MAY AFFECT RESULT Performed at [facility], [address] [place], [place] [address]   D-dimer, quantitative     Status: Abnormal   Collection Time: 07/19/23  1:42 PM  Result Value Ref Range   D-Dimer, Quant >20.00 (H) 0.00 - 0.50 ug/mL-FEU    Comment: REPEATED TO VERIFY (NOTE) At the manufacturer cut-off value of 0.5 g/mL FEU, this assay has a negative predictive value of 95-100%.This assay is intended for use in conjunction with a clinical pretest probability (PTP) assessment model to exclude pulmonary embolism (PE) and deep venous thrombosis (DVT) in outpatients suspected of PE or DVT. Results should be correlated with clinical presentation. Performed at [facility], [address] [place], [place] [address]   Troponin I (High Sensitivity)     Status: None   Collection Time: 07/19/23  3:13 PM  Result Value Ref Range   Troponin I (High Sensitivity) 10 <18 ng/L    Comment: (NOTE) Elevated high sensitivity troponin I (hsTnI) values and significant  changes across serial measurements may suggest ACS but many other  chronic and acute conditions are known to elevate hsTnI results.  Refer to the "Links" section for chest pain algorithms and additional  guidance. Performed at [facility], [address] [place], [place] [address]   Resp panel by RT-PCR (RSV, Flu A&B, Covid) Anterior Nasal Swab     Status: None   Collection Time: 07/19/23  3:25 PM   Specimen:  Anterior Nasal Swab  Result Value Ref Range   SARS Coronavirus 2 by RT PCR NEGATIVE NEGATIVE   Influenza A by PCR NEGATIVE NEGATIVE   Influenza B by PCR NEGATIVE  NEGATIVE    Comment: (NOTE) The Xpert Xpress SARS-CoV-2/FLU/RSV plus assay is intended as an aid in the diagnosis of influenza from Nasopharyngeal swab specimens and should not be used as a sole basis for treatment. Nasal washings and aspirates are unacceptable for Xpert Xpress SARS-CoV-2/FLU/RSV testing.  Fact Sheet for Patients: BloggerCourse.com  Fact Sheet for Healthcare Providers: SeriousBroker.it  This test is not yet approved or cleared by the [place] FDA and has been authorized for detection and/or diagnosis of SARS-CoV-2 by FDA under an Emergency Use Authorization (EUA). This EUA will remain in effect (meaning this test can be used) for the duration of the COVID-19 declaration under Section 564(b)(1) of the Act, 21 U.S.C. section 360bbb-3(b)(1), unless the authorization is terminated or revoked.     Resp Syncytial Virus by PCR NEGATIVE NEGATIVE    Comment: (NOTE) Fact Sheet for Patients: BloggerCourse.com  Fact Sheet for Healthcare Providers: SeriousBroker.it  This test is not yet approved or cleared by the [place] FDA and has been authorized for detection and/or diagnosis of SARS-CoV-2 by FDA under an Emergency Use Authorization (EUA). This EUA will remain in effect (meaning this test can be used) for the duration of the COVID-19 declaration under Section 564(b)(1) of the Act, 21 U.S.C. section 360bbb-3(b)(1), unless the authorization is terminated or revoked.  Performed at [facility], [address], [place], [place] [address]     CT CHEST ABDOMEN PELVIS WO CONTRAST  Result Date: 07/19/2023 CLINICAL DATA:  Chest pain hypoxia and cough. Nausea vomiting and abdominal pain. EXAM: CT CHEST, ABDOMEN AND PELVIS WITHOUT CONTRAST TECHNIQUE: Multidetector CT imaging of the chest, abdomen and pelvis was performed following the standard protocol without IV  contrast. RADIATION DOSE REDUCTION: This exam was performed according to the departmental dose-optimization program which includes automated exposure control, adjustment of the mA and/or kV according to patient size and/or use of iterative reconstruction technique. COMPARISON:  CT urogram 09/08/2022 FINDINGS: CT CHEST FINDINGS Cardiovascular: The heart size is normal. No substantial pericardial effusion. Coronary artery calcification is evident. Mild atherosclerotic calcification is noted in the wall of the thoracic aorta. Mediastinum/Nodes: No mediastinal lymphadenopathy. No evidence for gross hilar lymphadenopathy although assessment is limited by the lack of intravenous contrast on the current study. Small to moderate hiatal hernia. The esophagus has normal imaging features. There is no axillary lymphadenopathy. Lungs/Pleura: Dependent collapse/consolidation noted in the lung bases, left greater than right. No pulmonary edema or substantial pleural effusion. No suspicious pulmonary nodule or mass. Musculoskeletal: No worrisome lytic or sclerotic osseous abnormality. Right shoulder replacement CT ABDOMEN PELVIS FINDINGS Hepatobiliary: No suspicious focal abnormality in the liver on this study without intravenous contrast. 7 mm calcified gallstone evident. No intrahepatic or extrahepatic biliary dilation. Pancreas: No focal mass lesion. No dilatation of the main duct. No intraparenchymal cyst. No peripancreatic edema. Spleen: No splenomegaly. No suspicious focal mass lesion. Adrenals/Urinary Tract: No adrenal nodule or mass. Right kidney and ureter unremarkable. 7 mm nonobstructing stone lower pole left kidney. No left ureteral stone. No left hydroureteronephrosis. The urinary bladder appears normal for the degree of distention. Stomach/Bowel: Small to moderate hiatal hernia. Stomach otherwise unremarkable. Duodenum is normally positioned as is the ligament of Treitz. No small bowel wall thickening. No small bowel  dilatation. The terminal ileum is normal. The  appendix is normal. Colon is decompressed except for the rectum which is distended up to 7 cm diameter with large stool volume evident. Vascular/Lymphatic: There is mild atherosclerotic calcification of the abdominal aorta without aneurysm. There is no gastrohepatic or hepatoduodenal ligament lymphadenopathy. No retroperitoneal or mesenteric lymphadenopathy. No pelvic sidewall lymphadenopathy. Reproductive: Hysterectomy.  There is no adnexal mass. Other: No intraperitoneal free fluid. Musculoskeletal: Bones are diffusely demineralized. Stable L5 compression deformity. IMPRESSION: 1. Dependent collapse/consolidation in the lung bases, left greater than right. Imaging features to likely related to atelectasis although pneumonia not entirely excluded. 2. Small to moderate hiatal hernia. 3. Cholelithiasis. 4. 7 mm nonobstructing stone lower pole left kidney. 5. Large stool volume in the rectum with distention up to 7 cm diameter. 6.  Aortic Atherosclerosis (ICD10-I70.0). Electronically Signed   By: Kennith Center M.D.   On: 07/19/2023 16:39   US Abdomen Limited RUQ (LIVER/GB)  Result Date: 07/19/2023 CLINICAL DATA:  [id number] Epigastric abdominal pain [id number] EXAM: ULTRASOUND ABDOMEN LIMITED RIGHT UPPER QUADRANT COMPARISON:  CT chest 07/09/2022 FINDINGS: Gallbladder: Gallbladder wall thickening up to 6.6 mm. Nonmobile cyst 9 mm gallstone in the gallbladder neck. No pericholecystic fluid. Sonographer describes sonographic Murphy's sign. Common bile duct: Diameter: 2.6 mm.  No intrahepatic biliary ductal dilatation. Liver: No focal lesion identified. Within normal limits in parenchymal echogenicity. Portal vein is patent on color Doppler imaging with normal direction of blood flow towards the liver. Other: Technologist describes technically difficult study secondary to body habitus. IMPRESSION: Cholelithiasis with gallbladder wall thickening and positive sonographic Murphy's  sign, suggesting acute cholecystitis. Electronically Signed   By: Corlis Leak M.D.   On: 07/19/2023 16:31   DG Chest Port 1 View  Result Date: 07/19/2023 CLINICAL DATA:  sob EXAM: PORTABLE CHEST 1 VIEW COMPARISON:  CXR 07/10/23 FINDINGS: Assessment of the bilateral upper lobes is limited due to patient positioning. Within this limitation, no pleural effusion. No pneumothorax. Linear opacity in the right lung base that appears unchanged from prior exam and most likely represents atelectasis. Unchanged cardiac and mediastinal contours. No new focal airspace opacity. No radiographically apparent displaced rib fractures. Visualized upper abdomen unremarkable. Right shoulder arthroplasty. IMPRESSION: Right basilar atelectasis. Electronically Signed   By: Lorenza Cambridge M.D.   On: 07/19/2023 13:33    Imaging: Personally reviewed  A/P: [name] is an 77 y.o. female with  MS/nonambulatory status Acute calculus cholecystitis Hypoxia-etiology unclear Neurogenic bladder and bowel Hyperlipidemia Fecal impaction Moderate hiatal hernia Elevated D Dimer  Agree with admission by medical service IV antibiotics to cover both lungs and gallbladder. Before we could potentially proceed with cholecystectomy patient's pulmonary status would need to be determined.  If she does not fact have an acute PE and we would generally not recommend cholecystectomy this admission and her cholecystitis would be have to be managed in a different way  Consider suppository Repeat labs in the morning  I explained to the patient and her son typical treatment is IV antibiotics and cholecystectomy.  However since there is a possibility that she could have an underlying blood clot that needs to be determined before potentially having surgery.  The son asked if the patient could have a CT scan.  We discussed her history of tolerating IV contrast in the past but it sounds like she had an IV infiltrate.  Patient and son are going  to continue to talk about pros and cons of CT PE scan.  I told him that they change their minds to alert the  nurse who can alert the medical team  Data reviewed: I reviewed the ER notes, H&P from the hospitalist, CT scan, abdominal ultrasound, labs, pharmacy note  High MDM -extensive medical data reviewed, high medical problems addressed, patient identified risk factors  Mary Sella. Andrey Campanile, MD, FACS General, Bariatric, & Minimally Invasive Surgery [facility]

## 2023-07-19 NOTE — ED Provider Notes (Signed)
[place] EMERGENCY DEPARTMENT AT [facility] Provider Note   CSN: [id number] Arrival date & time: 07/19/23  1139     History  Chief Complaint  Patient presents with   Nausea   Emesis   Back Pain   Chest Pain    [name] is a 77 y.o. female.  Patient is a 77 year old female with a past medical history of MS bedbound at baseline and chronic recurrent UTIs presenting to the emergency department with nausea, vomiting, chest pain and back pain.  Patient states that she has had mid back pain going on for the last 2 weeks.  She states that the pain is worse when she is rolled and moved in bed at the nursing home.  She states a few days ago she started to develop some chest pain.  She is unable to describe how the chest pain feels.  She states that she has had some mild intermittent shortness of breath.  She states that she has had a nonproductive cough.  She denies any fevers.  She states that last night she started to develop some nausea and vomiting and generalized abdominal pain.  She denies any diarrhea.  She denies any dysuria or hematuria.  She was hypoxic on EMS arrival and initially placed on 6 L nasal cannula, transition to 3 on her arrival here.  The history is provided by the patient and a relative.  Emesis Back Pain Associated symptoms: chest pain   Chest Pain Associated symptoms: back pain and vomiting        Home Medications Prior to Admission medications   Medication Sig Start Date End Date Taking? Authorizing Provider  methenamine (HIPREX) 1 g tablet TAKE 1 TABLET BY MOUTH EVERY OTHER DAY 09/17/22   Karie Schwalbe, MD      Allergies    Baclofen, Iodinated contrast media, and Procaine hcl    Review of Systems   Review of Systems  Cardiovascular:  Positive for chest pain.  Gastrointestinal:  Positive for vomiting.  Musculoskeletal:  Positive for back pain.    Physical Exam Updated Vital Signs BP 123/73   Pulse 86   Temp 98.8 F (37.1 C)  (Oral)   Resp (!) 22   Ht 5\' 3"  (1.6 m)   Wt 59.9 kg   SpO2 100%   BMI 23.39 kg/m  Physical Exam Vitals and nursing note reviewed.  Constitutional:      Appearance: She is well-developed. She is ill-appearing (chronically).  HENT:     Head: Normocephalic and atraumatic.  Eyes:     Extraocular Movements: Extraocular movements intact.  Cardiovascular:     Rate and Rhythm: Normal rate and regular rhythm.     Heart sounds: Normal heart sounds.  Pulmonary:     Effort: Pulmonary effort is normal.     Breath sounds: Normal breath sounds.  Chest:     Chest wall: No tenderness.  Abdominal:     Palpations: Abdomen is soft.     Tenderness: There is abdominal tenderness (mild epigatric).     Comments: No CVA tenderness bilaterally  Musculoskeletal:        General: Normal range of motion.     Cervical back: Normal range of motion and neck supple.     Comments: No midline back tenderness, no reproducible pain on exam  Skin:    General: Skin is warm and dry.  Neurological:     Mental Status: She is alert and oriented to person, place, and time.  Psychiatric:        Mood and Affect: Mood normal.        Behavior: Behavior normal.     ED Results / Procedures / Treatments   Labs (all labs ordered are listed, but only abnormal results are displayed) Labs Reviewed  CBC WITH DIFFERENTIAL/PLATELET - Abnormal; Notable for the following components:      Result Value   WBC 23.1 (*)    Platelets 412 (*)    Neutro Abs 22.4 (*)    Lymphs Abs 0.4 (*)    Abs Immature Granulocytes 0.20 (*)    All other components within normal limits  COMPREHENSIVE METABOLIC PANEL - Abnormal; Notable for the following components:   CO2 18 (*)    Glucose, Bld 128 (*)    Albumin 3.3 (*)    AST 53 (*)    Total Bilirubin 1.7 (*)    All other components within normal limits  D-DIMER, QUANTITATIVE (NOT AT [facility]) - Abnormal; Notable for the following components:   D-Dimer, Quant >20.00 (*)    All other  components within normal limits  CULTURE, BLOOD (ROUTINE X 2)  CULTURE, BLOOD (ROUTINE X 2)  RESP PANEL BY RT-PCR (RSV, FLU A&B, COVID)  [id number]  LIPASE, BLOOD  URINALYSIS, W/ REFLEX TO CULTURE (INFECTION SUSPECTED)  I-STAT CG4 LACTIC ACID, ED  TROPONIN I (HIGH SENSITIVITY)  TROPONIN I (HIGH SENSITIVITY)    EKG EKG Interpretation Date/Time:  Sunday July 19 2023 11:54:27 EDT Ventricular Rate:  102 PR Interval:  134 QRS Duration:  88 QT Interval:  336 QTC Calculation: 438 R Axis:   154  Text Interpretation: Sinus tachycardia Low voltage, precordial leads Probable right ventricular hypertrophy HEART RATE INCREASED SINCE last EKG Confirmed by Elayne Snare ([id number]) on 07/19/2023 12:39:34 PM  Radiology DG Chest Port 1 View  Result Date: 07/19/2023 CLINICAL DATA:  sob EXAM: PORTABLE CHEST 1 VIEW COMPARISON:  CXR 07/10/23 FINDINGS: Assessment of the bilateral upper lobes is limited due to patient positioning. Within this limitation, no pleural effusion. No pneumothorax. Linear opacity in the right lung base that appears unchanged from prior exam and most likely represents atelectasis. Unchanged cardiac and mediastinal contours. No new focal airspace opacity. No radiographically apparent displaced rib fractures. Visualized upper abdomen unremarkable. Right shoulder arthroplasty. IMPRESSION: Right basilar atelectasis. Electronically Signed   By: Lorenza Cambridge M.D.   On: 07/19/2023 13:33    Procedures Procedures    Medications Ordered in ED Medications  ondansetron (ZOFRAN) injection 4 mg (has no administration in time range)  lactated ringers bolus 1,000 mL (0 mLs Intravenous Stopped 07/19/23 1542)    ED Course/ Medical Decision Making/ A&P Clinical Course as of 07/19/23 1549  Sun Jul 19, 2023  1400 Bicarb is low likely due to dehydration with the vomiting. IVF ordered. [VK]  1523 D-dimer >20. Plan for RUQ [place] and CT without contrast and will need to be admitted likely for VQ scan.  Signed out to Dr. Rush Landmark pending imaging and admission. [VK]    Clinical Course User Index [VK] Rexford Maus, DO                                 Medical Decision Making This patient presents to the ED with chief complaint(s) of back pain, chest pain, N/V with pertinent past medical history of MS, bed bound which further complicates the presenting complaint. The complaint involves an extensive differential diagnosis and also  carries with it a high risk of complications and morbidity.    The differential diagnosis includes ACS, arrhythmia, pneumonia, pneumothorax, pulmonary edema, pleural effusion, considering PE with patient's bedbound status, dissection less likely due to chronicity of pain and stable hemodynamics, considering pyelonephritis, gastritis, pancreatitis, hepatitis, cholelithiasis or cholecystitis  Additional history obtained: Additional history obtained from family Records reviewed Primary Care Documents  ED Course and Reassessment: Patient's arrival to the emergency department she is hemodynamically stable on 3 L nasal cannula in no acute distress.  Cannula was down titrated to 2 L and she remained stable.  The patient was mildly tachycardic on arrival concerning for possible PE and will have a D-dimer ordered.  EKG on arrival showed normal sinus rhythm without acute ischemic changes.  She will have troponin, D-dimer as well as chest x-ray, labs including urine and lipase to evaluate for cause of her symptoms and will be closely reassessed.  She declined any pain or nausea medication at this time.  Independent labs interpretation:  The following labs were independently interpreted: Elevated D-dimer, leukocytosis, mildly low bicarb  Independent visualization of imaging: - I independently visualized the following imaging with scope of interpretation limited to determining acute life threatening conditions related to emergency care: Chest x-ray, which revealed no acute  disease     Amount and/or Complexity of Data Reviewed Labs: ordered. Radiology: ordered.  Risk Prescription drug management.          Final Clinical Impression(s) / ED Diagnoses Final diagnoses:  None    Rx / DC Orders ED Discharge Orders     None         Rexford Maus, DO 07/19/23 1549
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
## 2023-07-19 NOTE — ED Notes (Signed)
Pt denies nausea at this time. Provider approved drink- pt was given a drink.

## 2023-07-19 NOTE — ED Provider Notes (Signed)
3:27 PM Care assumed from Dr. Theresia Lo.  At time of transfer of care, patient is awaiting some more workup prior to admission for new hypoxia in the setting of chest discomfort and abdominal discomfort.  Patient was found to have a D-dimer greater than 20 although she has a contrast allergy.  Will order VQ scan but will get noncontrasted CT scan of the chest abdomen and pelvis to look for infection and will also get her upper quadrant ultrasound given the elevated LFTs nausea vomiting and abdominal pain.   Patient will be admitted after some imaging is completed.  Unclear if she will get the VQ scan today.  4:49 PM Arrival quadrant ultrasound concerning for acute cholecystitis.  Given the LFT elevation, leukocytosis, and concerning ultrasound, will call general surgery.  CT scan returned showing no acute obstruction but did show some stools and some atelectasis cannot rule out pneumonia.  Will discuss with general surgery but anticipate they want to medicine admission with them see in consultation for the gallbladder.  Anticipate medicine admission to get the VQ scan and further workup as well.  5:00 PM Ultrasound returned showing evidence of likely acute cholecystitis.  General surgery was called and they agree with admission to medicine service given the possible PE and hypoxia as well.  Will order antibiotics as the CT scan also could not rule out pneumonia.  Medicine will be called for admission and general surgery will see in regards to the gallbladder.  Clinical Impression: 1. Hypoxia   2. Acute cholecystitis     Disposition: Admit  This note was prepared with assistance of Dragon voice recognition software. Occasional wrong-word or sound-a-like substitutions may have occurred due to the inherent limitations of voice recognition software.   CRITICAL CARE Performed by: Canary Brim  Total critical care time: 20 minutes Critical care time was exclusive of separately billable  procedures and treating other patients. Critical care was necessary to treat or prevent imminent or life-threatening deterioration. Critical care was time spent personally by me on the following activities: development of treatment plan with patient and/or surrogate as well as nursing, discussions with consultants, evaluation of patient's response to treatment, examination of patient, obtaining history from patient or surrogate, ordering and performing treatments and interventions, ordering and review of laboratory studies, ordering and review of radiographic studies, pulse oximetry and re-evaluation of patient's condition.    , Canary Brim, MD 07/19/23 1859

## 2023-07-20 ENCOUNTER — Encounter (HOSPITAL_COMMUNITY)
Admission: EM | Disposition: A | Discharge status: Skilled Nursing Facility | Payer: Self-pay | Source: Skilled Nursing Facility | Attending: Internal Medicine

## 2023-07-20 ENCOUNTER — Observation Stay (HOSPITAL_COMMUNITY): Payer: Medicare Other

## 2023-07-20 ENCOUNTER — Inpatient Hospital Stay (HOSPITAL_COMMUNITY): Payer: Medicare Other | Admitting: Certified Registered Nurse Anesthetist

## 2023-07-20 ENCOUNTER — Other Ambulatory Visit: Payer: Self-pay

## 2023-07-20 ENCOUNTER — Encounter (HOSPITAL_COMMUNITY): Payer: Self-pay | Admitting: Internal Medicine

## 2023-07-20 DIAGNOSIS — I341 Nonrheumatic mitral (valve) prolapse: Secondary | ICD-10-CM | POA: Diagnosis not present

## 2023-07-20 DIAGNOSIS — R7989 Other specified abnormal findings of blood chemistry: Secondary | ICD-10-CM | POA: Diagnosis present

## 2023-07-20 DIAGNOSIS — K5641 Fecal impaction: Secondary | ICD-10-CM | POA: Diagnosis not present

## 2023-07-20 DIAGNOSIS — R6520 Severe sepsis without septic shock: Secondary | ICD-10-CM | POA: Diagnosis not present

## 2023-07-20 DIAGNOSIS — I5031 Acute diastolic (congestive) heart failure: Secondary | ICD-10-CM | POA: Diagnosis not present

## 2023-07-20 DIAGNOSIS — Z96611 Presence of right artificial shoulder joint: Secondary | ICD-10-CM | POA: Diagnosis present

## 2023-07-20 DIAGNOSIS — J9601 Acute respiratory failure with hypoxia: Secondary | ICD-10-CM | POA: Diagnosis not present

## 2023-07-20 DIAGNOSIS — K8000 Calculus of gallbladder with acute cholecystitis without obstruction: Secondary | ICD-10-CM | POA: Diagnosis not present

## 2023-07-20 DIAGNOSIS — K819 Cholecystitis, unspecified: Secondary | ICD-10-CM | POA: Diagnosis not present

## 2023-07-20 DIAGNOSIS — E876 Hypokalemia: Secondary | ICD-10-CM | POA: Diagnosis present

## 2023-07-20 DIAGNOSIS — Z7401 Bed confinement status: Secondary | ICD-10-CM | POA: Diagnosis not present

## 2023-07-20 DIAGNOSIS — M4802 Spinal stenosis, cervical region: Secondary | ICD-10-CM | POA: Diagnosis present

## 2023-07-20 DIAGNOSIS — A4159 Other Gram-negative sepsis: Secondary | ICD-10-CM | POA: Diagnosis not present

## 2023-07-20 DIAGNOSIS — A419 Sepsis, unspecified organism: Secondary | ICD-10-CM

## 2023-07-20 DIAGNOSIS — G35 Multiple sclerosis: Secondary | ICD-10-CM | POA: Diagnosis not present

## 2023-07-20 DIAGNOSIS — K592 Neurogenic bowel, not elsewhere classified: Secondary | ICD-10-CM | POA: Diagnosis not present

## 2023-07-20 DIAGNOSIS — Z993 Dependence on wheelchair: Secondary | ICD-10-CM | POA: Diagnosis not present

## 2023-07-20 DIAGNOSIS — E785 Hyperlipidemia, unspecified: Secondary | ICD-10-CM | POA: Diagnosis not present

## 2023-07-20 DIAGNOSIS — M810 Age-related osteoporosis without current pathological fracture: Secondary | ICD-10-CM | POA: Diagnosis present

## 2023-07-20 DIAGNOSIS — Z888 Allergy status to other drugs, medicaments and biological substances status: Secondary | ICD-10-CM | POA: Diagnosis not present

## 2023-07-20 DIAGNOSIS — N319 Neuromuscular dysfunction of bladder, unspecified: Secondary | ICD-10-CM | POA: Diagnosis present

## 2023-07-20 DIAGNOSIS — N200 Calculus of kidney: Secondary | ICD-10-CM | POA: Diagnosis present

## 2023-07-20 DIAGNOSIS — K810 Acute cholecystitis: Secondary | ICD-10-CM | POA: Diagnosis present

## 2023-07-20 DIAGNOSIS — J90 Pleural effusion, not elsewhere classified: Secondary | ICD-10-CM | POA: Diagnosis not present

## 2023-07-20 DIAGNOSIS — J9811 Atelectasis: Secondary | ICD-10-CM | POA: Diagnosis not present

## 2023-07-20 DIAGNOSIS — Z1152 Encounter for screening for COVID-19: Secondary | ICD-10-CM | POA: Diagnosis not present

## 2023-07-20 DIAGNOSIS — Z91041 Radiographic dye allergy status: Secondary | ICD-10-CM | POA: Diagnosis not present

## 2023-07-20 DIAGNOSIS — F444 Conversion disorder with motor symptom or deficit: Secondary | ICD-10-CM | POA: Diagnosis not present

## 2023-07-20 HISTORY — PX: CHOLECYSTECTOMY: SHX55

## 2023-07-20 LAB — ECHOCARDIOGRAM COMPLETE
AR max vel: 3.23 cm2
AV Area VTI: 3.67 cm2
AV Area mean vel: 3.39 cm2
AV Mean grad: 3 mmHg
AV Peak grad: 5.7 mmHg
Ao pk vel: 1.19 m/s
Area-P 1/2: 4.63 cm2
Calc EF: 50.4 %
Height: 63 in
S' Lateral: 2.9 cm
Single Plane A2C EF: 50.4 %
Single Plane A4C EF: 50.3 %
Weight: 2225.76 oz

## 2023-07-20 LAB — CORTISOL: Cortisol, Plasma: 25.9 ug/dL

## 2023-07-20 LAB — TYPE AND SCREEN
ABO/RH(D): B POS
Antibody Screen: NEGATIVE

## 2023-07-20 LAB — TSH: TSH: 0.959 u[IU]/mL (ref 0.350–4.500)

## 2023-07-20 LAB — LACTIC ACID, PLASMA: Lactic Acid, Venous: 2.5 mmol/L (ref 0.5–1.9)

## 2023-07-20 LAB — C-REACTIVE PROTEIN: CRP: 17.7 mg/dL — ABNORMAL HIGH (ref <1.0)

## 2023-07-20 LAB — PROCALCITONIN: Procalcitonin: 57.93 ng/mL

## 2023-07-20 SURGERY — LAPAROSCOPIC CHOLECYSTECTOMY
Anesthesia: General

## 2023-07-20 MED ORDER — DEXAMETHASONE SODIUM PHOSPHATE 10 MG/ML IJ SOLN
INTRAMUSCULAR | Status: AC
  Filled 2023-07-20: qty 1

## 2023-07-20 MED ORDER — POTASSIUM CHLORIDE CRYS ER 20 MEQ PO TBCR
40.0000 meq | EXTENDED_RELEASE_TABLET | Freq: Once | ORAL | Status: AC
  Administered 2023-07-20: 40 meq via ORAL
  Filled 2023-07-20: qty 2

## 2023-07-20 MED ORDER — SODIUM CHLORIDE 0.9 % IV BOLUS
1000.0000 mL | Freq: Once | INTRAVENOUS | Status: AC
  Administered 2023-07-20: 1000 mL via INTRAVENOUS

## 2023-07-20 MED ORDER — SUGAMMADEX SODIUM 200 MG/2ML IV SOLN
INTRAVENOUS | Status: DC | PRN
  Administered 2023-07-20: 200 mg via INTRAVENOUS
  Administered 2023-07-20: 400 mg via INTRAVENOUS

## 2023-07-20 MED ORDER — ROCURONIUM BROMIDE 10 MG/ML (PF) SYRINGE
PREFILLED_SYRINGE | INTRAVENOUS | Status: AC
  Filled 2023-07-20: qty 10

## 2023-07-20 MED ORDER — FLEET ENEMA 7-19 GM/118ML RE ENEM: 1.0000 | ENEMA | Freq: Every day | RECTAL | Status: DC | PRN

## 2023-07-20 MED ORDER — ONDANSETRON HCL 4 MG/2ML IJ SOLN
INTRAMUSCULAR | Status: AC
  Filled 2023-07-20: qty 2

## 2023-07-20 MED ORDER — ONDANSETRON HCL 4 MG/2ML IJ SOLN: 4.0000 mg | Freq: Four times a day (QID) | INTRAMUSCULAR | Status: DC | PRN

## 2023-07-20 MED ORDER — METHOCARBAMOL 1000 MG/10ML IJ SOLN: 500.0000 mg | Freq: Four times a day (QID) | INTRAVENOUS | Status: DC | PRN

## 2023-07-20 MED ORDER — OXYCODONE HCL 5 MG PO TABS: 5.0000 mg | ORAL_TABLET | ORAL | Status: DC | PRN

## 2023-07-20 MED ORDER — DIBUCAINE (PERIANAL) 1 % EX OINT
TOPICAL_OINTMENT | CUTANEOUS | Status: AC
  Filled 2023-07-20: qty 28

## 2023-07-20 MED ORDER — LACTATED RINGERS IV SOLN: INTRAVENOUS | Status: DC

## 2023-07-20 MED ORDER — LACTATED RINGERS IV BOLUS
1000.0000 mL | Freq: Once | INTRAVENOUS | Status: AC
  Administered 2023-07-20: 1000 mL via INTRAVENOUS

## 2023-07-20 MED ORDER — PHENYLEPHRINE 80 MCG/ML (10ML) SYRINGE FOR IV PUSH (FOR BLOOD PRESSURE SUPPORT)
PREFILLED_SYRINGE | INTRAVENOUS | Status: AC
  Filled 2023-07-20: qty 10

## 2023-07-20 MED ORDER — LIDOCAINE 2% (20 MG/ML) 5 ML SYRINGE
INTRAMUSCULAR | Status: DC | PRN
  Administered 2023-07-20: 60 mg via INTRAVENOUS

## 2023-07-20 MED ORDER — FENTANYL CITRATE (PF) 250 MCG/5ML IJ SOLN
INTRAMUSCULAR | Status: DC | PRN
  Administered 2023-07-20: 50 ug via INTRAVENOUS

## 2023-07-20 MED ORDER — HYDROMORPHONE HCL 1 MG/ML IJ SOLN
INTRAMUSCULAR | Status: AC
  Filled 2023-07-20: qty 1

## 2023-07-20 MED ORDER — LIDOCAINE 2% (20 MG/ML) 5 ML SYRINGE
INTRAMUSCULAR | Status: AC
  Filled 2023-07-20: qty 5

## 2023-07-20 MED ORDER — MIDODRINE HCL 5 MG PO TABS
10.0000 mg | ORAL_TABLET | Freq: Three times a day (TID) | ORAL | Status: DC
  Administered 2023-07-20 (×3): 10 mg via ORAL
  Filled 2023-07-20 (×3): qty 2

## 2023-07-20 MED ORDER — CHLORHEXIDINE GLUCONATE 0.12 % MT SOLN: 15.0000 mL | Freq: Once | OROMUCOSAL | Status: AC

## 2023-07-20 MED ORDER — HYDROMORPHONE HCL 1 MG/ML IJ SOLN
0.2500 mg | INTRAMUSCULAR | Status: DC | PRN
  Administered 2023-07-20: 0.25 mg via INTRAVENOUS

## 2023-07-20 MED ORDER — METRONIDAZOLE 500 MG PO TABS
500.0000 mg | ORAL_TABLET | Freq: Two times a day (BID) | ORAL | Status: DC
  Administered 2023-07-20 – 2023-07-23 (×6): 500 mg via ORAL
  Filled 2023-07-20 (×6): qty 1

## 2023-07-20 MED ORDER — PHENYLEPHRINE 80 MCG/ML (10ML) SYRINGE FOR IV PUSH (FOR BLOOD PRESSURE SUPPORT)
PREFILLED_SYRINGE | INTRAVENOUS | Status: DC | PRN
  Administered 2023-07-20 (×2): 80 ug via INTRAVENOUS

## 2023-07-20 MED ORDER — FENTANYL CITRATE (PF) 250 MCG/5ML IJ SOLN
INTRAMUSCULAR | Status: AC
  Filled 2023-07-20: qty 5

## 2023-07-20 MED ORDER — PROPOFOL 10 MG/ML IV BOLUS
INTRAVENOUS | Status: DC | PRN
  Administered 2023-07-20: 100 mg via INTRAVENOUS

## 2023-07-20 MED ORDER — SODIUM CHLORIDE 0.9 % IV SOLN: INTRAVENOUS | Status: DC

## 2023-07-20 MED ORDER — PROPOFOL 10 MG/ML IV BOLUS
INTRAVENOUS | Status: AC
  Filled 2023-07-20: qty 20

## 2023-07-20 MED ORDER — OXYCODONE HCL 5 MG PO TABS: 10.0000 mg | ORAL_TABLET | ORAL | Status: DC | PRN

## 2023-07-20 MED ORDER — TECHNETIUM TO 99M ALBUMIN AGGREGATED
4.1000 | Freq: Once | INTRAVENOUS | Status: AC | PRN
  Administered 2023-07-20: 4.1 via INTRAVENOUS

## 2023-07-20 MED ORDER — PROPOFOL 500 MG/50ML IV EMUL
INTRAVENOUS | Status: DC | PRN
  Administered 2023-07-20: 100 ug/kg/min via INTRAVENOUS

## 2023-07-20 MED ORDER — PROCHLORPERAZINE EDISYLATE 10 MG/2ML IJ SOLN: 10.0000 mg | Freq: Four times a day (QID) | INTRAMUSCULAR | Status: DC | PRN

## 2023-07-20 MED ORDER — DOCUSATE SODIUM 100 MG PO CAPS
100.0000 mg | ORAL_CAPSULE | Freq: Two times a day (BID) | ORAL | Status: DC
  Administered 2023-07-20 – 2023-07-22 (×4): 100 mg via ORAL
  Filled 2023-07-20 (×3): qty 1

## 2023-07-20 MED ORDER — HEPARIN (PORCINE) 25000 UT/250ML-% IV SOLN: 750.0000 [IU]/h | INTRAVENOUS | Status: DC

## 2023-07-20 MED ORDER — GABAPENTIN 300 MG PO CAPS
300.0000 mg | ORAL_CAPSULE | Freq: Three times a day (TID) | ORAL | Status: DC
  Administered 2023-07-20: 300 mg via ORAL
  Filled 2023-07-20: qty 1

## 2023-07-20 MED ORDER — BUPIVACAINE-EPINEPHRINE (PF) 0.5% -1:200000 IJ SOLN
INTRAMUSCULAR | Status: AC
  Filled 2023-07-20: qty 30

## 2023-07-20 MED ORDER — AMISULPRIDE (ANTIEMETIC) 5 MG/2ML IV SOLN: 10.0000 mg | Freq: Once | INTRAVENOUS | Status: DC | PRN

## 2023-07-20 MED ORDER — HYDROMORPHONE HCL 1 MG/ML IJ SOLN: 0.5000 mg | INTRAMUSCULAR | Status: DC | PRN

## 2023-07-20 MED ORDER — GABAPENTIN 600 MG PO TABS
300.0000 mg | ORAL_TABLET | Freq: Three times a day (TID) | ORAL | Status: DC
Start: 2023-07-20 — End: 2023-07-20

## 2023-07-20 MED ORDER — SODIUM CHLORIDE 0.9 % IV SOLN
2.0000 g | INTRAVENOUS | Status: DC
  Administered 2023-07-20 – 2023-07-24 (×5): 2 g via INTRAVENOUS
  Filled 2023-07-20 (×5): qty 20

## 2023-07-20 MED ORDER — BUPIVACAINE-EPINEPHRINE (PF) 0.5% -1:200000 IJ SOLN
INTRAMUSCULAR | Status: DC | PRN
  Administered 2023-07-20: 30 mL via PERINEURAL

## 2023-07-20 MED ORDER — ACETAMINOPHEN 325 MG PO TABS
650.0000 mg | ORAL_TABLET | Freq: Four times a day (QID) | ORAL | Status: DC
  Administered 2023-07-20 – 2023-07-21 (×4): 650 mg via ORAL
  Filled 2023-07-20 (×4): qty 2

## 2023-07-20 MED ORDER — ORAL CARE MOUTH RINSE: 15.0000 mL | Freq: Once | OROMUCOSAL | Status: AC

## 2023-07-20 MED ORDER — DEXAMETHASONE SODIUM PHOSPHATE 10 MG/ML IJ SOLN
INTRAMUSCULAR | Status: DC | PRN
  Administered 2023-07-20: 4 mg via INTRAVENOUS

## 2023-07-20 MED ORDER — HEPARIN SODIUM (PORCINE) 5000 UNIT/ML IJ SOLN
5000.0000 [IU] | Freq: Three times a day (TID) | INTRAMUSCULAR | Status: DC
  Administered 2023-07-20 – 2023-07-25 (×14): 5000 [IU] via SUBCUTANEOUS
  Filled 2023-07-20 (×14): qty 1

## 2023-07-20 MED ORDER — CHLORHEXIDINE GLUCONATE 0.12 % MT SOLN
OROMUCOSAL | Status: AC
  Administered 2023-07-20: 15 mL via OROMUCOSAL
  Filled 2023-07-20: qty 15

## 2023-07-20 MED ORDER — PANTOPRAZOLE SODIUM 40 MG IV SOLR
40.0000 mg | INTRAVENOUS | Status: DC
  Administered 2023-07-20 – 2023-07-21 (×2): 40 mg via INTRAVENOUS
  Filled 2023-07-20 (×2): qty 10

## 2023-07-20 MED ORDER — ROCURONIUM BROMIDE 10 MG/ML (PF) SYRINGE
PREFILLED_SYRINGE | INTRAVENOUS | Status: DC | PRN
  Administered 2023-07-20: 80 mg via INTRAVENOUS

## 2023-07-20 SURGICAL SUPPLY — 69 items
ADH SKN CLS APL DERMABOND .7 (GAUZE/BANDAGES/DRESSINGS) ×1
APL PRP STRL LF DISP 70% ISPRP (MISCELLANEOUS) ×1
APPLIER CLIP ROT 10 11.4 M/L (STAPLE) ×1
APR CLP MED LRG 11.4X10 (STAPLE) ×1
BAG COUNTER SPONGE SURGICOUNT (BAG) ×2 IMPLANT
BAG SPEC RTRVL 10 TROC 200 (ENDOMECHANICALS) ×1
BAG SPNG CNTER NS LX DISP (BAG) ×1
CANISTER SUCT 3000ML PPV (MISCELLANEOUS) ×2 IMPLANT
CHLORAPREP W/TINT 26 (MISCELLANEOUS) ×2 IMPLANT
CLIP APPLIE ROT 10 11.4 M/L (STAPLE) ×2 IMPLANT
COVER MAYO STAND STRL (DRAPES) ×2 IMPLANT
COVER SURGICAL LIGHT HANDLE (MISCELLANEOUS) ×2 IMPLANT
DERMABOND ADVANCED .7 DNX12 (GAUZE/BANDAGES/DRESSINGS) ×2 IMPLANT
ELECT CAUTERY BLADE 6.4 (BLADE) ×2 IMPLANT
ELECT REM PT RETURN 9FT ADLT (ELECTROSURGICAL) ×1
ELECTRODE REM PT RTRN 9FT ADLT (ELECTROSURGICAL) ×2 IMPLANT
ENDOLOOP SUT PDS II 0 18 (SUTURE) IMPLANT
GAUZE 4X4 16PLY ~~LOC~~+RFID DBL (SPONGE) ×2 IMPLANT
GAUZE PAD ABD 8X10 STRL (GAUZE/BANDAGES/DRESSINGS) ×2 IMPLANT
GAUZE SPONGE 4X4 12PLY STRL (GAUZE/BANDAGES/DRESSINGS) ×2 IMPLANT
GLOVE BIO SURGEON STRL SZ7.5 (GLOVE) ×4 IMPLANT
GLOVE BIOGEL PI IND STRL 8 (GLOVE) ×2 IMPLANT
GOWN STRL REUS W/ TWL LRG LVL3 (GOWN DISPOSABLE) ×4 IMPLANT
GOWN STRL REUS W/ TWL XL LVL3 (GOWN DISPOSABLE) ×2 IMPLANT
GOWN STRL REUS W/TWL LRG LVL3 (GOWN DISPOSABLE) ×2
GOWN STRL REUS W/TWL XL LVL3 (GOWN DISPOSABLE) ×1
GRASPER SUT TROCAR 14GX15 (MISCELLANEOUS) ×2 IMPLANT
IRRIG SUCT STRYKERFLOW 2 WTIP (MISCELLANEOUS) ×1
IRRIGATION SUCT STRKRFLW 2 WTP (MISCELLANEOUS) ×2 IMPLANT
KIT BASIN OR (CUSTOM PROCEDURE TRAY) ×2 IMPLANT
KIT IMAGING PINPOINTPAQ (MISCELLANEOUS) IMPLANT
KIT TURNOVER KIT B (KITS) ×2 IMPLANT
LOOP VASCLR MAXI BLUE 18IN ST (MISCELLANEOUS) IMPLANT
LOOP VASCULAR MAXI 18 BLUE (MISCELLANEOUS)
LOOPS VASCLR MAXI BLUE 18IN ST (MISCELLANEOUS) IMPLANT
NDL 22X1.5 STRL (OR ONLY) (MISCELLANEOUS) ×2 IMPLANT
NDL HYPO 25GX1X1/2 BEV (NEEDLE) ×2 IMPLANT
NDL INSUFFLATION 14GA 120MM (NEEDLE) ×2 IMPLANT
NEEDLE 22X1.5 STRL (OR ONLY) (MISCELLANEOUS) ×1 IMPLANT
NEEDLE HYPO 25GX1X1/2 BEV (NEEDLE) ×1 IMPLANT
NEEDLE INSUFFLATION 14GA 120MM (NEEDLE) ×1 IMPLANT
NS IRRIG 1000ML POUR BTL (IV SOLUTION) ×2 IMPLANT
PACK LITHOTOMY IV (CUSTOM PROCEDURE TRAY) ×2 IMPLANT
PAD ARMBOARD 7.5X6 YLW CONV (MISCELLANEOUS) ×2 IMPLANT
PENCIL BUTTON HOLSTER BLD 10FT (ELECTRODE) ×2 IMPLANT
POUCH RETRIEVAL ECOSAC 10 (ENDOMECHANICALS) ×2 IMPLANT
SCISSORS LAP 5X35 DISP (ENDOMECHANICALS) ×2 IMPLANT
SET TUBE SMOKE EVAC HIGH FLOW (TUBING) ×2 IMPLANT
SHEARS HARMONIC 9CM CVD (BLADE) IMPLANT
SLEEVE Z-THREAD 5X100MM (TROCAR) ×4 IMPLANT
SPECIMEN JAR SMALL (MISCELLANEOUS) IMPLANT
SPIKE FLUID TRANSFER (MISCELLANEOUS) ×2 IMPLANT
SPONGE HEMORRHOID 8X3CM (HEMOSTASIS) IMPLANT
SURGILUBE 2OZ TUBE FLIPTOP (MISCELLANEOUS) ×2 IMPLANT
SUT CHROMIC 2 0 SH (SUTURE) IMPLANT
SUT CHROMIC 3 0 SH 27 (SUTURE) IMPLANT
SUT MNCRL AB 4-0 PS2 18 (SUTURE) ×2 IMPLANT
SYR BULB EAR ULCER 3OZ GRN STR (SYRINGE) IMPLANT
SYR CONTROL 10ML LL (SYRINGE) ×2 IMPLANT
TOWEL GREEN STERILE (TOWEL DISPOSABLE) ×2 IMPLANT
TOWEL GREEN STERILE FF (TOWEL DISPOSABLE) ×2 IMPLANT
TRAY LAPAROSCOPIC MC (CUSTOM PROCEDURE TRAY) ×2 IMPLANT
TROCAR 11X100 Z THREAD (TROCAR) ×2 IMPLANT
TROCAR Z-THREAD OPTICAL 5X100M (TROCAR) ×2 IMPLANT
TUBE CONNECTING 12X1/4 (SUCTIONS) ×2 IMPLANT
VASCULAR TIE MAXI BLUE 18IN ST (MISCELLANEOUS)
WARMER LAPAROSCOPE (MISCELLANEOUS) ×2 IMPLANT
WATER STERILE IRR 1000ML POUR (IV SOLUTION) ×2 IMPLANT
YANKAUER SUCT BULB TIP NO VENT (SUCTIONS) ×2 IMPLANT

## 2023-07-20 NOTE — Op Note (Signed)
Patient: [name] ([birth year], [id number])  Date of Surgery: 07/20/23  Preoperative Diagnosis: Cholecystitis, fecal impaction   Postoperative Diagnosis: Cholecystitis  Surgical Procedure: LAPAROSCOPIC CHOLECYSTECTOMY:   Exam under anesthesia  Operative Team Members:  Surgeons and Role:    * , Hyman Hopes, MD - Primary   Anesthesiologist: [name], MD CRNA: Kayleen Memos, CRNA   Anesthesia: General   Fluids:  Total I/O In: 600 [P.O.:600] Out: -   Complications: None  Drains:  none   Specimen:  ID Type Source Tests Collected by Time Destination  1 : GALLBLADDER Tissue PATH Gallbladder SURGICAL PATHOLOGY , Hyman Hopes, MD 07/20/2023 1636      Disposition:  PACU - hemodynamically stable.  Plan of Care:  Continue inpatient care    Indications for Procedure: [name] is a 77 y.o. female who presented with abdominal pain.  History, physical and imaging was concerning for cholecystitis and fecal impaction.  Laparoscopic cholecystectomy and fecal disimpaction was recommended for the patient.  The procedure itself, as well as the risks, benefits and alternatives were discussed with the patient.  Risks discussed included but were not limited to the risk of infection, bleeding, damage to nearby structures, need to convert to open procedure, incisional hernia, bile leak, common bile duct injury and the need for additional procedures or surgeries.  With this discussion complete and all questions answered the patient granted consent to proceed.  Findings: Gallstone  Infection status: Patient: [name] Emergency General Surgery Service Patient Case: Urgent Infection Present At Time Of Surgery (PATOS):  Inflamed gallbladder   Description of Procedure:   On the date stated above, the patient was taken to the operating room suite and placed in supine positioning.  Sequential compression devices were placed on the lower extremities to prevent  blood clots.  General endotracheal anesthesia was induced. Preoperative antibiotics were given.  The patient's abdomen was prepped and draped in the usual sterile fashion.  A time-out was completed verifying the correct patient, procedure, positioning and equipment needed for the case.  We began by anesthetizing the skin with local anesthetic and then making a 5 mm incision just below the umbilicus.  We dissected through the subcutaneous tissues to the fascia.  The fascia was grasped and elevated using a Kocher clamp.  A Veress needle was inserted into the abdomen and the abdomen was insufflated to 15 mmHg.  A 5 mm trocar was inserted in this position under optical guidance and then the abdomen was inspected.  There was no trauma to the underlying viscera with initial trocar placement.  Any abnormal findings, other than inflammation in the right upper quadrant, are listed above in the findings section.  Three additional trocars were placed, one 12 mm trocar in the subxiphoid position, one 5 mm trocar in the midline epigastric area and one 5mm trocar in the right upper quadrant subcostally.  These were placed under direct vision without any trauma to the underlying viscera.    The patient was then placed in head up, left side down positioning.  The gallbladder was identified and dissected free from its attachments to the omentum allowing the duodenum to fall away.  The infundibulum of the gallbladder was dissected free working laterally to medially.  The cystic duct and cystic artery were dissected free from surrounding connective tissue.  The infundibulum of the gallbladder was dissected off the cystic plate.  A critical view of safety was obtained with the cystic duct and cystic artery being cleared  of connective tissues and clearly the only two structures entering into the gallbladder with the liver clearly visible behind.  Clips were then applied to the cystic duct and cystic artery and then these structures  were divided.  A PDS endoloop was placed on the cystic duct stump. The gallbladder was dissected off the cystic plate, placed in an endocatch bag and removed from the 12 mm subxiphoid port site.  The clips were inspected and appeared effective.  The cystic plate was inspected and hemostasis was obtained using electrocautery.  A suction irrigator was used to clean the operative field.  Attention was turned to closure.  The 12 mm subxiphoid port site was closed using a 0-vicryl suture on a fascial suture passer.  The abdomen was desufflated.  The skin was closed using 4-0 monocryl and dermabond.  All sponge and needle counts were correct at the conclusion of the case.  At the conclusion of the cholecystectomy a rectal exam was performed.  There was no palpable stool ball or abnormality so no disimpaction was performed.   Ivar Drape, MD General, Bariatric, & Minimally Invasive Surgery [facility], [place]

## 2023-07-20 NOTE — Progress Notes (Signed)
Echocardiogram 2D Echocardiogram has been performed.  Irish Lack, RDCS 07/20/2023, 12:50 PM

## 2023-07-20 NOTE — Progress Notes (Signed)
PHARMACY - PHYSICIAN COMMUNICATION CRITICAL VALUE ALERT - BLOOD CULTURE IDENTIFICATION (BCID)  [name] is an 77 y.o. female who presented to [facility] on 07/19/2023 with a chief complaint of back pain, N/V  Assessment:  35 YOF with hx MS, neurogenic bladder found to have acute cholecysitis, concern for asp PNA. Now found to have 3 of 4 bottles growing GNR with BCID detecting Proteus species - most likely from intra-abd source.   Name of physician (or Provider) Contacted: Thedore Mins  Current antibiotics: Zosyn  Changes to prescribed antibiotics recommended:  Narrow to Rocephin 2g IV every 24 hours + Flagyl 500 mg po every 12 hours  Results for orders placed or performed during the hospital encounter of 07/19/23  Blood Culture ID Panel (Reflexed) (Collected: 07/19/2023  6:10 PM)  Result Value Ref Range   Enterococcus faecalis NOT DETECTED NOT DETECTED   Enterococcus Faecium NOT DETECTED NOT DETECTED   Listeria monocytogenes NOT DETECTED NOT DETECTED   Staphylococcus species NOT DETECTED NOT DETECTED   Staphylococcus aureus (BCID) NOT DETECTED NOT DETECTED   Staphylococcus epidermidis NOT DETECTED NOT DETECTED   Staphylococcus lugdunensis NOT DETECTED NOT DETECTED   Streptococcus species NOT DETECTED NOT DETECTED   Streptococcus agalactiae NOT DETECTED NOT DETECTED   Streptococcus pneumoniae NOT DETECTED NOT DETECTED   Streptococcus pyogenes NOT DETECTED NOT DETECTED   A.calcoaceticus-baumannii NOT DETECTED NOT DETECTED   Bacteroides fragilis NOT DETECTED NOT DETECTED   Enterobacterales DETECTED (A) NOT DETECTED   Enterobacter cloacae complex NOT DETECTED NOT DETECTED   Escherichia coli NOT DETECTED NOT DETECTED   Klebsiella aerogenes NOT DETECTED NOT DETECTED   Klebsiella oxytoca NOT DETECTED NOT DETECTED   Klebsiella pneumoniae NOT DETECTED NOT DETECTED   Proteus species DETECTED (A) NOT DETECTED   Salmonella species NOT DETECTED NOT DETECTED   Serratia marcescens NOT DETECTED  NOT DETECTED   Haemophilus influenzae NOT DETECTED NOT DETECTED   Neisseria meningitidis NOT DETECTED NOT DETECTED   Pseudomonas aeruginosa NOT DETECTED NOT DETECTED   Stenotrophomonas maltophilia NOT DETECTED NOT DETECTED   Candida albicans NOT DETECTED NOT DETECTED   Candida auris NOT DETECTED NOT DETECTED   Candida glabrata NOT DETECTED NOT DETECTED   Candida krusei NOT DETECTED NOT DETECTED   Candida parapsilosis NOT DETECTED NOT DETECTED   Candida tropicalis NOT DETECTED NOT DETECTED   Cryptococcus neoformans/gattii NOT DETECTED NOT DETECTED   CTX-M ESBL NOT DETECTED NOT DETECTED   Carbapenem resistance IMP NOT DETECTED NOT DETECTED   Carbapenem resistance KPC NOT DETECTED NOT DETECTED   Carbapenem resistance NDM NOT DETECTED NOT DETECTED   Carbapenem resist OXA 48 LIKE NOT DETECTED NOT DETECTED   Carbapenem resistance VIM NOT DETECTED NOT DETECTED    Thank you for allowing pharmacy to be a part of this patient's care.  Georgina Pillion, PharmD, BCPS, BCIDP Infectious Diseases Clinical Pharmacist 07/20/2023 11:24 AM   **Pharmacist phone directory can now be found on [email or web address] ([email or web address]).  Listed under [facility].

## 2023-07-20 NOTE — Progress Notes (Addendum)
PROGRESS NOTE                                                                                                                                                                                                             Patient Demographics:    [name], is a 77 y.o. female, DOB - 1946/10/23, QIO:[id number]  Outpatient Primary MD for the patient is Kimberly Schwalbe, MD    LOS - 0  Admit date - 07/19/2023    Chief Complaint  Patient presents with   Nausea   Emesis   Back Pain   Chest Pain       Brief Narrative (HPI from H&P)    77 y.o. female with medical history significant of MS, paraplegia, neurogenic bladder/bowel, spasticity, hyperlipidemia, BPPV, cervical spinal stenosis presenting with multiple complaints including nausea, vomiting, chest pain, back pain.  Consistent with sepsis due to acute cholecystitis she was admitted to the hospital, seen by general surgery.   Subjective:    [name] today has, No headache, No chest pain, mild abdominal and some radiating back pain as well, No new weakness tingling or numbness, no SOB.   Assessment  & Plan :    Sepsis present on admission due to acute cholecystitis.  General surgery on board, CT noted, bowel rest, IV fluids and IV Zosyn, sepsis pathophysiology stabilizing, defer management of this issue to general surgery.  Discussed with patient's son as well on 07/20/2023.  Acute hypoxic respiratory failure at the time of admission.  Currently chest pain-free stable on 3 L nasal cannula oxygen, to be increased effort due to sepsis, nausea vomiting and possible mild aspiration pneumonia, Zosyn suffice, D-dimer is extremely elevated which likely is due to cholecystitis however prudent to rule out PE, allergic to IV dye hence VQ scan stat on 07/20/2023 along with lower extremity venous duplex and echocardiogram.  HX of MS, cervical stenosis, chronic functional paraplegia,  neurogenic bladder, chronic generalized weakness worse in bilateral lower extremities along with chronic lower extremity contractures due to multiple different musculoskeletal issues according to the patient, largely bed and wheelchair-bound for the last 15 years.  Supportive care.  Hypokalemia.  Replace.   Addendum, lower extremity bilateral venous duplex negative, VQ scan unremarkable, echo with preserved EF of 50 to 55% and no wall motion abnormality, currently no chest pain or  shortness of breath.  Requested general surgery to proceed with cholecystectomy, discussed with son patient is moderate to high risk for adverse cardiopulmonary outcome, patient and family accept the risk and want to proceed with surgery.  Okay for perioperative blood transfusion, type screen done.  Patient runs a baseline systolic blood pressure of 100-105 according to the patient, she is symptom-free, she may require temporary pressor support during surgery, PCCM will be on board as well.      Condition - Extremely Guarded  Family Communication  :  son [name] [phone]  - 07/20/23  Code Status :  Full  Consults  :  CCS, PCCM  PUD Prophylaxis : PPI   Procedures  :     VQ.  Low probability  TTE - 1. Left ventricular ejection fraction, by estimation, is 50 to 55%. The left ventricle has low normal function. The left ventricle has no regional wall motion abnormalities. Left ventricular diastolic parameters are consistent with Grade II diastolic dysfunction (pseudonormalization).  2. Right ventricular systolic function is normal. The right ventricular size is normal.  3. The mitral valve was not well visualized. No evidence of mitral valve regurgitation.  4. The aortic valve is tricuspid. Aortic valve regurgitation is not visualized.  5. The inferior vena cava is normal in size with greater than 50% respiratory variability, suggesting right atrial pressure of 3 mmHg  Leg [place] no DVT  CT scan abdomen pelvis.  1.  Dependent collapse/consolidation in the lung bases, left greater than right. Imaging features to likely related to atelectasis although pneumonia not entirely excluded. 2. Small to moderate hiatal hernia. 3. Cholelithiasis. 4. 7 mm nonobstructing stone lower pole left kidney. 5. Large stool volume in the rectum with distention up to 7 cm diameter. 6.  Aortic Atherosclerosis (ICD10-I70.0).  RUQ [place] -positive Murphy sign      Disposition Plan  :    Status is: Observation  DVT Prophylaxis  :  Heparin   Lab Results  Component Value Date   PLT 271 07/20/2023    Diet :  Diet Order             Diet clear liquid Room service appropriate? Yes; Fluid consistency: Thin  Diet effective now                    Inpatient Medications  Scheduled Meds:  heparin injection (subcutaneous)  5,000 Units Subcutaneous Q8H   metroNIDAZOLE  500 mg Oral Q12H   midodrine  10 mg Oral TID WC   ondansetron (ZOFRAN) IV  4 mg Intravenous Once   pantoprazole (PROTONIX) IV  40 mg Intravenous Q24H   sodium chloride flush  3 mL Intravenous Q12H   Continuous Infusions:  sodium chloride 100 mL/hr at 07/20/23 1356   cefTRIAXone (ROCEPHIN)  IV     PRN Meds:.acetaminophen **OR** acetaminophen, polyethylene glycol  Antibiotics  :    Anti-infectives (From admission, onward)    Start     Dose/Rate Route Frequency Ordered Stop   07/20/23 2200  metroNIDAZOLE (FLAGYL) tablet 500 mg        500 mg Oral Every 12 hours 07/20/23 1153     07/20/23 1600  cefTRIAXone (ROCEPHIN) 2 g in sodium chloride 0.9 % 100 mL IVPB        2 g 200 mL/hr over 30 Minutes Intravenous Every 24 hours 07/20/23 1153     07/20/23 0200  piperacillin-tazobactam (ZOSYN) IVPB 3.375 g  Status:  Discontinued  Placed in "Followed by" Linked Group   3.375 g 12.5 mL/hr over 240 Minutes Intravenous Every 8 hours 07/19/23 1834 07/20/23 1153   07/19/23 1845  piperacillin-tazobactam (ZOSYN) IVPB 3.375 g       Placed in "Followed by" Linked  Group   3.375 g 100 mL/hr over 30 Minutes Intravenous  Once 07/19/23 1834 07/19/23 1954   07/19/23 1715  piperacillin-tazobactam (ZOSYN) IVPB 3.375 g  Status:  Discontinued        3.375 g 100 mL/hr over 30 Minutes Intravenous  Once 07/19/23 1701 07/19/23 1834         Objective:   Vitals:   07/20/23 0700 07/20/23 0850 07/20/23 1200 07/20/23 1300  BP: 95/65 (!) 82/48 (!) 97/59   Pulse:  79    Resp:  (!) 21 (!) 22 14  Temp:  99.1 F (37.3 C)    TempSrc:  Oral    SpO2:      Weight:      Height:        Wt Readings from Last 3 Encounters:  07/19/23 63.1 kg  10/07/22 59.9 kg  07/09/22 54.4 kg     Intake/Output Summary (Last 24 hours) at 07/20/2023 1420 Last data filed at 07/20/2023 0600 Gross per 24 hour  Intake 3102.41 ml  Output --  Net 3102.41 ml     Physical Exam  Awake Alert, No new F.N deficits, chronic weakness worse in bilateral lower extremities, largely bed and wheelchair-bound for 15 years, chronic lower extremity contractures [place].AT,PERRAL Supple Neck, No JVD,   Symmetrical Chest wall movement, Good air movement bilaterally, CTAB RRR,No Gallops,Rubs or new Murmurs,  +ve B.Sounds, Abd Soft, mild generalized abdominal tenderness mostly in the right upper quadrant, No Cyanosis, Clubbing or edema        Data Review:    Recent Labs  Lab 07/19/23 1204 07/20/23 0425  WBC 23.1* 39.4*  HGB 14.3 12.2  HCT 44.3 37.2  PLT 412* 271  MCV 93.5 90.7  MCH 30.2 29.8  MCHC 32.3 32.8  RDW 12.4 12.7  LYMPHSABS 0.4*  --   MONOABS 0.1  --   EOSABS 0.0  --   BASOSABS 0.0  --     Recent Labs  Lab 07/19/23 1204 07/19/23 1342 07/20/23 0425 07/20/23 0602 07/20/23 0657  NA 141  --  137  --   --   K 4.4  --  3.4*  --   --   CL 108  --  107  --   --   CO2 18*  --  20*  --   --   ANIONGAP 15  --  10  --   --   GLUCOSE 128*  --  108*  --   --   BUN 19  --  18  --   --   CREATININE 0.73  --  0.88  --   --   AST 53*  --  36  --   --   ALT 26  --  26  --   --    ALKPHOS 108  --  91  --   --   BILITOT 1.7*  --  1.1  --   --   ALBUMIN 3.3*  --  2.5*  --   --   CRP  --   --   --   --  17.7*  DDIMER  --  >20.00*  --   --   --   PROCALCITON  --   --   --  57.93  --   LATICACIDVEN  --   --   --  2.5*  --   TSH  --   --  0.959  --   --   CALCIUM 9.0  --  8.0*  --   --       Recent Labs  Lab 07/19/23 1204 07/19/23 1342 07/20/23 0425 07/20/23 0602 07/20/23 0657  CRP  --   --   --   --  17.7*  DDIMER  --  >20.00*  --   --   --   PROCALCITON  --   --   --  57.93  --   LATICACIDVEN  --   --   --  2.5*  --   TSH  --   --  0.959  --   --   CALCIUM 9.0  --  8.0*  --   --     Recent Labs    07/20/23 0425  TSH 0.959     Micro Results Recent Results (from the past 240 hour(s))  Resp panel by RT-PCR (RSV, Flu A&B, Covid) Anterior Nasal Swab     Status: None   Collection Time: 07/19/23  3:25 PM   Specimen: Anterior Nasal Swab  Result Value Ref Range Status   SARS Coronavirus 2 by RT PCR NEGATIVE NEGATIVE Final   Influenza A by PCR NEGATIVE NEGATIVE Final   Influenza B by PCR NEGATIVE NEGATIVE Final    Comment: (NOTE) The Xpert Xpress SARS-CoV-2/FLU/RSV plus assay is intended as an aid in the diagnosis of influenza from Nasopharyngeal swab specimens and should not be used as a sole basis for treatment. Nasal washings and aspirates are unacceptable for Xpert Xpress SARS-CoV-2/FLU/RSV testing.  Fact Sheet for Patients: BloggerCourse.com  Fact Sheet for Healthcare Providers: SeriousBroker.it  This test is not yet approved or cleared by the [place] FDA and has been authorized for detection and/or diagnosis of SARS-CoV-2 by FDA under an Emergency Use Authorization (EUA). This EUA will remain in effect (meaning this test can be used) for the duration of the COVID-19 declaration under Section 564(b)(1) of the Act, 21 U.S.C. section 360bbb-3(b)(1), unless the authorization is terminated  or revoked.     Resp Syncytial Virus by PCR NEGATIVE NEGATIVE Final    Comment: (NOTE) Fact Sheet for Patients: BloggerCourse.com  Fact Sheet for Healthcare Providers: SeriousBroker.it  This test is not yet approved or cleared by the [place] FDA and has been authorized for detection and/or diagnosis of SARS-CoV-2 by FDA under an Emergency Use Authorization (EUA). This EUA will remain in effect (meaning this test can be used) for the duration of the COVID-19 declaration under Section 564(b)(1) of the Act, 21 U.S.C. section 360bbb-3(b)(1), unless the authorization is terminated or revoked.  Performed at [facility], [address], [place], [place] [address]   Blood culture (routine x 2)     Status: None (Preliminary result)   Collection Time: 07/19/23  6:10 PM   Specimen: BLOOD  Result Value Ref Range Status   Specimen Description BLOOD BLOOD RIGHT ARM  Final   Special Requests   Final    BOTTLES DRAWN AEROBIC AND ANAEROBIC Blood Culture results may not be optimal due to an inadequate volume of blood received in culture bottles   Culture  Setup Time   Final    GRAM NEGATIVE RODS IN BOTH AEROBIC AND ANAEROBIC BOTTLES CRITICAL RESULT CALLED TO, READ BACK BY AND VERIFIED WITHAntoine Primas PHARMD, AT 1123 07/20/23  D. VANHOOK Performed at [facility], [address] [place], [place] [address]    Culture GRAM NEGATIVE RODS  Final   Report Status PENDING  Incomplete  Blood culture (routine x 2)     Status: None (Preliminary result)   Collection Time: 07/19/23  6:10 PM   Specimen: BLOOD  Result Value Ref Range Status   Specimen Description BLOOD BLOOD RIGHT ARM  Final   Special Requests   Final    BOTTLES DRAWN AEROBIC AND ANAEROBIC Blood Culture results may not be optimal due to an inadequate volume of blood received in culture bottles   Culture  Setup Time   Final    GRAM NEGATIVE RODS IN BOTH AEROBIC AND  ANAEROBIC BOTTLES Organism ID to follow CRITICAL RESULT CALLED TO, READ BACK BY AND VERIFIED WITHAntoine Primas PHARMD, AT 1114 07/20/23 Renato Shin Performed at [facility], [address] [place], [place] [address]    Culture GRAM NEGATIVE RODS  Final   Report Status PENDING  Incomplete  Blood Culture ID Panel (Reflexed)     Status: Abnormal   Collection Time: 07/19/23  6:10 PM  Result Value Ref Range Status   Enterococcus faecalis NOT DETECTED NOT DETECTED Final   Enterococcus Faecium NOT DETECTED NOT DETECTED Final   Listeria monocytogenes NOT DETECTED NOT DETECTED Final   Staphylococcus species NOT DETECTED NOT DETECTED Final   Staphylococcus aureus (BCID) NOT DETECTED NOT DETECTED Final   Staphylococcus epidermidis NOT DETECTED NOT DETECTED Final   Staphylococcus lugdunensis NOT DETECTED NOT DETECTED Final   Streptococcus species NOT DETECTED NOT DETECTED Final   Streptococcus agalactiae NOT DETECTED NOT DETECTED Final   Streptococcus pneumoniae NOT DETECTED NOT DETECTED Final   Streptococcus pyogenes NOT DETECTED NOT DETECTED Final   A.calcoaceticus-baumannii NOT DETECTED NOT DETECTED Final   Bacteroides fragilis NOT DETECTED NOT DETECTED Final   Enterobacterales DETECTED (A) NOT DETECTED Final    Comment: Enterobacterales represent a large order of gram negative bacteria, not a single organism. CRITICAL RESULT CALLED TO, READ BACK BY AND VERIFIED WITH: E. MARTIN PHARMD, AT 1114 07/20/23 D. VANHOOK    Enterobacter cloacae complex NOT DETECTED NOT DETECTED Final   Escherichia coli NOT DETECTED NOT DETECTED Final   Klebsiella aerogenes NOT DETECTED NOT DETECTED Final   Klebsiella oxytoca NOT DETECTED NOT DETECTED Final   Klebsiella pneumoniae NOT DETECTED NOT DETECTED Final   Proteus species DETECTED (A) NOT DETECTED Final    Comment: CRITICAL RESULT CALLED TO, READ BACK BY AND VERIFIED WITH: E. MARTIN PHARMD, AT 1114 07/20/23 D. VANHOOK    Salmonella species NOT DETECTED NOT  DETECTED Final   Serratia marcescens NOT DETECTED NOT DETECTED Final   Haemophilus influenzae NOT DETECTED NOT DETECTED Final   Neisseria meningitidis NOT DETECTED NOT DETECTED Final   Pseudomonas aeruginosa NOT DETECTED NOT DETECTED Final   Stenotrophomonas maltophilia NOT DETECTED NOT DETECTED Final   Candida albicans NOT DETECTED NOT DETECTED Final   Candida auris NOT DETECTED NOT DETECTED Final   Candida glabrata NOT DETECTED NOT DETECTED Final   Candida krusei NOT DETECTED NOT DETECTED Final   Candida parapsilosis NOT DETECTED NOT DETECTED Final   Candida tropicalis NOT DETECTED NOT DETECTED Final   Cryptococcus neoformans/gattii NOT DETECTED NOT DETECTED Final   CTX-M ESBL NOT DETECTED NOT DETECTED Final   Carbapenem resistance IMP NOT DETECTED NOT DETECTED Final   Carbapenem resistance KPC NOT DETECTED NOT DETECTED Final   Carbapenem resistance NDM NOT DETECTED NOT DETECTED  Final   Carbapenem resist OXA 48 LIKE NOT DETECTED NOT DETECTED Final   Carbapenem resistance VIM NOT DETECTED NOT DETECTED Final    Comment: Performed at [facility], [address] [place], [place] [address]  MRSA Next Gen by PCR, Nasal     Status: None   Collection Time: 07/19/23  9:05 PM   Specimen: Nasal Mucosa; Nasal Swab  Result Value Ref Range Status   MRSA by PCR Next Gen NOT DETECTED NOT DETECTED Final    Comment: (NOTE) The GeneXpert MRSA Assay (FDA approved for NASAL specimens only), is one component of a comprehensive MRSA colonization surveillance program. It is not intended to diagnose MRSA infection nor to guide or monitor treatment for MRSA infections. Test performance is not FDA approved in patients less than 45 years old. Performed at [facility], [address], [place], [place] [address]     Radiology Reports ECHOCARDIOGRAM COMPLETE  Result Date: 07/20/2023    ECHOCARDIOGRAM REPORT   Patient Name:   AEJA GARNEY Date of Exam: 07/20/2023 Medical Rec #:  [id number]         Height:       63.0 in Accession #:    [id number]       Weight:       139.1 lb Date of Birth:  [birth year]        BSA:          1.657 m Patient Age:    77 years         BP:           95/65 mmHg Patient Gender: F                HR:           65 bpm. Exam Location:  Inpatient Procedure: 2D Echo, Color Doppler and Cardiac Doppler Indications:    CHF- Acute Diastolic  History:        Patient has no prior history of Echocardiogram examinations.                 Mitral Valve Prolapse.  Sonographer:    Raeford Razor Referring Phys: Stanford Scotland [facility]  Sonographer Comments: Image acquisition challenging due to patient body habitus. IMPRESSIONS  1. Left ventricular ejection fraction, by estimation, is 50 to 55%. The left ventricle has low normal function. The left ventricle has no regional wall motion abnormalities. Left ventricular diastolic parameters are consistent with Grade II diastolic dysfunction (pseudonormalization).  2. Right ventricular systolic function is normal. The right ventricular size is normal.  3. The mitral valve was not well visualized. No evidence of mitral valve regurgitation.  4. The aortic valve is tricuspid. Aortic valve regurgitation is not visualized.  5. The inferior vena cava is normal in size with greater than 50% respiratory variability, suggesting right atrial pressure of 3 mmHg. FINDINGS  Left Ventricle: Left ventricular ejection fraction, by estimation, is 50 to 55%. The left ventricle has low normal function. The left ventricle has no regional wall motion abnormalities. The left ventricular internal cavity size was normal in size. There is no left ventricular hypertrophy. Left ventricular diastolic parameters are consistent with Grade II diastolic dysfunction (pseudonormalization). Right Ventricle: The right ventricular size is normal. Right ventricular systolic function is normal. Left Atrium: Left atrial size was normal in size. Right Atrium: Right atrial size was normal in size.  Pericardium: There is no evidence of pericardial effusion. Mitral Valve: The mitral valve was not  well visualized. No evidence of mitral valve regurgitation. Tricuspid Valve: The tricuspid valve is normal in structure. Tricuspid valve regurgitation is mild. Aortic Valve: The aortic valve is tricuspid. Aortic valve regurgitation is not visualized. Aortic valve mean gradient measures 3.0 mmHg. Aortic valve peak gradient measures 5.7 mmHg. Aortic valve area, by VTI measures 3.67 cm. Pulmonic Valve: The pulmonic valve was not well visualized. Pulmonic valve regurgitation is not visualized. Aorta: The aortic root and ascending aorta are structurally normal, with no evidence of dilitation. Venous: The inferior vena cava is normal in size with greater than 50% respiratory variability, suggesting right atrial pressure of 3 mmHg. IAS/Shunts: No atrial level shunt detected by color flow Doppler.  LEFT VENTRICLE PLAX 2D LVIDd:         3.90 cm     Diastology LVIDs:         2.90 cm     LV e' medial:    7.83 cm/s LV PW:         0.90 cm     LV E/e' medial:  16.1 LV IVS:        0.90 cm     LV e' lateral:   10.80 cm/s LVOT diam:     2.20 cm     LV E/e' lateral: 11.7 LV SV:         94 LV SV Index:   56 LVOT Area:     3.80 cm  LV Volumes (MOD) LV vol d, MOD A2C: 49.2 ml LV vol d, MOD A4C: 53.1 ml LV vol s, MOD A2C: 24.4 ml LV vol s, MOD A4C: 26.4 ml LV SV MOD A2C:     24.8 ml LV SV MOD A4C:     53.1 ml LV SV MOD BP:      26.4 ml RIGHT VENTRICLE             IVC RV Basal diam:  2.40 cm     IVC diam: 2.00 cm RV S prime:     12.10 cm/s TAPSE (M-mode): 2.4 cm LEFT ATRIUM           Index        RIGHT ATRIUM          Index LA diam:      2.90 cm 1.75 cm/m   RA Area:     7.92 cm LA Vol (A2C): 16.8 ml 10.14 ml/m  RA Volume:   11.20 ml 6.76 ml/m LA Vol (A4C): 16.1 ml 9.72 ml/m  AORTIC VALVE AV Area (Vmax):    3.23 cm AV Area (Vmean):   3.39 cm AV Area (VTI):     3.67 cm AV Vmax:           119.00 cm/s AV Vmean:          81.400 cm/s AV  VTI:            0.255 m AV Peak Grad:      5.7 mmHg AV Mean Grad:      3.0 mmHg LVOT Vmax:         101.00 cm/s LVOT Vmean:        72.500 cm/s LVOT VTI:          0.246 m LVOT/AV VTI ratio: 0.96  AORTA Ao Asc diam: 3.00 cm MITRAL VALVE                TRICUSPID VALVE MV Area (PHT): 4.63 cm     TR Peak grad:   26.0 mmHg  MV Decel Time: 164 msec     TR Vmax:        255.00 cm/s MV E velocity: 126.00 cm/s MV A velocity: 88.80 cm/s   SHUNTS MV E/A ratio:  1.42         Systemic VTI:  0.25 m                             Systemic Diam: 2.20 cm Carolan Clines Electronically signed by Carolan Clines Signature Date/Time: 07/20/2023/1:00:16 PM    Final    NM Pulmonary Perfusion  Result Date: 07/20/2023 CLINICAL DATA:  Pulmonary embolism (PE) suspected, high prob EXAM: NUCLEAR MEDICINE PERFUSION LUNG SCAN TECHNIQUE: Perfusion images were obtained in multiple projections after intravenous injection of radiopharmaceutical. Ventilation scans intentionally deferred if perfusion scan and chest x-ray adequate for interpretation during COVID 19 epidemic. RADIOPHARMACEUTICALS:  4.1 mCi Tc-61m MAA IV COMPARISON:  Chest x-ray 07/20/2023 FINDINGS: Relatively homogeneous distribution of radiopharmaceutical within both lungs. No segmental or larger perfusion defect. IMPRESSION: Low probability for pulmonary embolism. Electronically Signed   By: Duanne Guess D.O.   On: 07/20/2023 12:25   VAS [place] LOWER EXTREMITY VENOUS (DVT)  Result Date: 07/20/2023  Lower Venous DVT Study Patient Name:  HUDA TINGEN  Date of Exam:   07/20/2023 Medical Rec #: [id number]         Accession #:    [id number] Date of Birth: Dec 20, 1945         Patient Gender: F Patient Age:   82 years Exam Location:  [facility] Procedure:      VAS [place] LOWER EXTREMITY VENOUS (DVT) Referring Phys: Lyn Hollingshead MELVIN --------------------------------------------------------------------------------  Other Indications: Positive D dimer. Risk Factors: Immobility None identified.  Comparison Study: No prior study Performing Technologist: Shona Simpson  Examination Guidelines: A complete evaluation includes B-mode imaging, spectral Doppler, color Doppler, and power Doppler as needed of all accessible portions of each vessel. Bilateral testing is considered an integral part of a complete examination. Limited examinations for reoccurring indications may be performed as noted. The reflux portion of the exam is performed with the patient in reverse Trendelenburg.  +---------+---------------+---------+-----------+----------+--------------+ RIGHT    CompressibilityPhasicitySpontaneityPropertiesThrombus Aging +---------+---------------+---------+-----------+----------+--------------+ CFV      Full           Yes      Yes                                 +---------+---------------+---------+-----------+----------+--------------+ SFJ      Full                                                        +---------+---------------+---------+-----------+----------+--------------+ FV Prox  Full                                                        +---------+---------------+---------+-----------+----------+--------------+ FV Mid   Full                                                        +---------+---------------+---------+-----------+----------+--------------+  FV DistalFull                                                        +---------+---------------+---------+-----------+----------+--------------+ PFV      Full                                                        +---------+---------------+---------+-----------+----------+--------------+ POP      Full           Yes      Yes                                 +---------+---------------+---------+-----------+----------+--------------+ PTV      Full                                                        +---------+---------------+---------+-----------+----------+--------------+ PERO     Full                                                         +---------+---------------+---------+-----------+----------+--------------+   +---------+---------------+---------+-----------+----------+--------------+ LEFT     CompressibilityPhasicitySpontaneityPropertiesThrombus Aging +---------+---------------+---------+-----------+----------+--------------+ CFV      Full           Yes      Yes                                 +---------+---------------+---------+-----------+----------+--------------+ SFJ      Full                                                        +---------+---------------+---------+-----------+----------+--------------+ FV Prox  Full                                                        +---------+---------------+---------+-----------+----------+--------------+ FV Mid   Full                                                        +---------+---------------+---------+-----------+----------+--------------+ FV DistalFull                                                        +---------+---------------+---------+-----------+----------+--------------+  PFV      Full                                                        +---------+---------------+---------+-----------+----------+--------------+ POP      Full           Yes      Yes                                 +---------+---------------+---------+-----------+----------+--------------+ PTV      Full                                                        +---------+---------------+---------+-----------+----------+--------------+ PERO     Full                                                        +---------+---------------+---------+-----------+----------+--------------+     Summary: BILATERAL: - No evidence of deep vein thrombosis seen in the lower extremities, bilaterally. -No evidence of popliteal cyst, bilaterally.   *See table(s) above for measurements and observations.     Preliminary    DG Chest Port 1 View  Result Date: 07/20/2023 CLINICAL DATA:  77 year old female with history of shortness of breath. EXAM: PORTABLE CHEST 1 VIEW COMPARISON:  Chest x-ray 07/19/2023. FINDINGS: Lung volumes are normal. No consolidative airspace disease. No pleural effusions. No pneumothorax. No pulmonary nodule or mass noted. Pulmonary vasculature and the cardiomediastinal silhouette are within normal limits. Atherosclerosis in the thoracic aorta. Status post right shoulder arthroplasty. Old healed right-sided rib fractures are incidentally noted. IMPRESSION: 1.  No radiographic evidence of acute cardiopulmonary disease. 2. Aortic atherosclerosis. Electronically Signed   By: Trudie Reed M.D.   On: 07/20/2023 06:34   CT CHEST ABDOMEN PELVIS WO CONTRAST  Result Date: 07/19/2023 CLINICAL DATA:  Chest pain hypoxia and cough. Nausea vomiting and abdominal pain. EXAM: CT CHEST, ABDOMEN AND PELVIS WITHOUT CONTRAST TECHNIQUE: Multidetector CT imaging of the chest, abdomen and pelvis was performed following the standard protocol without IV contrast. RADIATION DOSE REDUCTION: This exam was performed according to the departmental dose-optimization program which includes automated exposure control, adjustment of the mA and/or kV according to patient size and/or use of iterative reconstruction technique. COMPARISON:  CT urogram 09/08/2022 FINDINGS: CT CHEST FINDINGS Cardiovascular: The heart size is normal. No substantial pericardial effusion. Coronary artery calcification is evident. Mild atherosclerotic calcification is noted in the wall of the thoracic aorta. Mediastinum/Nodes: No mediastinal lymphadenopathy. No evidence for gross hilar lymphadenopathy although assessment is limited by the lack of intravenous contrast on the current study. Small to moderate hiatal hernia. The esophagus has normal imaging features. There is no axillary lymphadenopathy. Lungs/Pleura: Dependent collapse/consolidation  noted in the lung bases, left greater than right. No pulmonary edema or substantial pleural effusion. No suspicious pulmonary nodule or mass. Musculoskeletal: No worrisome lytic or sclerotic osseous abnormality. Right shoulder replacement CT ABDOMEN PELVIS FINDINGS Hepatobiliary: No suspicious focal abnormality  in the liver on this study without intravenous contrast. 7 mm calcified gallstone evident. No intrahepatic or extrahepatic biliary dilation. Pancreas: No focal mass lesion. No dilatation of the main duct. No intraparenchymal cyst. No peripancreatic edema. Spleen: No splenomegaly. No suspicious focal mass lesion. Adrenals/Urinary Tract: No adrenal nodule or mass. Right kidney and ureter unremarkable. 7 mm nonobstructing stone lower pole left kidney. No left ureteral stone. No left hydroureteronephrosis. The urinary bladder appears normal for the degree of distention. Stomach/Bowel: Small to moderate hiatal hernia. Stomach otherwise unremarkable. Duodenum is normally positioned as is the ligament of Treitz. No small bowel wall thickening. No small bowel dilatation. The terminal ileum is normal. The appendix is normal. Colon is decompressed except for the rectum which is distended up to 7 cm diameter with large stool volume evident. Vascular/Lymphatic: There is mild atherosclerotic calcification of the abdominal aorta without aneurysm. There is no gastrohepatic or hepatoduodenal ligament lymphadenopathy. No retroperitoneal or mesenteric lymphadenopathy. No pelvic sidewall lymphadenopathy. Reproductive: Hysterectomy.  There is no adnexal mass. Other: No intraperitoneal free fluid. Musculoskeletal: Bones are diffusely demineralized. Stable L5 compression deformity. IMPRESSION: 1. Dependent collapse/consolidation in the lung bases, left greater than right. Imaging features to likely related to atelectasis although pneumonia not entirely excluded. 2. Small to moderate hiatal hernia. 3. Cholelithiasis. 4. 7 mm  nonobstructing stone lower pole left kidney. 5. Large stool volume in the rectum with distention up to 7 cm diameter. 6.  Aortic Atherosclerosis (ICD10-I70.0). Electronically Signed   By: Kennith Center M.D.   On: 07/19/2023 16:39   US Abdomen Limited RUQ (LIVER/GB)  Result Date: 07/19/2023 CLINICAL DATA:  [id number] Epigastric abdominal pain [id number] EXAM: ULTRASOUND ABDOMEN LIMITED RIGHT UPPER QUADRANT COMPARISON:  CT chest 07/09/2022 FINDINGS: Gallbladder: Gallbladder wall thickening up to 6.6 mm. Nonmobile cyst 9 mm gallstone in the gallbladder neck. No pericholecystic fluid. Sonographer describes sonographic Murphy's sign. Common bile duct: Diameter: 2.6 mm.  No intrahepatic biliary ductal dilatation. Liver: No focal lesion identified. Within normal limits in parenchymal echogenicity. Portal vein is patent on color Doppler imaging with normal direction of blood flow towards the liver. Other: Technologist describes technically difficult study secondary to body habitus. IMPRESSION: Cholelithiasis with gallbladder wall thickening and positive sonographic Murphy's sign, suggesting acute cholecystitis. Electronically Signed   By: Corlis Leak M.D.   On: 07/19/2023 16:31   DG Chest Port 1 View  Result Date: 07/19/2023 CLINICAL DATA:  sob EXAM: PORTABLE CHEST 1 VIEW COMPARISON:  CXR 07/10/23 FINDINGS: Assessment of the bilateral upper lobes is limited due to patient positioning. Within this limitation, no pleural effusion. No pneumothorax. Linear opacity in the right lung base that appears unchanged from prior exam and most likely represents atelectasis. Unchanged cardiac and mediastinal contours. No new focal airspace opacity. No radiographically apparent displaced rib fractures. Visualized upper abdomen unremarkable. Right shoulder arthroplasty. IMPRESSION: Right basilar atelectasis. Electronically Signed   By: Lorenza Cambridge M.D.   On: 07/19/2023 13:33      Signature  -   Susa Raring M.D on 07/20/2023 at 2:20 PM   -   To page go to [email or web address]

## 2023-07-20 NOTE — Anesthesia Preprocedure Evaluation (Addendum)
Anesthesia Evaluation  Patient identified by MRN, date of birth, ID band Patient awake    Reviewed: Allergy & Precautions, NPO status , Patient's Chart, lab work & pertinent test results  History of Anesthesia Complications Negative for: history of anesthetic complications  Airway Mallampati: II  TM Distance: >3 FB Neck ROM: Full    Dental  (+) Missing,    Pulmonary neg pulmonary ROS   Pulmonary exam normal        Cardiovascular negative cardio ROS Normal cardiovascular exam  ECHO 8/5  1. Left ventricular ejection fraction, by estimation, is 50 to 55%. The  left ventricle has low normal function. The left ventricle has no regional  wall motion abnormalities. Left ventricular diastolic parameters are  consistent with Grade II diastolic  dysfunction (pseudonormalization).   2. Right ventricular systolic function is normal. The right ventricular  size is normal.   3. The mitral valve was not well visualized. No evidence of mitral valve  regurgitation.   4. The aortic valve is tricuspid. Aortic valve regurgitation is not  visualized.   5. The inferior vena cava is normal in size with greater than 50%  respiratory variability, suggesting right atrial pressure of 3 mmHg.     Neuro/Psych Multiple sclerosis (uses wheelchair)  negative psych ROS   GI/Hepatic negative GI ROS, Neg liver ROS,,,  Endo/Other  negative endocrine ROS    Renal/GU nephrolithiasis  negative genitourinary   Musculoskeletal  (+) Arthritis ,    Abdominal   Peds  Hematology negative hematology ROS (+)   Anesthesia Other Findings Day of surgery medications reviewed with patient.  Reproductive/Obstetrics negative OB ROS                              Anesthesia Physical Anesthesia Plan  ASA: 2  Anesthesia Plan: General   Post-op Pain Management: Ofirmev IV (intra-op)*   Induction: Intravenous  PONV Risk Score and  Plan: 3 and Treatment may vary due to age or medical condition, Ondansetron, Dexamethasone and Propofol infusion  Airway Management Planned: Oral ETT  Additional Equipment: None  Intra-op Plan:   Post-operative Plan: Extubation in OR  Informed Consent: I have reviewed the patients History and Physical, chart, labs and discussed the procedure including the risks, benefits and alternatives for the proposed anesthesia with the patient or authorized representative who has indicated his/her understanding and acceptance.     Dental advisory given  Plan Discussed with: CRNA and Anesthesiologist  Anesthesia Plan Comments: (Patient is 77 y.o. female presenting with multiple complaints including nausea, vomiting, chest pain, back pain. Consistent with sepsis due to acute cholecystitis she was admitted to the hospital, seen by general surgery. PMH significant of MS, paraplegia, largely bed and wheelchair-bound for 15 years, chronic lower extremity contractures, neurogenic bladder/bowel, spasticity, hyperlipidemia, BPPV, cervical spinal stenosis.)         Anesthesia Quick Evaluation

## 2023-07-20 NOTE — Consult Note (Signed)
NAME:  [name], MRN:  [id number], DOB:  [birth year], LOS: 0 ADMISSION DATE:  07/19/2023, CONSULTATION DATE:  07/20/2023  REFERRING MD:  Thedore Mins, TRH, CHIEF COMPLAINT: Abdominal pain  History of Present Illness:  77 year old woman with advanced multiple sclerosis and functional paraplegic BIBEMS from SNF with central intermittent chest pain, upper abdominal pain radiating to her back and nausea vomiting. She was hypoxic requiring 3 L nasal cannula.  There was a reported injury to back 2 weeks ago.  She is bedbound at baseline Initial labs significant for leukocytosis 23 K, CT chest/abdomen/pelvis showed minimal consolidation at the lung bases left more than right, large stool volume up to 7 cm in the colon, nonobstructive renal stones and gallbladder stones. Right upper quadrant ultrasound showed gallbladder thickening and positive Murphy sign.  She was admitted with presumptive diagnosis of acute cholecystitis Due to elevated D-dimer and hypoxia, she underwent VQ scan which was negative and bilateral venous duplex was negative. Echo showed normal LVEF 50 to 55%. Evaluated by general surgery and plan is to proceed with cholecystectomy.  She had hypotension responding to fluids and due to this PCCM consult is requested with the idea that she may need pressors postop  Pertinent  Medical History  Multiple sclerosis Functional paraplegia, neurogenic bladder Cervical spinal stenosis  Significant Hospital Events: Including procedures, antibiotic start and stop dates in addition to other pertinent events     Interim History / Subjective:  States pain is improved Afebrile 99% on 3 L nasal cannula  Objective   Blood pressure (!) 97/59, pulse 79, temperature 99.1 F (37.3 C), temperature source Oral, resp. rate 14, height 5\' 3"  (1.6 m), weight 63.1 kg, SpO2 97%.        Intake/Output Summary (Last 24 hours) at 07/20/2023 1512 Last data filed at 07/20/2023 1443 Gross per 24 hour  Intake  3702.41 ml  Output --  Net 3702.41 ml   Filed Weights   07/19/23 1209 07/19/23 2129  Weight: 59.9 kg 63.1 kg    Examination: General: Elderly woman lying supine on her left side, no distress HENT: Mild pallor, no icterus, no JVD Lungs: Clear breath sounds bilateral decreased at bases, no accessory muscle use Cardiovascular: S1-S2 regular, no murmur Abdomen: Soft, no guarding or tenderness or rebound Extremities: No deformity, no edema Neuro: Alert, interactive, flicker movement of both toes, paraplegic  Labs show mild hypokalemia, extreme leukocytosis Blood culture showing Proteus  Resolved Hospital Problem list     Assessment & Plan:  Severe sepsis Proteus bacteremia likely due to acute cholecystitis, urine has not been checked  -was on Zosyn, switched to ceftriaxone -Laparoscopic cholecystectomy planned. If she requires pressors postop, can transfer to ICU, okay to use Levophed peripherally up to 10 mics  Bowel impaction -manual disimpaction planned  Acute respiratory failure with hypoxia POA -pulm embolism ruled out Likely related to atelectasis/splinting from pain rather than actual pneumonia -Incentive spirometry  PCCM will follow along   Best Practice (right click and "Reselect all SmartList Selections" daily)   Diet/type: NPO DVT prophylaxis: LMWH GI prophylaxis: N/A Lines: N/A Foley:  N/A Code Status:  full code Last date of multidisciplinary goals of care discussion [NA]  Labs   CBC: Recent Labs  Lab 07/19/23 1204 07/20/23 0425  WBC 23.1* 39.4*  NEUTROABS 22.4*  --   HGB 14.3 12.2  HCT 44.3 37.2  MCV 93.5 90.7  PLT 412* 271    Basic Metabolic Panel: Recent Labs  Lab 07/19/23 1204 07/20/23 0425  NA 141 137  K 4.4 3.4*  CL 108 107  CO2 18* 20*  GLUCOSE 128* 108*  BUN 19 18  CREATININE 0.73 0.88  CALCIUM 9.0 8.0*   GFR: Estimated Creatinine Clearance: 47.9 mL/min (by C-G formula based on SCr of 0.88 mg/dL). Recent Labs  Lab  07/19/23 1204 07/20/23 0425 07/20/23 0602  PROCALCITON  --   --  57.93  WBC 23.1* 39.4*  --   LATICACIDVEN  --   --  2.5*    Liver Function Tests: Recent Labs  Lab 07/19/23 1204 07/20/23 0425  AST 53* 36  ALT 26 26  ALKPHOS 108 91  BILITOT 1.7* 1.1  PROT 6.5 5.4*  ALBUMIN 3.3* 2.5*   Recent Labs  Lab 07/19/23 1204  LIPASE 36   No results for input(s): "AMMONIA" in the last 168 hours.  ABG No results found for: "PHART", "PCO2ART", "PO2ART", "HCO3", "TCO2", "ACIDBASEDEF", "O2SAT"   Coagulation Profile: No results for input(s): "INR", "PROTIME" in the last 168 hours.  Cardiac Enzymes: No results for input(s): "CKTOTAL", "CKMB", "CKMBINDEX", "TROPONINI" in the last 168 hours.  HbA1C: No results found for: "HGBA1C"  CBG: No results for input(s): "GLUCAP" in the last 168 hours.  Review of Systems:   Constitutional: negative for anorexia, fevers and sweats  Eyes: negative for irritation, redness and visual disturbance  Ears, nose, mouth, throat, and face: negative for earaches, epistaxis, nasal congestion and sore throat  Respiratory: negative for cough,  sputum and wheezing  Cardiovascular: negative for   orthopnea, palpitations and syncope  Gastrointestinal: negative for  constipation, diarrhea, melena, nausea and vomiting  Genitourinary:negative for dysuria, frequency and hematuria  Hematologic/lymphatic: negative for bleeding, easy bruising and lymphadenopathy  Musculoskeletal:negative for arthralgias, muscle weakness and stiff joints  Neurological: negative for coordination problems, gait problems, headaches and weakness  Endocrine: negative for diabetic symptoms including polydipsia, polyuria and weight loss   Past Medical History:  She,  has a past medical history of Anemia, Ankle fracture, left (07/15/2006), Ankle fracture, right (10/30/2006), Compression fracture (12/15/2009), Disc herniation, History of kidney stones, MS (multiple sclerosis) (HCC),  Multiple sclerosis (HCC), MVP (mitral valve prolapse) (12/15/1977), Nephrolithiasis, Osteoporosis, Rib fractures (1998, 4010,2725), and Toe fracture (12/15/1996).   Surgical History:   Past Surgical History:  Procedure Laterality Date   CYSTOSCOPY/URETEROSCOPY/HOLMIUM LASER/STENT PLACEMENT Right 10/07/2022   Procedure: CYSTOSCOPY RIGHT RETROGRADE RIGHT URETEROSCOPY/HOLMIUM LASER/STENT PLACEMENT;  Surgeon: Bjorn Pippin, MD;  Location: [facility];  Service: Urology;  Laterality: Right;  1 HR FOR CASE   LUMBAR DISC SURGERY  04/15/2007   L5, S1- [facility]   OPEN REDUCTION INTERNAL FIXATION (ORIF) TIBIA/FIBULA FRACTURE Left 10/13/2020   Procedure: OPEN REDUCTION INTERNAL FIXATION (ORIF) TIBIA/FIBULA FRACTURE;  Surgeon: Roby Lofts, MD;  Location: [facility] OR;  Service: Orthopedics;  Laterality: Left;   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 She has never used smokeless tobacco. She reports that she does not drink alcohol and does not use drugs.   Family History:  Her family history includes Neuropathy in her brother;  Stroke in her father and mother. There is no history of Hypertension, Diabetes, Depression, Alcohol abuse, or Drug abuse.   Allergies Allergies  Allergen Reactions   Baclofen Diarrhea and Nausea And Vomiting   Iodinated Contrast Media Hives   Procaine Hcl     UNSPECIFIED     Home Medications  Prior to Admission medications   Medication Sig Start Date End Date Taking? Authorizing Provider  methenamine (HIPREX) 1 g tablet TAKE 1 TABLET BY  MOUTH EVERY OTHER DAY 09/17/22   Karie Schwalbe, MD      Cyril Mourning MD. FCCP. [facility] Pager : [phone]  If no response to pager , please call [phone] until 7 pm After 7:00 pm call Elink  [phone]   07/20/2023

## 2023-07-20 NOTE — Transfer of Care (Signed)
Immediate Anesthesia Transfer of Care Note  Patient: [name]  Procedure(s) Performed: LAPAROSCOPIC CHOLECYSTECTOMY  Patient Location: PACU  Anesthesia Type:General  Level of Consciousness: awake, oriented, drowsy, and patient cooperative  Airway & Oxygen Therapy: Patient Spontanous Breathing and Patient connected to face mask oxygen  Post-op Assessment: Report given to RN, Post -op Vital signs reviewed and stable, and Patient moving all extremities  Post vital signs: stable  Last Vitals:  Vitals Value Taken Time  BP 100/57 07/20/23 1809  Temp    Pulse 73 07/20/23 1813  Resp 18 07/20/23 1813  SpO2 90 % 07/20/23 1813  Vitals shown include unfiled device data.  Last Pain:  Vitals:   07/20/23 1640  TempSrc: Oral  PainSc: 0-No pain      Patients Stated Pain Goal: 0 (07/19/23 2129)  Complications: No notable events documented.

## 2023-07-20 NOTE — Evaluation (Signed)
Physical Therapy Evaluation Patient Details Name: [name] MRN: [id number] DOB: 09-03-1946 Today's Date: 07/20/2023  History of Present Illness  Patient is 77 y.o. female presenting with multiple complaints including nausea, vomiting, chest pain, back pain. Consistent with sepsis due to acute cholecystitis she was admitted to the hospital, seen by general surgery. PMH significant of MS, paraplegia, largely bed and wheelchair-bound for 15 years, chronic lower extremity contractures, neurogenic bladder/bowel, spasticity, hyperlipidemia, BPPV, cervical spinal stenosis.   Clinical Impression  Patient evaluated by Physical Therapy with no further acute PT needs identified. All education has been completed and the patient has no further questions. Patient requires total assist for bed mobility and mechanical lift transfers bed<>WC. Pt is able to mobilize with power wheelchair at ALF. Pt requires assist for ADL's at baseline and can feed self with set up assist. Pt expresses she has no interest in skilled PT and does not have and goals she wishes to progress towards at this time. See below for any follow-up Physical Therapy or equipment needs. PT is signing off. Thank you for this referral.         If plan is discharge home, recommend the following: Two people to help with walking and/or transfers;Two people to help with bathing/dressing/bathroom;Direct supervision/assist for medications management;Assist for transportation;Help with stairs or ramp for entrance;Assistance with feeding   Can travel by private vehicle        Equipment Recommendations None recommended by PT  Recommendations for Other Services       Functional Status Assessment Patient has not had a recent decline in their functional status     Precautions / Restrictions Precautions Precautions: Fall Restrictions Weight Bearing Restrictions: No      Mobility  Bed Mobility Overal bed mobility: Needs Assistance Bed  Mobility: Rolling Rolling: Max assist         General bed mobility comments: Max assist with use of bed pad to roll Rt/Lt for repositioning. Pt able to reach UE's to bed rail for upper trunk roll. Max/total assist for lower body positioning and to initiate turn/roll.    Transfers                   General transfer comment: pt declined to attempt to sit EOB or any additional mobility    Ambulation/Gait                  Stairs            Wheelchair Mobility     Tilt Bed    Modified Rankin (Stroke Patients Only)          Pertinent Vitals/Pain Pain Assessment Pain Assessment: No/denies pain    Home Living Family/patient expects to be discharged to:: Assisted living   Available Help at Discharge: Skilled Nursing Facility (ALF ([facility])) Type of Home: Assisted living Home Access: Level entry       Home Layout: One level Home Equipment: Hospital bed;Wheelchair - power (hoyer lift)      Prior Function Prior Level of Function : Needs assist       Physical Assist : Mobility (physical);ADLs (physical) Mobility (physical): Bed mobility;Transfers ADLs (physical): Grooming;Bathing;Dressing;Toileting Mobility Comments: pt has been a WC user for 15+ years and for the last 2-3 years has been dependent with mechanical lift transfers to move bed<>WC. ADLs Comments: total for dressing/bathing, pt requries set up assist for feeding.     Hand Dominance        Extremity/Trunk Assessment   Upper  Extremity Assessment Upper Extremity Assessment: Generalized weakness (good functional grip)    Lower Extremity Assessment Lower Extremity Assessment: Generalized weakness (significant LE contractures for hip and knee flexion lacking extension at both joints bil)    Cervical / Trunk Assessment Cervical / Trunk Assessment: Kyphotic  Communication   Communication: No difficulties  Cognition Arousal/Alertness: Awake/alert Behavior During Therapy: WFL  for tasks assessed/performed Overall Cognitive Status: Within Functional Limits for tasks assessed                                          General Comments      Exercises     Assessment/Plan    PT Assessment Patient does not need any further PT services  PT Problem List         PT Treatment Interventions      PT Goals (Current goals can be found in the Care Plan section)  Acute Rehab PT Goals Patient Stated Goal: none stated by pt PT Goal Formulation: All assessment and education complete, DC therapy Time For Goal Achievement: 07/27/23 Potential to Achieve Goals: Good    Frequency       Co-evaluation               AM-PAC PT "6 Clicks" Mobility  Outcome Measure Help needed turning from your back to your side while in a flat bed without using bedrails?: A Lot Help needed moving from lying on your back to sitting on the side of a flat bed without using bedrails?: Total Help needed moving to and from a bed to a chair (including a wheelchair)?: Total Help needed standing up from a chair using your arms (e.g., wheelchair or bedside chair)?: Total Help needed to walk in hospital room?: Total Help needed climbing 3-5 steps with a railing? : Total 6 Click Score: 7    End of Session   Activity Tolerance: Patient tolerated treatment well Patient left: with call bell/phone within reach;in bed;with family/visitor present Nurse Communication: Mobility status      Time: 1027-2536 PT Time Calculation (min) (ACUTE ONLY): 24 min   Charges:   PT Evaluation $PT Eval Low Complexity: 1 Low   PT General Charges $$ ACUTE PT VISIT: 1 Visit          Wynn Maudlin, DPT Acute Rehabilitation Services Office [phone]  07/20/23 3:36 PM

## 2023-07-20 NOTE — Progress Notes (Signed)
Progress Note: General Surgery Service   Chief Complaint/Subjective: Medical workup being completed.  Continued left abdominal pain.    Objective: Vital signs in last 24 hours: Temp:  [99.1 F (37.3 C)-100.3 F (37.9 C)] 99.1 F (37.3 C) (08/05 0850) Pulse Rate:  [79-86] 79 (08/05 0850) Resp:  [14-22] 14 (08/05 1300) BP: (79-123)/(48-73) 97/59 (08/05 1200) SpO2:  [97 %-100 %] 97 % (08/05 0656) Weight:  [63.1 kg] 63.1 kg (08/04 2129) Last BM Date : 07/19/23  Intake/Output from previous day: 08/04 0701 - 08/05 0700 In: 3102.4 [P.O.:120; I.V.:1935.8; IV Piggyback:1046.6] Out: -  Intake/Output this shift: No intake/output data recorded.  GI: Abd Soft, tender epigastric and across the right and left abdomen.  Lab Results: CBC  Recent Labs    07/19/23 1204 07/20/23 0425  WBC 23.1* 39.4*  HGB 14.3 12.2  HCT 44.3 37.2  PLT 412* 271   BMET Recent Labs    07/19/23 1204 07/20/23 0425  NA 141 137  K 4.4 3.4*  CL 108 107  CO2 18* 20*  GLUCOSE 128* 108*  BUN 19 18  CREATININE 0.73 0.88  CALCIUM 9.0 8.0*   PT/INR No results for input(s): "LABPROT", "INR" in the last 72 hours. ABG No results for input(s): "PHART", "HCO3" in the last 72 hours.  Invalid input(s): "PCO2", "PO2"  Anti-infectives: Anti-infectives (From admission, onward)    Start     Dose/Rate Route Frequency Ordered Stop   07/20/23 2200  metroNIDAZOLE (FLAGYL) tablet 500 mg        500 mg Oral Every 12 hours 07/20/23 1153     07/20/23 1600  cefTRIAXone (ROCEPHIN) 2 g in sodium chloride 0.9 % 100 mL IVPB        2 g 200 mL/hr over 30 Minutes Intravenous Every 24 hours 07/20/23 1153     07/20/23 0200  piperacillin-tazobactam (ZOSYN) IVPB 3.375 g  Status:  Discontinued       Placed in "Followed by" Linked Group   3.375 g 12.5 mL/hr over 240 Minutes Intravenous Every 8 hours 07/19/23 1834 07/20/23 1153   07/19/23 1845  piperacillin-tazobactam (ZOSYN) IVPB 3.375 g       Placed in "Followed by" Linked  Group   3.375 g 100 mL/hr over 30 Minutes Intravenous  Once 07/19/23 1834 07/19/23 1954   07/19/23 1715  piperacillin-tazobactam (ZOSYN) IVPB 3.375 g  Status:  Discontinued        3.375 g 100 mL/hr over 30 Minutes Intravenous  Once 07/19/23 1701 07/19/23 1834       Medications: Scheduled Meds:  heparin injection (subcutaneous)  5,000 Units Subcutaneous Q8H   metroNIDAZOLE  500 mg Oral Q12H   midodrine  10 mg Oral TID WC   ondansetron (ZOFRAN) IV  4 mg Intravenous Once   pantoprazole (PROTONIX) IV  40 mg Intravenous Q24H   sodium chloride flush  3 mL Intravenous Q12H   Continuous Infusions:  sodium chloride 100 mL/hr at 07/20/23 1356   cefTRIAXone (ROCEPHIN)  IV     PRN Meds:.acetaminophen **OR** acetaminophen, polyethylene glycol  Assessment/Plan: [name] is a 77 year old female with MS and multiple medical issues.  She presents with acute calculous cholecystitis and fecal impaction.  I recommended laparoscopic cholecystectomy and manual fecal disimpaction for the patient.  We can proceed if medicine team feels she is medically optimized for surgery.  I described the procedure itself as well as its risks, benefits and alternatives to the patient and the patient's son who granted consent to proceed.  We will proceed as soon as medical optimization complete and OR time is available.   LOS: 0 days    Quentin Ore, MD  [facility] Use [email or web address] to contact on call provider  Daily Billing: [phone] - High MDM

## 2023-07-20 NOTE — Progress Notes (Signed)
ANTICOAGULATION CONSULT NOTE - Follow Up Consult  Pharmacy Consult for heparin Indication:  r/o PE  Labs: Recent Labs    07/19/23 1204 07/19/23 1513 07/20/23 0425  HGB 14.3  --  12.2  HCT 44.3  --  37.2  PLT 412*  --  271  HEPARINUNFRC  --   --  >1.10*  CREATININE 0.73  --   --   TROPONINIHS 11 10  --     Assessment: 77yo female supratherapeutic on heparin with initial dosing for possible PE; no infusion issues or signs of bleeding per RN though noted that pt is a hard stick, difficult to know where the labs were drawn but there is only one bandage on the arm opposite the heparin infusion.  Goal of Therapy:  Heparin level 0.3-0.7 units/ml   Plan:  Hold heparin infusion x1h then decrease heparin infusion by 3-4 units/kg/hr to 750 units/hr. Check level in 8 hours.   Vernard Gambles, PharmD, BCPS 07/20/2023 5:29 AM

## 2023-07-20 NOTE — Anesthesia Procedure Notes (Signed)
Procedure Name: Intubation Date/Time: 07/20/2023 5:15 PM  Performed by: Kayleen Memos, CRNAPre-anesthesia Checklist: Patient identified and Emergency Drugs available Patient Re-evaluated:Patient Re-evaluated prior to induction Oxygen Delivery Method: Circle system utilized Preoxygenation: Pre-oxygenation with 100% oxygen Induction Type: IV induction, Rapid sequence and Cricoid Pressure applied Laryngoscope Size: Glidescope and 3 Grade View: Grade I Tube type: Oral Number of attempts: 1 Airway Equipment and Method: Stylet and Video-laryngoscopy Placement Confirmation: ETT inserted through vocal cords under direct vision, positive ETCO2 and breath sounds checked- equal and bilateral Secured at: 22 cm Tube secured with: Tape Dental Injury: Teeth and Oropharynx as per pre-operative assessment

## 2023-07-20 NOTE — Progress Notes (Signed)
Lower extremity venous duplex completed. Please see CV Procedures for preliminary results.  Shona Simpson, RVT 07/20/23 10:42 AM

## 2023-07-21 ENCOUNTER — Encounter (HOSPITAL_COMMUNITY): Payer: Self-pay | Admitting: Surgery

## 2023-07-21 ENCOUNTER — Inpatient Hospital Stay (HOSPITAL_COMMUNITY): Payer: Medicare Other

## 2023-07-21 DIAGNOSIS — K810 Acute cholecystitis: Secondary | ICD-10-CM | POA: Diagnosis not present

## 2023-07-21 DIAGNOSIS — R6520 Severe sepsis without septic shock: Secondary | ICD-10-CM | POA: Diagnosis not present

## 2023-07-21 DIAGNOSIS — J9601 Acute respiratory failure with hypoxia: Secondary | ICD-10-CM | POA: Diagnosis not present

## 2023-07-21 DIAGNOSIS — A419 Sepsis, unspecified organism: Secondary | ICD-10-CM | POA: Diagnosis not present

## 2023-07-21 LAB — TSH: TSH: 1.028 u[IU]/mL (ref 0.350–4.500)

## 2023-07-21 LAB — GLUCOSE, CAPILLARY: Glucose-Capillary: 121 mg/dL — ABNORMAL HIGH (ref 70–99)

## 2023-07-21 MED ORDER — PANTOPRAZOLE SODIUM 40 MG PO TBEC
40.0000 mg | DELAYED_RELEASE_TABLET | Freq: Every day | ORAL | Status: DC
  Administered 2023-07-22 – 2023-07-24 (×3): 40 mg via ORAL
  Filled 2023-07-21 (×3): qty 1

## 2023-07-21 MED ORDER — GABAPENTIN 100 MG PO CAPS
100.0000 mg | ORAL_CAPSULE | Freq: Three times a day (TID) | ORAL | Status: DC
  Administered 2023-07-21 (×2): 100 mg via ORAL
  Filled 2023-07-21 (×3): qty 1

## 2023-07-21 MED ORDER — OXYCODONE HCL 5 MG PO TABS: 5.0000 mg | ORAL_TABLET | ORAL | Status: DC | PRN

## 2023-07-21 MED ORDER — ENSURE ENLIVE PO LIQD
237.0000 mL | Freq: Two times a day (BID) | ORAL | Status: DC
  Administered 2023-07-21 – 2023-07-25 (×7): 237 mL via ORAL

## 2023-07-21 MED ORDER — MAGNESIUM SULFATE 4 GM/100ML IV SOLN
4.0000 g | Freq: Once | INTRAVENOUS | Status: AC
  Administered 2023-07-21: 4 g via INTRAVENOUS
  Filled 2023-07-21: qty 100

## 2023-07-21 NOTE — Progress Notes (Signed)
Progress Note: General Surgery Service   Chief Complaint/Subjective: Doing well POD1  Objective: Vital signs in last 24 hours: Temp:  [98 F (36.7 C)-98.5 F (36.9 C)] 98 F (36.7 C) (08/06 0845) Pulse Rate:  [44-74] 49 (08/06 0700) Resp:  [13-23] 15 (08/06 0700) BP: (89-140)/(50-78) 122/77 (08/06 0700) SpO2:  [84 %-95 %] 93 % (08/06 0700) Weight:  [63.9 kg] 63.9 kg (08/06 0500) Last BM Date : 07/20/23  Intake/Output from previous day: 08/05 0701 - 08/06 0700 In: 2106.7 [P.O.:600; I.V.:1406.7; IV Piggyback:100] Out: 5 [Blood:5] Intake/Output this shift: No intake/output data recorded.  GI: Abd inicisons c/d/I healing  Lab Results: CBC  Recent Labs    07/20/23 0425 07/21/23 0558  WBC 39.4* 22.9*  HGB 12.2 12.0  HCT 37.2 36.5  PLT 271 220   BMET Recent Labs    07/20/23 0425 07/21/23 0558  NA 137 139  K 3.4* 3.8  CL 107 111  CO2 20* 17*  GLUCOSE 108* 112*  BUN 18 13  CREATININE 0.88 0.76  CALCIUM 8.0* 7.7*   PT/INR No results for input(s): "LABPROT", "INR" in the last 72 hours. ABG No results for input(s): "PHART", "HCO3" in the last 72 hours.  Invalid input(s): "PCO2", "PO2"  Anti-infectives: Anti-infectives (From admission, onward)    Start     Dose/Rate Route Frequency Ordered Stop   07/20/23 2200  metroNIDAZOLE (FLAGYL) tablet 500 mg        500 mg Oral Every 12 hours 07/20/23 1153     07/20/23 1600  cefTRIAXone (ROCEPHIN) 2 g in sodium chloride 0.9 % 100 mL IVPB        2 g 200 mL/hr over 30 Minutes Intravenous Every 24 hours 07/20/23 1153     07/20/23 0200  piperacillin-tazobactam (ZOSYN) IVPB 3.375 g  Status:  Discontinued       Placed in "Followed by" Linked Group   3.375 g 12.5 mL/hr over 240 Minutes Intravenous Every 8 hours 07/19/23 1834 07/20/23 1153   07/19/23 1845  piperacillin-tazobactam (ZOSYN) IVPB 3.375 g       Placed in "Followed by" Linked Group   3.375 g 100 mL/hr over 30 Minutes Intravenous  Once 07/19/23 1834 07/19/23 1954    07/19/23 1715  piperacillin-tazobactam (ZOSYN) IVPB 3.375 g  Status:  Discontinued        3.375 g 100 mL/hr over 30 Minutes Intravenous  Once 07/19/23 1701 07/19/23 1834       Medications: Scheduled Meds:  acetaminophen  650 mg Oral Q6H   docusate sodium  100 mg Oral BID   feeding supplement  237 mL Oral BID BM   gabapentin  100 mg Oral TID   heparin injection (subcutaneous)  5,000 Units Subcutaneous Q8H   metroNIDAZOLE  500 mg Oral Q12H   pantoprazole (PROTONIX) IV  40 mg Intravenous Q24H   sodium chloride flush  3 mL Intravenous Q12H   Continuous Infusions:  cefTRIAXone (ROCEPHIN)  IV Stopped (07/20/23 1634)   magnesium sulfate bolus IVPB 4 g (07/21/23 0852)   methocarbamol (ROBAXIN) IV     PRN Meds:.acetaminophen **OR** acetaminophen, HYDROmorphone (DILAUDID) injection, methocarbamol (ROBAXIN) IV, ondansetron (ZOFRAN) IV, oxyCODONE, polyethylene glycol, prochlorperazine, sodium phosphate  Assessment/Plan: s/p Procedure(s): LAPAROSCOPIC CHOLECYSTECTOMY 07/20/2023  Doing well after lap chole No impacted stool on exam under anesthesia Diet as tolerated Pain control Discharge when ready   LOS: 1 day     Quentin Ore, MD  [facility] Use [email or web address] to contact on call provider  Daily  Billing: [phone] - post op

## 2023-07-21 NOTE — Discharge Instructions (Signed)

## 2023-07-21 NOTE — Progress Notes (Signed)
NAME:  [name], MRN:  [id number], DOB:  08-01-1946, LOS: 1 ADMISSION DATE:  07/19/2023, CONSULTATION DATE:  07/21/2023  REFERRING MD:  Thedore Mins, TRH, CHIEF COMPLAINT: Abdominal pain  History of Present Illness:  77 year old woman with advanced multiple sclerosis and functional paraplegic BIBEMS from SNF with central intermittent chest pain, upper abdominal pain radiating to her back and nausea vomiting. She was hypoxic requiring 3 L nasal cannula.  There was a reported injury to back 2 weeks ago.  She is bedbound at baseline Initial labs significant for leukocytosis 23 K, CT chest/abdomen/pelvis showed minimal consolidation at the lung bases left more than right, large stool volume up to 7 cm in the colon, nonobstructive renal stones and gallbladder stones. Right upper quadrant ultrasound showed gallbladder thickening and positive Murphy sign.  She was admitted with presumptive diagnosis of acute cholecystitis Due to elevated D-dimer and hypoxia, she underwent VQ scan which was negative and bilateral venous duplex was negative. Echo showed normal LVEF 50 to 55%. Evaluated by general surgery and plan is to proceed with cholecystectomy.  She had hypotension responding to fluids and due to this PCCM consult is requested with the idea that she may need pressors postop  Pertinent  Medical History  Multiple sclerosis Functional paraplegia, neurogenic bladder Cervical spinal stenosis  Significant Hospital Events: Including procedures, antibiotic start and stop dates in addition to other pertinent events   07/20/2019 for laparoscopic cholecystectomy Interim History / Subjective:  Looks very comfortable  Objective   Blood pressure 122/77, pulse (!) 49, temperature 98 F (36.7 C), temperature source Oral, resp. rate 15, height 5\' 3"  (1.6 m), weight 63.9 kg, SpO2 93%.        Intake/Output Summary (Last 24 hours) at 07/21/2023 1143 Last data filed at 07/21/2023 0400 Gross per 24 hour  Intake  2106.74 ml  Output 5 ml  Net 2101.74 ml   Filed Weights   07/19/23 1209 07/19/23 2129 07/21/23 0500  Weight: 59.9 kg 63.1 kg 63.9 kg    Examination: No acute distress on 3 L nasal cannula No JVD lymphadenopathy is appreciated Decreased breath sounds in the bases Heart sounds are regular Nontender surgical sites unremarkable Extremities mild edema  Resolved Hospital Problem list     Assessment & Plan:  Severe sepsis Proteus bacteremia likely due to acute cholecystitis, urine has not been checked  Status post laparoscopic cholecystectomy Hemodynamically stable Continue current antimicrobial therapy Rocephin and Flagyl  Bowel impaction - Rectal exam during surgery showed no impaction  Acute respiratory failure with hypoxia POA -pulm embolism ruled out Likely related to atelectasis/splinting from pain rather than actual pneumonia Pulmonary toilet Currently on 3 L nasal cannula     Best Practice (right click and "Reselect all SmartList Selections" daily)   Diet/type: NPO DVT prophylaxis: LMWH GI prophylaxis: N/A Lines: N/A Foley:  N/A Code Status:  full code Last date of multidisciplinary goals of care discussion [NA]  Labs   CBC: Recent Labs  Lab 07/19/23 1204 07/20/23 0425 07/21/23 0558  WBC 23.1* 39.4* 22.9*  NEUTROABS 22.4*  --  21.0*  HGB 14.3 12.2 12.0  HCT 44.3 37.2 36.5  MCV 93.5 90.7 91.3  PLT 412* 271 220    Basic Metabolic Panel: Recent Labs  Lab 07/19/23 1204 07/20/23 0425 07/21/23 0558  NA 141 137 139  K 4.4 3.4* 3.8  CL 108 107 111  CO2 18* 20* 17*  GLUCOSE 128* 108* 112*  BUN 19 18 13   CREATININE 0.73 0.88 0.76  CALCIUM 9.0 8.0* 7.7*  MG  --   --  1.6*   GFR: Estimated Creatinine Clearance: 53 mL/min (by C-G formula based on SCr of 0.76 mg/dL). Recent Labs  Lab 07/19/23 1204 07/20/23 0425 07/20/23 0602 07/21/23 0558  PROCALCITON  --   --  57.93 35.11  WBC 23.1* 39.4*  --  22.9*  LATICACIDVEN  --   --  2.5*  --      Liver Function Tests: Recent Labs  Lab 07/19/23 1204 07/20/23 0425 07/21/23 0558  AST 53* 36 33  ALT 26 26 23   ALKPHOS 108 91 82  BILITOT 1.7* 1.1 0.7  PROT 6.5 5.4* 5.3*  ALBUMIN 3.3* 2.5* 2.3*   Recent Labs  Lab 07/19/23 1204  LIPASE 36   No results for input(s): "AMMONIA" in the last 168 hours.  ABG No results found for: "PHART", "PCO2ART", "PO2ART", "HCO3", "TCO2", "ACIDBASEDEF", "O2SAT"   Coagulation Profile: No results for input(s): "INR", "PROTIME" in the last 168 hours.  Cardiac Enzymes: No results for input(s): "CKTOTAL", "CKMB", "CKMBINDEX", "TROPONINI" in the last 168 hours.  HbA1C: No results found for: "HGBA1C"  CBG: Recent Labs  Lab 07/21/23 0839  GLUCAP 121*    Review of Systems:   Constitutional: negative for anorexia, fevers and sweats  Eyes: negative for irritation, redness and visual disturbance  Ears, nose, mouth, throat, and face: negative for earaches, epistaxis, nasal congestion and sore throat  Respiratory: negative for cough,  sputum and wheezing  Cardiovascular: negative for   orthopnea, palpitations and syncope  Gastrointestinal: negative for  constipation, diarrhea, melena, nausea and vomiting  Genitourinary:negative for dysuria, frequency and hematuria  Hematologic/lymphatic: negative for bleeding, easy bruising and lymphadenopathy  Musculoskeletal:negative for arthralgias, muscle weakness and stiff joints  Neurological: negative for coordination problems, gait problems, headaches and weakness  Endocrine: negative for diabetic symptoms including polydipsia, polyuria and weight loss     Brett Canales  ACNP Acute Care Nurse Practitioner [facility] Please consult Amion 07/21/2023, 11:43 AM

## 2023-07-21 NOTE — Plan of Care (Signed)

## 2023-07-21 NOTE — Anesthesia Postprocedure Evaluation (Signed)
Anesthesia Post Note  Patient: [name]  Procedure(s) Performed: LAPAROSCOPIC CHOLECYSTECTOMY     Patient location during evaluation: PACU Anesthesia Type: General Level of consciousness: sedated and patient cooperative Pain management: pain level controlled Vital Signs Assessment: post-procedure vital signs reviewed and stable Respiratory status: spontaneous breathing Cardiovascular status: stable Anesthetic complications: no   No notable events documented.  Last Vitals:  Vitals:   07/21/23 1700 07/21/23 1709  BP: 123/71 (!) 111/58  Pulse: 80 76  Resp: (!) 22 16  Temp:  36.5 C  SpO2: (!) 83%     Last Pain:  Vitals:   07/21/23 1130  TempSrc: Oral  PainSc:                  [name]

## 2023-07-21 NOTE — Progress Notes (Addendum)
PROGRESS NOTE                                                                                                                                                                                                             Patient Demographics:    [name], is a 77 y.o. female, DOB - [birth year], EXB:[id number]  Outpatient Primary MD for the patient is Karie Schwalbe, MD    LOS - 1  Admit date - 07/19/2023    Chief Complaint  Patient presents with   Nausea   Emesis   Back Pain   Chest Pain       Brief Narrative (HPI from H&P)    77 y.o. female with medical history significant of MS, paraplegia, neurogenic bladder/bowel, spasticity, hyperlipidemia, BPPV, cervical spinal stenosis presenting with multiple complaints including nausea, vomiting, chest pain, back pain.  Consistent with sepsis due to acute cholecystitis she was admitted to the hospital, seen by general surgery.   Subjective:   Patient in bed, appears comfortable, denies any headache, no fever, no chest pain or pressure, no shortness of breath , no abdominal pain. No new focal weakness.   Assessment  & Plan :    Sepsis present on admission due to acute cholecystitis with gram-negative Proteus bacteremia.  General surgery on board, underwent laparoscopic cholecystectomy by general surgery on 07/20/2023, on empiric IV antibiotics, sepsis pathophysiology improving, continue to monitor.  Follow final culture and sensitivity.  Acute hypoxic respiratory failure at the time of admission.  Currently chest pain-free stable on 2 L nasal cannula oxygen, to be increased effort due to sepsis, nausea vomiting and possible mild aspiration pneumonia, stable echocardiogram, VQ scan, continue supportive care.  Stable on present antibiotics.  HX of MS, cervical stenosis, chronic functional paraplegia, neurogenic bladder, chronic generalized weakness worse in bilateral lower  extremities along with chronic lower extremity contractures due to multiple different musculoskeletal issues according to the patient, largely bed and wheelchair-bound for the last 15 years.  Supportive care.  Hypokalemia - Hypomagnesemia.  Replaced.  D-dimer.  Due to cholecystitis and sepsis.  VQ scan unremarkable, lower extremity duplex unremarkable, stable echo.  Supportive care.  Sinus bradycardia.  Check TSH, discontinue midodrine as could be having reflex bradycardia.      Condition - Extremely Guarded  Family Communication  :  son [name] [phone]  -  07/20/23  Code Status :  Full  Consults  :  CCS, PCCM  PUD Prophylaxis : PPI   Procedures  :     Laparoscopic cholecystectomy on 07/20/2023   VQ.  Low probability  TTE - 1. Left ventricular ejection fraction, by estimation, is 50 to 55%. The left ventricle has low normal function. The left ventricle has no regional wall motion abnormalities. Left ventricular diastolic parameters are consistent with Grade II diastolic dysfunction (pseudonormalization).  2. Right ventricular systolic function is normal. The right ventricular size is normal.  3. The mitral valve was not well visualized. No evidence of mitral valve regurgitation.  4. The aortic valve is tricuspid. Aortic valve regurgitation is not visualized.  5. The inferior vena cava is normal in size with greater than 50% respiratory variability, suggesting right atrial pressure of 3 mmHg  Leg [place] no DVT  CT scan abdomen pelvis.  1. Dependent collapse/consolidation in the lung bases, left greater than right. Imaging features to likely related to atelectasis although pneumonia not entirely excluded. 2. Small to moderate hiatal hernia. 3. Cholelithiasis. 4. 7 mm nonobstructing stone lower pole left kidney. 5. Large stool volume in the rectum with distention up to 7 cm diameter. 6.  Aortic Atherosclerosis (ICD10-I70.0).  RUQ [place] -positive Murphy sign      Disposition Plan  :     Status is: Observation  DVT Prophylaxis  :  Heparin   Lab Results  Component Value Date   PLT 220 07/21/2023    Diet :  Diet Order             Diet regular Room service appropriate? Yes; Fluid consistency: Thin  Diet effective now                    Inpatient Medications  Scheduled Meds:  acetaminophen  650 mg Oral Q6H   docusate sodium  100 mg Oral BID   feeding supplement  237 mL Oral BID BM   gabapentin  100 mg Oral TID   heparin injection (subcutaneous)  5,000 Units Subcutaneous Q8H   metroNIDAZOLE  500 mg Oral Q12H   pantoprazole (PROTONIX) IV  40 mg Intravenous Q24H   sodium chloride flush  3 mL Intravenous Q12H   Continuous Infusions:  cefTRIAXone (ROCEPHIN)  IV Stopped (07/20/23 1634)   magnesium sulfate bolus IVPB     methocarbamol (ROBAXIN) IV     PRN Meds:.acetaminophen **OR** acetaminophen, HYDROmorphone (DILAUDID) injection, methocarbamol (ROBAXIN) IV, ondansetron (ZOFRAN) IV, oxyCODONE, polyethylene glycol, prochlorperazine, sodium phosphate  Antibiotics  :    Anti-infectives (From admission, onward)    Start     Dose/Rate Route Frequency Ordered Stop   07/20/23 2200  metroNIDAZOLE (FLAGYL) tablet 500 mg        500 mg Oral Every 12 hours 07/20/23 1153     07/20/23 1600  cefTRIAXone (ROCEPHIN) 2 g in sodium chloride 0.9 % 100 mL IVPB        2 g 200 mL/hr over 30 Minutes Intravenous Every 24 hours 07/20/23 1153     07/20/23 0200  piperacillin-tazobactam (ZOSYN) IVPB 3.375 g  Status:  Discontinued       Placed in "Followed by" Linked Group   3.375 g 12.5 mL/hr over 240 Minutes Intravenous Every 8 hours 07/19/23 1834 07/20/23 1153   07/19/23 1845  piperacillin-tazobactam (ZOSYN) IVPB 3.375 g       Placed in "Followed by" Linked Group   3.375 g 100 mL/hr over 30 Minutes Intravenous  Once 07/19/23 1834 07/19/23 1954   07/19/23 1715  piperacillin-tazobactam (ZOSYN) IVPB 3.375 g  Status:  Discontinued        3.375 g 100 mL/hr over 30 Minutes  Intravenous  Once 07/19/23 1701 07/19/23 1834         Objective:   Vitals:   07/21/23 0400 07/21/23 0415 07/21/23 0500 07/21/23 0700  BP: (!) 140/72 127/64  122/77  Pulse: 60 (!) 52  (!) 49  Resp: 16 17  15   Temp:      TempSrc:      SpO2: 91% 92%  93%  Weight:   63.9 kg   Height:        Wt Readings from Last 3 Encounters:  07/21/23 63.9 kg  10/07/22 59.9 kg  07/09/22 54.4 kg     Intake/Output Summary (Last 24 hours) at 07/21/2023 0829 Last data filed at 07/21/2023 0400 Gross per 24 hour  Intake 2106.74 ml  Output 5 ml  Net 2101.74 ml     Physical Exam  Awake Alert, No new F.N deficits, chronic weakness worse in bilateral lower extremities, largely bed and wheelchair-bound for 15 years, chronic lower extremity contractures [place].AT,PERRAL Supple Neck, No JVD,   Symmetrical Chest wall movement, Good air movement bilaterally, CTAB RRR,No Gallops,Rubs or new Murmurs,  +ve B.Sounds, Abd Soft, laparoscopic postop site stable No Cyanosis, Clubbing or edema        Data Review:    Recent Labs  Lab 07/19/23 1204 07/20/23 0425 07/21/23 0558  WBC 23.1* 39.4* 22.9*  HGB 14.3 12.2 12.0  HCT 44.3 37.2 36.5  PLT 412* 271 220  MCV 93.5 90.7 91.3  MCH 30.2 29.8 30.0  MCHC 32.3 32.8 32.9  RDW 12.4 12.7 12.9  LYMPHSABS 0.4*  --  1.0  MONOABS 0.1  --  0.6  EOSABS 0.0  --  0.0  BASOSABS 0.0  --  0.0    Recent Labs  Lab 07/19/23 1204 07/19/23 1342 07/20/23 0425 07/20/23 0602 07/20/23 0657 07/21/23 0558  NA 141  --  137  --   --  139  K 4.4  --  3.4*  --   --  3.8  CL 108  --  107  --   --  111  CO2 18*  --  20*  --   --  17*  ANIONGAP 15  --  10  --   --  11  GLUCOSE 128*  --  108*  --   --  112*  BUN 19  --  18  --   --  13  CREATININE 0.73  --  0.88  --   --  0.76  AST 53*  --  36  --   --  33  ALT 26  --  26  --   --  23  ALKPHOS 108  --  91  --   --  82  BILITOT 1.7*  --  1.1  --   --  0.7  ALBUMIN 3.3*  --  2.5*  --   --  2.3*  CRP  --   --   --   --   17.7* 18.9*  DDIMER  --  >20.00*  --   --   --   --   PROCALCITON  --   --   --  57.93  --  35.11  LATICACIDVEN  --   --   --  2.5*  --   --   TSH  --   --  0.959  --   --   --   BNP  --   --   --   --   --  1,098.9*  MG  --   --   --   --   --  1.6*  CALCIUM 9.0  --  8.0*  --   --  7.7*      Recent Labs  Lab 07/19/23 1204 07/19/23 1342 07/20/23 0425 07/20/23 0602 07/20/23 0657 07/21/23 0558  CRP  --   --   --   --  17.7* 18.9*  DDIMER  --  >20.00*  --   --   --   --   PROCALCITON  --   --   --  57.93  --  35.11  LATICACIDVEN  --   --   --  2.5*  --   --   TSH  --   --  0.959  --   --   --   BNP  --   --   --   --   --  1,098.9*  MG  --   --   --   --   --  1.6*  CALCIUM 9.0  --  8.0*  --   --  7.7*    Recent Labs    07/20/23 0425  TSH 0.959     Micro Results Recent Results (from the past 240 hour(s))  Resp panel by RT-PCR (RSV, Flu A&B, Covid) Anterior Nasal Swab     Status: None   Collection Time: 07/19/23  3:25 PM   Specimen: Anterior Nasal Swab  Result Value Ref Range Status   SARS Coronavirus 2 by RT PCR NEGATIVE NEGATIVE Final   Influenza A by PCR NEGATIVE NEGATIVE Final   Influenza B by PCR NEGATIVE NEGATIVE Final    Comment: (NOTE) The Xpert Xpress SARS-CoV-2/FLU/RSV plus assay is intended as an aid in the diagnosis of influenza from Nasopharyngeal swab specimens and should not be used as a sole basis for treatment. Nasal washings and aspirates are unacceptable for Xpert Xpress SARS-CoV-2/FLU/RSV testing.  Fact Sheet for Patients: BloggerCourse.com  Fact Sheet for Healthcare Providers: SeriousBroker.it  This test is not yet approved or cleared by the [place] FDA and has been authorized for detection and/or diagnosis of SARS-CoV-2 by FDA under an Emergency Use Authorization (EUA). This EUA will remain in effect (meaning this test can be used) for the duration of the COVID-19 declaration under  Section 564(b)(1) of the Act, 21 U.S.C. section 360bbb-3(b)(1), unless the authorization is terminated or revoked.     Resp Syncytial Virus by PCR NEGATIVE NEGATIVE Final    Comment: (NOTE) Fact Sheet for Patients: BloggerCourse.com  Fact Sheet for Healthcare Providers: SeriousBroker.it  This test is not yet approved or cleared by the [place] FDA and has been authorized for detection and/or diagnosis of SARS-CoV-2 by FDA under an Emergency Use Authorization (EUA). This EUA will remain in effect (meaning this test can be used) for the duration of the COVID-19 declaration under Section 564(b)(1) of the Act, 21 U.S.C. section 360bbb-3(b)(1), unless the authorization is terminated or revoked.  Performed at [facility], [address], [place], [place] [address]   Blood culture (routine x 2)     Status: None (Preliminary result)   Collection Time: 07/19/23  6:10 PM   Specimen: BLOOD  Result Value Ref Range Status   Specimen Description BLOOD BLOOD RIGHT ARM  Final   Special Requests   Final  BOTTLES DRAWN AEROBIC AND ANAEROBIC Blood Culture results may not be optimal due to an inadequate volume of blood received in culture bottles   Culture  Setup Time   Final    GRAM NEGATIVE RODS IN BOTH AEROBIC AND ANAEROBIC BOTTLES CRITICAL RESULT CALLED TO, READ BACK BY AND VERIFIED WITHLennie Hummer, AT 1123 07/20/23 Renato Shin Performed at [facility], [address], [place], [place] [address]    Culture GRAM NEGATIVE RODS  Final   Report Status PENDING  Incomplete  Blood culture (routine x 2)     Status: None (Preliminary result)   Collection Time: 07/19/23  6:10 PM   Specimen: BLOOD  Result Value Ref Range Status   Specimen Description BLOOD BLOOD RIGHT ARM  Final   Special Requests   Final    BOTTLES DRAWN AEROBIC AND ANAEROBIC Blood Culture results may not be optimal due to an inadequate volume of blood  received in culture bottles   Culture  Setup Time   Final    GRAM NEGATIVE RODS IN BOTH AEROBIC AND ANAEROBIC BOTTLES Organism ID to follow CRITICAL RESULT CALLED TO, READ BACK BY AND VERIFIED WITHAntoine Primas PHARMD, AT 1114 07/20/23 Renato Shin Performed at [facility], [address], [place], [place] [address]    Culture GRAM NEGATIVE RODS  Final   Report Status PENDING  Incomplete  Blood Culture ID Panel (Reflexed)     Status: Abnormal   Collection Time: 07/19/23  6:10 PM  Result Value Ref Range Status   Enterococcus faecalis NOT DETECTED NOT DETECTED Final   Enterococcus Faecium NOT DETECTED NOT DETECTED Final   Listeria monocytogenes NOT DETECTED NOT DETECTED Final   Staphylococcus species NOT DETECTED NOT DETECTED Final   Staphylococcus aureus (BCID) NOT DETECTED NOT DETECTED Final   Staphylococcus epidermidis NOT DETECTED NOT DETECTED Final   Staphylococcus lugdunensis NOT DETECTED NOT DETECTED Final   Streptococcus species NOT DETECTED NOT DETECTED Final   Streptococcus agalactiae NOT DETECTED NOT DETECTED Final   Streptococcus pneumoniae NOT DETECTED NOT DETECTED Final   Streptococcus pyogenes NOT DETECTED NOT DETECTED Final   A.calcoaceticus-baumannii NOT DETECTED NOT DETECTED Final   Bacteroides fragilis NOT DETECTED NOT DETECTED Final   Enterobacterales DETECTED (A) NOT DETECTED Final    Comment: Enterobacterales represent a large order of gram negative bacteria, not a single organism. CRITICAL RESULT CALLED TO, READ BACK BY AND VERIFIED WITH: E. MARTIN PHARMD, AT 1114 07/20/23 D. VANHOOK    Enterobacter cloacae complex NOT DETECTED NOT DETECTED Final   Escherichia coli NOT DETECTED NOT DETECTED Final   Klebsiella aerogenes NOT DETECTED NOT DETECTED Final   Klebsiella oxytoca NOT DETECTED NOT DETECTED Final   Klebsiella pneumoniae NOT DETECTED NOT DETECTED Final   Proteus species DETECTED (A) NOT DETECTED Final    Comment: CRITICAL RESULT CALLED TO, READ BACK BY  AND VERIFIED WITH: E. MARTIN PHARMD, AT 1114 07/20/23 D. VANHOOK    Salmonella species NOT DETECTED NOT DETECTED Final   Serratia marcescens NOT DETECTED NOT DETECTED Final   Haemophilus influenzae NOT DETECTED NOT DETECTED Final   Neisseria meningitidis NOT DETECTED NOT DETECTED Final   Pseudomonas aeruginosa NOT DETECTED NOT DETECTED Final   Stenotrophomonas maltophilia NOT DETECTED NOT DETECTED Final   Candida albicans NOT DETECTED NOT DETECTED Final   Candida auris NOT DETECTED NOT DETECTED Final   Candida glabrata NOT DETECTED NOT DETECTED Final   Candida krusei NOT DETECTED NOT DETECTED Final   Candida parapsilosis NOT  DETECTED NOT DETECTED Final   Candida tropicalis NOT DETECTED NOT DETECTED Final   Cryptococcus neoformans/gattii NOT DETECTED NOT DETECTED Final   CTX-M ESBL NOT DETECTED NOT DETECTED Final   Carbapenem resistance IMP NOT DETECTED NOT DETECTED Final   Carbapenem resistance KPC NOT DETECTED NOT DETECTED Final   Carbapenem resistance NDM NOT DETECTED NOT DETECTED Final   Carbapenem resist OXA 48 LIKE NOT DETECTED NOT DETECTED Final   Carbapenem resistance VIM NOT DETECTED NOT DETECTED Final    Comment: Performed at [facility], [address] [place], [place] [address]  MRSA Next Gen by PCR, Nasal     Status: None   Collection Time: 07/19/23  9:05 PM   Specimen: Nasal Mucosa; Nasal Swab  Result Value Ref Range Status   MRSA by PCR Next Gen NOT DETECTED NOT DETECTED Final    Comment: (NOTE) The GeneXpert MRSA Assay (FDA approved for NASAL specimens only), is one component of a comprehensive MRSA colonization surveillance program. It is not intended to diagnose MRSA infection nor to guide or monitor treatment for MRSA infections. Test performance is not FDA approved in patients less than 78 years old. Performed at [facility], [address], [place], [place] [address]     Radiology Reports DG Chest [place] 1 View  Result Date: 07/21/2023 CLINICAL  DATA:  77 year old female with history of shortness of breath. Respiratory failure. EXAM: PORTABLE CHEST 1 VIEW COMPARISON:  Chest x-ray 07/20/2023. FINDINGS: Lung volumes are low. Moderate left pleural effusion, new compared to the prior examination. Opacity at the left base which may reflect atelectasis and/or consolidation. Widespread interstitial prominence and peribronchial cuffing noted elsewhere throughout the lungs bilaterally. No definite right pleural effusion. No pneumothorax. The patient is rotated to the left on today's exam, resulting in severe distortion of cardiomediastinal contours and reduced diagnostic sensitivity and specificity for mediastinal pathology. Atherosclerotic calcifications in the thoracic aorta. Status post right shoulder arthroplasty. Multiple old healed bilateral rib fractures are incidentally noted. IMPRESSION: 1. New area of atelectasis and/or consolidation in the left lung base with superimposed moderate left pleural effusion. 2. Diffuse interstitial prominence and peribronchial cuffing, concerning for a background of bronchitis. 3. Aortic atherosclerosis. Electronically Signed   By: Trudie Reed M.D.   On: 07/21/2023 07:51   VAS [place] LOWER EXTREMITY VENOUS (DVT)  Result Date: 07/20/2023  Lower Venous DVT Study Patient Name:  [name]  Date of Exam:   07/20/2023 Medical Rec #: [id number]         Accession #:    [id number] Date of Birth: 03-19-1946         Patient Gender: F Patient Age:   25 years Exam Location:  [facility] Procedure:      VAS [place] LOWER EXTREMITY VENOUS (DVT) Referring Phys: Lyn Hollingshead MELVIN --------------------------------------------------------------------------------  Other Indications: Positive D dimer. Risk Factors: Immobility None identified. Comparison Study: No prior study Performing Technologist: Shona Simpson  Examination Guidelines: A complete evaluation includes B-mode imaging, spectral Doppler, color Doppler, and power Doppler as  needed of all accessible portions of each vessel. Bilateral testing is considered an integral part of a complete examination. Limited examinations for reoccurring indications may be performed as noted. The reflux portion of the exam is performed with the patient in reverse Trendelenburg.  +---------+---------------+---------+-----------+----------+--------------+ RIGHT    CompressibilityPhasicitySpontaneityPropertiesThrombus Aging +---------+---------------+---------+-----------+----------+--------------+ CFV      Full           Yes      Yes                                 +---------+---------------+---------+-----------+----------+--------------+  SFJ      Full                                                        +---------+---------------+---------+-----------+----------+--------------+ FV Prox  Full                                                        +---------+---------------+---------+-----------+----------+--------------+ FV Mid   Full                                                        +---------+---------------+---------+-----------+----------+--------------+ FV DistalFull                                                        +---------+---------------+---------+-----------+----------+--------------+ PFV      Full                                                        +---------+---------------+---------+-----------+----------+--------------+ POP      Full           Yes      Yes                                 +---------+---------------+---------+-----------+----------+--------------+ PTV      Full                                                        +---------+---------------+---------+-----------+----------+--------------+ PERO     Full                                                        +---------+---------------+---------+-----------+----------+--------------+    +---------+---------------+---------+-----------+----------+--------------+ LEFT     CompressibilityPhasicitySpontaneityPropertiesThrombus Aging +---------+---------------+---------+-----------+----------+--------------+ CFV      Full           Yes      Yes                                 +---------+---------------+---------+-----------+----------+--------------+ SFJ      Full                                                        +---------+---------------+---------+-----------+----------+--------------+  FV Prox  Full                                                        +---------+---------------+---------+-----------+----------+--------------+ FV Mid   Full                                                        +---------+---------------+---------+-----------+----------+--------------+ FV DistalFull                                                        +---------+---------------+---------+-----------+----------+--------------+ PFV      Full                                                        +---------+---------------+---------+-----------+----------+--------------+ POP      Full           Yes      Yes                                 +---------+---------------+---------+-----------+----------+--------------+ PTV      Full                                                        +---------+---------------+---------+-----------+----------+--------------+ PERO     Full                                                        +---------+---------------+---------+-----------+----------+--------------+     Summary: BILATERAL: - No evidence of deep vein thrombosis seen in the lower extremities, bilaterally. -No evidence of popliteal cyst, bilaterally.   *See table(s) above for measurements and observations. Electronically signed by Gerarda Fraction on 07/20/2023 at 7:39:45 PM.    Final    ECHOCARDIOGRAM COMPLETE  Result Date: 07/20/2023     ECHOCARDIOGRAM REPORT   Patient Name:   [name] Date of Exam: 07/20/2023 Medical Rec #:  [id number]        Height:       63.0 in Accession #:    [id number]       Weight:       139.1 lb Date of Birth:  1946/04/07        BSA:          1.657 m Patient Age:    77 years         BP:           95/65 mmHg Patient Gender: F  HR:           65 bpm. Exam Location:  Inpatient Procedure: 2D Echo, Color Doppler and Cardiac Doppler Indications:    CHF- Acute Diastolic  History:        Patient has no prior history of Echocardiogram examinations.                 Mitral Valve Prolapse.  Sonographer:    Raeford Razor Referring Phys: Stanford Scotland [facility]  Sonographer Comments: Image acquisition challenging due to patient body habitus. IMPRESSIONS  1. Left ventricular ejection fraction, by estimation, is 50 to 55%. The left ventricle has low normal function. The left ventricle has no regional wall motion abnormalities. Left ventricular diastolic parameters are consistent with Grade II diastolic dysfunction (pseudonormalization).  2. Right ventricular systolic function is normal. The right ventricular size is normal.  3. The mitral valve was not well visualized. No evidence of mitral valve regurgitation.  4. The aortic valve is tricuspid. Aortic valve regurgitation is not visualized.  5. The inferior vena cava is normal in size with greater than 50% respiratory variability, suggesting right atrial pressure of 3 mmHg. FINDINGS  Left Ventricle: Left ventricular ejection fraction, by estimation, is 50 to 55%. The left ventricle has low normal function. The left ventricle has no regional wall motion abnormalities. The left ventricular internal cavity size was normal in size. There is no left ventricular hypertrophy. Left ventricular diastolic parameters are consistent with Grade II diastolic dysfunction (pseudonormalization). Right Ventricle: The right ventricular size is normal. Right ventricular systolic function is normal. Left  Atrium: Left atrial size was normal in size. Right Atrium: Right atrial size was normal in size. Pericardium: There is no evidence of pericardial effusion. Mitral Valve: The mitral valve was not well visualized. No evidence of mitral valve regurgitation. Tricuspid Valve: The tricuspid valve is normal in structure. Tricuspid valve regurgitation is mild. Aortic Valve: The aortic valve is tricuspid. Aortic valve regurgitation is not visualized. Aortic valve mean gradient measures 3.0 mmHg. Aortic valve peak gradient measures 5.7 mmHg. Aortic valve area, by VTI measures 3.67 cm. Pulmonic Valve: The pulmonic valve was not well visualized. Pulmonic valve regurgitation is not visualized. Aorta: The aortic root and ascending aorta are structurally normal, with no evidence of dilitation. Venous: The inferior vena cava is normal in size with greater than 50% respiratory variability, suggesting right atrial pressure of 3 mmHg. IAS/Shunts: No atrial level shunt detected by color flow Doppler.  LEFT VENTRICLE PLAX 2D LVIDd:         3.90 cm     Diastology LVIDs:         2.90 cm     LV e' medial:    7.83 cm/s LV PW:         0.90 cm     LV E/e' medial:  16.1 LV IVS:        0.90 cm     LV e' lateral:   10.80 cm/s LVOT diam:     2.20 cm     LV E/e' lateral: 11.7 LV SV:         94 LV SV Index:   56 LVOT Area:     3.80 cm  LV Volumes (MOD) LV vol d, MOD A2C: 49.2 ml LV vol d, MOD A4C: 53.1 ml LV vol s, MOD A2C: 24.4 ml LV vol s, MOD A4C: 26.4 ml LV SV MOD A2C:     24.8 ml LV SV MOD A4C:  53.1 ml LV SV MOD BP:      26.4 ml RIGHT VENTRICLE             IVC RV Basal diam:  2.40 cm     IVC diam: 2.00 cm RV S prime:     12.10 cm/s TAPSE (M-mode): 2.4 cm LEFT ATRIUM           Index        RIGHT ATRIUM          Index LA diam:      2.90 cm 1.75 cm/m   RA Area:     7.92 cm LA Vol (A2C): 16.8 ml 10.14 ml/m  RA Volume:   11.20 ml 6.76 ml/m LA Vol (A4C): 16.1 ml 9.72 ml/m  AORTIC VALVE AV Area (Vmax):    3.23 cm AV Area (Vmean):   3.39  cm AV Area (VTI):     3.67 cm AV Vmax:           119.00 cm/s AV Vmean:          81.400 cm/s AV VTI:            0.255 m AV Peak Grad:      5.7 mmHg AV Mean Grad:      3.0 mmHg LVOT Vmax:         101.00 cm/s LVOT Vmean:        72.500 cm/s LVOT VTI:          0.246 m LVOT/AV VTI ratio: 0.96  AORTA Ao Asc diam: 3.00 cm MITRAL VALVE                TRICUSPID VALVE MV Area (PHT): 4.63 cm     TR Peak grad:   26.0 mmHg MV Decel Time: 164 msec     TR Vmax:        255.00 cm/s MV E velocity: 126.00 cm/s MV A velocity: 88.80 cm/s   SHUNTS MV E/A ratio:  1.42         Systemic VTI:  0.25 m                             Systemic Diam: 2.20 cm Carolan Clines Electronically signed by Carolan Clines Signature Date/Time: 07/20/2023/1:00:16 PM    Final    NM Pulmonary Perfusion  Result Date: 07/20/2023 CLINICAL DATA:  Pulmonary embolism (PE) suspected, high prob EXAM: NUCLEAR MEDICINE PERFUSION LUNG SCAN TECHNIQUE: Perfusion images were obtained in multiple projections after intravenous injection of radiopharmaceutical. Ventilation scans intentionally deferred if perfusion scan and chest x-ray adequate for interpretation during COVID 19 epidemic. RADIOPHARMACEUTICALS:  4.1 mCi Tc-65m MAA IV COMPARISON:  Chest x-ray 07/20/2023 FINDINGS: Relatively homogeneous distribution of radiopharmaceutical within both lungs. No segmental or larger perfusion defect. IMPRESSION: Low probability for pulmonary embolism. Electronically Signed   By: Duanne Guess D.O.   On: 07/20/2023 12:25   DG Chest Port 1 View  Result Date: 07/20/2023 CLINICAL DATA:  77 year old female with history of shortness of breath. EXAM: PORTABLE CHEST 1 VIEW COMPARISON:  Chest x-ray 07/19/2023. FINDINGS: Lung volumes are normal. No consolidative airspace disease. No pleural effusions. No pneumothorax. No pulmonary nodule or mass noted. Pulmonary vasculature and the cardiomediastinal silhouette are within normal limits. Atherosclerosis in the thoracic aorta. Status post right  shoulder arthroplasty. Old healed right-sided rib fractures are incidentally noted. IMPRESSION: 1.  No radiographic evidence of acute cardiopulmonary disease. 2. Aortic atherosclerosis. Electronically Signed  By: Trudie Reed M.D.   On: 07/20/2023 06:34   CT CHEST ABDOMEN PELVIS WO CONTRAST  Result Date: 07/19/2023 CLINICAL DATA:  Chest pain hypoxia and cough. Nausea vomiting and abdominal pain. EXAM: CT CHEST, ABDOMEN AND PELVIS WITHOUT CONTRAST TECHNIQUE: Multidetector CT imaging of the chest, abdomen and pelvis was performed following the standard protocol without IV contrast. RADIATION DOSE REDUCTION: This exam was performed according to the departmental dose-optimization program which includes automated exposure control, adjustment of the mA and/or kV according to patient size and/or use of iterative reconstruction technique. COMPARISON:  CT urogram 09/08/2022 FINDINGS: CT CHEST FINDINGS Cardiovascular: The heart size is normal. No substantial pericardial effusion. Coronary artery calcification is evident. Mild atherosclerotic calcification is noted in the wall of the thoracic aorta. Mediastinum/Nodes: No mediastinal lymphadenopathy. No evidence for gross hilar lymphadenopathy although assessment is limited by the lack of intravenous contrast on the current study. Small to moderate hiatal hernia. The esophagus has normal imaging features. There is no axillary lymphadenopathy. Lungs/Pleura: Dependent collapse/consolidation noted in the lung bases, left greater than right. No pulmonary edema or substantial pleural effusion. No suspicious pulmonary nodule or mass. Musculoskeletal: No worrisome lytic or sclerotic osseous abnormality. Right shoulder replacement CT ABDOMEN PELVIS FINDINGS Hepatobiliary: No suspicious focal abnormality in the liver on this study without intravenous contrast. 7 mm calcified gallstone evident. No intrahepatic or extrahepatic biliary dilation. Pancreas: No focal mass lesion. No  dilatation of the main duct. No intraparenchymal cyst. No peripancreatic edema. Spleen: No splenomegaly. No suspicious focal mass lesion. Adrenals/Urinary Tract: No adrenal nodule or mass. Right kidney and ureter unremarkable. 7 mm nonobstructing stone lower pole left kidney. No left ureteral stone. No left hydroureteronephrosis. The urinary bladder appears normal for the degree of distention. Stomach/Bowel: Small to moderate hiatal hernia. Stomach otherwise unremarkable. Duodenum is normally positioned as is the ligament of Treitz. No small bowel wall thickening. No small bowel dilatation. The terminal ileum is normal. The appendix is normal. Colon is decompressed except for the rectum which is distended up to 7 cm diameter with large stool volume evident. Vascular/Lymphatic: There is mild atherosclerotic calcification of the abdominal aorta without aneurysm. There is no gastrohepatic or hepatoduodenal ligament lymphadenopathy. No retroperitoneal or mesenteric lymphadenopathy. No pelvic sidewall lymphadenopathy. Reproductive: Hysterectomy.  There is no adnexal mass. Other: No intraperitoneal free fluid. Musculoskeletal: Bones are diffusely demineralized. Stable L5 compression deformity. IMPRESSION: 1. Dependent collapse/consolidation in the lung bases, left greater than right. Imaging features to likely related to atelectasis although pneumonia not entirely excluded. 2. Small to moderate hiatal hernia. 3. Cholelithiasis. 4. 7 mm nonobstructing stone lower pole left kidney. 5. Large stool volume in the rectum with distention up to 7 cm diameter. 6.  Aortic Atherosclerosis (ICD10-I70.0). Electronically Signed   By: Kennith Center M.D.   On: 07/19/2023 16:39   US Abdomen Limited RUQ (LIVER/GB)  Result Date: 07/19/2023 CLINICAL DATA:  [id number] Epigastric abdominal pain [id number] EXAM: ULTRASOUND ABDOMEN LIMITED RIGHT UPPER QUADRANT COMPARISON:  CT chest 07/09/2022 FINDINGS: Gallbladder: Gallbladder wall thickening up to  6.6 mm. Nonmobile cyst 9 mm gallstone in the gallbladder neck. No pericholecystic fluid. Sonographer describes sonographic Murphy's sign. Common bile duct: Diameter: 2.6 mm.  No intrahepatic biliary ductal dilatation. Liver: No focal lesion identified. Within normal limits in parenchymal echogenicity. Portal vein is patent on color Doppler imaging with normal direction of blood flow towards the liver. Other: Technologist describes technically difficult study secondary to body habitus. IMPRESSION: Cholelithiasis with gallbladder wall thickening and positive sonographic Murphy's  sign, suggesting acute cholecystitis. Electronically Signed   By: Corlis Leak M.D.   On: 07/19/2023 16:31   DG Chest Port 1 View  Result Date: 07/19/2023 CLINICAL DATA:  sob EXAM: PORTABLE CHEST 1 VIEW COMPARISON:  CXR 07/10/23 FINDINGS: Assessment of the bilateral upper lobes is limited due to patient positioning. Within this limitation, no pleural effusion. No pneumothorax. Linear opacity in the right lung base that appears unchanged from prior exam and most likely represents atelectasis. Unchanged cardiac and mediastinal contours. No new focal airspace opacity. No radiographically apparent displaced rib fractures. Visualized upper abdomen unremarkable. Right shoulder arthroplasty. IMPRESSION: Right basilar atelectasis. Electronically Signed   By: Lorenza Cambridge M.D.   On: 07/19/2023 13:33      Signature  -   Susa Raring M.D on 07/21/2023 at 8:29 AM   -  To page go to [email or web address]

## 2023-07-22 DIAGNOSIS — K810 Acute cholecystitis: Secondary | ICD-10-CM | POA: Diagnosis not present

## 2023-07-22 DIAGNOSIS — J9601 Acute respiratory failure with hypoxia: Secondary | ICD-10-CM | POA: Diagnosis not present

## 2023-07-22 MED ORDER — POLYETHYLENE GLYCOL 3350 17 G PO PACK: 17.0000 g | PACK | Freq: Two times a day (BID) | ORAL | Status: DC

## 2023-07-22 MED ORDER — DOCUSATE SODIUM 100 MG PO CAPS: 100.0000 mg | ORAL_CAPSULE | Freq: Every day | ORAL | Status: DC | PRN

## 2023-07-22 NOTE — Plan of Care (Signed)

## 2023-07-22 NOTE — Progress Notes (Signed)
PROGRESS NOTE                                                                                                                                                                                                             Patient Demographics:    [name], is a 77 y.o. female, DOB - 1946/09/23, WUJ:[id number]  Outpatient Primary MD for the patient is Karie Schwalbe, MD    LOS - 2  Admit date - 07/19/2023    Chief Complaint  Patient presents with   Nausea   Emesis   Back Pain   Chest Pain       Brief Narrative (HPI from H&P)      77 y.o. female with medical history significant of MS, paraplegia, neurogenic bladder/bowel, spasticity, hyperlipidemia, BPPV, cervical spinal stenosis presenting with multiple complaints including nausea, vomiting, chest pain, back pain.  Consistent with sepsis due to acute cholecystitis she was admitted to the hospital, seen by general surgery.   Subjective:    [name] today denies chest pain, denies shortness of breath, she reports good BM multiple episodes over last 24 hours, she was encouraged use incentive spirometry and flutter valve    Assessment  & Plan :    Sepsis present on admission due to acute cholecystitis, and bProteus bacteremia.   -General Surgery input greatly appreciated, status post laparoscopic cholecystectomy. -Blood cultures and intraoperative culture growing Proteus, continue with IV antibiotics, patient remain significantly cytosis, continue with IV antibiotics, hopefully can transition to oral once leukocytosis significantly improved  Acute hypoxic respiratory failure at the time of admission.  -  Currently chest pain-free stable on 3 L nasal cannula oxygen, to be increased effort due to sepsis, nausea vomiting and possible mild aspiration pneumonia, Zosyn suffice,  Elevated D-dimers  - likely is due to cholecystitis however prudent to rule out PE,  allergic to IV dye hence - VQ scan stat on 07/20/2023 along with lower extremity venous duplex and echocardiogram.  HX of MS, cervical stenosis, chronic functional paraplegia, neurogenic bladder, chronic generalized weakness worse in bilateral lower extremities along with chronic lower extremity contractures due to multiple different musculoskeletal issues according to the patient, largely bed and wheelchair-bound for the last 15 years.  Supportive care.  Hypokalemia.  Replaced       Condition - Extremely Guarded  Family Communication  :  son [name] [phone]  - 07/20/23  Code Status :  Full  Consults  :  CCS, PCCM  PUD Prophylaxis : PPI   Procedures  :     VQ.  Low probability  TTE - 1. Left ventricular ejection fraction, by estimation, is 50 to 55%. The left ventricle has low normal function. The left ventricle has no regional wall motion abnormalities. Left ventricular diastolic parameters are consistent with Grade II diastolic dysfunction (pseudonormalization).  2. Right ventricular systolic function is normal. The right ventricular size is normal.  3. The mitral valve was not well visualized. No evidence of mitral valve regurgitation.  4. The aortic valve is tricuspid. Aortic valve regurgitation is not visualized.  5. The inferior vena cava is normal in size with greater than 50% respiratory variability, suggesting right atrial pressure of 3 mmHg  Leg [place] no DVT  CT scan abdomen pelvis.  1. Dependent collapse/consolidation in the lung bases, left greater than right. Imaging features to likely related to atelectasis although pneumonia not entirely excluded. 2. Small to moderate hiatal hernia. 3. Cholelithiasis. 4. 7 mm nonobstructing stone lower pole left kidney. 5. Large stool volume in the rectum with distention up to 7 cm diameter. 6.  Aortic Atherosclerosis (ICD10-I70.0).  RUQ [place] -positive Murphy sign      Disposition Plan  :      DVT Prophylaxis  :  Heparin   Lab  Results  Component Value Date   PLT 214 07/22/2023    Diet :  Diet Order             Diet regular Fluid consistency: Thin  Diet effective now                    Inpatient Medications  Scheduled Meds:  feeding supplement  237 mL Oral BID BM   heparin injection (subcutaneous)  5,000 Units Subcutaneous Q8H   metroNIDAZOLE  500 mg Oral Q12H   pantoprazole  40 mg Oral Q1200   sodium chloride flush  3 mL Intravenous Q12H   Continuous Infusions:  cefTRIAXone (ROCEPHIN)  IV 2 g (07/21/23 1515)   methocarbamol (ROBAXIN) IV     PRN Meds:.acetaminophen **OR** acetaminophen, docusate sodium, HYDROmorphone (DILAUDID) injection, methocarbamol (ROBAXIN) IV, ondansetron (ZOFRAN) IV, oxyCODONE, polyethylene glycol, prochlorperazine, sodium phosphate  Antibiotics  :    Anti-infectives (From admission, onward)    Start     Dose/Rate Route Frequency Ordered Stop   07/20/23 2200  metroNIDAZOLE (FLAGYL) tablet 500 mg        500 mg Oral Every 12 hours 07/20/23 1153     07/20/23 1600  cefTRIAXone (ROCEPHIN) 2 g in sodium chloride 0.9 % 100 mL IVPB        2 g 200 mL/hr over 30 Minutes Intravenous Every 24 hours 07/20/23 1153     07/20/23 0200  piperacillin-tazobactam (ZOSYN) IVPB 3.375 g  Status:  Discontinued       Placed in "Followed by" Linked Group   3.375 g 12.5 mL/hr over 240 Minutes Intravenous Every 8 hours 07/19/23 1834 07/20/23 1153   07/19/23 1845  piperacillin-tazobactam (ZOSYN) IVPB 3.375 g       Placed in "Followed by" Linked Group   3.375 g 100 mL/hr over 30 Minutes Intravenous  Once 07/19/23 1834 07/19/23 1954   07/19/23 1715  piperacillin-tazobactam (ZOSYN) IVPB 3.375 g  Status:  Discontinued        3.375 g 100 mL/hr over 30 Minutes Intravenous  Once 07/19/23 1701 07/19/23  1834         Objective:   Vitals:   07/22/23 0316 07/22/23 0500 07/22/23 0801 07/22/23 1200  BP: (!) 99/58     Pulse: 69     Resp: 16     Temp: 98.6 F (37 C)   98.7 F (37.1 C)   TempSrc: Oral  Oral Oral  SpO2: 97%     Weight:  65.8 kg    Height:        Wt Readings from Last 3 Encounters:  07/22/23 65.8 kg  10/07/22 59.9 kg  07/09/22 54.4 kg    No intake or output data in the 24 hours ending 07/22/23 1357    Physical Exam  Awake Alert, No new F.N deficits, chronic weakness worse in bilateral lower extremities, largely bed and wheelchair-bound for 15 years, chronic lower extremity contractures Symmetrical Chest wall movement, creased air entry at the bases, she was encouraged to use incentive spirometer. RRR,No Gallops,Rubs or new Murmurs, No Parasternal Heave +ve B.Sounds, Abd Soft, spectated postop tenderness, surgical incision C/D/I No Cyanosis, Clubbing or edema, No new Rash or bruise       Data Review:    Recent Labs  Lab 07/19/23 1204 07/20/23 0425 07/21/23 0558 07/22/23 0106  WBC 23.1* 39.4* 22.9* 26.9*  HGB 14.3 12.2 12.0 11.6*  HCT 44.3 37.2 36.5 34.7*  PLT 412* 271 220 214  MCV 93.5 90.7 91.3 93.0  MCH 30.2 29.8 30.0 31.1  MCHC 32.3 32.8 32.9 33.4  RDW 12.4 12.7 12.9 12.7  LYMPHSABS 0.4*  --  1.0 1.3  MONOABS 0.1  --  0.6 1.3*  EOSABS 0.0  --  0.0 0.0  BASOSABS 0.0  --  0.0 0.0    Recent Labs  Lab 07/19/23 1204 07/19/23 1342 07/20/23 0425 07/20/23 0602 07/20/23 0657 07/21/23 0553 07/21/23 0558 07/22/23 0106  NA 141  --  137  --   --   --  139 138  K 4.4  --  3.4*  --   --   --  3.8 3.5  CL 108  --  107  --   --   --  111 110  CO2 18*  --  20*  --   --   --  17* 21*  ANIONGAP 15  --  10  --   --   --  11 7  GLUCOSE 128*  --  108*  --   --   --  112* 170*  BUN 19  --  18  --   --   --  13 19  CREATININE 0.73  --  0.88  --   --   --  0.76 0.79  AST 53*  --  36  --   --   --  33 21  ALT 26  --  26  --   --   --  23 19  ALKPHOS 108  --  91  --   --   --  82 89  BILITOT 1.7*  --  1.1  --   --   --  0.7 0.4  ALBUMIN 3.3*  --  2.5*  --   --   --  2.3* 2.1*  CRP  --   --   --   --  17.7*  --  18.9* 9.1*  DDIMER  --   >20.00*  --   --   --   --   --   --   PROCALCITON  --   --   --  57.93  --   --  35.11 29.39  LATICACIDVEN  --   --   --  2.5*  --   --   --   --   TSH  --   --  0.959  --   --  1.028  --   --   BNP  --   --   --   --   --   --  1,098.9* 517.6*  MG  --   --   --   --   --   --  1.6* 2.6*  CALCIUM 9.0  --  8.0*  --   --   --  7.7* 8.1*      Recent Labs  Lab 07/19/23 1204 07/19/23 1342 07/20/23 0425 07/20/23 0602 07/20/23 0657 07/21/23 0553 07/21/23 0558 07/22/23 0106  CRP  --   --   --   --  17.7*  --  18.9* 9.1*  DDIMER  --  >20.00*  --   --   --   --   --   --   PROCALCITON  --   --   --  57.93  --   --  35.11 29.39  LATICACIDVEN  --   --   --  2.5*  --   --   --   --   TSH  --   --  0.959  --   --  1.028  --   --   BNP  --   --   --   --   --   --  1,098.9* 517.6*  MG  --   --   --   --   --   --  1.6* 2.6*  CALCIUM 9.0  --  8.0*  --   --   --  7.7* 8.1*    Recent Labs    07/21/23 0553  TSH 1.028     Micro Results Recent Results (from the past 240 hour(s))  Resp panel by RT-PCR (RSV, Flu A&B, Covid) Anterior Nasal Swab     Status: None   Collection Time: 07/19/23  3:25 PM   Specimen: Anterior Nasal Swab  Result Value Ref Range Status   SARS Coronavirus 2 by RT PCR NEGATIVE NEGATIVE Final   Influenza A by PCR NEGATIVE NEGATIVE Final   Influenza B by PCR NEGATIVE NEGATIVE Final    Comment: (NOTE) The Xpert Xpress SARS-CoV-2/FLU/RSV plus assay is intended as an aid in the diagnosis of influenza from Nasopharyngeal swab specimens and should not be used as a sole basis for treatment. Nasal washings and aspirates are unacceptable for Xpert Xpress SARS-CoV-2/FLU/RSV testing.  Fact Sheet for Patients: BloggerCourse.com  Fact Sheet for Healthcare Providers: SeriousBroker.it  This test is not yet approved or cleared by the [place] FDA and has been authorized for detection and/or diagnosis of SARS-CoV-2 by FDA  under an Emergency Use Authorization (EUA). This EUA will remain in effect (meaning this test can be used) for the duration of the COVID-19 declaration under Section 564(b)(1) of the Act, 21 U.S.C. section 360bbb-3(b)(1), unless the authorization is terminated or revoked.     Resp Syncytial Virus by PCR NEGATIVE NEGATIVE Final    Comment: (NOTE) Fact Sheet for Patients: BloggerCourse.com  Fact Sheet for Healthcare Providers: SeriousBroker.it  This test is not yet approved or cleared by the [place] FDA and has been authorized for detection and/or diagnosis of SARS-CoV-2 by FDA under an Emergency Use Authorization (EUA). This EUA will  remain in effect (meaning this test can be used) for the duration of the COVID-19 declaration under Section 564(b)(1) of the Act, 21 U.S.C. section 360bbb-3(b)(1), unless the authorization is terminated or revoked.  Performed at [facility], [address] [place], [place] [address]   Blood culture (routine x 2)     Status: Abnormal   Collection Time: 07/19/23  6:10 PM   Specimen: BLOOD  Result Value Ref Range Status   Specimen Description BLOOD BLOOD RIGHT ARM  Final   Special Requests   Final    BOTTLES DRAWN AEROBIC AND ANAEROBIC Blood Culture results may not be optimal due to an inadequate volume of blood received in culture bottles   Culture  Setup Time   Final    GRAM NEGATIVE RODS IN BOTH AEROBIC AND ANAEROBIC BOTTLES CRITICAL RESULT CALLED TO, READ BACK BY AND VERIFIED WITH: E. MARTIN PHARMD, AT 1123 07/20/23 D. VANHOOK    Culture (A)  Final    PROTEUS MIRABILIS SUSCEPTIBILITIES PERFORMED ON PREVIOUS CULTURE WITHIN THE LAST 5 DAYS. Performed at [facility], [address], [place], [place] [address]    Report Status 07/22/2023 FINAL  Final  Blood culture (routine x 2)     Status: Abnormal   Collection Time: 07/19/23  6:10 PM   Specimen: BLOOD  Result Value Ref Range  Status   Specimen Description BLOOD BLOOD RIGHT ARM  Final   Special Requests   Final    BOTTLES DRAWN AEROBIC AND ANAEROBIC Blood Culture results may not be optimal due to an inadequate volume of blood received in culture bottles   Culture  Setup Time   Final    GRAM NEGATIVE RODS IN BOTH AEROBIC AND ANAEROBIC BOTTLES Organism ID to follow CRITICAL RESULT CALLED TO, READ BACK BY AND VERIFIED WITHAntoine Primas PHARMD, AT 1114 07/20/23 Renato Shin Performed at [facility], [address] [place], [place] [address]    Culture PROTEUS MIRABILIS (A)  Final   Report Status 07/22/2023 FINAL  Final   Organism ID, Bacteria PROTEUS MIRABILIS  Final      Susceptibility   Proteus mirabilis - MIC*    AMPICILLIN <=2 SENSITIVE Sensitive     CEFEPIME <=0.12 SENSITIVE Sensitive     CEFTAZIDIME <=1 SENSITIVE Sensitive     CEFTRIAXONE <=0.25 SENSITIVE Sensitive     CIPROFLOXACIN <=0.25 SENSITIVE Sensitive     GENTAMICIN <=1 SENSITIVE Sensitive     IMIPENEM 2 SENSITIVE Sensitive     TRIMETH/SULFA <=20 SENSITIVE Sensitive     AMPICILLIN/SULBACTAM <=2 SENSITIVE Sensitive     PIP/TAZO <=4 SENSITIVE Sensitive     * PROTEUS MIRABILIS  Blood Culture ID Panel (Reflexed)     Status: Abnormal   Collection Time: 07/19/23  6:10 PM  Result Value Ref Range Status   Enterococcus faecalis NOT DETECTED NOT DETECTED Final   Enterococcus Faecium NOT DETECTED NOT DETECTED Final   Listeria monocytogenes NOT DETECTED NOT DETECTED Final   Staphylococcus species NOT DETECTED NOT DETECTED Final   Staphylococcus aureus (BCID) NOT DETECTED NOT DETECTED Final   Staphylococcus epidermidis NOT DETECTED NOT DETECTED Final   Staphylococcus lugdunensis NOT DETECTED NOT DETECTED Final   Streptococcus species NOT DETECTED NOT DETECTED Final   Streptococcus agalactiae NOT DETECTED NOT DETECTED Final   Streptococcus pneumoniae NOT DETECTED NOT DETECTED Final   Streptococcus pyogenes NOT DETECTED NOT DETECTED Final    A.calcoaceticus-baumannii NOT DETECTED NOT DETECTED Final   Bacteroides fragilis NOT DETECTED NOT  DETECTED Final   Enterobacterales DETECTED (A) NOT DETECTED Final    Comment: Enterobacterales represent a large order of gram negative bacteria, not a single organism. CRITICAL RESULT CALLED TO, READ BACK BY AND VERIFIED WITH: E. MARTIN PHARMD, AT 1114 07/20/23 D. VANHOOK    Enterobacter cloacae complex NOT DETECTED NOT DETECTED Final   Escherichia coli NOT DETECTED NOT DETECTED Final   Klebsiella aerogenes NOT DETECTED NOT DETECTED Final   Klebsiella oxytoca NOT DETECTED NOT DETECTED Final   Klebsiella pneumoniae NOT DETECTED NOT DETECTED Final   Proteus species DETECTED (A) NOT DETECTED Final    Comment: CRITICAL RESULT CALLED TO, READ BACK BY AND VERIFIED WITH: E. MARTIN PHARMD, AT 1114 07/20/23 D. VANHOOK    Salmonella species NOT DETECTED NOT DETECTED Final   Serratia marcescens NOT DETECTED NOT DETECTED Final   Haemophilus influenzae NOT DETECTED NOT DETECTED Final   Neisseria meningitidis NOT DETECTED NOT DETECTED Final   Pseudomonas aeruginosa NOT DETECTED NOT DETECTED Final   Stenotrophomonas maltophilia NOT DETECTED NOT DETECTED Final   Candida albicans NOT DETECTED NOT DETECTED Final   Candida auris NOT DETECTED NOT DETECTED Final   Candida glabrata NOT DETECTED NOT DETECTED Final   Candida krusei NOT DETECTED NOT DETECTED Final   Candida parapsilosis NOT DETECTED NOT DETECTED Final   Candida tropicalis NOT DETECTED NOT DETECTED Final   Cryptococcus neoformans/gattii NOT DETECTED NOT DETECTED Final   CTX-M ESBL NOT DETECTED NOT DETECTED Final   Carbapenem resistance IMP NOT DETECTED NOT DETECTED Final   Carbapenem resistance KPC NOT DETECTED NOT DETECTED Final   Carbapenem resistance NDM NOT DETECTED NOT DETECTED Final   Carbapenem resist OXA 48 LIKE NOT DETECTED NOT DETECTED Final   Carbapenem resistance VIM NOT DETECTED NOT DETECTED Final    Comment: Performed at [facility], [address], [place], [place] [address]  MRSA Next Gen by PCR, Nasal     Status: None   Collection Time: 07/19/23  9:05 PM   Specimen: Nasal Mucosa; Nasal Swab  Result Value Ref Range Status   MRSA by PCR Next Gen NOT DETECTED NOT DETECTED Final    Comment: (NOTE) The GeneXpert MRSA Assay (FDA approved for NASAL specimens only), is one component of a comprehensive MRSA colonization surveillance program. It is not intended to diagnose MRSA infection nor to guide or monitor treatment for MRSA infections. Test performance is not FDA approved in patients less than 23 years old. Performed at [facility], [address], [place], [place] [address]     Radiology Reports DG Chest [place] 1 View  Result Date: 07/21/2023 CLINICAL DATA:  77 year old female with history of shortness of breath. Respiratory failure. EXAM: PORTABLE CHEST 1 VIEW COMPARISON:  Chest x-ray 07/20/2023. FINDINGS: Lung volumes are low. Moderate left pleural effusion, new compared to the prior examination. Opacity at the left base which may reflect atelectasis and/or consolidation. Widespread interstitial prominence and peribronchial cuffing noted elsewhere throughout the lungs bilaterally. No definite right pleural effusion. No pneumothorax. The patient is rotated to the left on today's exam, resulting in severe distortion of cardiomediastinal contours and reduced diagnostic sensitivity and specificity for mediastinal pathology. Atherosclerotic calcifications in the thoracic aorta. Status post right shoulder arthroplasty. Multiple old healed bilateral rib fractures are incidentally noted. IMPRESSION: 1. New area of atelectasis and/or consolidation in the left lung base with superimposed moderate left pleural effusion. 2. Diffuse interstitial prominence and peribronchial cuffing, concerning for a background of bronchitis. 3. Aortic atherosclerosis. Electronically Signed  By: Trudie Reed M.D.   On: 07/21/2023 07:51    VAS [place] LOWER EXTREMITY VENOUS (DVT)  Result Date: 07/20/2023  Lower Venous DVT Study Patient Name:  SHAHARA WINGERT  Date of Exam:   07/20/2023 Medical Rec #: [id number]         Accession #:    [id number] Date of Birth: [birth year]         Patient Gender: F Patient Age:   47 years Exam Location:  [facility] Procedure:      VAS [place] LOWER EXTREMITY VENOUS (DVT) Referring Phys: Lyn Hollingshead MELVIN --------------------------------------------------------------------------------  Other Indications: Positive D dimer. Risk Factors: Immobility None identified. Comparison Study: No prior study Performing Technologist: Shona Simpson  Examination Guidelines: A complete evaluation includes B-mode imaging, spectral Doppler, color Doppler, and power Doppler as needed of all accessible portions of each vessel. Bilateral testing is considered an integral part of a complete examination. Limited examinations for reoccurring indications may be performed as noted. The reflux portion of the exam is performed with the patient in reverse Trendelenburg.  +---------+---------------+---------+-----------+----------+--------------+ RIGHT    CompressibilityPhasicitySpontaneityPropertiesThrombus Aging +---------+---------------+---------+-----------+----------+--------------+ CFV      Full           Yes      Yes                                 +---------+---------------+---------+-----------+----------+--------------+ SFJ      Full                                                        +---------+---------------+---------+-----------+----------+--------------+ FV Prox  Full                                                        +---------+---------------+---------+-----------+----------+--------------+ FV Mid   Full                                                        +---------+---------------+---------+-----------+----------+--------------+ FV DistalFull                                                         +---------+---------------+---------+-----------+----------+--------------+ PFV      Full                                                        +---------+---------------+---------+-----------+----------+--------------+ POP      Full           Yes      Yes                                 +---------+---------------+---------+-----------+----------+--------------+  PTV      Full                                                        +---------+---------------+---------+-----------+----------+--------------+ PERO     Full                                                        +---------+---------------+---------+-----------+----------+--------------+   +---------+---------------+---------+-----------+----------+--------------+ LEFT     CompressibilityPhasicitySpontaneityPropertiesThrombus Aging +---------+---------------+---------+-----------+----------+--------------+ CFV      Full           Yes      Yes                                 +---------+---------------+---------+-----------+----------+--------------+ SFJ      Full                                                        +---------+---------------+---------+-----------+----------+--------------+ FV Prox  Full                                                        +---------+---------------+---------+-----------+----------+--------------+ FV Mid   Full                                                        +---------+---------------+---------+-----------+----------+--------------+ FV DistalFull                                                        +---------+---------------+---------+-----------+----------+--------------+ PFV      Full                                                        +---------+---------------+---------+-----------+----------+--------------+ POP      Full           Yes      Yes                                  +---------+---------------+---------+-----------+----------+--------------+ PTV      Full                                                        +---------+---------------+---------+-----------+----------+--------------+  PERO     Full                                                        +---------+---------------+---------+-----------+----------+--------------+     Summary: BILATERAL: - No evidence of deep vein thrombosis seen in the lower extremities, bilaterally. -No evidence of popliteal cyst, bilaterally.   *See table(s) above for measurements and observations. Electronically signed by Gerarda Fraction on 07/20/2023 at 7:39:45 PM.    Final    ECHOCARDIOGRAM COMPLETE  Result Date: 07/20/2023    ECHOCARDIOGRAM REPORT   Patient Name:   EMERAL RHIM Date of Exam: 07/20/2023 Medical Rec #:  [id number]        Height:       63.0 in Accession #:    [id number]       Weight:       139.1 lb Date of Birth:  14-Oct-1946        BSA:          1.657 m Patient Age:    77 years         BP:           95/65 mmHg Patient Gender: F                HR:           65 bpm. Exam Location:  Inpatient Procedure: 2D Echo, Color Doppler and Cardiac Doppler Indications:    CHF- Acute Diastolic  History:        Patient has no prior history of Echocardiogram examinations.                 Mitral Valve Prolapse.  Sonographer:    Raeford Razor Referring Phys: Stanford Scotland [facility]  Sonographer Comments: Image acquisition challenging due to patient body habitus. IMPRESSIONS  1. Left ventricular ejection fraction, by estimation, is 50 to 55%. The left ventricle has low normal function. The left ventricle has no regional wall motion abnormalities. Left ventricular diastolic parameters are consistent with Grade II diastolic dysfunction (pseudonormalization).  2. Right ventricular systolic function is normal. The right ventricular size is normal.  3. The mitral valve was not well visualized. No evidence of mitral valve regurgitation.  4. The aortic  valve is tricuspid. Aortic valve regurgitation is not visualized.  5. The inferior vena cava is normal in size with greater than 50% respiratory variability, suggesting right atrial pressure of 3 mmHg. FINDINGS  Left Ventricle: Left ventricular ejection fraction, by estimation, is 50 to 55%. The left ventricle has low normal function. The left ventricle has no regional wall motion abnormalities. The left ventricular internal cavity size was normal in size. There is no left ventricular hypertrophy. Left ventricular diastolic parameters are consistent with Grade II diastolic dysfunction (pseudonormalization). Right Ventricle: The right ventricular size is normal. Right ventricular systolic function is normal. Left Atrium: Left atrial size was normal in size. Right Atrium: Right atrial size was normal in size. Pericardium: There is no evidence of pericardial effusion. Mitral Valve: The mitral valve was not well visualized. No evidence of mitral valve regurgitation. Tricuspid Valve: The tricuspid valve is normal in structure. Tricuspid valve regurgitation is mild. Aortic Valve: The aortic valve is tricuspid. Aortic valve regurgitation is not visualized. Aortic valve mean gradient measures 3.0 mmHg. Aortic valve peak gradient measures 5.7  mmHg. Aortic valve area, by VTI measures 3.67 cm. Pulmonic Valve: The pulmonic valve was not well visualized. Pulmonic valve regurgitation is not visualized. Aorta: The aortic root and ascending aorta are structurally normal, with no evidence of dilitation. Venous: The inferior vena cava is normal in size with greater than 50% respiratory variability, suggesting right atrial pressure of 3 mmHg. IAS/Shunts: No atrial level shunt detected by color flow Doppler.  LEFT VENTRICLE PLAX 2D LVIDd:         3.90 cm     Diastology LVIDs:         2.90 cm     LV e' medial:    7.83 cm/s LV PW:         0.90 cm     LV E/e' medial:  16.1 LV IVS:        0.90 cm     LV e' lateral:   10.80 cm/s LVOT diam:      2.20 cm     LV E/e' lateral: 11.7 LV SV:         94 LV SV Index:   56 LVOT Area:     3.80 cm  LV Volumes (MOD) LV vol d, MOD A2C: 49.2 ml LV vol d, MOD A4C: 53.1 ml LV vol s, MOD A2C: 24.4 ml LV vol s, MOD A4C: 26.4 ml LV SV MOD A2C:     24.8 ml LV SV MOD A4C:     53.1 ml LV SV MOD BP:      26.4 ml RIGHT VENTRICLE             IVC RV Basal diam:  2.40 cm     IVC diam: 2.00 cm RV S prime:     12.10 cm/s TAPSE (M-mode): 2.4 cm LEFT ATRIUM           Index        RIGHT ATRIUM          Index LA diam:      2.90 cm 1.75 cm/m   RA Area:     7.92 cm LA Vol (A2C): 16.8 ml 10.14 ml/m  RA Volume:   11.20 ml 6.76 ml/m LA Vol (A4C): 16.1 ml 9.72 ml/m  AORTIC VALVE AV Area (Vmax):    3.23 cm AV Area (Vmean):   3.39 cm AV Area (VTI):     3.67 cm AV Vmax:           119.00 cm/s AV Vmean:          81.400 cm/s AV VTI:            0.255 m AV Peak Grad:      5.7 mmHg AV Mean Grad:      3.0 mmHg LVOT Vmax:         101.00 cm/s LVOT Vmean:        72.500 cm/s LVOT VTI:          0.246 m LVOT/AV VTI ratio: 0.96  AORTA Ao Asc diam: 3.00 cm MITRAL VALVE                TRICUSPID VALVE MV Area (PHT): 4.63 cm     TR Peak grad:   26.0 mmHg MV Decel Time: 164 msec     TR Vmax:        255.00 cm/s MV E velocity: 126.00 cm/s MV A velocity: 88.80 cm/s   SHUNTS MV E/A ratio:  1.42         Systemic VTI:  0.25 m                             Systemic Diam: 2.20 cm Carolan Clines Electronically signed by Carolan Clines Signature Date/Time: 07/20/2023/1:00:16 PM    Final    NM Pulmonary Perfusion  Result Date: 07/20/2023 CLINICAL DATA:  Pulmonary embolism (PE) suspected, high prob EXAM: NUCLEAR MEDICINE PERFUSION LUNG SCAN TECHNIQUE: Perfusion images were obtained in multiple projections after intravenous injection of radiopharmaceutical. Ventilation scans intentionally deferred if perfusion scan and chest x-ray adequate for interpretation during COVID 19 epidemic. RADIOPHARMACEUTICALS:  4.1 mCi Tc-1m MAA IV COMPARISON:  Chest x-ray 07/20/2023 FINDINGS:  Relatively homogeneous distribution of radiopharmaceutical within both lungs. No segmental or larger perfusion defect. IMPRESSION: Low probability for pulmonary embolism. Electronically Signed   By: Duanne Guess D.O.   On: 07/20/2023 12:25   DG Chest Port 1 View  Result Date: 07/20/2023 CLINICAL DATA:  77 year old female with history of shortness of breath. EXAM: PORTABLE CHEST 1 VIEW COMPARISON:  Chest x-ray 07/19/2023. FINDINGS: Lung volumes are normal. No consolidative airspace disease. No pleural effusions. No pneumothorax. No pulmonary nodule or mass noted. Pulmonary vasculature and the cardiomediastinal silhouette are within normal limits. Atherosclerosis in the thoracic aorta. Status post right shoulder arthroplasty. Old healed right-sided rib fractures are incidentally noted. IMPRESSION: 1.  No radiographic evidence of acute cardiopulmonary disease. 2. Aortic atherosclerosis. Electronically Signed   By: Trudie Reed M.D.   On: 07/20/2023 06:34   CT CHEST ABDOMEN PELVIS WO CONTRAST  Result Date: 07/19/2023 CLINICAL DATA:  Chest pain hypoxia and cough. Nausea vomiting and abdominal pain. EXAM: CT CHEST, ABDOMEN AND PELVIS WITHOUT CONTRAST TECHNIQUE: Multidetector CT imaging of the chest, abdomen and pelvis was performed following the standard protocol without IV contrast. RADIATION DOSE REDUCTION: This exam was performed according to the departmental dose-optimization program which includes automated exposure control, adjustment of the mA and/or kV according to patient size and/or use of iterative reconstruction technique. COMPARISON:  CT urogram 09/08/2022 FINDINGS: CT CHEST FINDINGS Cardiovascular: The heart size is normal. No substantial pericardial effusion. Coronary artery calcification is evident. Mild atherosclerotic calcification is noted in the wall of the thoracic aorta. Mediastinum/Nodes: No mediastinal lymphadenopathy. No evidence for gross hilar lymphadenopathy although assessment is  limited by the lack of intravenous contrast on the current study. Small to moderate hiatal hernia. The esophagus has normal imaging features. There is no axillary lymphadenopathy. Lungs/Pleura: Dependent collapse/consolidation noted in the lung bases, left greater than right. No pulmonary edema or substantial pleural effusion. No suspicious pulmonary nodule or mass. Musculoskeletal: No worrisome lytic or sclerotic osseous abnormality. Right shoulder replacement CT ABDOMEN PELVIS FINDINGS Hepatobiliary: No suspicious focal abnormality in the liver on this study without intravenous contrast. 7 mm calcified gallstone evident. No intrahepatic or extrahepatic biliary dilation. Pancreas: No focal mass lesion. No dilatation of the main duct. No intraparenchymal cyst. No peripancreatic edema. Spleen: No splenomegaly. No suspicious focal mass lesion. Adrenals/Urinary Tract: No adrenal nodule or mass. Right kidney and ureter unremarkable. 7 mm nonobstructing stone lower pole left kidney. No left ureteral stone. No left hydroureteronephrosis. The urinary bladder appears normal for the degree of distention. Stomach/Bowel: Small to moderate hiatal hernia. Stomach otherwise unremarkable. Duodenum is normally positioned as is the ligament of Treitz. No small bowel wall thickening. No small bowel dilatation. The terminal ileum is normal. The appendix is normal. Colon is decompressed except for the rectum which is distended up to 7 cm  diameter with large stool volume evident. Vascular/Lymphatic: There is mild atherosclerotic calcification of the abdominal aorta without aneurysm. There is no gastrohepatic or hepatoduodenal ligament lymphadenopathy. No retroperitoneal or mesenteric lymphadenopathy. No pelvic sidewall lymphadenopathy. Reproductive: Hysterectomy.  There is no adnexal mass. Other: No intraperitoneal free fluid. Musculoskeletal: Bones are diffusely demineralized. Stable L5 compression deformity. IMPRESSION: 1. Dependent  collapse/consolidation in the lung bases, left greater than right. Imaging features to likely related to atelectasis although pneumonia not entirely excluded. 2. Small to moderate hiatal hernia. 3. Cholelithiasis. 4. 7 mm nonobstructing stone lower pole left kidney. 5. Large stool volume in the rectum with distention up to 7 cm diameter. 6.  Aortic Atherosclerosis (ICD10-I70.0). Electronically Signed   By: Kennith Center M.D.   On: 07/19/2023 16:39   US Abdomen Limited RUQ (LIVER/GB)  Result Date: 07/19/2023 CLINICAL DATA:  [id number] Epigastric abdominal pain [id number] EXAM: ULTRASOUND ABDOMEN LIMITED RIGHT UPPER QUADRANT COMPARISON:  CT chest 07/09/2022 FINDINGS: Gallbladder: Gallbladder wall thickening up to 6.6 mm. Nonmobile cyst 9 mm gallstone in the gallbladder neck. No pericholecystic fluid. Sonographer describes sonographic Murphy's sign. Common bile duct: Diameter: 2.6 mm.  No intrahepatic biliary ductal dilatation. Liver: No focal lesion identified. Within normal limits in parenchymal echogenicity. Portal vein is patent on color Doppler imaging with normal direction of blood flow towards the liver. Other: Technologist describes technically difficult study secondary to body habitus. IMPRESSION: Cholelithiasis with gallbladder wall thickening and positive sonographic Murphy's sign, suggesting acute cholecystitis. Electronically Signed   By: Corlis Leak M.D.   On: 07/19/2023 16:31   DG Chest Port 1 View  Result Date: 07/19/2023 CLINICAL DATA:  sob EXAM: PORTABLE CHEST 1 VIEW COMPARISON:  CXR 07/10/23 FINDINGS: Assessment of the bilateral upper lobes is limited due to patient positioning. Within this limitation, no pleural effusion. No pneumothorax. Linear opacity in the right lung base that appears unchanged from prior exam and most likely represents atelectasis. Unchanged cardiac and mediastinal contours. No new focal airspace opacity. No radiographically apparent displaced rib fractures. Visualized upper  abdomen unremarkable. Right shoulder arthroplasty. IMPRESSION: Right basilar atelectasis. Electronically Signed   By: Lorenza Cambridge M.D.   On: 07/19/2023 13:33      Signature  -   Huey Bienenstock M.D on 07/22/2023 at 1:57 PM   -  To page go to [email or web address]

## 2023-07-22 NOTE — TOC Initial Note (Addendum)
Transition of Care [facility]) - Initial/Assessment Note    Patient Details  Name: [name] MRN: [id number] Date of Birth: 19-May-1946  Transition of Care [facility]) CM/SW Contact:    Mearl Latin, LCSW Phone Number: 07/22/2023, 8:57 AM  Clinical Narrative:                 CSW left voicemail for Peggy at [facility] to provide update.   Peggy returned call and confirmed that patient is a [facility] long term care SNF resident.   Expected Discharge Plan: Skilled Nursing Facility Barriers to Discharge: Continued Medical Work up   Patient Goals and CMS Choice Patient states their goals for this hospitalization and ongoing recovery are:: Return to [employer]gov Compare Post Acute Care list provided to:: Patient Choice offered to / list presented to : Patient [place] ownership interest in [facility].provided to:: Patient    Expected Discharge Plan and Services In-house Referral: Clinical Social Work   Post Acute Care Choice: Skilled Nursing Facility Living arrangements for the past 2 months: Skilled Nursing Facility                                      Prior Living Arrangements/Services Living arrangements for the past 2 months: Skilled Nursing Facility Lives with:: Facility Resident Patient language and need for interpreter reviewed:: Yes Do you feel safe going back to the place where you live?: Yes      Need for Family Participation in Patient Care: No (Comment) Care giver support system in place?: No (comment)   Criminal Activity/Legal Involvement Pertinent to Current Situation/Hospitalization: No - Comment as needed  Activities of Daily Living   ADL Screening (condition at time of admission) Is the patient deaf or have difficulty hearing?: No Does the patient have difficulty seeing, even when wearing glasses/contacts?: No Does the patient have difficulty concentrating, remembering, or making decisions?: No Does the patient have difficulty  dressing or bathing?: Yes Does the patient have difficulty walking or climbing stairs?: Yes  Permission Sought/Granted Permission sought to share information with : Facility Industrial/product designer granted to share information with : Yes, Verbal Permission Granted     Permission granted to share info w AGENCY: [facility]        Emotional Assessment Appearance:: Appears stated age     Orientation: : Oriented to Self, Oriented to Place, Oriented to  Time, Oriented to Situation Alcohol / Substance Use: Not Applicable Psych Involvement: No (comment)  Admission diagnosis:  Acute cholecystitis [K81.0] Hypoxia [R09.02] Acute respiratory failure with hypoxia (HCC) [J96.01] Patient Active Problem List   Diagnosis Date Noted   Acute cholecystitis 07/21/2023   Severe sepsis (HCC) 07/21/2023   Acute respiratory failure with hypoxia (HCC) 07/19/2023   Urge incontinence 10/08/2022   Right ureteral stone 10/07/2022   Spasticity 01/30/2021   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   Dysconjugate gaze 04/03/2020   Benign paroxysmal positional vertigo due to bilateral vestibular disorder 12/23/2019   Vertigo    Neurogenic bowel    Neurogenic bladder    Multiple sclerosis exacerbation (HCC) 11/27/2019   Advance directive discussed with patient 05/17/2018   Paraplegia, unspecified (HCC) 07/02/2017   Recurrent UTI 07/02/2017   Cervical spinal stenosis 06/12/2016   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 ([address]), [place] - [address] [place] ([place]) [place] [address] Phone: [phone] Fax: [phone]  [facility] [phone] - [place], [place] - [address] [place] [address] Phone:  [phone] Fax: [phone]     Social Determinants of Health (SDOH) Social History: SDOH Screenings   Food Insecurity: No Food Insecurity (07/20/2023)  Housing: Low Risk  (07/20/2023)  Transportation Needs: No Transportation Needs (07/20/2023)  Utilities: Not At Risk (07/20/2023)  Depression (PHQ2-9): Low Risk  (02/10/2023)  Financial Resource Strain: Low Risk  (02/17/2023)  Stress: No Stress Concern Present (02/17/2023)  Tobacco Use: Low Risk  (07/20/2023)   SDOH Interventions: Food Insecurity Interventions: Intervention Not Indicated Housing Interventions: Intervention Not Indicated Transportation Interventions: Intervention Not Indicated Utilities Interventions: Intervention Not Indicated   Readmission Risk Interventions     No data to display

## 2023-07-22 NOTE — Progress Notes (Signed)
Progress Note  2 Days Post-Op  Subjective: Pt with multiple complaints regarding chronic medical conditions and her SNF. Tolerating diet although poor appetite. Having bowel function on colace. Discussed post-op care for laparoscopic cholecystectomy.  Objective: Vital signs in last 24 hours: Temp:  [97.7 F (36.5 C)-98.6 F (37 C)] 98.6 F (37 C) (08/07 0316) Pulse Rate:  [66-80] 69 (08/07 0316) Resp:  [14-22] 16 (08/07 0316) BP: (94-123)/(53-71) 99/58 (08/07 0316) SpO2:  [83 %-97 %] 97 % (08/07 0316) Weight:  [65.8 kg] 65.8 kg (08/07 0500) Last BM Date : 07/20/23  Intake/Output from previous day: No intake/output data recorded. Intake/Output this shift: No intake/output data recorded.  PE: General: pleasant, WD, deconditioned appearing female who is laying in bed in NAD Lungs: Respiratory effort nonlabored Abd: soft, appropriately ttp, incisions CDI, mild distention Psych: A&Ox3 with an appropriate affect.    Lab Results:  Recent Labs    07/21/23 0558 07/22/23 0106  WBC 22.9* 26.9*  HGB 12.0 11.6*  HCT 36.5 34.7*  PLT 220 214   BMET Recent Labs    07/21/23 0558 07/22/23 0106  NA 139 138  K 3.8 3.5  CL 111 110  CO2 17* 21*  GLUCOSE 112* 170*  BUN 13 19  CREATININE 0.76 0.79  CALCIUM 7.7* 8.1*   PT/INR No results for input(s): "LABPROT", "INR" in the last 72 hours. CMP     Component Value Date/Time   NA 138 07/22/2023 0106   K 3.5 07/22/2023 0106   CL 110 07/22/2023 0106   CO2 21 (L) 07/22/2023 0106   GLUCOSE 170 (H) 07/22/2023 0106   BUN 19 07/22/2023 0106   CREATININE 0.79 07/22/2023 0106   CREATININE 0.65 08/26/2016 1642   CALCIUM 8.1 (L) 07/22/2023 0106   PROT 4.9 (L) 07/22/2023 0106   ALBUMIN 2.1 (L) 07/22/2023 0106   AST 21 07/22/2023 0106   ALT 19 07/22/2023 0106   ALKPHOS 89 07/22/2023 0106   BILITOT 0.4 07/22/2023 0106   GFRNONAA >60 07/22/2023 0106   GFRAA >60 12/07/2019 0657   Lipase     Component Value Date/Time   LIPASE  36 07/19/2023 1204       Studies/Results: DG Chest Port 1 View  Result Date: 07/21/2023 CLINICAL DATA:  77 year old female with history of shortness of breath. Respiratory failure. EXAM: PORTABLE CHEST 1 VIEW COMPARISON:  Chest x-ray 07/20/2023. FINDINGS: Lung volumes are low. Moderate left pleural effusion, new compared to the prior examination. Opacity at the left base which may reflect atelectasis and/or consolidation. Widespread interstitial prominence and peribronchial cuffing noted elsewhere throughout the lungs bilaterally. No definite right pleural effusion. No pneumothorax. The patient is rotated to the left on today's exam, resulting in severe distortion of cardiomediastinal contours and reduced diagnostic sensitivity and specificity for mediastinal pathology. Atherosclerotic calcifications in the thoracic aorta. Status post right shoulder arthroplasty. Multiple old healed bilateral rib fractures are incidentally noted. IMPRESSION: 1. New area of atelectasis and/or consolidation in the left lung base with superimposed moderate left pleural effusion. 2. Diffuse interstitial prominence and peribronchial cuffing, concerning for a background of bronchitis. 3. Aortic atherosclerosis. Electronically Signed   By: Trudie Reed M.D.   On: 07/21/2023 07:51   VAS [place] LOWER EXTREMITY VENOUS (DVT)  Result Date: 07/20/2023  Lower Venous DVT Study Patient Name:  [name]  Date of Exam:   07/20/2023 Medical Rec #: [id number]         Accession #:    [id number] Date of Birth: 1946-02-11  Patient Gender: F Patient Age:   77 years Exam Location:  [facility] Procedure:      VAS [place] LOWER EXTREMITY VENOUS (DVT) Referring Phys: Lyn Hollingshead MELVIN --------------------------------------------------------------------------------  Other Indications: Positive D dimer. Risk Factors: Immobility None identified. Comparison Study: No prior study Performing Technologist: Shona Simpson  Examination  Guidelines: A complete evaluation includes B-mode imaging, spectral Doppler, color Doppler, and power Doppler as needed of all accessible portions of each vessel. Bilateral testing is considered an integral part of a complete examination. Limited examinations for reoccurring indications may be performed as noted. The reflux portion of the exam is performed with the patient in reverse Trendelenburg.  +---------+---------------+---------+-----------+----------+--------------+ RIGHT    CompressibilityPhasicitySpontaneityPropertiesThrombus Aging +---------+---------------+---------+-----------+----------+--------------+ CFV      Full           Yes      Yes                                 +---------+---------------+---------+-----------+----------+--------------+ SFJ      Full                                                        +---------+---------------+---------+-----------+----------+--------------+ FV Prox  Full                                                        +---------+---------------+---------+-----------+----------+--------------+ FV Mid   Full                                                        +---------+---------------+---------+-----------+----------+--------------+ FV DistalFull                                                        +---------+---------------+---------+-----------+----------+--------------+ PFV      Full                                                        +---------+---------------+---------+-----------+----------+--------------+ POP      Full           Yes      Yes                                 +---------+---------------+---------+-----------+----------+--------------+ PTV      Full                                                        +---------+---------------+---------+-----------+----------+--------------+  PERO     Full                                                         +---------+---------------+---------+-----------+----------+--------------+   +---------+---------------+---------+-----------+----------+--------------+ LEFT     CompressibilityPhasicitySpontaneityPropertiesThrombus Aging +---------+---------------+---------+-----------+----------+--------------+ CFV      Full           Yes      Yes                                 +---------+---------------+---------+-----------+----------+--------------+ SFJ      Full                                                        +---------+---------------+---------+-----------+----------+--------------+ FV Prox  Full                                                        +---------+---------------+---------+-----------+----------+--------------+ FV Mid   Full                                                        +---------+---------------+---------+-----------+----------+--------------+ FV DistalFull                                                        +---------+---------------+---------+-----------+----------+--------------+ PFV      Full                                                        +---------+---------------+---------+-----------+----------+--------------+ POP      Full           Yes      Yes                                 +---------+---------------+---------+-----------+----------+--------------+ PTV      Full                                                        +---------+---------------+---------+-----------+----------+--------------+ PERO     Full                                                        +---------+---------------+---------+-----------+----------+--------------+  Summary: BILATERAL: - No evidence of deep vein thrombosis seen in the lower extremities, bilaterally. -No evidence of popliteal cyst, bilaterally.   *See table(s) above for measurements and observations. Electronically signed by Gerarda Fraction on 07/20/2023 at 7:39:45 PM.     Final    ECHOCARDIOGRAM COMPLETE  Result Date: 07/20/2023    ECHOCARDIOGRAM REPORT   Patient Name:   [name] Date of Exam: 07/20/2023 Medical Rec #:  [id number]        Height:       63.0 in Accession #:    [id number]       Weight:       139.1 lb Date of Birth:  07-07-1946        BSA:          1.657 m Patient Age:    77 years         BP:           95/65 mmHg Patient Gender: F                HR:           65 bpm. Exam Location:  Inpatient Procedure: 2D Echo, Color Doppler and Cardiac Doppler Indications:    CHF- Acute Diastolic  History:        Patient has no prior history of Echocardiogram examinations.                 Mitral Valve Prolapse.  Sonographer:    Raeford Razor Referring Phys: [facility]  Sonographer Comments: Image acquisition challenging due to patient body habitus. IMPRESSIONS  1. Left ventricular ejection fraction, by estimation, is 50 to 55%. The left ventricle has low normal function. The left ventricle has no regional wall motion abnormalities. Left ventricular diastolic parameters are consistent with Grade II diastolic dysfunction (pseudonormalization).  2. Right ventricular systolic function is normal. The right ventricular size is normal.  3. The mitral valve was not well visualized. No evidence of mitral valve regurgitation.  4. The aortic valve is tricuspid. Aortic valve regurgitation is not visualized.  5. The inferior vena cava is normal in size with greater than 50% respiratory variability, suggesting right atrial pressure of 3 mmHg. FINDINGS  Left Ventricle: Left ventricular ejection fraction, by estimation, is 50 to 55%. The left ventricle has low normal function. The left ventricle has no regional wall motion abnormalities. The left ventricular internal cavity size was normal in size. There is no left ventricular hypertrophy. Left ventricular diastolic parameters are consistent with Grade II diastolic dysfunction (pseudonormalization). Right Ventricle: The right ventricular  size is normal. Right ventricular systolic function is normal. Left Atrium: Left atrial size was normal in size. Right Atrium: Right atrial size was normal in size. Pericardium: There is no evidence of pericardial effusion. Mitral Valve: The mitral valve was not well visualized. No evidence of mitral valve regurgitation. Tricuspid Valve: The tricuspid valve is normal in structure. Tricuspid valve regurgitation is mild. Aortic Valve: The aortic valve is tricuspid. Aortic valve regurgitation is not visualized. Aortic valve mean gradient measures 3.0 mmHg. Aortic valve peak gradient measures 5.7 mmHg. Aortic valve area, by VTI measures 3.67 cm. Pulmonic Valve: The pulmonic valve was not well visualized. Pulmonic valve regurgitation is not visualized. Aorta: The aortic root and ascending aorta are structurally normal, with no evidence of dilitation. Venous: The inferior vena cava is normal in size with greater than 50% respiratory variability, suggesting right atrial pressure of 3 mmHg. IAS/Shunts: No atrial level  shunt detected by color flow Doppler.  LEFT VENTRICLE PLAX 2D LVIDd:         3.90 cm     Diastology LVIDs:         2.90 cm     LV e' medial:    7.83 cm/s LV PW:         0.90 cm     LV E/e' medial:  16.1 LV IVS:        0.90 cm     LV e' lateral:   10.80 cm/s LVOT diam:     2.20 cm     LV E/e' lateral: 11.7 LV SV:         94 LV SV Index:   56 LVOT Area:     3.80 cm  LV Volumes (MOD) LV vol d, MOD A2C: 49.2 ml LV vol d, MOD A4C: 53.1 ml LV vol s, MOD A2C: 24.4 ml LV vol s, MOD A4C: 26.4 ml LV SV MOD A2C:     24.8 ml LV SV MOD A4C:     53.1 ml LV SV MOD BP:      26.4 ml RIGHT VENTRICLE             IVC RV Basal diam:  2.40 cm     IVC diam: 2.00 cm RV S prime:     12.10 cm/s TAPSE (M-mode): 2.4 cm LEFT ATRIUM           Index        RIGHT ATRIUM          Index LA diam:      2.90 cm 1.75 cm/m   RA Area:     7.92 cm LA Vol (A2C): 16.8 ml 10.14 ml/m  RA Volume:   11.20 ml 6.76 ml/m LA Vol (A4C): 16.1 ml 9.72 ml/m   AORTIC VALVE AV Area (Vmax):    3.23 cm AV Area (Vmean):   3.39 cm AV Area (VTI):     3.67 cm AV Vmax:           119.00 cm/s AV Vmean:          81.400 cm/s AV VTI:            0.255 m AV Peak Grad:      5.7 mmHg AV Mean Grad:      3.0 mmHg LVOT Vmax:         101.00 cm/s LVOT Vmean:        72.500 cm/s LVOT VTI:          0.246 m LVOT/AV VTI ratio: 0.96  AORTA Ao Asc diam: 3.00 cm MITRAL VALVE                TRICUSPID VALVE MV Area (PHT): 4.63 cm     TR Peak grad:   26.0 mmHg MV Decel Time: 164 msec     TR Vmax:        255.00 cm/s MV E velocity: 126.00 cm/s MV A velocity: 88.80 cm/s   SHUNTS MV E/A ratio:  1.42         Systemic VTI:  0.25 m                             Systemic Diam: 2.20 cm Carolan Clines Electronically signed by Carolan Clines Signature Date/Time: 07/20/2023/1:00:16 PM    Final    NM Pulmonary Perfusion  Result Date: 07/20/2023 CLINICAL DATA:  Pulmonary embolism (  PE) suspected, high prob EXAM: NUCLEAR MEDICINE PERFUSION LUNG SCAN TECHNIQUE: Perfusion images were obtained in multiple projections after intravenous injection of radiopharmaceutical. Ventilation scans intentionally deferred if perfusion scan and chest x-ray adequate for interpretation during COVID 19 epidemic. RADIOPHARMACEUTICALS:  4.1 mCi Tc-76m MAA IV COMPARISON:  Chest x-ray 07/20/2023 FINDINGS: Relatively homogeneous distribution of radiopharmaceutical within both lungs. No segmental or larger perfusion defect. IMPRESSION: Low probability for pulmonary embolism. Electronically Signed   By: Duanne Guess D.O.   On: 07/20/2023 12:25    Anti-infectives: Anti-infectives (From admission, onward)    Start     Dose/Rate Route Frequency Ordered Stop   07/20/23 2200  metroNIDAZOLE (FLAGYL) tablet 500 mg        500 mg Oral Every 12 hours 07/20/23 1153     07/20/23 1600  cefTRIAXone (ROCEPHIN) 2 g in sodium chloride 0.9 % 100 mL IVPB        2 g 200 mL/hr over 30 Minutes Intravenous Every 24 hours 07/20/23 1153     07/20/23 0200   piperacillin-tazobactam (ZOSYN) IVPB 3.375 g  Status:  Discontinued       Placed in "Followed by" Linked Group   3.375 g 12.5 mL/hr over 240 Minutes Intravenous Every 8 hours 07/19/23 1834 07/20/23 1153   07/19/23 1845  piperacillin-tazobactam (ZOSYN) IVPB 3.375 g       Placed in "Followed by" Linked Group   3.375 g 100 mL/hr over 30 Minutes Intravenous  Once 07/19/23 1834 07/19/23 1954   07/19/23 1715  piperacillin-tazobactam (ZOSYN) IVPB 3.375 g  Status:  Discontinued        3.375 g 100 mL/hr over 30 Minutes Intravenous  Once 07/19/23 1701 07/19/23 1834        Assessment/Plan  POD2 s/p laparoscopic cholecystectomy and fecal disimpaction  - tolerating diet and having bowel function  - incisions clean - stable for DC from surgical standpoint when medically cleared - follow up and instructions in AVS already - please call as needed if concerns    FEN: reg diet, ensure VTE: SQH ID: [id number]  - per TR[facility] -  Proteus bacteremia  MS Paraplegia Neurogenic bladder and bowel  HLD BPPV Cervical spine stenosis  LOS: 2 days     Juliet Rude, [facility] 07/22/2023, 9:29 AM Please see Amion for pager number during day hours 7:00am-4:30pm

## 2023-07-23 DIAGNOSIS — K810 Acute cholecystitis: Secondary | ICD-10-CM | POA: Diagnosis not present

## 2023-07-23 DIAGNOSIS — J9601 Acute respiratory failure with hypoxia: Secondary | ICD-10-CM | POA: Diagnosis not present

## 2023-07-23 MED ORDER — POTASSIUM CHLORIDE CRYS ER 20 MEQ PO TBCR
40.0000 meq | EXTENDED_RELEASE_TABLET | Freq: Four times a day (QID) | ORAL | Status: AC
  Administered 2023-07-23 (×2): 40 meq via ORAL
  Filled 2023-07-23 (×2): qty 2

## 2023-07-23 NOTE — Care Management Important Message (Signed)
Important Message  Patient Details  Name: [name] MRN: [id number] Date of Birth: 1946-05-15   Medicare Important Message Given:  Yes      Stefan Church 07/23/2023, 3:04 PM

## 2023-07-23 NOTE — Plan of Care (Signed)

## 2023-07-23 NOTE — Progress Notes (Signed)
PROGRESS NOTE                                                                                                                                                                                                             Patient Demographics:    [name], is a 77 y.o. female, DOB - June 08, 1946, RUE:[id number]  Outpatient Primary MD for the patient is Karie Schwalbe, MD    LOS - 3  Admit date - 07/19/2023    Chief Complaint  Patient presents with   Nausea   Emesis   Back Pain   Chest Pain       Brief Narrative (HPI from H&P)      77 y.o. female with medical history significant of MS, paraplegia, neurogenic bladder/bowel, spasticity, hyperlipidemia, BPPV, cervical spinal stenosis presenting with multiple complaints including nausea, vomiting, chest pain, back pain.  Consistent with sepsis due to acute cholecystitis she was admitted to the hospital, seen by general surgery. status post laparoscopic cholecystectomy.   Subjective:    [name] today denies chest pain, denies shortness of breath, no further diarrhea, she reports nausea this morning, reports she was feeling better yesterday.   Assessment  & Plan :    Sepsis present on admission due to acute cholecystitis, and Proteus bacteremia.   -General Surgery input greatly appreciated, status post laparoscopic cholecystectomy. -Blood cultures and intraoperative culture growing Proteus, continue with IV antibiotics, continue with IV Rocephin, I will DC IV Flagyl given her nausea, continue with IV antibiotics given she still having significant procalcitonin and leukocytosis (both are improving which is reassuring), likely can transition to oral regimen in couple days.    Acute hypoxic respiratory failure at the time of admission.  -  Currently chest pain-free stable on 3 L nasal cannula oxygen, she did drop to 86% on room air upon stopping her oxygen, she is back  on 2 L oxygen. -She was encouraged again to use incentive spirometer and flutter valve today.  Hypokalemia -Repleted  Elevated D-dimers  - likely is due to cholecystitis however prudent to rule out PE, allergic to IV dye hence - VQ scan stat on 07/20/2023 along with lower extremity venous duplex and echocardiogram.  HX of MS, cervical stenosis, chronic functional paraplegia, neurogenic bladder, chronic generalized weakness worse in bilateral lower extremities along with chronic lower extremity  contractures due to multiple different musculoskeletal issues according to the patient, largely bed and wheelchair-bound for the last 15 years.  Supportive care.  Hypokalemia.  Replaced       Condition - Extremely Guarded  Family Communication  :  none at bedside  Code Status :  Full  Consults  :  CCS, PCCM  PUD Prophylaxis : PPI   Procedures  :     VQ.  Low probability  TTE - 1. Left ventricular ejection fraction, by estimation, is 50 to 55%. The left ventricle has low normal function. The left ventricle has no regional wall motion abnormalities. Left ventricular diastolic parameters are consistent with Grade II diastolic dysfunction (pseudonormalization).  2. Right ventricular systolic function is normal. The right ventricular size is normal.  3. The mitral valve was not well visualized. No evidence of mitral valve regurgitation.  4. The aortic valve is tricuspid. Aortic valve regurgitation is not visualized.  5. The inferior vena cava is normal in size with greater than 50% respiratory variability, suggesting right atrial pressure of 3 mmHg  Leg [place] no DVT  CT scan abdomen pelvis.  1. Dependent collapse/consolidation in the lung bases, left greater than right. Imaging features to likely related to atelectasis although pneumonia not entirely excluded. 2. Small to moderate hiatal hernia. 3. Cholelithiasis. 4. 7 mm nonobstructing stone lower pole left kidney. 5. Large stool volume in the rectum  with distention up to 7 cm diameter. 6.  Aortic Atherosclerosis (ICD10-I70.0).  RUQ [place] -positive Murphy sign      Disposition Plan  :      DVT Prophylaxis  :  Heparin   Lab Results  Component Value Date   PLT 228 07/23/2023    Diet :  Diet Order             Diet regular Fluid consistency: Thin  Diet effective now                    Inpatient Medications  Scheduled Meds:  feeding supplement  237 mL Oral BID BM   heparin injection (subcutaneous)  5,000 Units Subcutaneous Q8H   pantoprazole  40 mg Oral Q1200   potassium chloride  40 mEq Oral Q6H   sodium chloride flush  3 mL Intravenous Q12H   Continuous Infusions:  cefTRIAXone (ROCEPHIN)  IV 2 g (07/22/23 1439)   methocarbamol (ROBAXIN) IV     PRN Meds:.acetaminophen **OR** acetaminophen, docusate sodium, HYDROmorphone (DILAUDID) injection, methocarbamol (ROBAXIN) IV, ondansetron (ZOFRAN) IV, oxyCODONE, polyethylene glycol, prochlorperazine, sodium phosphate  Antibiotics  :    Anti-infectives (From admission, onward)    Start     Dose/Rate Route Frequency Ordered Stop   07/20/23 2200  metroNIDAZOLE (FLAGYL) tablet 500 mg  Status:  Discontinued        500 mg Oral Every 12 hours 07/20/23 1153 07/23/23 1345   07/20/23 1600  cefTRIAXone (ROCEPHIN) 2 g in sodium chloride 0.9 % 100 mL IVPB        2 g 200 mL/hr over 30 Minutes Intravenous Every 24 hours 07/20/23 1153     07/20/23 0200  piperacillin-tazobactam (ZOSYN) IVPB 3.375 g  Status:  Discontinued       Placed in "Followed by" Linked Group   3.375 g 12.5 mL/hr over 240 Minutes Intravenous Every 8 hours 07/19/23 1834 07/20/23 1153   07/19/23 1845  piperacillin-tazobactam (ZOSYN) IVPB 3.375 g       Placed in "Followed by" Linked Group   3.375 g 100  mL/hr over 30 Minutes Intravenous  Once 07/19/23 1834 07/19/23 1954   07/19/23 1715  piperacillin-tazobactam (ZOSYN) IVPB 3.375 g  Status:  Discontinued        3.375 g 100 mL/hr over 30 Minutes Intravenous  Once  07/19/23 1701 07/19/23 1834         Objective:   Vitals:   07/23/23 0200 07/23/23 0400 07/23/23 0800 07/23/23 0900  BP:  (!) 133/58    Pulse: 72 75    Resp: 16 17    Temp:  98.3 F (36.8 C) 98.2 F (36.8 C)   TempSrc:  Oral Oral   SpO2: 97% 93%  94%  Weight:  65.8 kg    Height:        Wt Readings from Last 3 Encounters:  07/23/23 65.8 kg  10/07/22 59.9 kg  07/09/22 54.4 kg     Intake/Output Summary (Last 24 hours) at 07/23/2023 1345 Last data filed at 07/23/2023 1000 Gross per 24 hour  Intake 460 ml  Output 1500 ml  Net -1040 ml      Physical Exam  Awake Alert, No new F.N deficits, chronic weakness worse in bilateral lower extremities, largely bed and wheelchair-bound for 15 years, chronic lower extremity contractures Symmetrical Chest wall movement, decreased air entry at the bases RRR,No Gallops,Rubs or new Murmurs, No Parasternal Heave +ve B.Sounds, Abd Soft, No tenderness, No rebound - guarding or rigidity. No Cyanosis, Clubbing or edema, No new Rash or bruise       Data Review:    Recent Labs  Lab 07/19/23 1204 07/20/23 0425 07/21/23 0558 07/22/23 0106 07/23/23 0014  WBC 23.1* 39.4* 22.9* 26.9* 19.2*  HGB 14.3 12.2 12.0 11.6* 12.0  HCT 44.3 37.2 36.5 34.7* 36.8  PLT 412* 271 220 214 228  MCV 93.5 90.7 91.3 93.0 90.2  MCH 30.2 29.8 30.0 31.1 29.4  MCHC 32.3 32.8 32.9 33.4 32.6  RDW 12.4 12.7 12.9 12.7 12.5  LYMPHSABS 0.4*  --  1.0 1.3 3.2  MONOABS 0.1  --  0.6 1.3* 1.2*  EOSABS 0.0  --  0.0 0.0 0.2  BASOSABS 0.0  --  0.0 0.0 0.0    Recent Labs  Lab 07/19/23 1204 07/19/23 1342 07/20/23 0425 07/20/23 0602 07/20/23 0657 07/21/23 0553 07/21/23 0558 07/22/23 0106 07/23/23 0014  NA 141  --  137  --   --   --  139 138 142  K 4.4  --  3.4*  --   --   --  3.8 3.5 3.3*  CL 108  --  107  --   --   --  111 110 109  CO2 18*  --  20*  --   --   --  17* 21* 25  ANIONGAP 15  --  10  --   --   --  11 7 8   GLUCOSE 128*  --  108*  --   --   --   112* 170* 95  BUN 19  --  18  --   --   --  13 19 10   CREATININE 0.73  --  0.88  --   --   --  0.76 0.79 0.57  AST 53*  --  36  --   --   --  33 21 17  ALT 26  --  26  --   --   --  23 19 19   ALKPHOS 108  --  91  --   --   --  82 89 82  BILITOT 1.7*  --  1.1  --   --   --  0.7 0.4 0.4  ALBUMIN 3.3*  --  2.5*  --   --   --  2.3* 2.1* 2.3*  CRP  --   --   --   --  17.7*  --  18.9* 9.1* 3.4*  DDIMER  --  >20.00*  --   --   --   --   --   --   --   PROCALCITON  --   --   --  57.93  --   --  35.11 29.39 16.80  LATICACIDVEN  --   --   --  2.5*  --   --   --   --   --   TSH  --   --  0.959  --   --  1.028  --   --   --   BNP  --   --   --   --   --   --  1,098.9* 517.6* 451.7*  MG  --   --   --   --   --   --  1.6* 2.6* 1.9  CALCIUM 9.0  --  8.0*  --   --   --  7.7* 8.1* 8.2*      Recent Labs  Lab 07/19/23 1204 07/19/23 1342 07/20/23 0425 07/20/23 0602 07/20/23 0657 07/21/23 0553 07/21/23 0558 07/22/23 0106 07/23/23 0014  CRP  --   --   --   --  17.7*  --  18.9* 9.1* 3.4*  DDIMER  --  >20.00*  --   --   --   --   --   --   --   PROCALCITON  --   --   --  57.93  --   --  35.11 29.39 16.80  LATICACIDVEN  --   --   --  2.5*  --   --   --   --   --   TSH  --   --  0.959  --   --  1.028  --   --   --   BNP  --   --   --   --   --   --  1,098.9* 517.6* 451.7*  MG  --   --   --   --   --   --  1.6* 2.6* 1.9  CALCIUM 9.0  --  8.0*  --   --   --  7.7* 8.1* 8.2*    Recent Labs    07/21/23 0553  TSH 1.028     Micro Results Recent Results (from the past 240 hour(s))  Resp panel by RT-PCR (RSV, Flu A&B, Covid) Anterior Nasal Swab     Status: None   Collection Time: 07/19/23  3:25 PM   Specimen: Anterior Nasal Swab  Result Value Ref Range Status   SARS Coronavirus 2 by RT PCR NEGATIVE NEGATIVE Final   Influenza A by PCR NEGATIVE NEGATIVE Final   Influenza B by PCR NEGATIVE NEGATIVE Final    Comment: (NOTE) The Xpert Xpress SARS-CoV-2/FLU/RSV plus assay is intended as an aid in the  diagnosis of influenza from Nasopharyngeal swab specimens and should not be used as a sole basis for treatment. Nasal washings and aspirates are unacceptable for Xpert Xpress SARS-CoV-2/FLU/RSV testing.  Fact Sheet for Patients: BloggerCourse.com  Fact Sheet for Healthcare Providers: SeriousBroker.it  This test is not yet  approved or cleared by the [place] and has been authorized for detection and/or diagnosis of SARS-CoV-2 by FDA under an Emergency Use Authorization (EUA). This EUA will remain in effect (meaning this test can be used) for the duration of the COVID-19 declaration under Section 564(b)(1) of the Act, 21 U.S.C. section 360bbb-3(b)(1), unless the authorization is terminated or revoked.     Resp Syncytial Virus by PCR NEGATIVE NEGATIVE Final    Comment: (NOTE) Fact Sheet for Patients: BloggerCourse.com  Fact Sheet for Healthcare Providers: SeriousBroker.it  This test is not yet approved or cleared by the [place] FDA and has been authorized for detection and/or diagnosis of SARS-CoV-2 by FDA under an Emergency Use Authorization (EUA). This EUA will remain in effect (meaning this test can be used) for the duration of the COVID-19 declaration under Section 564(b)(1) of the Act, 21 U.S.C. section 360bbb-3(b)(1), unless the authorization is terminated or revoked.  Performed at [facility], [address] [place], [place] [address]   Blood culture (routine x 2)     Status: Abnormal   Collection Time: 07/19/23  6:10 PM   Specimen: BLOOD  Result Value Ref Range Status   Specimen Description BLOOD BLOOD RIGHT ARM  Final   Special Requests   Final    BOTTLES DRAWN AEROBIC AND ANAEROBIC Blood Culture results may not be optimal due to an inadequate volume of blood received in culture bottles   Culture  Setup Time   Final    GRAM NEGATIVE RODS IN  BOTH AEROBIC AND ANAEROBIC BOTTLES CRITICAL RESULT CALLED TO, READ BACK BY AND VERIFIED WITH: E. MARTIN PHARMD, AT 1123 07/20/23 D. VANHOOK    Culture (A)  Final    PROTEUS MIRABILIS SUSCEPTIBILITIES PERFORMED ON PREVIOUS CULTURE WITHIN THE LAST 5 DAYS. Performed at [facility], [address], [place], [place] [address]    Report Status 07/22/2023 FINAL  Final  Blood culture (routine x 2)     Status: Abnormal   Collection Time: 07/19/23  6:10 PM   Specimen: BLOOD  Result Value Ref Range Status   Specimen Description BLOOD BLOOD RIGHT ARM  Final   Special Requests   Final    BOTTLES DRAWN AEROBIC AND ANAEROBIC Blood Culture results may not be optimal due to an inadequate volume of blood received in culture bottles   Culture  Setup Time   Final    GRAM NEGATIVE RODS IN BOTH AEROBIC AND ANAEROBIC BOTTLES Organism ID to follow CRITICAL RESULT CALLED TO, READ BACK BY AND VERIFIED WITHAntoine Primas PHARMD, AT 1114 07/20/23 Renato Shin Performed at [facility], [address], [place], [place] [address]    Culture PROTEUS MIRABILIS (A)  Final   Report Status 07/22/2023 FINAL  Final   Organism ID, Bacteria PROTEUS MIRABILIS  Final      Susceptibility   Proteus mirabilis - MIC*    AMPICILLIN <=2 SENSITIVE Sensitive     CEFEPIME <=0.12 SENSITIVE Sensitive     CEFTAZIDIME <=1 SENSITIVE Sensitive     CEFTRIAXONE <=0.25 SENSITIVE Sensitive     CIPROFLOXACIN <=0.25 SENSITIVE Sensitive     GENTAMICIN <=1 SENSITIVE Sensitive     IMIPENEM 2 SENSITIVE Sensitive     TRIMETH/SULFA <=20 SENSITIVE Sensitive     AMPICILLIN/SULBACTAM <=2 SENSITIVE Sensitive     PIP/TAZO <=4 SENSITIVE Sensitive     * PROTEUS MIRABILIS  Blood Culture ID Panel (Reflexed)     Status: Abnormal   Collection Time:  07/19/23  6:10 PM  Result Value Ref Range Status   Enterococcus faecalis NOT DETECTED NOT DETECTED Final   Enterococcus Faecium NOT DETECTED NOT DETECTED Final   Listeria monocytogenes NOT DETECTED NOT  DETECTED Final   Staphylococcus species NOT DETECTED NOT DETECTED Final   Staphylococcus aureus (BCID) NOT DETECTED NOT DETECTED Final   Staphylococcus epidermidis NOT DETECTED NOT DETECTED Final   Staphylococcus lugdunensis NOT DETECTED NOT DETECTED Final   Streptococcus species NOT DETECTED NOT DETECTED Final   Streptococcus agalactiae NOT DETECTED NOT DETECTED Final   Streptococcus pneumoniae NOT DETECTED NOT DETECTED Final   Streptococcus pyogenes NOT DETECTED NOT DETECTED Final   A.calcoaceticus-baumannii NOT DETECTED NOT DETECTED Final   Bacteroides fragilis NOT DETECTED NOT DETECTED Final   Enterobacterales DETECTED (A) NOT DETECTED Final    Comment: Enterobacterales represent a large order of gram negative bacteria, not a single organism. CRITICAL RESULT CALLED TO, READ BACK BY AND VERIFIED WITH: E. MARTIN PHARMD, AT 1114 07/20/23 D. VANHOOK    Enterobacter cloacae complex NOT DETECTED NOT DETECTED Final   Escherichia coli NOT DETECTED NOT DETECTED Final   Klebsiella aerogenes NOT DETECTED NOT DETECTED Final   Klebsiella oxytoca NOT DETECTED NOT DETECTED Final   Klebsiella pneumoniae NOT DETECTED NOT DETECTED Final   Proteus species DETECTED (A) NOT DETECTED Final    Comment: CRITICAL RESULT CALLED TO, READ BACK BY AND VERIFIED WITH: E. MARTIN PHARMD, AT 1114 07/20/23 D. VANHOOK    Salmonella species NOT DETECTED NOT DETECTED Final   Serratia marcescens NOT DETECTED NOT DETECTED Final   Haemophilus influenzae NOT DETECTED NOT DETECTED Final   Neisseria meningitidis NOT DETECTED NOT DETECTED Final   Pseudomonas aeruginosa NOT DETECTED NOT DETECTED Final   Stenotrophomonas maltophilia NOT DETECTED NOT DETECTED Final   Candida albicans NOT DETECTED NOT DETECTED Final   Candida auris NOT DETECTED NOT DETECTED Final   Candida glabrata NOT DETECTED NOT DETECTED Final   Candida krusei NOT DETECTED NOT DETECTED Final   Candida parapsilosis NOT DETECTED NOT DETECTED Final   Candida  tropicalis NOT DETECTED NOT DETECTED Final   Cryptococcus neoformans/gattii NOT DETECTED NOT DETECTED Final   CTX-M ESBL NOT DETECTED NOT DETECTED Final   Carbapenem resistance IMP NOT DETECTED NOT DETECTED Final   Carbapenem resistance KPC NOT DETECTED NOT DETECTED Final   Carbapenem resistance NDM NOT DETECTED NOT DETECTED Final   Carbapenem resist OXA 48 LIKE NOT DETECTED NOT DETECTED Final   Carbapenem resistance VIM NOT DETECTED NOT DETECTED Final    Comment: Performed at [facility], [address], [place], [place] [address]  MRSA Next Gen by PCR, Nasal     Status: None   Collection Time: 07/19/23  9:05 PM   Specimen: Nasal Mucosa; Nasal Swab  Result Value Ref Range Status   MRSA by PCR Next Gen NOT DETECTED NOT DETECTED Final    Comment: (NOTE) The GeneXpert MRSA Assay (FDA approved for NASAL specimens only), is one component of a comprehensive MRSA colonization surveillance program. It is not intended to diagnose MRSA infection nor to guide or monitor treatment for MRSA infections. Test performance is not FDA approved in patients less than 65 years old. Performed at [facility], [address] [place], [place] [address]     Radiology Reports DG Chest [place] 1 View  Result Date: 07/21/2023 CLINICAL DATA:  77 year old female with history of shortness of breath. Respiratory failure. EXAM: PORTABLE CHEST 1 VIEW COMPARISON:  Chest x-ray 07/20/2023. FINDINGS: Lung  volumes are low. Moderate left pleural effusion, new compared to the prior examination. Opacity at the left base which may reflect atelectasis and/or consolidation. Widespread interstitial prominence and peribronchial cuffing noted elsewhere throughout the lungs bilaterally. No definite right pleural effusion. No pneumothorax. The patient is rotated to the left on today's exam, resulting in severe distortion of cardiomediastinal contours and reduced diagnostic sensitivity and specificity for mediastinal pathology.  Atherosclerotic calcifications in the thoracic aorta. Status post right shoulder arthroplasty. Multiple old healed bilateral rib fractures are incidentally noted. IMPRESSION: 1. New area of atelectasis and/or consolidation in the left lung base with superimposed moderate left pleural effusion. 2. Diffuse interstitial prominence and peribronchial cuffing, concerning for a background of bronchitis. 3. Aortic atherosclerosis. Electronically Signed   By: Trudie Reed M.D.   On: 07/21/2023 07:51   VAS [place] LOWER EXTREMITY VENOUS (DVT)  Result Date: 07/20/2023  Lower Venous DVT Study Patient Name:  RIJA SHAMBAUGH  Date of Exam:   07/20/2023 Medical Rec #: [id number]         Accession #:    [id number] Date of Birth: 12/05/1946         Patient Gender: F Patient Age:   3 years Exam Location:  [facility] Procedure:      VAS [place] LOWER EXTREMITY VENOUS (DVT) Referring Phys: Lyn Hollingshead MELVIN --------------------------------------------------------------------------------  Other Indications: Positive D dimer. Risk Factors: Immobility None identified. Comparison Study: No prior study Performing Technologist: Shona Simpson  Examination Guidelines: A complete evaluation includes B-mode imaging, spectral Doppler, color Doppler, and power Doppler as needed of all accessible portions of each vessel. Bilateral testing is considered an integral part of a complete examination. Limited examinations for reoccurring indications may be performed as noted. The reflux portion of the exam is performed with the patient in reverse Trendelenburg.  +---------+---------------+---------+-----------+----------+--------------+ RIGHT    CompressibilityPhasicitySpontaneityPropertiesThrombus Aging +---------+---------------+---------+-----------+----------+--------------+ CFV      Full           Yes      Yes                                 +---------+---------------+---------+-----------+----------+--------------+ SFJ      Full                                                         +---------+---------------+---------+-----------+----------+--------------+ FV Prox  Full                                                        +---------+---------------+---------+-----------+----------+--------------+ FV Mid   Full                                                        +---------+---------------+---------+-----------+----------+--------------+ FV DistalFull                                                        +---------+---------------+---------+-----------+----------+--------------+  PFV      Full                                                        +---------+---------------+---------+-----------+----------+--------------+ POP      Full           Yes      Yes                                 +---------+---------------+---------+-----------+----------+--------------+ PTV      Full                                                        +---------+---------------+---------+-----------+----------+--------------+ PERO     Full                                                        +---------+---------------+---------+-----------+----------+--------------+   +---------+---------------+---------+-----------+----------+--------------+ LEFT     CompressibilityPhasicitySpontaneityPropertiesThrombus Aging +---------+---------------+---------+-----------+----------+--------------+ CFV      Full           Yes      Yes                                 +---------+---------------+---------+-----------+----------+--------------+ SFJ      Full                                                        +---------+---------------+---------+-----------+----------+--------------+ FV Prox  Full                                                        +---------+---------------+---------+-----------+----------+--------------+ FV Mid   Full                                                         +---------+---------------+---------+-----------+----------+--------------+ FV DistalFull                                                        +---------+---------------+---------+-----------+----------+--------------+ PFV      Full                                                        +---------+---------------+---------+-----------+----------+--------------+  POP      Full           Yes      Yes                                 +---------+---------------+---------+-----------+----------+--------------+ PTV      Full                                                        +---------+---------------+---------+-----------+----------+--------------+ PERO     Full                                                        +---------+---------------+---------+-----------+----------+--------------+     Summary: BILATERAL: - No evidence of deep vein thrombosis seen in the lower extremities, bilaterally. -No evidence of popliteal cyst, bilaterally.   *See table(s) above for measurements and observations. Electronically signed by Gerarda Fraction on 07/20/2023 at 7:39:45 PM.    Final    ECHOCARDIOGRAM COMPLETE  Result Date: 07/20/2023    ECHOCARDIOGRAM REPORT   Patient Name:   FERRIS HERMOSILLO Date of Exam: 07/20/2023 Medical Rec #:  [id number]        Height:       63.0 in Accession #:    [id number]       Weight:       139.1 lb Date of Birth:  1946-03-14        BSA:          1.657 m Patient Age:    77 years         BP:           95/65 mmHg Patient Gender: F                HR:           65 bpm. Exam Location:  Inpatient Procedure: 2D Echo, Color Doppler and Cardiac Doppler Indications:    CHF- Acute Diastolic  History:        Patient has no prior history of Echocardiogram examinations.                 Mitral Valve Prolapse.  Sonographer:    Raeford Razor Referring Phys: [facility]  Sonographer Comments: Image acquisition challenging due to patient body habitus. IMPRESSIONS  1. Left  ventricular ejection fraction, by estimation, is 50 to 55%. The left ventricle has low normal function. The left ventricle has no regional wall motion abnormalities. Left ventricular diastolic parameters are consistent with Grade II diastolic dysfunction (pseudonormalization).  2. Right ventricular systolic function is normal. The right ventricular size is normal.  3. The mitral valve was not well visualized. No evidence of mitral valve regurgitation.  4. The aortic valve is tricuspid. Aortic valve regurgitation is not visualized.  5. The inferior vena cava is normal in size with greater than 50% respiratory variability, suggesting right atrial pressure of 3 mmHg. FINDINGS  Left Ventricle: Left ventricular ejection fraction, by estimation, is 50 to 55%. The left ventricle has low normal function. The left ventricle has no regional wall motion abnormalities. The left ventricular  internal cavity size was normal in size. There is no left ventricular hypertrophy. Left ventricular diastolic parameters are consistent with Grade II diastolic dysfunction (pseudonormalization). Right Ventricle: The right ventricular size is normal. Right ventricular systolic function is normal. Left Atrium: Left atrial size was normal in size. Right Atrium: Right atrial size was normal in size. Pericardium: There is no evidence of pericardial effusion. Mitral Valve: The mitral valve was not well visualized. No evidence of mitral valve regurgitation. Tricuspid Valve: The tricuspid valve is normal in structure. Tricuspid valve regurgitation is mild. Aortic Valve: The aortic valve is tricuspid. Aortic valve regurgitation is not visualized. Aortic valve mean gradient measures 3.0 mmHg. Aortic valve peak gradient measures 5.7 mmHg. Aortic valve area, by VTI measures 3.67 cm. Pulmonic Valve: The pulmonic valve was not well visualized. Pulmonic valve regurgitation is not visualized. Aorta: The aortic root and ascending aorta are structurally  normal, with no evidence of dilitation. Venous: The inferior vena cava is normal in size with greater than 50% respiratory variability, suggesting right atrial pressure of 3 mmHg. IAS/Shunts: No atrial level shunt detected by color flow Doppler.  LEFT VENTRICLE PLAX 2D LVIDd:         3.90 cm     Diastology LVIDs:         2.90 cm     LV e' medial:    7.83 cm/s LV PW:         0.90 cm     LV E/e' medial:  16.1 LV IVS:        0.90 cm     LV e' lateral:   10.80 cm/s LVOT diam:     2.20 cm     LV E/e' lateral: 11.7 LV SV:         94 LV SV Index:   56 LVOT Area:     3.80 cm  LV Volumes (MOD) LV vol d, MOD A2C: 49.2 ml LV vol d, MOD A4C: 53.1 ml LV vol s, MOD A2C: 24.4 ml LV vol s, MOD A4C: 26.4 ml LV SV MOD A2C:     24.8 ml LV SV MOD A4C:     53.1 ml LV SV MOD BP:      26.4 ml RIGHT VENTRICLE             IVC RV Basal diam:  2.40 cm     IVC diam: 2.00 cm RV S prime:     12.10 cm/s TAPSE (M-mode): 2.4 cm LEFT ATRIUM           Index        RIGHT ATRIUM          Index LA diam:      2.90 cm 1.75 cm/m   RA Area:     7.92 cm LA Vol (A2C): 16.8 ml 10.14 ml/m  RA Volume:   11.20 ml 6.76 ml/m LA Vol (A4C): 16.1 ml 9.72 ml/m  AORTIC VALVE AV Area (Vmax):    3.23 cm AV Area (Vmean):   3.39 cm AV Area (VTI):     3.67 cm AV Vmax:           119.00 cm/s AV Vmean:          81.400 cm/s AV VTI:            0.255 m AV Peak Grad:      5.7 mmHg AV Mean Grad:      3.0 mmHg LVOT Vmax:  101.00 cm/s LVOT Vmean:        72.500 cm/s LVOT VTI:          0.246 m LVOT/AV VTI ratio: 0.96  AORTA Ao Asc diam: 3.00 cm MITRAL VALVE                TRICUSPID VALVE MV Area (PHT): 4.63 cm     TR Peak grad:   26.0 mmHg MV Decel Time: 164 msec     TR Vmax:        255.00 cm/s MV E velocity: 126.00 cm/s MV A velocity: 88.80 cm/s   SHUNTS MV E/A ratio:  1.42         Systemic VTI:  0.25 m                             Systemic Diam: 2.20 cm Carolan Clines Electronically signed by Carolan Clines Signature Date/Time: 07/20/2023/1:00:16 PM    Final    NM Pulmonary  Perfusion  Result Date: 07/20/2023 CLINICAL DATA:  Pulmonary embolism (PE) suspected, high prob EXAM: NUCLEAR MEDICINE PERFUSION LUNG SCAN TECHNIQUE: Perfusion images were obtained in multiple projections after intravenous injection of radiopharmaceutical. Ventilation scans intentionally deferred if perfusion scan and chest x-ray adequate for interpretation during COVID 19 epidemic. RADIOPHARMACEUTICALS:  4.1 mCi Tc-25m MAA IV COMPARISON:  Chest x-ray 07/20/2023 FINDINGS: Relatively homogeneous distribution of radiopharmaceutical within both lungs. No segmental or larger perfusion defect. IMPRESSION: Low probability for pulmonary embolism. Electronically Signed   By: Duanne Guess D.O.   On: 07/20/2023 12:25   DG Chest Port 1 View  Result Date: 07/20/2023 CLINICAL DATA:  77 year old female with history of shortness of breath. EXAM: PORTABLE CHEST 1 VIEW COMPARISON:  Chest x-ray 07/19/2023. FINDINGS: Lung volumes are normal. No consolidative airspace disease. No pleural effusions. No pneumothorax. No pulmonary nodule or mass noted. Pulmonary vasculature and the cardiomediastinal silhouette are within normal limits. Atherosclerosis in the thoracic aorta. Status post right shoulder arthroplasty. Old healed right-sided rib fractures are incidentally noted. IMPRESSION: 1.  No radiographic evidence of acute cardiopulmonary disease. 2. Aortic atherosclerosis. Electronically Signed   By: Trudie Reed M.D.   On: 07/20/2023 06:34   CT CHEST ABDOMEN PELVIS WO CONTRAST  Result Date: 07/19/2023 CLINICAL DATA:  Chest pain hypoxia and cough. Nausea vomiting and abdominal pain. EXAM: CT CHEST, ABDOMEN AND PELVIS WITHOUT CONTRAST TECHNIQUE: Multidetector CT imaging of the chest, abdomen and pelvis was performed following the standard protocol without IV contrast. RADIATION DOSE REDUCTION: This exam was performed according to the departmental dose-optimization program which includes automated exposure control,  adjustment of the mA and/or kV according to patient size and/or use of iterative reconstruction technique. COMPARISON:  CT urogram 09/08/2022 FINDINGS: CT CHEST FINDINGS Cardiovascular: The heart size is normal. No substantial pericardial effusion. Coronary artery calcification is evident. Mild atherosclerotic calcification is noted in the wall of the thoracic aorta. Mediastinum/Nodes: No mediastinal lymphadenopathy. No evidence for gross hilar lymphadenopathy although assessment is limited by the lack of intravenous contrast on the current study. Small to moderate hiatal hernia. The esophagus has normal imaging features. There is no axillary lymphadenopathy. Lungs/Pleura: Dependent collapse/consolidation noted in the lung bases, left greater than right. No pulmonary edema or substantial pleural effusion. No suspicious pulmonary nodule or mass. Musculoskeletal: No worrisome lytic or sclerotic osseous abnormality. Right shoulder replacement CT ABDOMEN PELVIS FINDINGS Hepatobiliary: No suspicious focal abnormality in the liver on this study without intravenous contrast. 7 mm calcified  gallstone evident. No intrahepatic or extrahepatic biliary dilation. Pancreas: No focal mass lesion. No dilatation of the main duct. No intraparenchymal cyst. No peripancreatic edema. Spleen: No splenomegaly. No suspicious focal mass lesion. Adrenals/Urinary Tract: No adrenal nodule or mass. Right kidney and ureter unremarkable. 7 mm nonobstructing stone lower pole left kidney. No left ureteral stone. No left hydroureteronephrosis. The urinary bladder appears normal for the degree of distention. Stomach/Bowel: Small to moderate hiatal hernia. Stomach otherwise unremarkable. Duodenum is normally positioned as is the ligament of Treitz. No small bowel wall thickening. No small bowel dilatation. The terminal ileum is normal. The appendix is normal. Colon is decompressed except for the rectum which is distended up to 7 cm diameter with large  stool volume evident. Vascular/Lymphatic: There is mild atherosclerotic calcification of the abdominal aorta without aneurysm. There is no gastrohepatic or hepatoduodenal ligament lymphadenopathy. No retroperitoneal or mesenteric lymphadenopathy. No pelvic sidewall lymphadenopathy. Reproductive: Hysterectomy.  There is no adnexal mass. Other: No intraperitoneal free fluid. Musculoskeletal: Bones are diffusely demineralized. Stable L5 compression deformity. IMPRESSION: 1. Dependent collapse/consolidation in the lung bases, left greater than right. Imaging features to likely related to atelectasis although pneumonia not entirely excluded. 2. Small to moderate hiatal hernia. 3. Cholelithiasis. 4. 7 mm nonobstructing stone lower pole left kidney. 5. Large stool volume in the rectum with distention up to 7 cm diameter. 6.  Aortic Atherosclerosis (ICD10-I70.0). Electronically Signed   By: Kennith Center M.D.   On: 07/19/2023 16:39   US Abdomen Limited RUQ (LIVER/GB)  Result Date: 07/19/2023 CLINICAL DATA:  [id number] Epigastric abdominal pain [id number] EXAM: ULTRASOUND ABDOMEN LIMITED RIGHT UPPER QUADRANT COMPARISON:  CT chest 07/09/2022 FINDINGS: Gallbladder: Gallbladder wall thickening up to 6.6 mm. Nonmobile cyst 9 mm gallstone in the gallbladder neck. No pericholecystic fluid. Sonographer describes sonographic Murphy's sign. Common bile duct: Diameter: 2.6 mm.  No intrahepatic biliary ductal dilatation. Liver: No focal lesion identified. Within normal limits in parenchymal echogenicity. Portal vein is patent on color Doppler imaging with normal direction of blood flow towards the liver. Other: Technologist describes technically difficult study secondary to body habitus. IMPRESSION: Cholelithiasis with gallbladder wall thickening and positive sonographic Murphy's sign, suggesting acute cholecystitis. Electronically Signed   By: Corlis Leak M.D.   On: 07/19/2023 16:31      Signature  -   Mliss Fritz  M.D on 07/23/2023 at  1:45 PM   -  To page go to [email or web address]

## 2023-07-23 NOTE — Plan of Care (Signed)
Patient less short of breath and using IS.

## 2023-07-23 NOTE — [place] FL2 (Signed)
[place] MEDICAID [id number] LEVEL OF CARE FORM     IDENTIFICATION  Patient Name: [name] Birthdate: Oct 28, 1946 Sex: female Admission Date (Current Location): 07/19/2023  [facility] and [place] Number:  Producer, television/film/video and Address:  [facility], [address] [place], [place] [address]      Provider Number: [id number]  Attending Physician Name and Address:  Elgergawy, Leana Roe, MD  Relative Name and Phone Number:       Current Level of Care: Hospital Recommended Level of Care: Skilled Nursing Facility Prior Approval Number:    Date Approved/Denied:   PASRR Number: [id number]  Discharge Plan: SNF    Current Diagnoses: Patient Active Problem List   Diagnosis Date Noted   Acute cholecystitis 07/21/2023   Severe sepsis (HCC) 07/21/2023   Acute respiratory failure with hypoxia (HCC) 07/19/2023   Urge incontinence 10/08/2022   Right ureteral stone 10/07/2022   Spasticity 01/30/2021   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   Dysconjugate gaze 04/03/2020   Benign paroxysmal positional vertigo due to bilateral vestibular disorder 12/23/2019   Vertigo    Neurogenic bowel    Neurogenic bladder    Multiple sclerosis exacerbation (HCC) 11/27/2019   Advance directive discussed with patient 05/17/2018   Paraplegia, unspecified (HCC) 07/02/2017   Recurrent UTI 07/02/2017   Cervical spinal stenosis 06/12/2016   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   Preventative health care 10/18/2012   Osteoporosis    Nephrolithiasis    Dyslipidemia 05/25/2008   Multiple sclerosis (HCC) 05/24/2008   MITRAL VALVE PROLAPSE, HX OF 05/24/2008    Orientation RESPIRATION BLADDER Height & Weight     Self, Time, Situation, Place  O2 (3L nasal cannula) Incontinent, External catheter Weight: 145 lb 1 oz (65.8 kg) Height:  5\' 3"  (160 cm)  BEHAVIORAL SYMPTOMS/MOOD NEUROLOGICAL BOWEL NUTRITION STATUS      Incontinent Diet (See dc summary)  AMBULATORY STATUS  COMMUNICATION OF NEEDS Skin   Extensive Assist Verbally Surgical wounds (Closed incision on abdomen)                       Personal Care Assistance Level of Assistance  Bathing, Feeding, Dressing Bathing Assistance: Maximum assistance Feeding assistance: Independent Dressing Assistance: Limited assistance     Functional Limitations Info             SPECIAL CARE FACTORS FREQUENCY                       Contractures Contractures Info: Not present    Additional Factors Info  Code Status, Allergies Code Status Info: Full Allergies Info: Baclofen, Iodinated Contrast Media, Procaine Hcl           Current Medications (07/23/2023):  This is the current hospital active medication list Current Facility-Administered Medications  Medication Dose Route Frequency Provider Last Rate Last Admin   acetaminophen (TYLENOL) tablet 650 mg  650 mg Oral Q6H PRN Stechschulte, Hyman Hopes, MD   650 mg at 07/19/23 1839   Or   acetaminophen (TYLENOL) suppository 650 mg  650 mg Rectal Q6H PRN Stechschulte, Hyman Hopes, MD       cefTRIAXone (ROCEPHIN) 2 g in sodium chloride 0.9 % 100 mL IVPB  2 g Intravenous Q24H Stechschulte, Hyman Hopes, MD 200 mL/hr at 07/22/23 1439 2 g at 07/22/23 1439   docusate sodium (COLACE) capsule 100 mg  100 mg Oral Daily PRN Elgergawy, Leana Roe, MD  feeding supplement (ENSURE ENLIVE / ENSURE PLUS) liquid 237 mL  237 mL Oral BID BM Leroy Sea, MD   237 mL at 07/23/23 0947   heparin injection 5,000 Units  5,000 Units Subcutaneous Q8H Stechschulte, Hyman Hopes, MD   5,000 Units at 07/23/23 8657   HYDROmorphone (DILAUDID) injection 0.5 mg  0.5 mg Intravenous Q3H PRN Stechschulte, Hyman Hopes, MD       methocarbamol (ROBAXIN) 500 mg in dextrose 5 % 50 mL IVPB  500 mg Intravenous Q6H PRN Stechschulte, Hyman Hopes, MD       metroNIDAZOLE (FLAGYL) tablet 500 mg  500 mg Oral Q12H Stechschulte, Hyman Hopes, MD   500 mg at 07/23/23 0948   ondansetron (ZOFRAN) injection 4 mg  4 mg Intravenous Q6H  PRN Stechschulte, Hyman Hopes, MD       oxyCODONE (Oxy IR/ROXICODONE) immediate release tablet 5 mg  5 mg Oral Q4H PRN Leroy Sea, MD       pantoprazole (PROTONIX) EC tablet 40 mg  40 mg Oral Q1200 Leroy Sea, MD   40 mg at 07/22/23 0819   polyethylene glycol (MIRALAX / GLYCOLAX) packet 17 g  17 g Oral Daily PRN Stechschulte, Hyman Hopes, MD       potassium chloride SA (KLOR-CON M) CR tablet 40 mEq  40 mEq Oral Q6H Elgergawy, Leana Roe, MD   40 mEq at 07/23/23 0948   prochlorperazine (COMPAZINE) injection 10 mg  10 mg Intravenous Q6H PRN Stechschulte, Hyman Hopes, MD       sodium chloride flush (NS) 0.9 % injection 3 mL  3 mL Intravenous Q12H Stechschulte, Hyman Hopes, MD   3 mL at 07/23/23 0947   sodium phosphate (FLEET) 7-19 GM/118ML enema 1 enema  1 enema Rectal Daily PRN Stechschulte, Hyman Hopes, MD         Discharge Medications: Please see discharge summary for a list of discharge medications.  Relevant Imaging Results:  Relevant Lab Results:   Additional Information SS# [id number]  Mearl Latin, LCSW

## 2023-07-24 ENCOUNTER — Inpatient Hospital Stay (HOSPITAL_COMMUNITY): Payer: Medicare Other

## 2023-07-24 DIAGNOSIS — K810 Acute cholecystitis: Secondary | ICD-10-CM | POA: Diagnosis not present

## 2023-07-24 DIAGNOSIS — J9601 Acute respiratory failure with hypoxia: Secondary | ICD-10-CM | POA: Diagnosis not present

## 2023-07-24 LAB — GLUCOSE, CAPILLARY: Glucose-Capillary: 141 mg/dL — ABNORMAL HIGH (ref 70–99)

## 2023-07-24 LAB — PHOSPHORUS: Phosphorus: 1.5 mg/dL — ABNORMAL LOW (ref 2.5–4.6)

## 2023-07-24 NOTE — Plan of Care (Signed)
Patient states she feels better today

## 2023-07-24 NOTE — Progress Notes (Signed)
PROGRESS NOTE                                                                                                                                                                                                             Patient Demographics:    [name], is a 77 y.o. female, DOB - 1946-07-17, UJW:[id number]  Outpatient Primary MD for the patient is Karie Schwalbe, MD    LOS - 4  Admit date - 07/19/2023    Chief Complaint  Patient presents with   Nausea   Emesis   Back Pain   Chest Pain       Brief Narrative (HPI from H&P)      77 y.o. female with medical history significant of MS, paraplegia, neurogenic bladder/bowel, spasticity, hyperlipidemia, BPPV, cervical spinal stenosis presenting with multiple complaints including nausea, vomiting, chest pain, back pain.  Consistent with sepsis due to acute cholecystitis she was admitted to the hospital, seen by general surgery. status post laparoscopic cholecystectomy.   Subjective:    [name] today denies any chest pain, still requiring oxygen 2 L nasal cannula, diarrhea has improved, nausea has improved after stopping Flagyl as well,.   Assessment  & Plan :    Sepsis present on admission due to acute cholecystitis, and Proteus bacteremia.   -General Surgery input greatly appreciated, status post laparoscopic cholecystectomy. -Blood cultures and intraoperative culture growing Proteus, continue with IV antibiotics, continue with IV Rocephin -IV Flagyl discontinued given significant nausea -procalcitonin and white blood cells trending down which is reassuring  Acute hypoxic respiratory failure at the time of admission.  -  Currently chest pain-free stable on 3 L nasal cannula oxygen, she did drop to 86% on room air upon stopping her oxygen, she is back on 2 L oxygen. -She was encouraged again to use incentive spirometer and flutter valve  -History entry in the  bases, right more than left, given recent gallbladder infection, will obtain CT chest to evaluate for drainable pleural effusion  Hypokalemia -Repleted  Elevated D-dimers  - likely is due to cholecystitis however prudent to rule out PE, allergic to IV dye hence - VQ scan stat on 07/20/2023 along with lower extremity venous duplex and echocardiogram.  HX of MS, cervical stenosis, chronic functional paraplegia, neurogenic bladder, chronic generalized weakness worse in bilateral lower extremities along with  chronic lower extremity contractures due to multiple different musculoskeletal issues according to the patient, largely bed and wheelchair-bound for the last 15 years.  Supportive care.  Hypokalemia.  Replaced       Condition - Extremely Guarded  Family Communication  :  none at bedside  Code Status :  Full  Consults  :  CCS, PCCM  PUD Prophylaxis : PPI   Procedures  :     VQ.  Low probability  TTE - 1. Left ventricular ejection fraction, by estimation, is 50 to 55%. The left ventricle has low normal function. The left ventricle has no regional wall motion abnormalities. Left ventricular diastolic parameters are consistent with Grade II diastolic dysfunction (pseudonormalization).  2. Right ventricular systolic function is normal. The right ventricular size is normal.  3. The mitral valve was not well visualized. No evidence of mitral valve regurgitation.  4. The aortic valve is tricuspid. Aortic valve regurgitation is not visualized.  5. The inferior vena cava is normal in size with greater than 50% respiratory variability, suggesting right atrial pressure of 3 mmHg  Leg [place] no DVT  CT scan abdomen pelvis.  1. Dependent collapse/consolidation in the lung bases, left greater than right. Imaging features to likely related to atelectasis although pneumonia not entirely excluded. 2. Small to moderate hiatal hernia. 3. Cholelithiasis. 4. 7 mm nonobstructing stone lower pole left kidney. 5.  Large stool volume in the rectum with distention up to 7 cm diameter. 6.  Aortic Atherosclerosis (ICD10-I70.0).  RUQ [place] -positive Murphy sign      Disposition Plan  :      DVT Prophylaxis  :  Heparin   Lab Results  Component Value Date   PLT 223 07/24/2023    Diet :  Diet Order             Diet regular Fluid consistency: Thin  Diet effective now                    Inpatient Medications  Scheduled Meds:  feeding supplement  237 mL Oral BID BM   heparin injection (subcutaneous)  5,000 Units Subcutaneous Q8H   pantoprazole  40 mg Oral Q1200   sodium chloride flush  3 mL Intravenous Q12H   Continuous Infusions:  cefTRIAXone (ROCEPHIN)  IV 200 mL/hr at 07/24/23 0600   methocarbamol (ROBAXIN) IV     PRN Meds:.acetaminophen **OR** acetaminophen, docusate sodium, HYDROmorphone (DILAUDID) injection, methocarbamol (ROBAXIN) IV, ondansetron (ZOFRAN) IV, oxyCODONE, polyethylene glycol, prochlorperazine, sodium phosphate  Antibiotics  :    Anti-infectives (From admission, onward)    Start     Dose/Rate Route Frequency Ordered Stop   07/20/23 2200  metroNIDAZOLE (FLAGYL) tablet 500 mg  Status:  Discontinued        500 mg Oral Every 12 hours 07/20/23 1153 07/23/23 1345   07/20/23 1600  cefTRIAXone (ROCEPHIN) 2 g in sodium chloride 0.9 % 100 mL IVPB        2 g 200 mL/hr over 30 Minutes Intravenous Every 24 hours 07/20/23 1153     07/20/23 0200  piperacillin-tazobactam (ZOSYN) IVPB 3.375 g  Status:  Discontinued       Placed in "Followed by" Linked Group   3.375 g 12.5 mL/hr over 240 Minutes Intravenous Every 8 hours 07/19/23 1834 07/20/23 1153   07/19/23 1845  piperacillin-tazobactam (ZOSYN) IVPB 3.375 g       Placed in "Followed by" Linked Group   3.375 g 100 mL/hr over 30 Minutes Intravenous  Once 07/19/23 1834 07/19/23 1954   07/19/23 1715  piperacillin-tazobactam (ZOSYN) IVPB 3.375 g  Status:  Discontinued        3.375 g 100 mL/hr over 30 Minutes Intravenous  Once  07/19/23 1701 07/19/23 1834         Objective:   Vitals:   07/23/23 1600 07/24/23 0000 07/24/23 0400 07/24/23 0800  BP:  128/73 118/72 118/62  Pulse:  69 75 85  Resp:  18 17 16   Temp: 97.9 F (36.6 C) 98.1 F (36.7 C) 97.8 F (36.6 C) 97.9 F (36.6 C)  TempSrc: Oral Oral Oral Oral  SpO2:  94% 95% 93%  Weight:      Height:        Wt Readings from Last 3 Encounters:  07/23/23 65.8 kg  10/07/22 59.9 kg  07/09/22 54.4 kg     Intake/Output Summary (Last 24 hours) at 07/24/2023 1227 Last data filed at 07/24/2023 1000 Gross per 24 hour  Intake 883 ml  Output 500 ml  Net 383 ml      Physical Exam  Awake Alert, Oriented X 3, chronic lower extremity weakness, bedbound/wheelchair dependent Symmetrical Chest wall movement, diminished air entry at the bases RRR,No Gallops,Rubs or new Murmurs, No Parasternal Heave +ve B.Sounds, Abd Soft, No tenderness, No rebound - guarding or rigidity. No Cyanosis, Clubbing or edema, No new Rash or bruise       Data Review:    Recent Labs  Lab 07/19/23 1204 07/20/23 0425 07/21/23 0558 07/22/23 0106 07/23/23 0014 07/24/23 0221  WBC 23.1* 39.4* 22.9* 26.9* 19.2* 12.5*  HGB 14.3 12.2 12.0 11.6* 12.0 11.9*  HCT 44.3 37.2 36.5 34.7* 36.8 35.9*  PLT 412* 271 220 214 228 223  MCV 93.5 90.7 91.3 93.0 90.2 90.7  MCH 30.2 29.8 30.0 31.1 29.4 30.1  MCHC 32.3 32.8 32.9 33.4 32.6 33.1  RDW 12.4 12.7 12.9 12.7 12.5 12.6  LYMPHSABS 0.4*  --  1.0 1.3 3.2 4.1*  MONOABS 0.1  --  0.6 1.3* 1.2* 1.1*  EOSABS 0.0  --  0.0 0.0 0.2 0.3  BASOSABS 0.0  --  0.0 0.0 0.0 0.0    Recent Labs  Lab 07/19/23 1342 07/20/23 0425 07/20/23 0602 07/20/23 0657 07/21/23 0553 07/21/23 0558 07/22/23 0106 07/23/23 0014 07/24/23 0221  NA  --  137  --   --   --  139 138 142 138  K  --  3.4*  --   --   --  3.8 3.5 3.3* 4.1  CL  --  107  --   --   --  111 110 109 106  CO2  --  20*  --   --   --  17* 21* 25 25  ANIONGAP  --  10  --   --   --  11 7 8 7    GLUCOSE  --  108*  --   --   --  112* 170* 95 94  BUN  --  18  --   --   --  13 19 10  6*  CREATININE  --  0.88  --   --   --  0.76 0.79 0.57 0.50  AST  --  36  --   --   --  33 21 17 26   ALT  --  26  --   --   --  23 19 19 21   ALKPHOS  --  91  --   --   --  82  89 82 91  BILITOT  --  1.1  --   --   --  0.7 0.4 0.4 0.6  ALBUMIN  --  2.5*  --   --   --  2.3* 2.1* 2.3* 2.4*  CRP  --   --   --  17.7*  --  18.9* 9.1* 3.4* 2.1*  DDIMER >20.00*  --   --   --   --   --   --   --   --   PROCALCITON  --   --  57.93  --   --  35.11 29.39 16.80 7.25  LATICACIDVEN  --   --  2.5*  --   --   --   --   --   --   TSH  --  0.959  --   --  1.028  --   --   --   --   BNP  --   --   --   --   --  1,098.9* 517.6* 451.7* 188.2*  MG  --   --   --   --   --  1.6* 2.6* 1.9 1.8  CALCIUM  --  8.0*  --   --   --  7.7* 8.1* 8.2* 8.3*      Recent Labs  Lab 07/19/23 1342 07/20/23 0425 07/20/23 0602 07/20/23 0657 07/21/23 0553 07/21/23 0558 07/22/23 0106 07/23/23 0014 07/24/23 0221  CRP  --   --   --  17.7*  --  18.9* 9.1* 3.4* 2.1*  DDIMER >20.00*  --   --   --   --   --   --   --   --   PROCALCITON  --   --  57.93  --   --  35.11 29.39 16.80 7.25  LATICACIDVEN  --   --  2.5*  --   --   --   --   --   --   TSH  --  0.959  --   --  1.028  --   --   --   --   BNP  --   --   --   --   --  1,098.9* 517.6* 451.7* 188.2*  MG  --   --   --   --   --  1.6* 2.6* 1.9 1.8  CALCIUM  --  8.0*  --   --   --  7.7* 8.1* 8.2* 8.3*    No results for input(s): "TSH", "T4TOTAL", "T3FREE", "THYROIDAB" in the last 72 hours.  Invalid input(s): "FREET3"    Micro Results Recent Results (from the past 240 hour(s))  Resp panel by RT-PCR (RSV, Flu A&B, Covid) Anterior Nasal Swab     Status: None   Collection Time: 07/19/23  3:25 PM   Specimen: Anterior Nasal Swab  Result Value Ref Range Status   SARS Coronavirus 2 by RT PCR NEGATIVE NEGATIVE Final   Influenza A by PCR NEGATIVE NEGATIVE Final   Influenza B by PCR NEGATIVE  NEGATIVE Final    Comment: (NOTE) The Xpert Xpress SARS-CoV-2/FLU/RSV plus assay is intended as an aid in the diagnosis of influenza from Nasopharyngeal swab specimens and should not be used as a sole basis for treatment. Nasal washings and aspirates are unacceptable for Xpert Xpress SARS-CoV-2/FLU/RSV testing.  Fact Sheet for Patients: BloggerCourse.com  Fact Sheet for Healthcare Providers: SeriousBroker.it  This test is not yet approved or cleared by the [place] FDA and has  been authorized for detection and/or diagnosis of SARS-CoV-2 by FDA under an Emergency Use Authorization (EUA). This EUA will remain in effect (meaning this test can be used) for the duration of the COVID-19 declaration under Section 564(b)(1) of the Act, 21 U.S.C. section 360bbb-3(b)(1), unless the authorization is terminated or revoked.     Resp Syncytial Virus by PCR NEGATIVE NEGATIVE Final    Comment: (NOTE) Fact Sheet for Patients: BloggerCourse.com  Fact Sheet for Healthcare Providers: SeriousBroker.it  This test is not yet approved or cleared by the [place] FDA and has been authorized for detection and/or diagnosis of SARS-CoV-2 by FDA under an Emergency Use Authorization (EUA). This EUA will remain in effect (meaning this test can be used) for the duration of the COVID-19 declaration under Section 564(b)(1) of the Act, 21 U.S.C. section 360bbb-3(b)(1), unless the authorization is terminated or revoked.  Performed at [facility], [address], [place], [place] [address]   Blood culture (routine x 2)     Status: Abnormal   Collection Time: 07/19/23  6:10 PM   Specimen: BLOOD  Result Value Ref Range Status   Specimen Description BLOOD BLOOD RIGHT ARM  Final   Special Requests   Final    BOTTLES DRAWN AEROBIC AND ANAEROBIC Blood Culture results may not be optimal due to an  inadequate volume of blood received in culture bottles   Culture  Setup Time   Final    GRAM NEGATIVE RODS IN BOTH AEROBIC AND ANAEROBIC BOTTLES CRITICAL RESULT CALLED TO, READ BACK BY AND VERIFIED WITH: E. MARTIN PHARMD, AT 1123 07/20/23 D. VANHOOK    Culture (A)  Final    PROTEUS MIRABILIS SUSCEPTIBILITIES PERFORMED ON PREVIOUS CULTURE WITHIN THE LAST 5 DAYS. Performed at [facility], [address] [place], [place] [address]    Report Status 07/22/2023 FINAL  Final  Blood culture (routine x 2)     Status: Abnormal   Collection Time: 07/19/23  6:10 PM   Specimen: BLOOD  Result Value Ref Range Status   Specimen Description BLOOD BLOOD RIGHT ARM  Final   Special Requests   Final    BOTTLES DRAWN AEROBIC AND ANAEROBIC Blood Culture results may not be optimal due to an inadequate volume of blood received in culture bottles   Culture  Setup Time   Final    GRAM NEGATIVE RODS IN BOTH AEROBIC AND ANAEROBIC BOTTLES Organism ID to follow CRITICAL RESULT CALLED TO, READ BACK BY AND VERIFIED WITHAntoine Primas PHARMD, AT 1114 07/20/23 Renato Shin Performed at [facility], [address] [place], [place] [address]    Culture PROTEUS MIRABILIS (A)  Final   Report Status 07/22/2023 FINAL  Final   Organism ID, Bacteria PROTEUS MIRABILIS  Final      Susceptibility   Proteus mirabilis - MIC*    AMPICILLIN <=2 SENSITIVE Sensitive     CEFEPIME <=0.12 SENSITIVE Sensitive     CEFTAZIDIME <=1 SENSITIVE Sensitive     CEFTRIAXONE <=0.25 SENSITIVE Sensitive     CIPROFLOXACIN <=0.25 SENSITIVE Sensitive     GENTAMICIN <=1 SENSITIVE Sensitive     IMIPENEM 2 SENSITIVE Sensitive     TRIMETH/SULFA <=20 SENSITIVE Sensitive     AMPICILLIN/SULBACTAM <=2 SENSITIVE Sensitive     PIP/TAZO <=4 SENSITIVE Sensitive     * PROTEUS MIRABILIS  Blood Culture ID Panel (Reflexed)     Status: Abnormal   Collection Time: 07/19/23  6:10 PM  Result Value Ref Range Status  Enterococcus faecalis NOT DETECTED NOT  DETECTED Final   Enterococcus Faecium NOT DETECTED NOT DETECTED Final   Listeria monocytogenes NOT DETECTED NOT DETECTED Final   Staphylococcus species NOT DETECTED NOT DETECTED Final   Staphylococcus aureus (BCID) NOT DETECTED NOT DETECTED Final   Staphylococcus epidermidis NOT DETECTED NOT DETECTED Final   Staphylococcus lugdunensis NOT DETECTED NOT DETECTED Final   Streptococcus species NOT DETECTED NOT DETECTED Final   Streptococcus agalactiae NOT DETECTED NOT DETECTED Final   Streptococcus pneumoniae NOT DETECTED NOT DETECTED Final   Streptococcus pyogenes NOT DETECTED NOT DETECTED Final   A.calcoaceticus-baumannii NOT DETECTED NOT DETECTED Final   Bacteroides fragilis NOT DETECTED NOT DETECTED Final   Enterobacterales DETECTED (A) NOT DETECTED Final    Comment: Enterobacterales represent a large order of gram negative bacteria, not a single organism. CRITICAL RESULT CALLED TO, READ BACK BY AND VERIFIED WITH: E. MARTIN PHARMD, AT 1114 07/20/23 D. VANHOOK    Enterobacter cloacae complex NOT DETECTED NOT DETECTED Final   Escherichia coli NOT DETECTED NOT DETECTED Final   Klebsiella aerogenes NOT DETECTED NOT DETECTED Final   Klebsiella oxytoca NOT DETECTED NOT DETECTED Final   Klebsiella pneumoniae NOT DETECTED NOT DETECTED Final   Proteus species DETECTED (A) NOT DETECTED Final    Comment: CRITICAL RESULT CALLED TO, READ BACK BY AND VERIFIED WITH: E. MARTIN PHARMD, AT 1114 07/20/23 D. VANHOOK    Salmonella species NOT DETECTED NOT DETECTED Final   Serratia marcescens NOT DETECTED NOT DETECTED Final   Haemophilus influenzae NOT DETECTED NOT DETECTED Final   Neisseria meningitidis NOT DETECTED NOT DETECTED Final   Pseudomonas aeruginosa NOT DETECTED NOT DETECTED Final   Stenotrophomonas maltophilia NOT DETECTED NOT DETECTED Final   Candida albicans NOT DETECTED NOT DETECTED Final   Candida auris NOT DETECTED NOT DETECTED Final   Candida glabrata NOT DETECTED NOT DETECTED Final    Candida krusei NOT DETECTED NOT DETECTED Final   Candida parapsilosis NOT DETECTED NOT DETECTED Final   Candida tropicalis NOT DETECTED NOT DETECTED Final   Cryptococcus neoformans/gattii NOT DETECTED NOT DETECTED Final   CTX-M ESBL NOT DETECTED NOT DETECTED Final   Carbapenem resistance IMP NOT DETECTED NOT DETECTED Final   Carbapenem resistance KPC NOT DETECTED NOT DETECTED Final   Carbapenem resistance NDM NOT DETECTED NOT DETECTED Final   Carbapenem resist OXA 48 LIKE NOT DETECTED NOT DETECTED Final   Carbapenem resistance VIM NOT DETECTED NOT DETECTED Final    Comment: Performed at [facility], [address], [place], [place] [address]  MRSA Next Gen by PCR, Nasal     Status: None   Collection Time: 07/19/23  9:05 PM   Specimen: Nasal Mucosa; Nasal Swab  Result Value Ref Range Status   MRSA by PCR Next Gen NOT DETECTED NOT DETECTED Final    Comment: (NOTE) The GeneXpert MRSA Assay (FDA approved for NASAL specimens only), is one component of a comprehensive MRSA colonization surveillance program. It is not intended to diagnose MRSA infection nor to guide or monitor treatment for MRSA infections. Test performance is not FDA approved in patients less than 23 years old. Performed at [facility], [address], [place], [place] [address]     Radiology Reports DG Chest [place] 1 View  Result Date: 07/21/2023 CLINICAL DATA:  77 year old female with history of shortness of breath. Respiratory failure. EXAM: PORTABLE CHEST 1 VIEW COMPARISON:  Chest x-ray 07/20/2023. FINDINGS: Lung volumes are low. Moderate left pleural effusion, new compared to the prior  examination. Opacity at the left base which may reflect atelectasis and/or consolidation. Widespread interstitial prominence and peribronchial cuffing noted elsewhere throughout the lungs bilaterally. No definite right pleural effusion. No pneumothorax. The patient is rotated to the left on today's exam, resulting in severe  distortion of cardiomediastinal contours and reduced diagnostic sensitivity and specificity for mediastinal pathology. Atherosclerotic calcifications in the thoracic aorta. Status post right shoulder arthroplasty. Multiple old healed bilateral rib fractures are incidentally noted. IMPRESSION: 1. New area of atelectasis and/or consolidation in the left lung base with superimposed moderate left pleural effusion. 2. Diffuse interstitial prominence and peribronchial cuffing, concerning for a background of bronchitis. 3. Aortic atherosclerosis. Electronically Signed   By: Trudie Reed M.D.   On: 07/21/2023 07:51   ECHOCARDIOGRAM COMPLETE  Result Date: 07/20/2023    ECHOCARDIOGRAM REPORT   Patient Name:   [name] Date of Exam: 07/20/2023 Medical Rec #:  [id number]        Height:       63.0 in Accession #:    [id number]       Weight:       139.1 lb Date of Birth:  [birth year]        BSA:          1.657 m Patient Age:    77 years         BP:           95/65 mmHg Patient Gender: F                HR:           65 bpm. Exam Location:  Inpatient Procedure: 2D Echo, Color Doppler and Cardiac Doppler Indications:    CHF- Acute Diastolic  History:        Patient has no prior history of Echocardiogram examinations.                 Mitral Valve Prolapse.  Sonographer:    Raeford Razor Referring Phys: [facility]  Sonographer Comments: Image acquisition challenging due to patient body habitus. IMPRESSIONS  1. Left ventricular ejection fraction, by estimation, is 50 to 55%. The left ventricle has low normal function. The left ventricle has no regional wall motion abnormalities. Left ventricular diastolic parameters are consistent with Grade II diastolic dysfunction (pseudonormalization).  2. Right ventricular systolic function is normal. The right ventricular size is normal.  3. The mitral valve was not well visualized. No evidence of mitral valve regurgitation.  4. The aortic valve is tricuspid. Aortic valve  regurgitation is not visualized.  5. The inferior vena cava is normal in size with greater than 50% respiratory variability, suggesting right atrial pressure of 3 mmHg. FINDINGS  Left Ventricle: Left ventricular ejection fraction, by estimation, is 50 to 55%. The left ventricle has low normal function. The left ventricle has no regional wall motion abnormalities. The left ventricular internal cavity size was normal in size. There is no left ventricular hypertrophy. Left ventricular diastolic parameters are consistent with Grade II diastolic dysfunction (pseudonormalization). Right Ventricle: The right ventricular size is normal. Right ventricular systolic function is normal. Left Atrium: Left atrial size was normal in size. Right Atrium: Right atrial size was normal in size. Pericardium: There is no evidence of pericardial effusion. Mitral Valve: The mitral valve was not well visualized. No evidence of mitral valve regurgitation. Tricuspid Valve: The tricuspid valve is normal in structure. Tricuspid valve regurgitation is mild. Aortic Valve: The aortic valve is tricuspid. Aortic valve regurgitation  is not visualized. Aortic valve mean gradient measures 3.0 mmHg. Aortic valve peak gradient measures 5.7 mmHg. Aortic valve area, by VTI measures 3.67 cm. Pulmonic Valve: The pulmonic valve was not well visualized. Pulmonic valve regurgitation is not visualized. Aorta: The aortic root and ascending aorta are structurally normal, with no evidence of dilitation. Venous: The inferior vena cava is normal in size with greater than 50% respiratory variability, suggesting right atrial pressure of 3 mmHg. IAS/Shunts: No atrial level shunt detected by color flow Doppler.  LEFT VENTRICLE PLAX 2D LVIDd:         3.90 cm     Diastology LVIDs:         2.90 cm     LV e' medial:    7.83 cm/s LV PW:         0.90 cm     LV E/e' medial:  16.1 LV IVS:        0.90 cm     LV e' lateral:   10.80 cm/s LVOT diam:     2.20 cm     LV E/e' lateral:  11.7 LV SV:         94 LV SV Index:   56 LVOT Area:     3.80 cm  LV Volumes (MOD) LV vol d, MOD A2C: 49.2 ml LV vol d, MOD A4C: 53.1 ml LV vol s, MOD A2C: 24.4 ml LV vol s, MOD A4C: 26.4 ml LV SV MOD A2C:     24.8 ml LV SV MOD A4C:     53.1 ml LV SV MOD BP:      26.4 ml RIGHT VENTRICLE             IVC RV Basal diam:  2.40 cm     IVC diam: 2.00 cm RV S prime:     12.10 cm/s TAPSE (M-mode): 2.4 cm LEFT ATRIUM           Index        RIGHT ATRIUM          Index LA diam:      2.90 cm 1.75 cm/m   RA Area:     7.92 cm LA Vol (A2C): 16.8 ml 10.14 ml/m  RA Volume:   11.20 ml 6.76 ml/m LA Vol (A4C): 16.1 ml 9.72 ml/m  AORTIC VALVE AV Area (Vmax):    3.23 cm AV Area (Vmean):   3.39 cm AV Area (VTI):     3.67 cm AV Vmax:           119.00 cm/s AV Vmean:          81.400 cm/s AV VTI:            0.255 m AV Peak Grad:      5.7 mmHg AV Mean Grad:      3.0 mmHg LVOT Vmax:         101.00 cm/s LVOT Vmean:        72.500 cm/s LVOT VTI:          0.246 m LVOT/AV VTI ratio: 0.96  AORTA Ao Asc diam: 3.00 cm MITRAL VALVE                TRICUSPID VALVE MV Area (PHT): 4.63 cm     TR Peak grad:   26.0 mmHg MV Decel Time: 164 msec     TR Vmax:        255.00 cm/s MV E velocity: 126.00 cm/s MV A velocity: 88.80 cm/s  SHUNTS MV E/A ratio:  1.42         Systemic VTI:  0.25 m                             Systemic Diam: 2.20 cm Carolan Clines Electronically signed by Carolan Clines Signature Date/Time: 07/20/2023/1:00:16 PM    Final       Signature  -   Huey Bienenstock M.D on 07/24/2023 at 12:27 PM   -  To page go to [email or web address]

## 2023-07-24 NOTE — Plan of Care (Signed)
  Problem: Education: Goal: Knowledge of General Education information will improve Description: Including pain rating scale, medication(s)/side effects and non-pharmacologic comfort measures Outcome: Progressing   Problem: Health Behavior/Discharge Planning: Goal: Ability to manage health-related needs will improve Outcome: Progressing   Problem: Clinical Measurements: Goal: Ability to maintain clinical measurements within normal limits will improve Outcome: Progressing Goal: Will remain free from infection Outcome: Progressing Goal: Respiratory complications will improve Outcome: Progressing Goal: Cardiovascular complication will be avoided Outcome: Progressing   Problem: Elimination: Goal: Will not experience complications related to urinary retention Outcome: Progressing   Problem: Pain Managment: Goal: General experience of comfort will improve Outcome: Progressing   Problem: Safety: Goal: Ability to remain free from injury will improve Outcome: Progressing   Problem: Skin Integrity: Goal: Risk for impaired skin integrity will decrease Outcome: Progressing   Problem: Activity: Goal: Risk for activity intolerance will decrease Outcome: Not Progressing   Problem: Nutrition: Goal: Adequate nutrition will be maintained Outcome: Not Progressing   Problem: Coping: Goal: Level of anxiety will decrease Outcome: Not Progressing   Problem: Elimination: Goal: Will not experience complications related to bowel motility Outcome: Not Progressing

## 2023-07-24 NOTE — Progress Notes (Signed)
Patient to ct via bed.

## 2023-07-24 NOTE — TOC Progression Note (Signed)
Transition of Care [facility]) - Progression Note    Patient Details  Name: [name] MRN: [id number] Date of Birth: 1946-07-08  [facility]) CM/SW Contact  Mearl Latin, LCSW Phone Number: 07/24/2023, 3:12 PM  Clinical Narrative:    CSW met with patient regarding likely transition back to [facility] this weekend. She asked CSW about how to apply for Medicaid once her money "runs out". CSW advised her to work with staff at [employer] and CSW provided her with printed Medicaid info. She requested [facility] for transport.   CSW spoke with [facility] and was given the following info for weekend contact for discharge:  Call Debroah Baller [phone]) when DC Summary is ready. Patient resides in: [facility] [phone], bed B.  Report RN #: [phone]   Expected Discharge Plan: Skilled Nursing Facility Barriers to Discharge: Continued Medical Work up  Expected Discharge Plan and Services In-house Referral: Clinical Social Work   Post Acute Care Choice: Skilled Nursing Facility Living arrangements for the past 2 months: Skilled Nursing Facility                                       Social Determinants of Health (SDOH) Interventions SDOH Screenings   Food Insecurity: No Food Insecurity (07/20/2023)  Housing: Low Risk  (07/20/2023)  Transportation Needs: No Transportation Needs (07/20/2023)  Utilities: Not At Risk (07/20/2023)  Depression (PHQ2-9): Low Risk  (02/10/2023)  Financial Resource Strain: Low Risk  (02/17/2023)  Stress: No Stress Concern Present (02/17/2023)  Tobacco Use: Low Risk  (07/20/2023)    Readmission Risk Interventions     No data to display

## 2023-07-25 DIAGNOSIS — J9601 Acute respiratory failure with hypoxia: Secondary | ICD-10-CM | POA: Diagnosis not present

## 2023-07-25 DIAGNOSIS — K810 Acute cholecystitis: Secondary | ICD-10-CM | POA: Diagnosis not present

## 2023-07-25 MED ORDER — CEFPODOXIME PROXETIL 200 MG PO TABS: 200.0000 mg | ORAL_TABLET | Freq: Two times a day (BID) | ORAL | Status: DC

## 2023-07-25 MED ORDER — CEFPODOXIME PROXETIL 200 MG PO TABS: 200.0000 mg | ORAL_TABLET | Freq: Two times a day (BID) | ORAL | Status: AC

## 2023-07-25 MED ORDER — ACETAMINOPHEN 325 MG PO TABS: 650.0000 mg | ORAL_TABLET | Freq: Four times a day (QID) | ORAL | Status: AC | PRN

## 2023-07-25 MED ORDER — POLYETHYLENE GLYCOL 3350 17 G PO PACK: 17.0000 g | PACK | Freq: Every day | ORAL | Status: AC | PRN

## 2023-07-25 MED ORDER — ENSURE ENLIVE PO LIQD: 237.0000 mL | Freq: Two times a day (BID) | ORAL | Status: AC

## 2023-07-25 MED ORDER — METHENAMINE HIPPURATE 1 G PO TABS: 1.0000 g | ORAL_TABLET | ORAL | Status: DC

## 2023-07-25 NOTE — Plan of Care (Signed)

## 2023-07-25 NOTE — Discharge Summary (Signed)
Physician Discharge Summary  [name]:[id number] DOB: [birth year] DOA: 07/19/2023  PCP: Kimberly Schwalbe, MD  Admit date: 07/19/2023 Discharge date: 07/25/2023  Admitted From: (SNF, [facility], long-term resident,) Disposition: Same  Recommendations for Outpatient Follow-up:  She is on 2 L oxygen, please encourage use incentive spirometry, wean oxygen as tolerated Continue with p.o. antibiotic for another 4 days Please check CBC, BCMP in 3 to 5 days Patient to keep her follow-up appointment with general surgery on 8/27    Diet recommendation: Regular diet  Brief/Interim Summary:  77 y.o. female with medical history significant of MS, paraplegia, neurogenic bladder/bowel, spasticity, hyperlipidemia, BPPV, cervical spinal stenosis presenting with multiple complaints including nausea, vomiting, chest pain, back pain.  Consistent with sepsis due to acute cholecystitis she was admitted to the hospital, seen by general surgery. status post laparoscopic cholecystectomy.    Sepsis present on admission due to acute cholecystitis, and Proteus bacteremia.   -General Surgery input greatly appreciated, status post laparoscopic cholecystectomy. -Blood cultures and intraoperative culture growing Proteus, pansensitive, she was treated with 6 days of IV Rocephin during hospital stay, she will be discharged on p.o. antibiotics for another 4 days for total of 10 days treatment  -Patient instructed to follow-up with general surgery on outpatient appointment 08/11/2023   Acute hypoxic respiratory failure at the time of admission.  -  Currently chest pain-free stable on 3 L nasal cannula oxygen, she did drop to 86% on room air upon stopping her oxygen, she is back on 2 L oxygen.  CT chest significant for mild bilateral pleural effusion, I think it is too small to drain, so for now she needs to be continued to encourage using her incentive spirometer and flutter valve and wean oxygen as tolerated.    Hypokalemia -Repleted   Elevated D-dimers  - likely is due to cholecystitis however prudent to rule out PE, allergic to IV dye hence - VQ scan stat on 07/20/2023 along with lower extremity venous duplex and echocardiogram.   HX of MS, cervical stenosis, chronic functional paraplegia, neurogenic bladder, chronic generalized weakness worse in bilateral lower extremities along with chronic lower extremity contractures due to multiple different musculoskeletal issues according to the patient, largely bed and wheelchair-bound for the last 15 years.  Supportive care.   Hypokalemia.  Replaced    Discharge Diagnoses:  Principal Problem:   Acute respiratory failure with hypoxia (HCC) Active Problems:   Dyslipidemia   Multiple sclerosis (HCC)   Paraplegia, unspecified (HCC)   Neurogenic bowel   Neurogenic bladder   Spasticity   Acute cholecystitis   Severe sepsis [facility])    Discharge Instructions  Discharge Instructions     Diet - low sodium heart healthy   Complete by: As directed    Discharge instructions   Complete by: As directed    Please  continue regular diet, will need to follow-up with neurosurgery as an outpatient on 8/27, please keep encouraging to use incentive spirometer, she is on 2 L oxygen, please encourage to use incentive spirometer and wean oxygen as tolerated   Increase activity slowly   Complete by: As directed       Allergies as of 07/25/2023       Reactions   Baclofen Diarrhea, Nausea And Vomiting   Iodinated Contrast Media Hives   Procaine Hcl    UNSPECIFIED        Medication List     TAKE these medications    acetaminophen 325 MG tablet Commonly known as:  TYLENOL Take 2 tablets (650 mg total) by mouth every 6 (six) hours as needed for mild pain (or Fever >/= 101).   cefpodoxime 200 MG tablet Commonly known as: VANTIN Take 1 tablet (200 mg total) by mouth every 12 (twelve) hours for 4 days. Start taking on: July 26, 2023   feeding  supplement Liqd Take 237 mLs by mouth 2 (two) times daily between meals.   methenamine 1 g tablet Commonly known as: HIPREX TAKE 1 TABLET BY MOUTH EVERY OTHER DAY   polyethylene glycol 17 g packet Commonly known as: MIRALAX / GLYCOLAX Take 17 g by mouth daily as needed for mild constipation.        Follow-up Information     Maczis, Kimberly Slade, PA-C Follow up on 08/11/2023.   Specialty: General Surgery Why: 9 am,Arrive 30 minutes prior to your appointment time, Please bring your insurance card and photo ID Contact information: [address] [facility] [place] [address] [phone]                Allergies  Allergen Reactions   Baclofen Diarrhea and Nausea And Vomiting   Iodinated Contrast Media Hives   Procaine Hcl     UNSPECIFIED    Consultations: Surgery   Procedures/Studies: CT CHEST WO CONTRAST  Result Date: 07/24/2023 CLINICAL DATA:  Hypoxia. Cholecystectomy on 07/20/2023 (postoperative day 4). EXAM: CT CHEST WITHOUT CONTRAST TECHNIQUE: Multidetector CT imaging of the chest was performed following the standard protocol without IV contrast. RADIATION DOSE REDUCTION: This exam was performed according to the departmental dose-optimization program which includes automated exposure control, adjustment of the mA and/or kV according to patient size and/or use of iterative reconstruction technique. COMPARISON:  07/19/2023 FINDINGS: Cardiovascular: Coronary, aortic arch, and branch vessel atherosclerotic vascular disease. Mediastinum/Nodes: Small type 1 hiatal hernia. Lungs/Pleura: Small to moderate bilateral pleural effusions with primarily passive atelectasis in the lower lobes. Upper Abdomen: Trace amount of free intraperitoneal gas in the upper abdomen, not considered unexpected/abnormal on postoperative day 4 for cholecystectomy. Musculoskeletal: Reverse right shoulder arthroplasty. Right lateral rib deformity compatible with old  healed fracture. Thoracic kyphosis. Age-indeterminate but likely remote inferior endplate compression at the T8 vertebral level. Possible central disc protrusion at T8-9. IMPRESSION: 1. Small to moderate bilateral pleural effusions with primarily passive atelectasis in the lower lobes. 2. Trace amount of free intraperitoneal gas in the upper abdomen, not considered unexpected/abnormal on postoperative day 4 for cholecystectomy. 3. Small type 1 hiatal hernia. 4. Age-indeterminate but likely remote inferior endplate compression at the T8 vertebral level. Possible central disc protrusion at T8-9. 5. Aortic and coronary atherosclerosis. Aortic Atherosclerosis (ICD10-I70.0). Electronically Signed   By: Gaylyn Rong M.D.   On: 07/24/2023 14:17   DG Chest Port 1 View  Result Date: 07/21/2023 CLINICAL DATA:  77 year old female with history of shortness of breath. Respiratory failure. EXAM: PORTABLE CHEST 1 VIEW COMPARISON:  Chest x-ray 07/20/2023. FINDINGS: Lung volumes are low. Moderate left pleural effusion, new compared to the prior examination. Opacity at the left base which may reflect atelectasis and/or consolidation. Widespread interstitial prominence and peribronchial cuffing noted elsewhere throughout the lungs bilaterally. No definite right pleural effusion. No pneumothorax. The patient is rotated to the left on today's exam, resulting in severe distortion of cardiomediastinal contours and reduced diagnostic sensitivity and specificity for mediastinal pathology. Atherosclerotic calcifications in the thoracic aorta. Status post right shoulder arthroplasty. Multiple old healed bilateral rib fractures are incidentally noted. IMPRESSION: 1. New area of atelectasis and/or  consolidation in the left lung base with superimposed moderate left pleural effusion. 2. Diffuse interstitial prominence and peribronchial cuffing, concerning for a background of bronchitis. 3. Aortic atherosclerosis. Electronically Signed    By: Trudie Reed M.D.   On: 07/21/2023 07:51   VAS [place] LOWER EXTREMITY VENOUS (DVT)  Result Date: 07/20/2023  Lower Venous DVT Study Patient Name:  JAEDYN LEIGHT  Date of Exam:   07/20/2023 Medical Rec #: [id number]         Accession #:    [id number] Date of Birth: 06/03/1946         Patient Gender: F Patient Age:   89 years Exam Location:  [facility] Procedure:      VAS [place] LOWER EXTREMITY VENOUS (DVT) Referring Phys: Lyn Hollingshead MELVIN --------------------------------------------------------------------------------  Other Indications: Positive D dimer. Risk Factors: Immobility None identified. Comparison Study: No prior study Performing Technologist: Shona Simpson  Examination Guidelines: A complete evaluation includes B-mode imaging, spectral Doppler, color Doppler, and power Doppler as needed of all accessible portions of each vessel. Bilateral testing is considered an integral part of a complete examination. Limited examinations for reoccurring indications may be performed as noted. The reflux portion of the exam is performed with the patient in reverse Trendelenburg.  +---------+---------------+---------+-----------+----------+--------------+ RIGHT    CompressibilityPhasicitySpontaneityPropertiesThrombus Aging +---------+---------------+---------+-----------+----------+--------------+ CFV      Full           Yes      Yes                                 +---------+---------------+---------+-----------+----------+--------------+ SFJ      Full                                                        +---------+---------------+---------+-----------+----------+--------------+ FV Prox  Full                                                        +---------+---------------+---------+-----------+----------+--------------+ FV Mid   Full                                                        +---------+---------------+---------+-----------+----------+--------------+ FV  DistalFull                                                        +---------+---------------+---------+-----------+----------+--------------+ PFV      Full                                                        +---------+---------------+---------+-----------+----------+--------------+ POP      Full  Yes      Yes                                 +---------+---------------+---------+-----------+----------+--------------+ PTV      Full                                                        +---------+---------------+---------+-----------+----------+--------------+ PERO     Full                                                        +---------+---------------+---------+-----------+----------+--------------+   +---------+---------------+---------+-----------+----------+--------------+ LEFT     CompressibilityPhasicitySpontaneityPropertiesThrombus Aging +---------+---------------+---------+-----------+----------+--------------+ CFV      Full           Yes      Yes                                 +---------+---------------+---------+-----------+----------+--------------+ SFJ      Full                                                        +---------+---------------+---------+-----------+----------+--------------+ FV Prox  Full                                                        +---------+---------------+---------+-----------+----------+--------------+ FV Mid   Full                                                        +---------+---------------+---------+-----------+----------+--------------+ FV DistalFull                                                        +---------+---------------+---------+-----------+----------+--------------+ PFV      Full                                                        +---------+---------------+---------+-----------+----------+--------------+ POP      Full           Yes      Yes                                  +---------+---------------+---------+-----------+----------+--------------+ PTV  Full                                                        +---------+---------------+---------+-----------+----------+--------------+ PERO     Full                                                        +---------+---------------+---------+-----------+----------+--------------+     Summary: BILATERAL: - No evidence of deep vein thrombosis seen in the lower extremities, bilaterally. -No evidence of popliteal cyst, bilaterally.   *See table(s) above for measurements and observations. Electronically signed by Gerarda Fraction on 07/20/2023 at 7:39:45 PM.    Final    ECHOCARDIOGRAM COMPLETE  Result Date: 07/20/2023    ECHOCARDIOGRAM REPORT   Patient Name:   ROENE MORY Date of Exam: 07/20/2023 Medical Rec #:  [id number]        Height:       63.0 in Accession #:    [id number]       Weight:       139.1 lb Date of Birth:  07/21/1946        BSA:          1.657 m Patient Age:    77 years         BP:           95/65 mmHg Patient Gender: F                HR:           65 bpm. Exam Location:  Inpatient Procedure: 2D Echo, Color Doppler and Cardiac Doppler Indications:    CHF- Acute Diastolic  History:        Patient has no prior history of Echocardiogram examinations.                 Mitral Valve Prolapse.  Sonographer:    Raeford Razor Referring Phys: Stanford Scotland [facility]  Sonographer Comments: Image acquisition challenging due to patient body habitus. IMPRESSIONS  1. Left ventricular ejection fraction, by estimation, is 50 to 55%. The left ventricle has low normal function. The left ventricle has no regional wall motion abnormalities. Left ventricular diastolic parameters are consistent with Grade II diastolic dysfunction (pseudonormalization).  2. Right ventricular systolic function is normal. The right ventricular size is normal.  3. The mitral valve was not well visualized. No evidence of mitral valve  regurgitation.  4. The aortic valve is tricuspid. Aortic valve regurgitation is not visualized.  5. The inferior vena cava is normal in size with greater than 50% respiratory variability, suggesting right atrial pressure of 3 mmHg. FINDINGS  Left Ventricle: Left ventricular ejection fraction, by estimation, is 50 to 55%. The left ventricle has low normal function. The left ventricle has no regional wall motion abnormalities. The left ventricular internal cavity size was normal in size. There is no left ventricular hypertrophy. Left ventricular diastolic parameters are consistent with Grade II diastolic dysfunction (pseudonormalization). Right Ventricle: The right ventricular size is normal. Right ventricular systolic function is normal. Left Atrium: Left atrial size was normal in size. Right Atrium: Right atrial size was normal in size. Pericardium: There is no evidence of  pericardial effusion. Mitral Valve: The mitral valve was not well visualized. No evidence of mitral valve regurgitation. Tricuspid Valve: The tricuspid valve is normal in structure. Tricuspid valve regurgitation is mild. Aortic Valve: The aortic valve is tricuspid. Aortic valve regurgitation is not visualized. Aortic valve mean gradient measures 3.0 mmHg. Aortic valve peak gradient measures 5.7 mmHg. Aortic valve area, by VTI measures 3.67 cm. Pulmonic Valve: The pulmonic valve was not well visualized. Pulmonic valve regurgitation is not visualized. Aorta: The aortic root and ascending aorta are structurally normal, with no evidence of dilitation. Venous: The inferior vena cava is normal in size with greater than 50% respiratory variability, suggesting right atrial pressure of 3 mmHg. IAS/Shunts: No atrial level shunt detected by color flow Doppler.  LEFT VENTRICLE PLAX 2D LVIDd:         3.90 cm     Diastology LVIDs:         2.90 cm     LV e' medial:    7.83 cm/s LV PW:         0.90 cm     LV E/e' medial:  16.1 LV IVS:        0.90 cm     LV e'  lateral:   10.80 cm/s LVOT diam:     2.20 cm     LV E/e' lateral: 11.7 LV SV:         94 LV SV Index:   56 LVOT Area:     3.80 cm  LV Volumes (MOD) LV vol d, MOD A2C: 49.2 ml LV vol d, MOD A4C: 53.1 ml LV vol s, MOD A2C: 24.4 ml LV vol s, MOD A4C: 26.4 ml LV SV MOD A2C:     24.8 ml LV SV MOD A4C:     53.1 ml LV SV MOD BP:      26.4 ml RIGHT VENTRICLE             IVC RV Basal diam:  2.40 cm     IVC diam: 2.00 cm RV S prime:     12.10 cm/s TAPSE (M-mode): 2.4 cm LEFT ATRIUM           Index        RIGHT ATRIUM          Index LA diam:      2.90 cm 1.75 cm/m   RA Area:     7.92 cm LA Vol (A2C): 16.8 ml 10.14 ml/m  RA Volume:   11.20 ml 6.76 ml/m LA Vol (A4C): 16.1 ml 9.72 ml/m  AORTIC VALVE AV Area (Vmax):    3.23 cm AV Area (Vmean):   3.39 cm AV Area (VTI):     3.67 cm AV Vmax:           119.00 cm/s AV Vmean:          81.400 cm/s AV VTI:            0.255 m AV Peak Grad:      5.7 mmHg AV Mean Grad:      3.0 mmHg LVOT Vmax:         101.00 cm/s LVOT Vmean:        72.500 cm/s LVOT VTI:          0.246 m LVOT/AV VTI ratio: 0.96  AORTA Ao Asc diam: 3.00 cm MITRAL VALVE                TRICUSPID VALVE MV Area (PHT): 4.63 cm  TR Peak grad:   26.0 mmHg MV Decel Time: 164 msec     TR Vmax:        255.00 cm/s MV E velocity: 126.00 cm/s MV A velocity: 88.80 cm/s   SHUNTS MV E/A ratio:  1.42         Systemic VTI:  0.25 m                             Systemic Diam: 2.20 cm Carolan Clines Electronically signed by Carolan Clines Signature Date/Time: 07/20/2023/1:00:16 PM    Final    NM Pulmonary Perfusion  Result Date: 07/20/2023 CLINICAL DATA:  Pulmonary embolism (PE) suspected, high prob EXAM: NUCLEAR MEDICINE PERFUSION LUNG SCAN TECHNIQUE: Perfusion images were obtained in multiple projections after intravenous injection of radiopharmaceutical. Ventilation scans intentionally deferred if perfusion scan and chest x-ray adequate for interpretation during COVID 19 epidemic. RADIOPHARMACEUTICALS:  4.1 mCi Tc-44m MAA IV COMPARISON:   Chest x-ray 07/20/2023 FINDINGS: Relatively homogeneous distribution of radiopharmaceutical within both lungs. No segmental or larger perfusion defect. IMPRESSION: Low probability for pulmonary embolism. Electronically Signed   By: Duanne Guess D.O.   On: 07/20/2023 12:25   DG Chest Port 1 View  Result Date: 07/20/2023 CLINICAL DATA:  77 year old female with history of shortness of breath. EXAM: PORTABLE CHEST 1 VIEW COMPARISON:  Chest x-ray 07/19/2023. FINDINGS: Lung volumes are normal. No consolidative airspace disease. No pleural effusions. No pneumothorax. No pulmonary nodule or mass noted. Pulmonary vasculature and the cardiomediastinal silhouette are within normal limits. Atherosclerosis in the thoracic aorta. Status post right shoulder arthroplasty. Old healed right-sided rib fractures are incidentally noted. IMPRESSION: 1.  No radiographic evidence of acute cardiopulmonary disease. 2. Aortic atherosclerosis. Electronically Signed   By: Trudie Reed M.D.   On: 07/20/2023 06:34   CT CHEST ABDOMEN PELVIS WO CONTRAST  Result Date: 07/19/2023 CLINICAL DATA:  Chest pain hypoxia and cough. Nausea vomiting and abdominal pain. EXAM: CT CHEST, ABDOMEN AND PELVIS WITHOUT CONTRAST TECHNIQUE: Multidetector CT imaging of the chest, abdomen and pelvis was performed following the standard protocol without IV contrast. RADIATION DOSE REDUCTION: This exam was performed according to the departmental dose-optimization program which includes automated exposure control, adjustment of the mA and/or kV according to patient size and/or use of iterative reconstruction technique. COMPARISON:  CT urogram 09/08/2022 FINDINGS: CT CHEST FINDINGS Cardiovascular: The heart size is normal. No substantial pericardial effusion. Coronary artery calcification is evident. Mild atherosclerotic calcification is noted in the wall of the thoracic aorta. Mediastinum/Nodes: No mediastinal lymphadenopathy. No evidence for gross hilar  lymphadenopathy although assessment is limited by the lack of intravenous contrast on the current study. Small to moderate hiatal hernia. The esophagus has normal imaging features. There is no axillary lymphadenopathy. Lungs/Pleura: Dependent collapse/consolidation noted in the lung bases, left greater than right. No pulmonary edema or substantial pleural effusion. No suspicious pulmonary nodule or mass. Musculoskeletal: No worrisome lytic or sclerotic osseous abnormality. Right shoulder replacement CT ABDOMEN PELVIS FINDINGS Hepatobiliary: No suspicious focal abnormality in the liver on this study without intravenous contrast. 7 mm calcified gallstone evident. No intrahepatic or extrahepatic biliary dilation. Pancreas: No focal mass lesion. No dilatation of the main duct. No intraparenchymal cyst. No peripancreatic edema. Spleen: No splenomegaly. No suspicious focal mass lesion. Adrenals/Urinary Tract: No adrenal nodule or mass. Right kidney and ureter unremarkable. 7 mm nonobstructing stone lower pole left kidney. No left ureteral stone. No left hydroureteronephrosis. The urinary bladder appears normal  for the degree of distention. Stomach/Bowel: Small to moderate hiatal hernia. Stomach otherwise unremarkable. Duodenum is normally positioned as is the ligament of Treitz. No small bowel wall thickening. No small bowel dilatation. The terminal ileum is normal. The appendix is normal. Colon is decompressed except for the rectum which is distended up to 7 cm diameter with large stool volume evident. Vascular/Lymphatic: There is mild atherosclerotic calcification of the abdominal aorta without aneurysm. There is no gastrohepatic or hepatoduodenal ligament lymphadenopathy. No retroperitoneal or mesenteric lymphadenopathy. No pelvic sidewall lymphadenopathy. Reproductive: Hysterectomy.  There is no adnexal mass. Other: No intraperitoneal free fluid. Musculoskeletal: Bones are diffusely demineralized. Stable L5  compression deformity. IMPRESSION: 1. Dependent collapse/consolidation in the lung bases, left greater than right. Imaging features to likely related to atelectasis although pneumonia not entirely excluded. 2. Small to moderate hiatal hernia. 3. Cholelithiasis. 4. 7 mm nonobstructing stone lower pole left kidney. 5. Large stool volume in the rectum with distention up to 7 cm diameter. 6.  Aortic Atherosclerosis (ICD10-I70.0). Electronically Signed   By: Kennith Center M.D.   On: 07/19/2023 16:39   US Abdomen Limited RUQ (LIVER/GB)  Result Date: 07/19/2023 CLINICAL DATA:  [id number] Epigastric abdominal pain [id number] EXAM: ULTRASOUND ABDOMEN LIMITED RIGHT UPPER QUADRANT COMPARISON:  CT chest 07/09/2022 FINDINGS: Gallbladder: Gallbladder wall thickening up to 6.6 mm. Nonmobile cyst 9 mm gallstone in the gallbladder neck. No pericholecystic fluid. Sonographer describes sonographic Murphy's sign. Common bile duct: Diameter: 2.6 mm.  No intrahepatic biliary ductal dilatation. Liver: No focal lesion identified. Within normal limits in parenchymal echogenicity. Portal vein is patent on color Doppler imaging with normal direction of blood flow towards the liver. Other: Technologist describes technically difficult study secondary to body habitus. IMPRESSION: Cholelithiasis with gallbladder wall thickening and positive sonographic Murphy's sign, suggesting acute cholecystitis. Electronically Signed   By: Corlis Leak M.D.   On: 07/19/2023 16:31   DG Chest Port 1 View  Result Date: 07/19/2023 CLINICAL DATA:  sob EXAM: PORTABLE CHEST 1 VIEW COMPARISON:  CXR 07/10/23 FINDINGS: Assessment of the bilateral upper lobes is limited due to patient positioning. Within this limitation, no pleural effusion. No pneumothorax. Linear opacity in the right lung base that appears unchanged from prior exam and most likely represents atelectasis. Unchanged cardiac and mediastinal contours. No new focal airspace opacity. No radiographically apparent  displaced rib fractures. Visualized upper abdomen unremarkable. Right shoulder arthroplasty. IMPRESSION: Right basilar atelectasis. Electronically Signed   By: Lorenza Cambridge M.D.   On: 07/19/2023 13:33   (Echo, Carotid, EGD, Colonoscopy, ERCP)    Subjective:  No further nausea, she denies any complaints today, tolerating oral intake Discharge Exam: Vitals:   07/25/23 0400 07/25/23 0800  BP: 120/64 (!) 118/99  Pulse: 72 75  Resp: 20 20  Temp: 98.1 F (36.7 C) 98.4 F (36.9 C)  SpO2: 93% 90%   Vitals:   07/24/23 2000 07/25/23 0000 07/25/23 0400 07/25/23 0800  BP: (!) 91/51 124/72 120/64 (!) 118/99  Pulse: 84 77 72 75  Resp: 19 20 20 20   Temp: 98 F (36.7 C) 97.8 F (36.6 C) 98.1 F (36.7 C) 98.4 F (36.9 C)  TempSrc: Oral Oral Oral Oral  SpO2: 94% 91% 93% 90%  Weight:      Height:        General: Pt is alert, awake, not in acute distress Cardiovascular: RRR, S1/S2 +, no rubs, no gallops Respiratory: Diminished air entry at the bases Abdominal: Soft, NT, ND, bowel sounds + Extremities: no edema, no  cyanosis, chronic lower extremity weakness, bedbound/wheelchair dependent     The results of significant diagnostics from this hospitalization (including imaging, microbiology, ancillary and laboratory) are listed below for reference.     Microbiology: Recent Results (from the past 240 hour(s))  Resp panel by RT-PCR (RSV, Flu A&B, Covid) Anterior Nasal Swab     Status: None   Collection Time: 07/19/23  3:25 PM   Specimen: Anterior Nasal Swab  Result Value Ref Range Status   SARS Coronavirus 2 by RT PCR NEGATIVE NEGATIVE Final   Influenza A by PCR NEGATIVE NEGATIVE Final   Influenza B by PCR NEGATIVE NEGATIVE Final    Comment: (NOTE) The Xpert Xpress SARS-CoV-2/FLU/RSV plus assay is intended as an aid in the diagnosis of influenza from Nasopharyngeal swab specimens and should not be used as a sole basis for treatment. Nasal washings and aspirates are unacceptable for  Xpert Xpress SARS-CoV-2/FLU/RSV testing.  Fact Sheet for Patients: BloggerCourse.com  Fact Sheet for Healthcare Providers: SeriousBroker.it  This test is not yet approved or cleared by the [place] FDA and has been authorized for detection and/or diagnosis of SARS-CoV-2 by FDA under an Emergency Use Authorization (EUA). This EUA will remain in effect (meaning this test can be used) for the duration of the COVID-19 declaration under Section 564(b)(1) of the Act, 21 U.S.C. section 360bbb-3(b)(1), unless the authorization is terminated or revoked.     Resp Syncytial Virus by PCR NEGATIVE NEGATIVE Final    Comment: (NOTE) Fact Sheet for Patients: BloggerCourse.com  Fact Sheet for Healthcare Providers: SeriousBroker.it  This test is not yet approved or cleared by the [place] FDA and has been authorized for detection and/or diagnosis of SARS-CoV-2 by FDA under an Emergency Use Authorization (EUA). This EUA will remain in effect (meaning this test can be used) for the duration of the COVID-19 declaration under Section 564(b)(1) of the Act, 21 U.S.C. section 360bbb-3(b)(1), unless the authorization is terminated or revoked.  Performed at [facility], [address], [place], [place] [address]   Blood culture (routine x 2)     Status: Abnormal   Collection Time: 07/19/23  6:10 PM   Specimen: BLOOD  Result Value Ref Range Status   Specimen Description BLOOD BLOOD RIGHT ARM  Final   Special Requests   Final    BOTTLES DRAWN AEROBIC AND ANAEROBIC Blood Culture results may not be optimal due to an inadequate volume of blood received in culture bottles   Culture  Setup Time   Final    GRAM NEGATIVE RODS IN BOTH AEROBIC AND ANAEROBIC BOTTLES CRITICAL RESULT CALLED TO, READ BACK BY AND VERIFIED WITH: E. MARTIN PHARMD, AT 1123 07/20/23 D. VANHOOK    Culture (A)  Final     PROTEUS MIRABILIS SUSCEPTIBILITIES PERFORMED ON PREVIOUS CULTURE WITHIN THE LAST 5 DAYS. Performed at [facility], [address] [place], [place] [address]    Report Status 07/22/2023 FINAL  Final  Blood culture (routine x 2)     Status: Abnormal   Collection Time: 07/19/23  6:10 PM   Specimen: BLOOD  Result Value Ref Range Status   Specimen Description BLOOD BLOOD RIGHT ARM  Final   Special Requests   Final    BOTTLES DRAWN AEROBIC AND ANAEROBIC Blood Culture results may not be optimal due to an inadequate volume of blood received in culture bottles   Culture  Setup Time   Final    GRAM NEGATIVE RODS IN BOTH AEROBIC AND ANAEROBIC  BOTTLES Organism ID to follow CRITICAL RESULT CALLED TO, READ BACK BY AND VERIFIED WITHAntoine Primas PHARMD, AT 1114 07/20/23 Renato Shin Performed at [facility], [address], [place], [place] [address]    Culture PROTEUS MIRABILIS (A)  Final   Report Status 07/22/2023 FINAL  Final   Organism ID, Bacteria PROTEUS MIRABILIS  Final      Susceptibility   Proteus mirabilis - MIC*    AMPICILLIN <=2 SENSITIVE Sensitive     CEFEPIME <=0.12 SENSITIVE Sensitive     CEFTAZIDIME <=1 SENSITIVE Sensitive     CEFTRIAXONE <=0.25 SENSITIVE Sensitive     CIPROFLOXACIN <=0.25 SENSITIVE Sensitive     GENTAMICIN <=1 SENSITIVE Sensitive     IMIPENEM 2 SENSITIVE Sensitive     TRIMETH/SULFA <=20 SENSITIVE Sensitive     AMPICILLIN/SULBACTAM <=2 SENSITIVE Sensitive     PIP/TAZO <=4 SENSITIVE Sensitive     * PROTEUS MIRABILIS  Blood Culture ID Panel (Reflexed)     Status: Abnormal   Collection Time: 07/19/23  6:10 PM  Result Value Ref Range Status   Enterococcus faecalis NOT DETECTED NOT DETECTED Final   Enterococcus Faecium NOT DETECTED NOT DETECTED Final   Listeria monocytogenes NOT DETECTED NOT DETECTED Final   Staphylococcus species NOT DETECTED NOT DETECTED Final   Staphylococcus aureus (BCID) NOT DETECTED NOT DETECTED Final   Staphylococcus epidermidis  NOT DETECTED NOT DETECTED Final   Staphylococcus lugdunensis NOT DETECTED NOT DETECTED Final   Streptococcus species NOT DETECTED NOT DETECTED Final   Streptococcus agalactiae NOT DETECTED NOT DETECTED Final   Streptococcus pneumoniae NOT DETECTED NOT DETECTED Final   Streptococcus pyogenes NOT DETECTED NOT DETECTED Final   A.calcoaceticus-baumannii NOT DETECTED NOT DETECTED Final   Bacteroides fragilis NOT DETECTED NOT DETECTED Final   Enterobacterales DETECTED (A) NOT DETECTED Final    Comment: Enterobacterales represent a large order of gram negative bacteria, not a single organism. CRITICAL RESULT CALLED TO, READ BACK BY AND VERIFIED WITH: E. MARTIN PHARMD, AT 1114 07/20/23 D. VANHOOK    Enterobacter cloacae complex NOT DETECTED NOT DETECTED Final   Escherichia coli NOT DETECTED NOT DETECTED Final   Klebsiella aerogenes NOT DETECTED NOT DETECTED Final   Klebsiella oxytoca NOT DETECTED NOT DETECTED Final   Klebsiella pneumoniae NOT DETECTED NOT DETECTED Final   Proteus species DETECTED (A) NOT DETECTED Final    Comment: CRITICAL RESULT CALLED TO, READ BACK BY AND VERIFIED WITH: E. MARTIN PHARMD, AT 1114 07/20/23 D. VANHOOK    Salmonella species NOT DETECTED NOT DETECTED Final   Serratia marcescens NOT DETECTED NOT DETECTED Final   Haemophilus influenzae NOT DETECTED NOT DETECTED Final   Neisseria meningitidis NOT DETECTED NOT DETECTED Final   Pseudomonas aeruginosa NOT DETECTED NOT DETECTED Final   Stenotrophomonas maltophilia NOT DETECTED NOT DETECTED Final   Candida albicans NOT DETECTED NOT DETECTED Final   Candida auris NOT DETECTED NOT DETECTED Final   Candida glabrata NOT DETECTED NOT DETECTED Final   Candida krusei NOT DETECTED NOT DETECTED Final   Candida parapsilosis NOT DETECTED NOT DETECTED Final   Candida tropicalis NOT DETECTED NOT DETECTED Final   Cryptococcus neoformans/gattii NOT DETECTED NOT DETECTED Final   CTX-M ESBL NOT DETECTED NOT DETECTED Final   Carbapenem  resistance IMP NOT DETECTED NOT DETECTED Final   Carbapenem resistance KPC NOT DETECTED NOT DETECTED Final   Carbapenem resistance NDM NOT DETECTED NOT DETECTED Final   Carbapenem resist OXA 48 LIKE NOT DETECTED NOT DETECTED Final   Carbapenem resistance VIM  NOT DETECTED NOT DETECTED Final    Comment: Performed at [facility], [address] [place], [place] [address]  MRSA Next Gen by PCR, Nasal     Status: None   Collection Time: 07/19/23  9:05 PM   Specimen: Nasal Mucosa; Nasal Swab  Result Value Ref Range Status   MRSA by PCR Next Gen NOT DETECTED NOT DETECTED Final    Comment: (NOTE) The GeneXpert MRSA Assay (FDA approved for NASAL specimens only), is one component of a comprehensive MRSA colonization surveillance program. It is not intended to diagnose MRSA infection nor to guide or monitor treatment for MRSA infections. Test performance is not FDA approved in patients less than 41 years old. Performed at [facility], [address] [place], [place] [address]      Labs: BNP (last 3 results) Recent Labs    07/22/23 0106 07/23/23 0014 07/24/23 0221  BNP 517.6* 451.7* 188.2*   Basic Metabolic Panel: Recent Labs  Lab 07/21/23 0558 07/22/23 0106 07/23/23 0014 07/24/23 0221 07/25/23 0248  NA 139 138 142 138 139  K 3.8 3.5 3.3* 4.1 4.0  CL 111 110 109 106 104  CO2 17* 21* 25 25 27   GLUCOSE 112* 170* 95 94 97  BUN 13 19 10  6* 6*  CREATININE 0.76 0.79 0.57 0.50 0.52  CALCIUM 7.7* 8.1* 8.2* 8.3* 8.4*  MG 1.6* 2.6* 1.9 1.8  --   PHOS  --   --  1.4* 1.5*  --    Liver Function Tests: Recent Labs  Lab 07/21/23 0558 07/22/23 0106 07/23/23 0014 07/24/23 0221 07/25/23 0248  AST 33 21 17 26  61*  ALT 23 19 19 21  43  ALKPHOS 82 89 82 91 81  BILITOT 0.7 0.4 0.4 0.6 0.5  PROT 5.3* 4.9* 5.0* 5.2* 5.3*  ALBUMIN 2.3* 2.1* 2.3* 2.4* 2.4*   Recent Labs  Lab 07/19/23 1204  LIPASE 36   No results for input(s): "AMMONIA" in the last 168  hours. CBC: Recent Labs  Lab 07/21/23 0558 07/22/23 0106 07/23/23 0014 07/24/23 0221 07/25/23 0248  WBC 22.9* 26.9* 19.2* 12.5* 15.0*  NEUTROABS 21.0* 24.0* 14.3* 6.5 10.7*  HGB 12.0 11.6* 12.0 11.9* 12.0  HCT 36.5 34.7* 36.8 35.9* 36.4  MCV 91.3 93.0 90.2 90.7 92.6  PLT 220 214 228 223 277   Cardiac Enzymes: No results for input(s): "CKTOTAL", "CKMB", "CKMBINDEX", "TROPONINI" in the last 168 hours. BNP: Invalid input(s): "POCBNP" CBG: Recent Labs  Lab 07/21/23 0839 07/24/23 2144  GLUCAP 121* 141*   D-Dimer No results for input(s): "DDIMER" in the last 72 hours. Hgb A1c No results for input(s): "HGBA1C" in the last 72 hours. Lipid Profile No results for input(s): "CHOL", "HDL", "LDLCALC", "TRIG", "CHOLHDL", "LDLDIRECT" in the last 72 hours. Thyroid function studies No results for input(s): "TSH", "T4TOTAL", "T3FREE", "THYROIDAB" in the last 72 hours.  Invalid input(s): "FREET3" Anemia work up No results for input(s): "VITAMINB12", "FOLATE", "FERRITIN", "TIBC", "IRON", "RETICCTPCT" in the last 72 hours. Urinalysis    Component Value Date/Time   COLORURINE STRAW (A) 11/26/2019 1046   APPEARANCEUR CLEAR 11/26/2019 1046   LABSPEC 1.010 11/26/2019 1046   PHURINE 7.0 11/26/2019 1046   GLUCOSEU 50 (A) 11/26/2019 1046   HGBUR NEGATIVE 11/26/2019 1046   BILIRUBINUR negative 04/05/2020 1423   KETONESUR 20 (A) 11/26/2019 1046   PROTEINUR Negative 04/05/2020 1423   PROTEINUR NEGATIVE 11/26/2019 1046   UROBILINOGEN 0.2 04/05/2020 1423   UROBILINOGEN 0.2 05/26/2007 2307   NITRITE  negative 04/05/2020 1423   NITRITE NEGATIVE 11/26/2019 1046   LEUKOCYTESUR Negative 04/05/2020 1423   LEUKOCYTESUR NEGATIVE 11/26/2019 1046   Sepsis Labs Recent Labs  Lab 07/22/23 0106 07/23/23 0014 07/24/23 0221 07/25/23 0248  WBC 26.9* 19.2* 12.5* 15.0*   Microbiology Recent Results (from the past 240 hour(s))  Resp panel by RT-PCR (RSV, Flu A&B, Covid) Anterior Nasal Swab      Status: None   Collection Time: 07/19/23  3:25 PM   Specimen: Anterior Nasal Swab  Result Value Ref Range Status   SARS Coronavirus 2 by RT PCR NEGATIVE NEGATIVE Final   Influenza A by PCR NEGATIVE NEGATIVE Final   Influenza B by PCR NEGATIVE NEGATIVE Final    Comment: (NOTE) The Xpert Xpress SARS-CoV-2/FLU/RSV plus assay is intended as an aid in the diagnosis of influenza from Nasopharyngeal swab specimens and should not be used as a sole basis for treatment. Nasal washings and aspirates are unacceptable for Xpert Xpress SARS-CoV-2/FLU/RSV testing.  Fact Sheet for Patients: BloggerCourse.com  Fact Sheet for Healthcare Providers: SeriousBroker.it  This test is not yet approved or cleared by the [place] FDA and has been authorized for detection and/or diagnosis of SARS-CoV-2 by FDA under an Emergency Use Authorization (EUA). This EUA will remain in effect (meaning this test can be used) for the duration of the COVID-19 declaration under Section 564(b)(1) of the Act, 21 U.S.C. section 360bbb-3(b)(1), unless the authorization is terminated or revoked.     Resp Syncytial Virus by PCR NEGATIVE NEGATIVE Final    Comment: (NOTE) Fact Sheet for Patients: BloggerCourse.com  Fact Sheet for Healthcare Providers: SeriousBroker.it  This test is not yet approved or cleared by the [place] FDA and has been authorized for detection and/or diagnosis of SARS-CoV-2 by FDA under an Emergency Use Authorization (EUA). This EUA will remain in effect (meaning this test can be used) for the duration of the COVID-19 declaration under Section 564(b)(1) of the Act, 21 U.S.C. section 360bbb-3(b)(1), unless the authorization is terminated or revoked.  Performed at [facility], [address] [place], [place] [address]   Blood culture (routine x 2)     Status: Abnormal   Collection  Time: 07/19/23  6:10 PM   Specimen: BLOOD  Result Value Ref Range Status   Specimen Description BLOOD BLOOD RIGHT ARM  Final   Special Requests   Final    BOTTLES DRAWN AEROBIC AND ANAEROBIC Blood Culture results may not be optimal due to an inadequate volume of blood received in culture bottles   Culture  Setup Time   Final    GRAM NEGATIVE RODS IN BOTH AEROBIC AND ANAEROBIC BOTTLES CRITICAL RESULT CALLED TO, READ BACK BY AND VERIFIED WITH: E. MARTIN PHARMD, AT 1123 07/20/23 D. VANHOOK    Culture (A)  Final    PROTEUS MIRABILIS SUSCEPTIBILITIES PERFORMED ON PREVIOUS CULTURE WITHIN THE LAST 5 DAYS. Performed at [facility], [address] [place], [place] [address]    Report Status 07/22/2023 FINAL  Final  Blood culture (routine x 2)     Status: Abnormal   Collection Time: 07/19/23  6:10 PM   Specimen: BLOOD  Result Value Ref Range Status   Specimen Description BLOOD BLOOD RIGHT ARM  Final   Special Requests   Final    BOTTLES DRAWN AEROBIC AND ANAEROBIC Blood Culture results may not be optimal due to an inadequate volume of blood received in culture bottles   Culture  Setup Time   Final  GRAM NEGATIVE RODS IN BOTH AEROBIC AND ANAEROBIC BOTTLES Organism ID to follow CRITICAL RESULT CALLED TO, READ BACK BY AND VERIFIED WITHAntoine Primas PHARMD, AT 1114 07/20/23 Renato Shin Performed at [facility], [address], [place], [place] [address]    Culture PROTEUS MIRABILIS (A)  Final   Report Status 07/22/2023 FINAL  Final   Organism ID, Bacteria PROTEUS MIRABILIS  Final      Susceptibility   Proteus mirabilis - MIC*    AMPICILLIN <=2 SENSITIVE Sensitive     CEFEPIME <=0.12 SENSITIVE Sensitive     CEFTAZIDIME <=1 SENSITIVE Sensitive     CEFTRIAXONE <=0.25 SENSITIVE Sensitive     CIPROFLOXACIN <=0.25 SENSITIVE Sensitive     GENTAMICIN <=1 SENSITIVE Sensitive     IMIPENEM 2 SENSITIVE Sensitive     TRIMETH/SULFA <=20 SENSITIVE Sensitive     AMPICILLIN/SULBACTAM <=2  SENSITIVE Sensitive     PIP/TAZO <=4 SENSITIVE Sensitive     * PROTEUS MIRABILIS  Blood Culture ID Panel (Reflexed)     Status: Abnormal   Collection Time: 07/19/23  6:10 PM  Result Value Ref Range Status   Enterococcus faecalis NOT DETECTED NOT DETECTED Final   Enterococcus Faecium NOT DETECTED NOT DETECTED Final   Listeria monocytogenes NOT DETECTED NOT DETECTED Final   Staphylococcus species NOT DETECTED NOT DETECTED Final   Staphylococcus aureus (BCID) NOT DETECTED NOT DETECTED Final   Staphylococcus epidermidis NOT DETECTED NOT DETECTED Final   Staphylococcus lugdunensis NOT DETECTED NOT DETECTED Final   Streptococcus species NOT DETECTED NOT DETECTED Final   Streptococcus agalactiae NOT DETECTED NOT DETECTED Final   Streptococcus pneumoniae NOT DETECTED NOT DETECTED Final   Streptococcus pyogenes NOT DETECTED NOT DETECTED Final   A.calcoaceticus-baumannii NOT DETECTED NOT DETECTED Final   Bacteroides fragilis NOT DETECTED NOT DETECTED Final   Enterobacterales DETECTED (A) NOT DETECTED Final    Comment: Enterobacterales represent a large order of gram negative bacteria, not a single organism. CRITICAL RESULT CALLED TO, READ BACK BY AND VERIFIED WITH: E. MARTIN PHARMD, AT 1114 07/20/23 D. VANHOOK    Enterobacter cloacae complex NOT DETECTED NOT DETECTED Final   Escherichia coli NOT DETECTED NOT DETECTED Final   Klebsiella aerogenes NOT DETECTED NOT DETECTED Final   Klebsiella oxytoca NOT DETECTED NOT DETECTED Final   Klebsiella pneumoniae NOT DETECTED NOT DETECTED Final   Proteus species DETECTED (A) NOT DETECTED Final    Comment: CRITICAL RESULT CALLED TO, READ BACK BY AND VERIFIED WITH: E. MARTIN PHARMD, AT 1114 07/20/23 D. VANHOOK    Salmonella species NOT DETECTED NOT DETECTED Final   Serratia marcescens NOT DETECTED NOT DETECTED Final   Haemophilus influenzae NOT DETECTED NOT DETECTED Final   Neisseria meningitidis NOT DETECTED NOT DETECTED Final   Pseudomonas aeruginosa NOT  DETECTED NOT DETECTED Final   Stenotrophomonas maltophilia NOT DETECTED NOT DETECTED Final   Candida albicans NOT DETECTED NOT DETECTED Final   Candida auris NOT DETECTED NOT DETECTED Final   Candida glabrata NOT DETECTED NOT DETECTED Final   Candida krusei NOT DETECTED NOT DETECTED Final   Candida parapsilosis NOT DETECTED NOT DETECTED Final   Candida tropicalis NOT DETECTED NOT DETECTED Final   Cryptococcus neoformans/gattii NOT DETECTED NOT DETECTED Final   CTX-M ESBL NOT DETECTED NOT DETECTED Final   Carbapenem resistance IMP NOT DETECTED NOT DETECTED Final   Carbapenem resistance KPC NOT DETECTED NOT DETECTED Final   Carbapenem resistance NDM NOT DETECTED NOT DETECTED Final   Carbapenem resist OXA 48 LIKE NOT DETECTED  NOT DETECTED Final   Carbapenem resistance VIM NOT DETECTED NOT DETECTED Final    Comment: Performed at [facility], [address] [place], [place] [address]  MRSA Next Gen by PCR, Nasal     Status: None   Collection Time: 07/19/23  9:05 PM   Specimen: Nasal Mucosa; Nasal Swab  Result Value Ref Range Status   MRSA by PCR Next Gen NOT DETECTED NOT DETECTED Final    Comment: (NOTE) The GeneXpert MRSA Assay (FDA approved for NASAL specimens only), is one component of a comprehensive MRSA colonization surveillance program. It is not intended to diagnose MRSA infection nor to guide or monitor treatment for MRSA infections. Test performance is not FDA approved in patients less than 58 years old. Performed at [facility], [address], [place], [place] [address]      Time coordinating discharge: Over 30 minutes  SIGNED:   Huey Bienenstock, MD  [facility] 07/25/2023, 10:59 AM Pager   If 7PM-7AM, please contact night-coverage [email or web address] Password [email or web address]

## 2023-07-25 NOTE — TOC Transition Note (Signed)
Transition of Care [facility]) - CM/SW Discharge Note   Patient Details  Name: [name] MRN: [id number] Date of Birth: 04/03/1946  Transition of Care [facility] CM/SW Contact:  Helene Kelp, LCSW Phone Number: 07/25/2023, 10:59 AM  Clinical Narrative:    CSW followed-up the patient's disposition (return to facility). CSW was contacted by provider (TOC/CMElgerywood, MD) to facilitate the patient's return back to the facility (SNF).  CSW met with the patient at the bedside and reviewed their current disposition (SNF transfer to the facility [facility]). Patient noted they were aware of the transfer to the facility.   CSW contacted the patient's facility [facility]) and spoke with the representative Casimiro Needle). CSW provided update informtion regarding the patient's current status and reviewed discharge process for the patient's to transfer to the facility. Facility rep: Casimiro Needle) requested d/c summary for review.  CSW contacted the patient's natural support Triniece Zajicek / [phone]). CSW updated  the patient's current disposition and when transport to the facility will take place.  [name] stated he appreciated the update regarding the patient's disposition.   CSW contacted [facility] and arranged transport to the facility [facility]). CSW spoke with [facility] representative (badge# [phone]) and transport was arranged.   CSW updated the clinical team members on the floor to efforts made to facilitate pt's disposition (NF placement transfer) and provided [facility] transport date and time of transport.  Date: 07/25/23 Time: 1:00 PM  Discharge packet and information for the patient to transfer at the nursing station.   CSW updated the patient to the information above.   No other needs identified or requested of this writer at this current time. Please consult TOC if new need arises.    Final next level of care: Skilled Nursing Facility Barriers to Discharge: Continued Medical Work up   Patient Goals  and CMS Choice CMS Medicare.gov Compare Post Acute Care list provided to:: Patient Choice offered to / list presented to : Patient  Discharge Placement SNF   Patient to be transferred to facility by: [facility] SNF Name of family member notified: Lilia Pro (S) Patient and family notified of of transfer: 07/25/23  Discharge Plan and Services Additional resources added to the After Visit Summary for   In-house Referral: Clinical Social Work   Post Acute Care Choice: Skilled Nursing Facility                               Social Determinants of Health (SDOH) Interventions SDOH Screenings   Food Insecurity: No Food Insecurity (07/20/2023)  Housing: Low Risk  (07/20/2023)  Transportation Needs: No Transportation Needs (07/20/2023)  Utilities: Not At Risk (07/20/2023)  Depression (PHQ2-9): Low Risk  (02/10/2023)  Financial Resource Strain: Low Risk  (02/17/2023)  Stress: No Stress Concern Present (02/17/2023)  Tobacco Use: Low Risk  (07/20/2023)     Readmission Risk Interventions     No data to display

## 2023-07-25 NOTE — Plan of Care (Signed)

## 2023-11-05 ENCOUNTER — Telehealth: Payer: Self-pay | Admitting: Internal Medicine

## 2023-11-05 MED ORDER — METHENAMINE HIPPURATE 1 G PO TABS: 1.0000 g | ORAL_TABLET | ORAL | 0 refills | Status: DC

## 2023-11-05 NOTE — Telephone Encounter (Signed)
Rx sent electronically.  

## 2023-11-05 NOTE — Telephone Encounter (Signed)
Prescription Request  11/05/2023  LOV: 02/10/2023  What is the name of the medication or equipment? methenamine (HIPREX) 1 g tablet   Have you contacted your pharmacy to request a refill? No   Which pharmacy would you like this sent to?   [id number] Judithann Sheen, [place] - [address] [place] [address] Phone: [phone] Fax: [phone]    Patient notified that their request is being sent to the clinical staff for review and that they should receive a response within 2 business days.   Please advise at Mobile [phone] (mobile)

## 2023-12-15 ENCOUNTER — Other Ambulatory Visit: Payer: Self-pay | Admitting: Urology

## 2023-12-24 ENCOUNTER — Encounter (HOSPITAL_COMMUNITY): Payer: Self-pay | Admitting: Urology

## 2023-12-24 NOTE — Progress Notes (Signed)
 For Anesthesia: PCP - Charlie LILLETTE Denise, MD  Cardiologist -   Chest x-ray - 07/21/23 in [facility] EKG - 07/21/23 in [facility] Stress Test -  ECHO - 07/20/23 in [facility] Cardiac Cath -  Pacemaker/ICD device last checked: Pacemaker orders received: Device Rep notified:  Spinal Cord Stimulator:  Sleep Study -  CPAP -   Fasting Blood Sugar -  Checks Blood Sugar _____ times a day Date and result of last Hgb A1c-  Last dose of GLP1 agonist-  GLP1 instructions:   Last dose of SGLT-2 inhibitors-  SGLT-2 instructions:  Blood Thinner Instructions: Aspirin Instructions: Last Dose:  Activity level: Can go up a flight of stairs and activities of daily living without stopping and without chest pain and/or shortness of breath   Able to exercise without chest pain and/or shortness of breath   Unable to go up a flight of stairs without chest pain and/or shortness of breath     Anesthesia review:   Patient denies shortness of breath, fever, cough and chest pain during pre op phone call.   Patient verbalized understanding of instructions reviewed via telephone.

## 2023-12-24 NOTE — Patient Instructions (Signed)
 SURGICAL WAITING ROOM VISITATION Patients having surgery or a procedure may have no more than 2 support people in the waiting area - these visitors may rotate.    Children under the age of 72 must have an adult with them who is not the patient.  If the patient needs to stay at the hospital during part of their recovery, the visitor guidelines for inpatient rooms apply. Pre-op nurse will coordinate an appropriate time for 1 support person to accompany patient in pre-op.  This support person may not rotate.    Please refer to the [facility] website for the visitor guidelines for Inpatients (after your surgery is over and you are in a regular room).       Your procedure is scheduled on: 01-05-24   Report to [facility] Main Entrance    Report to admitting at 7:45 AM   Call this number if you have problems the morning of surgery [phone]   Do not eat food or drink liquids:After Midnight.           If you have questions, please contact your surgeon's office.   FOLLOW  ANY ADDITIONAL PRE OP INSTRUCTIONS YOU RECEIVED FROM YOUR SURGEON'S OFFICE!!!     Oral Hygiene is also important to reduce your risk of infection.                                    Remember - BRUSH YOUR TEETH THE MORNING OF SURGERY WITH YOUR REGULAR TOOTHPASTE   Do NOT smoke after Midnight   Take these medicines the morning of surgery with A SIP OF WATER:    Tylenol  if needed  Stop all vitamins and herbal supplements 7 days before surgery  DO NOT TAKE ANY ORAL DIABETIC MEDICATIONS DAY OF YOUR SURGERY  Bring CPAP mask and tubing day of surgery.                              You may not have any metal on your body including hair pins, jewelry, and body piercing             Do not wear make-up, lotions, powders, perfumes, or deodorant  Do not wear nail polish including gel and S&S, artificial/acrylic nails, or any other type of covering on natural nails including finger and toenails. If you have  artificial nails, gel coating, etc. that needs to be removed by a nail salon please have this removed prior to surgery or surgery may need to be canceled/ delayed if the surgeon/ anesthesia feels like they are unable to be safely monitored.   Do not shave  48 hours prior to surgery.    Do not bring valuables to the hospital. [place] IS NOT RESPONSIBLE   FOR VALUABLES.   Contacts, dentures or bridgework may not be worn into surgery.  DO NOT BRING YOUR HOME MEDICATIONS TO THE HOSPITAL. PHARMACY WILL DISPENSE MEDICATIONS LISTED ON YOUR MEDICATION LIST TO YOU DURING YOUR ADMISSION IN THE HOSPITAL!    Patients discharged on the day of surgery will not be allowed to drive home.  Someone NEEDS to stay with you for the first 24 hours after anesthesia.   Special Instructions: Bring a copy of your healthcare power of attorney and living will documents the day of surgery if you haven't scanned them before.  Please read over the following fact sheets you were given: IF YOU HAVE QUESTIONS ABOUT YOUR PRE-OP INSTRUCTIONS PLEASE CALL [phone] [facility]  If you received a COVID test during your pre-op visit  it is requested that you wear a mask when out in public, stay away from anyone that may not be feeling well and notify your surgeon if you develop symptoms. If you test positive for Covid or have been in contact with anyone that has tested positive in the last 10 days please notify you surgeon.  [place] - Preparing for Surgery Before surgery, you can play an important role.  Because skin is not sterile, your skin needs to be as free of germs as possible.  You can reduce the number of germs on your skin by washing with CHG (chlorahexidine gluconate) soap before surgery.  CHG is an antiseptic cleaner which kills germs and bonds with the skin to continue killing germs even after washing. Please DO NOT use if you have an allergy to CHG or antibacterial soaps.  If your skin becomes  reddened/irritated stop using the CHG and inform your nurse when you arrive at Short Stay. Do not shave (including legs and underarms) for at least 48 hours prior to the first CHG shower.  You may shave your face/neck.  Please follow these instructions carefully:  1.  Shower with CHG Soap the night before surgery and the  morning of surgery.  2.  If you choose to wash your hair, wash your hair first as usual with your normal  shampoo.  3.  After you shampoo, rinse your hair and body thoroughly to remove the shampoo.                             4.  Use CHG as you would any other liquid soap.  You can apply chg directly to the skin and wash.  Gently with a scrungie or clean washcloth.  5.  Apply the CHG Soap to your body ONLY FROM THE NECK DOWN.   Do   not use on face/ open                           Wound or open sores. Avoid contact with eyes, ears mouth and   genitals (private parts).                       Wash face,  Genitals (private parts) with your normal soap.             6.  Wash thoroughly, paying special attention to the area where your    surgery  will be performed.  7.  Thoroughly rinse your body with warm water from the neck down.  8.  DO NOT shower/wash with your normal soap after using and rinsing off the CHG Soap.                9.  Pat yourself dry with a clean towel.            10.  Wear clean pajamas.            11.  Place clean sheets on your bed the night of your first shower and do not  sleep with pets. Day of Surgery : Do not apply any lotions/deodorants the morning of surgery.  Please wear clean clothes to the hospital/surgery center.  FAILURE TO  FOLLOW THESE INSTRUCTIONS MAY RESULT IN THE CANCELLATION OF YOUR SURGERY  PATIENT SIGNATURE_________________________________  NURSE SIGNATURE__________________________________  ________________________________________________________________________

## 2023-12-28 ENCOUNTER — Encounter (HOSPITAL_COMMUNITY): Payer: Self-pay | Admitting: Urology

## 2023-12-28 NOTE — Progress Notes (Signed)
 COVID Vaccine Completed:  Yes  Date of COVID positive in last 90 days:  PCP - Devoria Maffucci, MD/Richard Jimmy, MD Cardiologist -   Chest x-ray - 07-21-23 Epic EKG - 07-21-23 Epic Stress Test -  ECHO - 07-20-23 Epic Cardiac Cath -  Pacemaker/ICD device last checked: Spinal Cord Stimulator:  Bowel Prep - N/A  Sleep Study -  CPAP -   Fasting Blood Sugar -  N/A Checks Blood Sugar _____ times a day  Last dose of GLP1 agonist-  N/A GLP1 instructions:  Hold 7 days before surgery    Last dose of SGLT-2 inhibitors-  N/A SGLT-2 instructions:  Hold 3 days before surgery   Blood Thinner Instructions:  Time Aspirin Instructions: Last Dose:  Activity level:  Paraplegic requires hoyer lift, resides at [facility]   Anesthesia review:  MVP, MS, paraplegia  Patient denies shortness of breath, fever, cough and chest pain at PAT appointment  Patient verbalized understanding of instructions that were given to them at the PAT appointment. Patient was also instructed that they will need to review over the PAT instructions again at home before surgery.

## 2023-12-28 NOTE — Progress Notes (Signed)
 Preop instructions for:     [name] Date of Birth:        08-28-2046               Date of Procedure:   01-05-24 Procedure: Cystoscopy, L ureteroscopy with stent placement Surgeon: Dr. Norleen Seltzer Facility contact:  [facility]    Phone:   [phone]               RN contact name/phone#:                          and Fax #: [phone]   Transportation contact phone#:  [facility] [phone] or [phone]  Please send day of procedure:  Current med list  Medications taken the day of procedure (return attached form to hospital) confirm time of nothing by mouth status (return attached form to hospital) Patient Demographic info( to include DNR status, problem list, allergies) Bring Insurance card and picture ID    Time to arrive at [facility]: 6:45 am   Report to: Admitting (On your left hand side)    Do not eat solid food or drink liquids past midnight the night before your procedure.(To include any tube feedings-must be discontinued)  Take these morning medications only with sips of water.(or give through gastrostomy or feeding tube).  Methenamine , Tylenol  if needed  Stop all vitamins and herbal supplements 7 days before surgery.   Note: No Insulin or Diabetic meds should be given or taken the morning of the procedure!  Oral Hygiene is also important to reduce your risk of infection.                                    Remember - BRUSH YOUR TEETH THE MORNING OF SURGERY WITH YOUR REGULAR TOOTHPASTE   DENTURES WILL BE REMOVED PRIOR TO SURGERY PLEASE DO NOT APPLY Poly grip OR ADHESIVES!!!  Leave all jewelry and other valuables at place where living( no metal or rings to be worn) No contact lens Women-no make-up, no lotions,perfumes,powders Men-no colognes,lotions   Any questions day of procedure,call  SHORT [phone]     Sent from :[facility]                   Phone:[phone]                   Fax:[phone]    Sent by :   Fronie BROCKS            RN

## 2023-12-29 ENCOUNTER — Encounter (HOSPITAL_COMMUNITY)
Admission: RE | Admit: 2023-12-29 | Discharge: 2023-12-29 | Disposition: A | Discharge status: Home / Self Care | Payer: Medicare Other | Source: Ambulatory Visit | Attending: Urology | Admitting: Urology

## 2023-12-29 DIAGNOSIS — Z01812 Encounter for preprocedural laboratory examination: Secondary | ICD-10-CM | POA: Diagnosis present

## 2023-12-29 DIAGNOSIS — N289 Disorder of kidney and ureter, unspecified: Secondary | ICD-10-CM | POA: Diagnosis not present

## 2023-12-29 DIAGNOSIS — D649 Anemia, unspecified: Secondary | ICD-10-CM | POA: Insufficient documentation

## 2023-12-29 LAB — CBC
HCT: 45.2 % (ref 36.0–46.0)
Hemoglobin: 14.1 g/dL (ref 12.0–15.0)
MCH: 29.7 pg (ref 26.0–34.0)
MCHC: 31.2 g/dL (ref 30.0–36.0)
MCV: 95.2 fL (ref 80.0–100.0)
Platelets: 466 10*3/uL — ABNORMAL HIGH (ref 150–400)
RBC: 4.75 MIL/uL (ref 3.87–5.11)
RDW: 12.8 % (ref 11.5–15.5)
WBC: 9.3 10*3/uL (ref 4.0–10.5)
nRBC: 0 % (ref 0.0–0.2)

## 2023-12-29 LAB — BASIC METABOLIC PANEL
Anion gap: 8 (ref 5–15)
BUN: 15 mg/dL (ref 8–23)
CO2: 26 mmol/L (ref 22–32)
Calcium: 8.8 mg/dL — ABNORMAL LOW (ref 8.9–10.3)
Chloride: 107 mmol/L (ref 98–111)
Creatinine, Ser: 0.43 mg/dL — ABNORMAL LOW (ref 0.44–1.00)
GFR, Estimated: >60 mL/min (ref >60)
Glucose, Bld: 98 mg/dL (ref 70–99)
Potassium: 4.2 mmol/L (ref 3.5–5.1)
Sodium: 141 mmol/L (ref 135–145)

## 2023-12-29 NOTE — Patient Instructions (Signed)
[place] - Preparing for Surgery Before surgery, you can play an important role.  Because skin is not sterile, your skin needs to be as free of germs as possible.  You can reduce the number of germs on your skin by washing with CHG (chlorahexidine gluconate) soap before surgery.  CHG is an antiseptic cleaner which kills germs and bonds with the skin to continue killing germs even after washing. Please DO NOT use if you have an allergy to CHG or antibacterial soaps.  If your skin becomes reddened/irritated stop using the CHG and inform your nurse when you arrive at Short Stay. Do not shave (including legs and underarms) for at least 48 hours prior to the first CHG shower.  You may shave your face/neck.  Please follow these instructions carefully:  1.  Shower with CHG Soap the night before surgery and the  morning of surgery.  2.  If you choose to wash your hair, wash your hair first as usual with your normal  shampoo.  3.  After you shampoo, rinse your hair and body thoroughly to remove the shampoo.                             4.  Use CHG as you would any other liquid soap.  You can apply chg directly to the skin and wash.  Gently with a scrungie or clean washcloth.  5.  Apply the CHG Soap to your body ONLY FROM THE NECK DOWN.   Do   not use on face/ open                           Wound or open sores. Avoid contact with eyes, ears mouth and   genitals (private parts).                       Wash face,  Genitals (private parts) with your normal soap.             6.  Wash thoroughly, paying special attention to the area where your    surgery  will be performed.  7.  Thoroughly rinse your body with warm water from the neck down.  8.  DO NOT shower/wash with your normal soap after using and rinsing off the CHG Soap.                9.  Pat yourself dry with a clean towel.            10.  Wear clean pajamas.            11.  Place clean sheets on your bed the night of your first shower and do not  sleep  with pets. Day of Surgery : Do not apply any lotions/deodorants the morning of surgery.  Please wear clean clothes to the hospital/surgery center.  FAILURE TO FOLLOW THESE INSTRUCTIONS MAY RESULT IN THE CANCELLATION OF YOUR SURGERY  PATIENT SIGNATURE_________________________________  NURSE SIGNATURE__________________________________  ________________________________________________________________________

## 2024-01-04 NOTE — H&P (Signed)
I have kidney stones.  HPI: [name] is a 78 year-old female established patient who is here for renal calculi.    12/14/23: Mr. [name] returns today in f/u. She had a CT in 9/24 that showed a 7mm LLP stone with some dilation of a lower calyx. She has had some pain. She has had no UTI symptoms or hematuria. A renal US today shows a 7mm left mid renal stone without obstruction. The stone has grown significantly since 9/23.   10/20/2022: Ms. [name] is a 78 year old female with past medical history of MS and nephrolithiasis. She is wheelchair-bound and uses a Nurse, adult for ambulation. 1 week ago, she underwent a right-sided ureteroscopy with a tethered stent placement. She has not been successful in removal of her tethered stent. Her surgery was uncomplicated. She endorses continued gross hematuria and urinary leakage. She denies fevers, chills, dysuria. She has some small left-sided renal stones.    [name] is a 78 yo female who saw Dr. Sherron Monday on 8/31 for right hydro noted on a CT chest. She cancelled the CT that was ordered by Dr. Sherron Monday because she didn't want dye. She has known stones and had been seen by Dr. Marlou Porch in the past. She has some pain on the right side when she gets rolled over. She has MS but voids normally. she has had no hematuria or dysuria but hasn't had a UA in 2 years.     ALLERGIES: Contrast Dye Lidocaine HCl (PF) SOLN - Other Reaction, "had to be walked X 1 hr" Novocain SOLN - Skin Rash    MEDICATIONS: Tylenol     GU PSH: Catheterization For Collection Of Specimen, Single Patient, All Places Of Service - 09/08/2022 Catheterize For Residual - 09/08/2022 Cystoscopy - 2017 Cystoscopy Ureteroscopy - 1969 Hysterectomy Unilat SO - 2007 Locm 300-399Mg /Ml Iodine,1Ml - 2018 Ureteroscopic laser litho - 10/07/2022       PSH Notes: partial kidney blockage    NON-GU PSH: Back Surgery (Unspecified) Cholecystectomy (laparoscopic) - 09/14/2023 Leg surgery  (unspecified) Nasal Sinus Therapy Shoulder Surgery (Unspecified) Tonsillectomy..     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 I discussed options for management including observation, URS and ESWL and will get her set up for URS with an overnight stay since she doesn't have 24 hour caregivers. I have reviewed the risks of ureteroscopy including bleeding, infection, ureteral injury, need for a stent or secondary procedures, thrombotic events and anesthetic complications. - 09/08/2022 Chronic cystitis (w/o hematuria) - 08/14/2022, - 2018, - 2017, Chronic cystitis, - 2016 Hydronephrosis - 08/14/2022 Gross hematuria - 2017, (Acute), Culture urine pending from 12/03/16 drop off specimen. Will empirically SMZ-TMP DS 1 po BID X 7 days till culture complete. Will proceed with hematuria workup with CT and cysto. Pt refused foley and bladder irrigation and refuses to schedule CT until after cysto and OV w/Dr. [name], - 2017, Gross hematuria, - 2014    NON-GU PMH: Encounter for general adult medical examination without abnormal findings, Encounter for preventive health examination - 2016 Unspecified atrial fibrillation, Atrial Fibrillation - 2014 Arthritis Cardiac murmur, unspecified    FAMILY HISTORY: 1 - Son, Daughter Deceased - Father, Mother Gall stones - Father Kidney Stones - Father nephrolithiasis - Father stroke - Father, Mother   SOCIAL HISTORY: Marital Status: Widowed  Preferred Language: [place]; Ethnicity: Not Hispanic Or Latino; Race: White Current Smoking Status: Patient has never smoked.  Does not use smokeless tobacco. Has never drank.  Does not drink caffeine.    REVIEW OF SYSTEMS:    GU Review Female:   Patient denies frequent urination, hard  to postpone urination, burning /pain with urination, get up at night to urinate, leakage of urine, stream starts and stops, trouble starting your stream, have to strain to urinate, and being pregnant.  Gastrointestinal (Upper):   Patient denies nausea, vomiting, and indigestion/ heartburn.  Gastrointestinal (Lower):   Patient denies diarrhea and constipation.  Constitutional:   Patient denies fever, night sweats, weight loss, and fatigue.  Skin:   Patient denies skin rash/ lesion and itching.  Eyes:   Patient denies blurred vision and double vision.  Ears/ Nose/ Throat:   Patient denies sore throat and sinus problems.  Hematologic/Lymphatic:   Patient denies swollen glands and easy bruising.  Cardiovascular:   Patient denies leg swelling and chest pains.  Respiratory:   Patient denies cough and shortness of breath.  Endocrine:   Patient denies excessive thirst.  Musculoskeletal:   Patient denies back pain and joint pain.  Neurological:   Patient denies headaches and dizziness.  Psychologic:   Patient denies depression and anxiety.   VITAL SIGNS: None   MULTI-SYSTEM PHYSICAL EXAMINATION:    Constitutional: Well-nourished. No physical deformities. Normally developed. Good grooming. kyphosis. Wheelchair bound.   Respiratory: Normal breath sounds. No labored breathing, no use of accessory muscles.   Cardiovascular: Regular rate and rhythm. No murmur, no gallop.      Complexity of Data:  Records Review:   Previous Patient Records  X-Ray Review: KUB: Reviewed Films. Discussed With Patient.  C.T. Chest/ Abd/Pelvis: Reviewed Films. Reviewed Report. Discussed With Patient.     PROCEDURES:         Renal Ultrasound - 43329  Right Kidney: Length: 9.1 cm Depth: 6.3 cm Cortical Width: 1.5 cm Width: 4.8 cm  Left Kidney: Length: 9.6 cm Depth: 4.7 cm Cortical Width: 1.3 cm Width: 4.1 cm  Left Kidney/Ureter:  Non-obstructing calcification MP.  Right Kidney/Ureter:  Within Normal Limits    Bladder:  PVR 68.31 ml      Decreased visualization due to pt being scanned in wheelchair.  Patient confirmed No Neulasta OnPro Device.           Visit Complexity - G2211 Chronic management   ASSESSMENT:      ICD-10 Details  1 GU:   Renal calculus - N20.0 Left, Chronic, Worsening - She has an enlarging left mid renal stone that had some obstruction of a lower calyx on CT. She needs stone removal and I have recommended ureteroscopy. Her body habitus is poor for ESWL.   I have reviewed the risks of ureteroscopy including bleeding, infection, ureteral injury, need for a stent or secondary procedures, thrombotic events and anesthetic complications.    2   Chronic cystitis (w/o hematuria) - N30.20 Minor - She has no symptoms but couldn't get a UA. She will have a cath UA and culture done at her facility so we can treat prior to surgery.    PLAN:           Schedule Labs: 1 Week - Urinalysis w/Scope    1 Week - CULTURE, URINE  Lab Notes: to be done at [facility] with [facility].   Return Visit/Planned Activity: 2 Weeks - Schedule Surgery  Procedure: Unspecified Date - Cysto Uretero Lithotripsy -  16109 Notes: Next available.           Document Letter(s):  Created for Patient: Clinical Summary         Notes:   CC: Dr. Tillman Abide and Dr. Maximino Sarin with [facility]. Marland Kitchen         Next Appointment:      Next Appointment: 01/05/2024 10:00 AM    Appointment Type: Surgery     Location: [facility] [phone]    Provider: Bjorn Pippin, M.D.    Reason for Visit: OP--WL--Cy, Left URS, HLL \\T \ Stent

## 2024-01-05 ENCOUNTER — Ambulatory Visit (HOSPITAL_COMMUNITY): Payer: Medicare Other | Admitting: Physician Assistant

## 2024-01-05 ENCOUNTER — Ambulatory Visit (HOSPITAL_COMMUNITY): Payer: Medicare Other

## 2024-01-05 ENCOUNTER — Ambulatory Visit (HOSPITAL_COMMUNITY)
Admission: RE | Admit: 2024-01-05 | Discharge: 2024-01-05 | Disposition: A | Discharge status: Skilled Nursing Facility | Payer: Medicare Other | Source: Ambulatory Visit | Attending: Urology | Admitting: Urology

## 2024-01-05 ENCOUNTER — Ambulatory Visit (HOSPITAL_BASED_OUTPATIENT_CLINIC_OR_DEPARTMENT_OTHER): Payer: Medicare Other | Admitting: Physician Assistant

## 2024-01-05 ENCOUNTER — Encounter (HOSPITAL_COMMUNITY)
Admission: RE | Disposition: A | Discharge status: Skilled Nursing Facility | Payer: Self-pay | Source: Ambulatory Visit | Attending: Urology

## 2024-01-05 ENCOUNTER — Encounter (HOSPITAL_COMMUNITY): Payer: Self-pay | Admitting: Urology

## 2024-01-05 DIAGNOSIS — D649 Anemia, unspecified: Secondary | ICD-10-CM

## 2024-01-05 DIAGNOSIS — N3021 Other chronic cystitis with hematuria: Secondary | ICD-10-CM | POA: Insufficient documentation

## 2024-01-05 DIAGNOSIS — Z993 Dependence on wheelchair: Secondary | ICD-10-CM | POA: Insufficient documentation

## 2024-01-05 DIAGNOSIS — N132 Hydronephrosis with renal and ureteral calculous obstruction: Secondary | ICD-10-CM | POA: Diagnosis present

## 2024-01-05 DIAGNOSIS — N200 Calculus of kidney: Secondary | ICD-10-CM | POA: Diagnosis not present

## 2024-01-05 DIAGNOSIS — G35 Multiple sclerosis: Secondary | ICD-10-CM | POA: Diagnosis not present

## 2024-01-05 DIAGNOSIS — N289 Disorder of kidney and ureter, unspecified: Secondary | ICD-10-CM

## 2024-01-05 HISTORY — DX: Hyperlipidemia, unspecified: E78.5

## 2024-01-05 HISTORY — DX: Spinal stenosis, cervical region: M48.02

## 2024-01-05 HISTORY — PX: CYSTOSCOPY/URETEROSCOPY/HOLMIUM LASER/STENT PLACEMENT: SHX6546

## 2024-01-05 HISTORY — DX: Paraplegia, unspecified: G82.20

## 2024-01-05 HISTORY — DX: Benign paroxysmal vertigo, unspecified ear: H81.10

## 2024-01-05 SURGERY — CYSTOSCOPY/URETEROSCOPY/HOLMIUM LASER/STENT PLACEMENT
Anesthesia: General | Laterality: Left

## 2024-01-05 MED ORDER — GENTAMICIN SULFATE 40 MG/ML IJ SOLN
5.0000 mg/kg | Freq: Once | INTRAVENOUS | Status: AC
  Administered 2024-01-05: 310 mg via INTRAVENOUS
  Filled 2024-01-05: qty 7.75

## 2024-01-05 MED ORDER — OXYCODONE HCL 5 MG PO TABS: 5.0000 mg | ORAL_TABLET | ORAL | Status: DC | PRN

## 2024-01-05 MED ORDER — SODIUM CHLORIDE 0.9 % IR SOLN
Status: DC | PRN
  Administered 2024-01-05: 3000 mL via INTRAVESICAL

## 2024-01-05 MED ORDER — ONDANSETRON HCL 4 MG/2ML IJ SOLN
INTRAMUSCULAR | Status: DC | PRN
  Administered 2024-01-05 (×2): 4 mg via INTRAVENOUS

## 2024-01-05 MED ORDER — LIDOCAINE HCL (PF) 2 % IJ SOLN
INTRAMUSCULAR | Status: AC
  Filled 2024-01-05: qty 5

## 2024-01-05 MED ORDER — FENTANYL CITRATE (PF) 100 MCG/2ML IJ SOLN
INTRAMUSCULAR | Status: AC
  Filled 2024-01-05: qty 2

## 2024-01-05 MED ORDER — SODIUM CHLORIDE 0.9% FLUSH: 3.0000 mL | INTRAVENOUS | Status: DC | PRN

## 2024-01-05 MED ORDER — PROPOFOL 10 MG/ML IV BOLUS
INTRAVENOUS | Status: DC | PRN
  Administered 2024-01-05: 120 mg via INTRAVENOUS

## 2024-01-05 MED ORDER — CEFAZOLIN SODIUM-DEXTROSE 2-4 GM/100ML-% IV SOLN
2.0000 g | INTRAVENOUS | Status: AC
  Administered 2024-01-05: 2 g via INTRAVENOUS
  Filled 2024-01-05: qty 100

## 2024-01-05 MED ORDER — ACETAMINOPHEN 650 MG RE SUPP: 650.0000 mg | RECTAL | Status: DC | PRN

## 2024-01-05 MED ORDER — SODIUM CHLORIDE 0.9 % IV SOLN
250.0000 mL | INTRAVENOUS | Status: DC | PRN
Start: 2024-01-05 — End: 2024-01-05

## 2024-01-05 MED ORDER — MORPHINE SULFATE (PF) 2 MG/ML IV SOLN: 2.0000 mg | INTRAVENOUS | Status: DC | PRN

## 2024-01-05 MED ORDER — ACETAMINOPHEN 325 MG PO TABS: 650.0000 mg | ORAL_TABLET | ORAL | Status: DC | PRN

## 2024-01-05 MED ORDER — LIDOCAINE 2% (20 MG/ML) 5 ML SYRINGE
INTRAMUSCULAR | Status: DC | PRN
  Administered 2024-01-05: 60 mg via INTRAVENOUS

## 2024-01-05 MED ORDER — LACTATED RINGERS IV SOLN: INTRAVENOUS | Status: DC

## 2024-01-05 MED ORDER — CHLORHEXIDINE GLUCONATE 0.12 % MT SOLN
15.0000 mL | Freq: Once | OROMUCOSAL | Status: AC
  Administered 2024-01-05: 15 mL via OROMUCOSAL

## 2024-01-05 MED ORDER — DEXAMETHASONE SODIUM PHOSPHATE 10 MG/ML IJ SOLN
INTRAMUSCULAR | Status: AC
  Filled 2024-01-05: qty 1

## 2024-01-05 MED ORDER — ORAL CARE MOUTH RINSE: 15.0000 mL | Freq: Once | OROMUCOSAL | Status: AC

## 2024-01-05 MED ORDER — EPHEDRINE SULFATE-NACL 50-0.9 MG/10ML-% IV SOSY
PREFILLED_SYRINGE | INTRAVENOUS | Status: DC | PRN
  Administered 2024-01-05 (×3): 5 mg via INTRAVENOUS
  Administered 2024-01-05: 10 mg via INTRAVENOUS

## 2024-01-05 MED ORDER — ONDANSETRON HCL 4 MG/2ML IJ SOLN
INTRAMUSCULAR | Status: AC
Start: 2024-01-05 — End: ?
  Filled 2024-01-05: qty 2

## 2024-01-05 MED ORDER — CEPHALEXIN 500 MG PO CAPS: 500.0000 mg | ORAL_CAPSULE | Freq: Three times a day (TID) | ORAL | 0 refills | Status: AC

## 2024-01-05 MED ORDER — PROPOFOL 10 MG/ML IV BOLUS
INTRAVENOUS | Status: AC
  Filled 2024-01-05: qty 20

## 2024-01-05 MED ORDER — SODIUM CHLORIDE 0.9% FLUSH: 3.0000 mL | Freq: Two times a day (BID) | INTRAVENOUS | Status: DC

## 2024-01-05 MED ORDER — DEXAMETHASONE SODIUM PHOSPHATE 10 MG/ML IJ SOLN
INTRAMUSCULAR | Status: DC | PRN
  Administered 2024-01-05: 10 mg via INTRAVENOUS

## 2024-01-05 MED ORDER — HYDROCODONE-ACETAMINOPHEN 5-325 MG PO TABS: 1.0000 | ORAL_TABLET | Freq: Four times a day (QID) | ORAL | 0 refills | Status: AC | PRN

## 2024-01-05 MED ORDER — PHENYLEPHRINE 80 MCG/ML (10ML) SYRINGE FOR IV PUSH (FOR BLOOD PRESSURE SUPPORT)
PREFILLED_SYRINGE | INTRAVENOUS | Status: DC | PRN
  Administered 2024-01-05 (×3): 160 ug via INTRAVENOUS

## 2024-01-05 MED ORDER — FENTANYL CITRATE (PF) 100 MCG/2ML IJ SOLN
INTRAMUSCULAR | Status: DC | PRN
  Administered 2024-01-05: 50 ug via INTRAVENOUS
  Administered 2024-01-05 (×2): 25 ug via INTRAVENOUS

## 2024-01-05 SURGICAL SUPPLY — 21 items
BAG URO CATCHER STRL LF (MISCELLANEOUS) ×2 IMPLANT
BASKET STONE NCOMPASS (UROLOGICAL SUPPLIES) IMPLANT
CATH URETERAL DUAL LUMEN 10F (MISCELLANEOUS) IMPLANT
CATH URETL OPEN 5X70 (CATHETERS) IMPLANT
CLOTH BEACON ORANGE TIMEOUT ST (SAFETY) ×2 IMPLANT
EXTRACTOR STONE NITINOL NGAGE (UROLOGICAL SUPPLIES) IMPLANT
GLOVE SURG SS PI 8.0 STRL IVOR (GLOVE) ×2 IMPLANT
GOWN SPEC L4 XLG W/TWL (GOWN DISPOSABLE) ×2 IMPLANT
GUIDEWIRE STR DUAL SENSOR (WIRE) ×2 IMPLANT
IV NS IRRIG 3000ML ARTHROMATIC (IV SOLUTION) ×2 IMPLANT
KIT TURNOVER KIT A (KITS) IMPLANT
LASER FIB FLEXIVA PULSE ID 365 (Laser) IMPLANT
LASER FIB FLEXIVA PULSE ID 550 (Laser) IMPLANT
LASER FIB FLEXIVA PULSE ID 910 (Laser) IMPLANT
MANIFOLD NEPTUNE II (INSTRUMENTS) ×2 IMPLANT
PACK CYSTO (CUSTOM PROCEDURE TRAY) ×2 IMPLANT
SHEATH NAVIGATOR HD 11/13X36 (SHEATH) IMPLANT
STENT URET 6FRX22 CONTOUR (STENTS) IMPLANT
TRACTIP FLEXIVA PULS ID 200XHI (Laser) IMPLANT
TUBING CONNECTING 10 (TUBING) ×2 IMPLANT
TUBING UROLOGY SET (TUBING) ×2 IMPLANT

## 2024-01-05 NOTE — Anesthesia Procedure Notes (Signed)
Procedure Name: LMA Insertion Date/Time: 01/05/2024 9:43 AM  Performed by: Floraine Buechler D, CRNAPre-anesthesia Checklist: Patient identified, Emergency Drugs available, Suction available and Patient being monitored Patient Re-evaluated:Patient Re-evaluated prior to induction Oxygen Delivery Method: Circle system utilized Preoxygenation: Pre-oxygenation with 100% oxygen Induction Type: IV induction Ventilation: Mask ventilation without difficulty LMA: LMA inserted LMA Size: 4.0 Tube type: Oral Number of attempts: 1 Placement Confirmation: positive ETCO2 and breath sounds checked- equal and bilateral Tube secured with: Tape Dental Injury: Teeth and Oropharynx as per pre-operative assessment

## 2024-01-05 NOTE — Op Note (Signed)
Procedure: 1.  Cystoscopy with left ureteroscopy with holmium laser application and stone extraction and placement of left double-J stent. 2.  Application of fluoroscopy.  Preop diagnosis: Left renal stones.  Postop diagnosis: Same.  Surgeon: Dr. Bjorn Pippin.  Anesthesia: General.  Specimen: 1.  Stone fragments. 2.  Urine culture.  Drains: 6 French by 22 cm contour double-J stent on the left with tether.  EBL: None.  Complications: None.  Indications: The patient is a 78 year old female who has a left lower pole stone that were obstructing the lower calyx and were felt to require removal.  She had an culture that just grew multiple species in the preop..  Procedure: She was taken operating room where she was given Ancef.  General anesthetic was induced.  She was placed in lithotomy position and fitted with PAS hose.  Her perineum and genitalia were prepped with Betadine solution she was draped in usual sterile fashion.  Cystoscopy was performed using a 21 [place] scope and 30 degree lens.  Examination revealed a normal urethra.  The bladder wall was smooth but there was some patchy erythema consistent with inflammation so I did send a urine culture from the bladder for completeness.  I also requested a dose of gentamicin to broaden her antibiotic coverage.  The ureteral orifices were unremarkable.  A sensor wire was then advanced to the kidney under fluoroscopic guidance and the cystoscope was removed.  The inner core of a 36 cm 11/13 [place] digital access sheath was then passed over the wire to the level of the UPJ.  This was then followed by the assembled sheath which easily passed.  The inner core and wire were then removed.  The dual-lumen digital flexible scope was then advanced through the sheath into the kidney into a somewhat flat tan stones were noted in the renal pelvis.  Inspection of the upper mid and lower calyces revealed no additional stones.  There was 1 calyx in the lower  pole with a stenotic infundibulum but I could visualize the space proximal to the stenosis and no stones were noted there.  A 242 m holmium laser fiber was then passed and the stone was broken into manageable fragments.  The stone fragments were then removed using an engage basket leaving only verifying grip and dust.  Once all stone fragments been removed the ureteroscope was removed.  A sensor wire was then replaced to the kidney through the sheath and the sheath was removed.  The cystoscope was reinserted over the wire and a 6 [place] by 22 cm contour double-J stent was passed to the left kidney under fluoroscopic guidance.  The wire is removed, good coil in the kidney and a good coil in the bladder.  The bladder was drained and cystoscope was removed leaving the stent string exiting urethra.  The string was tied close to the meatus then trimmed to an appropriate length before being tucked vaginally and a tampon string fashion.  She was taken down from lithotomy position, her anesthetic was reversed and she was moved recovery in stable condition.  There were no complications.

## 2024-01-05 NOTE — Anesthesia Preprocedure Evaluation (Addendum)
Anesthesia Evaluation  Patient identified by MRN, date of birth, ID band Patient awake    Reviewed: Allergy & Precautions, NPO status , Patient's Chart, lab work & pertinent test results  Airway Mallampati: II  TM Distance: >3 FB Neck ROM: Full    Dental  (+) Teeth Intact, Dental Advisory Given   Pulmonary neg pulmonary ROS   breath sounds clear to auscultation       Cardiovascular negative cardio ROS + Valvular Problems/Murmurs MVP  Rhythm:Regular Rate:Normal     Neuro/Psych negative neurological ROS  negative psych ROS   GI/Hepatic negative GI ROS, Neg liver ROS,,,  Endo/Other  negative endocrine ROS    Renal/GU Renal disease     Musculoskeletal  (+) Arthritis ,    Abdominal   Peds  Hematology  (+) Blood dyscrasia, anemia   Anesthesia Other Findings   Reproductive/Obstetrics                             Anesthesia Physical Anesthesia Plan  ASA: 2  Anesthesia Plan: General   Post-op Pain Management: Tylenol PO (pre-op)*   Induction: Intravenous  PONV Risk Score and Plan: 4 or greater and Ondansetron, Dexamethasone and Treatment may vary due to age or medical condition  Airway Management Planned: LMA  Additional Equipment: None  Intra-op Plan:   Post-operative Plan: Extubation in OR  Informed Consent:   Plan Discussed with: CRNA  Anesthesia Plan Comments:        Anesthesia Quick Evaluation

## 2024-01-05 NOTE — Anesthesia Postprocedure Evaluation (Signed)
Anesthesia Post Note  Patient: [name]  Procedure(s) Performed: CYSTOSCOPY/ LEFT URETEROSCOPY/HOLMIUM LASER/STENT PLACEMENT (Left)     Patient location during evaluation: PACU Anesthesia Type: General Level of consciousness: awake and alert Pain management: pain level controlled Vital Signs Assessment: post-procedure vital signs reviewed and stable Respiratory status: spontaneous breathing, nonlabored ventilation, respiratory function stable and patient connected to nasal cannula oxygen Cardiovascular status: blood pressure returned to baseline and stable Postop Assessment: no apparent nausea or vomiting Anesthetic complications: no  No notable events documented.  Last Vitals:  Vitals:   01/05/24 1115 01/05/24 1130  BP: 115/61 100/81  Pulse: 73 70  Resp: 14 13  Temp:  36.4 C  SpO2: 95% 94%    Last Pain:  Vitals:   01/05/24 1130  TempSrc:   PainSc: 0-No pain                 Shelton Silvas

## 2024-01-05 NOTE — Interval H&P Note (Signed)
History and Physical Interval Note: A urine culture on 12/17/23 was negative and she has no UTI symptoms at this time.   01/05/2024 9:19 AM  [name]  has presented today for surgery, with the diagnosis of LEFT RENAL STONE.  The various methods of treatment have been discussed with the patient and family. After consideration of risks, benefits and other options for treatment, the patient has consented to  Procedure(s) with comments: CYSTOSCOPY/ LEFT URETEROSCOPY/HOLMIUM LASER/STENT PLACEMENT (Left) - 60 MINUTE CASE as a surgical intervention.  The patient's history has been reviewed, patient examined, no change in status, stable for surgery.  I have reviewed the patient's chart and labs.  Questions were answered to the patient's satisfaction.     Bjorn Pippin

## 2024-01-05 NOTE — Interval H&P Note (Signed)
History and Physical Interval Note:  01/05/2024 8:38 AM  [name]  has presented today for surgery, with the diagnosis of LEFT RENAL STONE.  The various methods of treatment have been discussed with the patient and family. After consideration of risks, benefits and other options for treatment, the patient has consented to  Procedure(s) with comments: CYSTOSCOPY/ LEFT URETEROSCOPY/HOLMIUM LASER/STENT PLACEMENT (Left) - 60 MINUTE CASE as a surgical intervention.  The patient's history has been reviewed, patient examined, no change in status, stable for surgery.  I have reviewed the patient's chart and labs.  Questions were answered to the patient's satisfaction.     Bjorn Pippin

## 2024-01-05 NOTE — Discharge Instructions (Addendum)
You may remove the stent by pulling the tether that is tucked vaginally.  If you don't feel you can do that, please call the office to arrange to have it removed.

## 2024-01-05 NOTE — Transfer of Care (Signed)
Immediate Anesthesia Transfer of Care Note  Patient: [name]  Procedure(s) Performed: CYSTOSCOPY/ LEFT URETEROSCOPY/HOLMIUM LASER/STENT PLACEMENT (Left)  Patient Location: PACU  Anesthesia Type:General  Level of Consciousness: awake, alert , and oriented  Airway & Oxygen Therapy: Patient Spontanous Breathing and Patient connected to face mask oxygen  Post-op Assessment: Report given to RN and Post -op Vital signs reviewed and stable  Post vital signs: Reviewed and stable  Last Vitals:  Vitals Value Taken Time  BP 125/63 01/05/24 1041  Temp    Pulse 77 01/05/24 1043  Resp 18 01/05/24 1043  SpO2 99 % 01/05/24 1043  Vitals shown include unfiled device data.  Last Pain:  Vitals:   01/05/24 0711  TempSrc:   PainSc: 0-No pain         Complications: No notable events documented.

## 2024-01-06 ENCOUNTER — Encounter (HOSPITAL_COMMUNITY): Payer: Self-pay | Admitting: Urology

## 2024-01-07 LAB — URINE CULTURE: Culture: >=100000 — AB

## 2024-01-26 ENCOUNTER — Telehealth: Payer: Self-pay | Admitting: Internal Medicine

## 2024-01-26 NOTE — Telephone Encounter (Signed)
Left message to return call to our office.  Per Dr. Alphonsus Sias patient has not been seen in over a year. They can either reach out to social work at the hospital to see if they can compleat or we can put in referral to our social worker. And the last option would need to be seen by Dr. Alphonsus Sias in office. This will be a 30 min app.

## 2024-01-26 NOTE — Telephone Encounter (Signed)
Copied from CRM [id number]. Topic: General - Other >> Jan 26, 2024  8:44 AM Gurney Maxin H wrote: Reason for CRM: Patient called in stating she needs a FL2 completed today by 3pm by her provider her Medicaid is pending. Please send to email below and include medicaid pending number.  Medicaid Pending Number [id number] T Email: [email or web address]   [name] [phone]

## 2024-01-28 ENCOUNTER — Ambulatory Visit: Payer: Medicare Other | Admitting: Internal Medicine

## 2024-01-28 NOTE — Telephone Encounter (Signed)
The phone number in the message was her son's number. He said the FL2 was done by [facility].

## 2024-01-31 ENCOUNTER — Other Ambulatory Visit: Payer: Self-pay | Admitting: Internal Medicine

## 2024-02-14 ENCOUNTER — Other Ambulatory Visit: Payer: Self-pay | Admitting: Internal Medicine
